# Patient Record
Sex: Female | Born: 1975
Health system: Southern US, Community
[De-identification: ages and names within clinical notes are randomized; demographics above are authoritative.]

## PROBLEM LIST (undated history)

## (undated) DIAGNOSIS — F909 Attention-deficit hyperactivity disorder, unspecified type: Secondary | ICD-10-CM

## (undated) DIAGNOSIS — R569 Unspecified convulsions: Secondary | ICD-10-CM

## (undated) DIAGNOSIS — F1010 Alcohol abuse, uncomplicated: Secondary | ICD-10-CM

## (undated) DIAGNOSIS — Z8639 Personal history of other endocrine, nutritional and metabolic disease: Secondary | ICD-10-CM

## (undated) DIAGNOSIS — E119 Type 2 diabetes mellitus without complications: Secondary | ICD-10-CM

## (undated) DIAGNOSIS — R51 Headache: Secondary | ICD-10-CM

## (undated) DIAGNOSIS — N970 Female infertility associated with anovulation: Secondary | ICD-10-CM

## (undated) DIAGNOSIS — R519 Headache, unspecified: Secondary | ICD-10-CM

## (undated) DIAGNOSIS — Z8669 Personal history of other diseases of the nervous system and sense organs: Secondary | ICD-10-CM

## (undated) DIAGNOSIS — O2620 Pregnancy care for patient with recurrent pregnancy loss, unspecified trimester: Secondary | ICD-10-CM

## (undated) DIAGNOSIS — Z5189 Encounter for other specified aftercare: Secondary | ICD-10-CM

## (undated) DIAGNOSIS — F419 Anxiety disorder, unspecified: Secondary | ICD-10-CM

## (undated) DIAGNOSIS — F32A Depression, unspecified: Secondary | ICD-10-CM

## (undated) DIAGNOSIS — G40909 Epilepsy, unspecified, not intractable, without status epilepticus: Secondary | ICD-10-CM

## (undated) DIAGNOSIS — E669 Obesity, unspecified: Secondary | ICD-10-CM

## (undated) DIAGNOSIS — T8859XA Other complications of anesthesia, initial encounter: Secondary | ICD-10-CM

## (undated) DIAGNOSIS — R109 Unspecified abdominal pain: Secondary | ICD-10-CM

## (undated) DIAGNOSIS — D649 Anemia, unspecified: Secondary | ICD-10-CM

## (undated) DIAGNOSIS — E039 Hypothyroidism, unspecified: Secondary | ICD-10-CM

## (undated) DIAGNOSIS — N390 Urinary tract infection, site not specified: Secondary | ICD-10-CM

## (undated) DIAGNOSIS — N96 Recurrent pregnancy loss: Secondary | ICD-10-CM

## (undated) DIAGNOSIS — T7840XA Allergy, unspecified, initial encounter: Secondary | ICD-10-CM

## (undated) DIAGNOSIS — J45909 Unspecified asthma, uncomplicated: Secondary | ICD-10-CM

## (undated) DIAGNOSIS — F329 Major depressive disorder, single episode, unspecified: Secondary | ICD-10-CM

## (undated) HISTORY — DX: Unspecified abdominal pain: R10.9

## (undated) HISTORY — PX: WISDOM TOOTH EXTRACTION: SHX21

## (undated) HISTORY — DX: Headache: R51

## (undated) HISTORY — DX: Obesity, unspecified: E66.9

## (undated) HISTORY — DX: Personal history of other endocrine, nutritional and metabolic disease: Z86.39

## (undated) HISTORY — DX: Pregnancy care for patient with recurrent pregnancy loss, unspecified trimester: O26.20

## (undated) HISTORY — DX: Alcohol abuse, uncomplicated: F10.10

## (undated) HISTORY — DX: Allergy, unspecified, initial encounter: T78.40XA

## (undated) HISTORY — DX: Anxiety disorder, unspecified: F41.9

## (undated) HISTORY — DX: Unspecified convulsions: R56.9

## (undated) HISTORY — DX: Encounter for other specified aftercare: Z51.89

## (undated) HISTORY — DX: Major depressive disorder, single episode, unspecified: F32.9

## (undated) HISTORY — PX: DILATION AND CURETTAGE OF UTERUS: SHX78

## (undated) HISTORY — DX: Recurrent pregnancy loss: N96

## (undated) HISTORY — DX: Hypothyroidism, unspecified: E03.9

## (undated) HISTORY — DX: Female infertility associated with anovulation: N97.0

## (undated) HISTORY — DX: Depression, unspecified: F32.A

## (undated) HISTORY — DX: Urinary tract infection, site not specified: N39.0

---

## 2008-08-10 ENCOUNTER — Other Ambulatory Visit: Admission: RE | Admit: 2008-08-10 | Discharge: 2008-08-10 | Payer: Self-pay | Admitting: Obstetrics & Gynecology

## 2008-09-07 ENCOUNTER — Ambulatory Visit (HOSPITAL_COMMUNITY): Admission: RE | Admit: 2008-09-07 | Discharge: 2008-09-07 | Payer: Self-pay | Admitting: *Deleted

## 2010-02-08 ENCOUNTER — Ambulatory Visit (HOSPITAL_COMMUNITY): Admission: RE | Admit: 2010-02-08 | Discharge: 2010-02-08 | Payer: Self-pay | Admitting: Obstetrics and Gynecology

## 2010-06-14 ENCOUNTER — Ambulatory Visit (HOSPITAL_COMMUNITY): Payer: Self-pay | Admitting: Psychiatry

## 2010-06-15 ENCOUNTER — Ambulatory Visit (HOSPITAL_COMMUNITY): Payer: Self-pay | Admitting: Licensed Clinical Social Worker

## 2010-06-25 HISTORY — PX: GASTRIC BYPASS: SHX52

## 2010-06-26 ENCOUNTER — Ambulatory Visit (HOSPITAL_COMMUNITY): Payer: Self-pay | Admitting: Licensed Clinical Social Worker

## 2010-06-28 ENCOUNTER — Ambulatory Visit (HOSPITAL_COMMUNITY)
Admission: RE | Admit: 2010-06-28 | Discharge: 2010-06-28 | Payer: Self-pay | Source: Home / Self Care | Attending: Psychiatry | Admitting: Psychiatry

## 2010-07-06 ENCOUNTER — Ambulatory Visit (HOSPITAL_COMMUNITY)
Admission: RE | Admit: 2010-07-06 | Discharge: 2010-07-06 | Payer: Self-pay | Source: Home / Self Care | Attending: Licensed Clinical Social Worker | Admitting: Licensed Clinical Social Worker

## 2010-07-13 ENCOUNTER — Ambulatory Visit (HOSPITAL_COMMUNITY)
Admission: RE | Admit: 2010-07-13 | Discharge: 2010-07-13 | Payer: Self-pay | Source: Home / Self Care | Attending: Licensed Clinical Social Worker | Admitting: Licensed Clinical Social Worker

## 2010-07-20 ENCOUNTER — Ambulatory Visit (HOSPITAL_COMMUNITY)
Admission: RE | Admit: 2010-07-20 | Discharge: 2010-07-20 | Payer: Self-pay | Source: Home / Self Care | Attending: Licensed Clinical Social Worker | Admitting: Licensed Clinical Social Worker

## 2010-07-27 ENCOUNTER — Ambulatory Visit (HOSPITAL_COMMUNITY): Admit: 2010-07-27 | Payer: Self-pay | Admitting: Licensed Clinical Social Worker

## 2010-07-27 ENCOUNTER — Encounter (HOSPITAL_COMMUNITY): Payer: 59 | Admitting: Licensed Clinical Social Worker

## 2010-07-27 DIAGNOSIS — F332 Major depressive disorder, recurrent severe without psychotic features: Secondary | ICD-10-CM

## 2010-07-31 ENCOUNTER — Encounter (HOSPITAL_COMMUNITY): Payer: 59 | Admitting: Psychiatry

## 2010-07-31 DIAGNOSIS — F331 Major depressive disorder, recurrent, moderate: Secondary | ICD-10-CM

## 2010-08-03 ENCOUNTER — Encounter (HOSPITAL_COMMUNITY): Payer: 59 | Admitting: Licensed Clinical Social Worker

## 2010-08-03 DIAGNOSIS — F332 Major depressive disorder, recurrent severe without psychotic features: Secondary | ICD-10-CM

## 2010-08-10 ENCOUNTER — Encounter (HOSPITAL_COMMUNITY): Payer: 59 | Admitting: Licensed Clinical Social Worker

## 2010-08-10 DIAGNOSIS — F332 Major depressive disorder, recurrent severe without psychotic features: Secondary | ICD-10-CM

## 2010-08-18 ENCOUNTER — Encounter (HOSPITAL_COMMUNITY): Payer: 59 | Admitting: Licensed Clinical Social Worker

## 2010-08-18 DIAGNOSIS — F332 Major depressive disorder, recurrent severe without psychotic features: Secondary | ICD-10-CM

## 2010-08-21 ENCOUNTER — Encounter (HOSPITAL_COMMUNITY): Payer: 59 | Admitting: Psychiatry

## 2010-08-28 ENCOUNTER — Encounter (HOSPITAL_COMMUNITY): Payer: 59 | Admitting: Psychiatry

## 2010-08-28 DIAGNOSIS — F331 Major depressive disorder, recurrent, moderate: Secondary | ICD-10-CM

## 2010-08-31 ENCOUNTER — Encounter (HOSPITAL_COMMUNITY): Payer: 59 | Admitting: Licensed Clinical Social Worker

## 2010-08-31 DIAGNOSIS — F332 Major depressive disorder, recurrent severe without psychotic features: Secondary | ICD-10-CM

## 2010-09-08 ENCOUNTER — Encounter (HOSPITAL_COMMUNITY): Payer: 59 | Admitting: Licensed Clinical Social Worker

## 2010-09-08 DIAGNOSIS — F332 Major depressive disorder, recurrent severe without psychotic features: Secondary | ICD-10-CM

## 2010-09-11 ENCOUNTER — Encounter (HOSPITAL_COMMUNITY): Payer: 59 | Admitting: Psychiatry

## 2010-09-14 ENCOUNTER — Encounter (HOSPITAL_COMMUNITY): Payer: 59 | Admitting: Licensed Clinical Social Worker

## 2010-09-14 ENCOUNTER — Other Ambulatory Visit: Payer: Self-pay | Admitting: Neurology

## 2010-09-14 DIAGNOSIS — G40802 Other epilepsy, not intractable, without status epilepticus: Secondary | ICD-10-CM

## 2010-09-14 DIAGNOSIS — F332 Major depressive disorder, recurrent severe without psychotic features: Secondary | ICD-10-CM

## 2010-09-18 ENCOUNTER — Other Ambulatory Visit: Payer: 59

## 2010-09-21 ENCOUNTER — Encounter (HOSPITAL_COMMUNITY): Payer: 59 | Admitting: Licensed Clinical Social Worker

## 2010-09-21 DIAGNOSIS — F332 Major depressive disorder, recurrent severe without psychotic features: Secondary | ICD-10-CM

## 2010-09-25 ENCOUNTER — Encounter (HOSPITAL_COMMUNITY): Payer: 59 | Admitting: Psychiatry

## 2010-09-25 DIAGNOSIS — F332 Major depressive disorder, recurrent severe without psychotic features: Secondary | ICD-10-CM

## 2010-09-29 ENCOUNTER — Encounter (HOSPITAL_COMMUNITY): Payer: 59 | Admitting: Licensed Clinical Social Worker

## 2010-10-02 ENCOUNTER — Ambulatory Visit
Admission: RE | Admit: 2010-10-02 | Discharge: 2010-10-02 | Disposition: A | Discharge status: Home / Self Care | Payer: 59 | Source: Ambulatory Visit | Attending: Neurology | Admitting: Neurology

## 2010-10-02 DIAGNOSIS — G40802 Other epilepsy, not intractable, without status epilepticus: Secondary | ICD-10-CM

## 2010-10-02 MED ORDER — GADOBENATE DIMEGLUMINE 529 MG/ML IV SOLN: 20.0000 mL | Freq: Once | INTRAVENOUS | Status: AC | PRN

## 2010-10-06 ENCOUNTER — Encounter (HOSPITAL_COMMUNITY): Payer: 59 | Admitting: Licensed Clinical Social Worker

## 2010-10-06 DIAGNOSIS — F332 Major depressive disorder, recurrent severe without psychotic features: Secondary | ICD-10-CM

## 2010-10-12 ENCOUNTER — Encounter (HOSPITAL_COMMUNITY): Payer: 59 | Admitting: Licensed Clinical Social Worker

## 2010-10-12 DIAGNOSIS — F332 Major depressive disorder, recurrent severe without psychotic features: Secondary | ICD-10-CM

## 2010-10-19 ENCOUNTER — Encounter (HOSPITAL_COMMUNITY): Payer: 59 | Admitting: Licensed Clinical Social Worker

## 2010-10-19 DIAGNOSIS — F332 Major depressive disorder, recurrent severe without psychotic features: Secondary | ICD-10-CM

## 2010-10-23 ENCOUNTER — Encounter (HOSPITAL_COMMUNITY): Payer: 59 | Admitting: Psychiatry

## 2010-10-26 ENCOUNTER — Encounter (HOSPITAL_COMMUNITY): Payer: 59 | Admitting: Licensed Clinical Social Worker

## 2010-10-26 DIAGNOSIS — F332 Major depressive disorder, recurrent severe without psychotic features: Secondary | ICD-10-CM

## 2010-10-30 ENCOUNTER — Encounter (HOSPITAL_COMMUNITY): Payer: 59 | Admitting: Psychiatry

## 2010-10-30 DIAGNOSIS — F331 Major depressive disorder, recurrent, moderate: Secondary | ICD-10-CM

## 2010-11-03 ENCOUNTER — Encounter (HOSPITAL_COMMUNITY): Payer: 59 | Admitting: Licensed Clinical Social Worker

## 2010-11-06 ENCOUNTER — Encounter (HOSPITAL_COMMUNITY): Payer: 59 | Admitting: Licensed Clinical Social Worker

## 2010-11-06 DIAGNOSIS — F332 Major depressive disorder, recurrent severe without psychotic features: Secondary | ICD-10-CM

## 2010-11-09 ENCOUNTER — Encounter (HOSPITAL_COMMUNITY): Payer: 59 | Admitting: Licensed Clinical Social Worker

## 2010-11-09 DIAGNOSIS — F332 Major depressive disorder, recurrent severe without psychotic features: Secondary | ICD-10-CM

## 2010-11-10 NOTE — Letter (Signed)
September 08, 2008    Faylene Kurtz, M.D.  [address]  [place] [address]   RE:   [name], [name]  MR#   [id number]  ACC#        [id number]   MFM CONSULTATION REPORT   Dear Dr. Russella Dar:   I first want to thank you for referring your patient, Lisa [name], for  a pre-pregnancy consultation secondary to her history of epilepsy.  As  you know, Lisa [name] is a 35 year old gravida [name], Lisa [name] who has  had epilepsy since 5.  My information about her epilepsy diagnosis is  through the maternal history and that I did not have any of her previous  neurology notes.  It is my understanding that she has not been closely  followed by a neurologist for a number of years due to the fact that her  seizure disorder has been very well controlled.  She was diagnosed with  grand mal and petit mal seizures back in 1992 when she was a high school  student and has been on Depakote and has been well controlled since that  time.  The patient reports that she has had only 1 seizure since 2004  but has not been tried off her Depakote.  She would like to undertake a  pregnancy within the next year and is presenting to obtain information  considering the use of Depakote versus other antiepileptic medications  throughout the pregnancy.   PAST MEDICAL HISTORY:  1. Epilepsy as outlined above.  [name]. Hypothyroidism, currently on Synthroid.  3. Borderline diabetes.   The patient has a strong family history of diabetes, randomly checks her  blood sugars and has noted occasional random blood sugars that have been  elevated.   Epilepsy:  1. I had a long discussion with the patient concerning the general      management of patients with epilepsy during pregnancy.      Specifically, I talked about the use of Depakote and the risk of      Depakote use during the pregnancies.  Specifically, I discussed the      1-[name]% chance of a neural tube defect in women taking valproic acid      during the pregnancy.  In  addition, I discussed recent data that      shows that there could be decreased cognitive function in children      whose mothers took Depakote throughout the pregnancy.  Therefore,      due to the fact that the patient has not had frequent seizures, I      think it would be advisable either to try her off her all      antiepileptic medications and/or switch her to a different      antiepileptic medication prior to conceiving.  It is of note that      she currently does take 4 mg of folic acid which is a recommended      dose to prevent neural tube defects in women taking valproic acid.      I was reassuring to her that if the seizure disorder is well      controlled, past majority of women with epilepsy have successful      pregnancy outcome.  [name]. Hypothyroidism.  She appears to be well replaced on her Synthroid      therapy, and I discussed the recommendation to continue the      Synthroid during the pregnancy.  3. Borderline diabetes.  It is my understanding that she is attempting      weight loss and increased exercise, in attempt to better control      her blood sugars.  I did arrange for her to fax her blood sugar      records to [place], so that we can look at those to ensure that she has      good glycemia control prior to undertaking any pregnancy.   Due to my recommendations concerning switching her antiepileptic  medications, I am referring her to Dr. Rolene Arbour in the Department of  Neurology at [facility] for management of her antiepileptic medication  and for consideration of either trying her off of all antiepileptics or  changing her to a different antiepileptic medication.   Thank you for referring Lisa [name] to me for a prenatal consultation  concerning her maternal history of epilepsy.  If you have any questions  concerning my recommendations, please feel free to contact me.   Sincerely,   Sincerely,      Felipa Eth, MD      DCM/MEDQ  D:  09/08/2008  T:   09/09/2008  Job:  [id number]   cc:   M. Leda Quail, MD  Fax: [phone]

## 2010-11-15 ENCOUNTER — Encounter (HOSPITAL_COMMUNITY): Payer: 59 | Admitting: Licensed Clinical Social Worker

## 2010-11-17 ENCOUNTER — Encounter (HOSPITAL_COMMUNITY): Payer: 59 | Admitting: Licensed Clinical Social Worker

## 2010-11-23 ENCOUNTER — Encounter (HOSPITAL_COMMUNITY): Payer: 59 | Admitting: Licensed Clinical Social Worker

## 2010-11-23 DIAGNOSIS — F332 Major depressive disorder, recurrent severe without psychotic features: Secondary | ICD-10-CM

## 2010-11-30 ENCOUNTER — Encounter (HOSPITAL_BASED_OUTPATIENT_CLINIC_OR_DEPARTMENT_OTHER): Payer: 59 | Admitting: Licensed Clinical Social Worker

## 2010-11-30 DIAGNOSIS — F332 Major depressive disorder, recurrent severe without psychotic features: Secondary | ICD-10-CM

## 2010-12-07 ENCOUNTER — Encounter (HOSPITAL_COMMUNITY): Payer: 59 | Admitting: Licensed Clinical Social Worker

## 2010-12-07 DIAGNOSIS — F332 Major depressive disorder, recurrent severe without psychotic features: Secondary | ICD-10-CM

## 2010-12-11 ENCOUNTER — Encounter (HOSPITAL_COMMUNITY): Payer: 59 | Admitting: Psychiatry

## 2010-12-14 ENCOUNTER — Encounter (HOSPITAL_COMMUNITY): Payer: 59 | Admitting: Licensed Clinical Social Worker

## 2010-12-14 DIAGNOSIS — F332 Major depressive disorder, recurrent severe without psychotic features: Secondary | ICD-10-CM

## 2010-12-21 ENCOUNTER — Encounter (HOSPITAL_COMMUNITY): Payer: 59 | Admitting: Licensed Clinical Social Worker

## 2010-12-21 DIAGNOSIS — F332 Major depressive disorder, recurrent severe without psychotic features: Secondary | ICD-10-CM

## 2010-12-28 ENCOUNTER — Encounter (HOSPITAL_COMMUNITY): Payer: 59 | Admitting: Licensed Clinical Social Worker

## 2010-12-28 DIAGNOSIS — F332 Major depressive disorder, recurrent severe without psychotic features: Secondary | ICD-10-CM

## 2011-01-04 ENCOUNTER — Encounter (HOSPITAL_COMMUNITY): Payer: 59 | Admitting: Licensed Clinical Social Worker

## 2011-01-04 DIAGNOSIS — F332 Major depressive disorder, recurrent severe without psychotic features: Secondary | ICD-10-CM

## 2011-01-11 ENCOUNTER — Encounter (HOSPITAL_COMMUNITY): Payer: 59 | Admitting: Licensed Clinical Social Worker

## 2011-01-11 DIAGNOSIS — F332 Major depressive disorder, recurrent severe without psychotic features: Secondary | ICD-10-CM

## 2011-01-18 ENCOUNTER — Encounter (HOSPITAL_COMMUNITY): Payer: 59 | Admitting: Licensed Clinical Social Worker

## 2011-01-18 DIAGNOSIS — F332 Major depressive disorder, recurrent severe without psychotic features: Secondary | ICD-10-CM

## 2011-01-25 ENCOUNTER — Encounter (HOSPITAL_COMMUNITY): Payer: 59 | Admitting: Licensed Clinical Social Worker

## 2011-01-25 DIAGNOSIS — F39 Unspecified mood [affective] disorder: Secondary | ICD-10-CM

## 2011-02-01 ENCOUNTER — Encounter (HOSPITAL_COMMUNITY): Payer: 59 | Admitting: Licensed Clinical Social Worker

## 2011-02-01 DIAGNOSIS — F332 Major depressive disorder, recurrent severe without psychotic features: Secondary | ICD-10-CM

## 2011-02-08 ENCOUNTER — Encounter (HOSPITAL_COMMUNITY): Payer: 59 | Admitting: Licensed Clinical Social Worker

## 2011-02-08 DIAGNOSIS — F332 Major depressive disorder, recurrent severe without psychotic features: Secondary | ICD-10-CM

## 2011-02-15 ENCOUNTER — Encounter (HOSPITAL_COMMUNITY): Payer: 59 | Admitting: Licensed Clinical Social Worker

## 2011-02-15 DIAGNOSIS — F332 Major depressive disorder, recurrent severe without psychotic features: Secondary | ICD-10-CM

## 2011-02-22 ENCOUNTER — Encounter (HOSPITAL_COMMUNITY): Payer: 59 | Admitting: Licensed Clinical Social Worker

## 2011-02-22 DIAGNOSIS — F332 Major depressive disorder, recurrent severe without psychotic features: Secondary | ICD-10-CM

## 2011-03-01 ENCOUNTER — Encounter (HOSPITAL_COMMUNITY): Payer: 59 | Admitting: Licensed Clinical Social Worker

## 2011-03-01 DIAGNOSIS — F332 Major depressive disorder, recurrent severe without psychotic features: Secondary | ICD-10-CM

## 2011-03-08 ENCOUNTER — Encounter (HOSPITAL_COMMUNITY): Payer: 59 | Admitting: Licensed Clinical Social Worker

## 2011-03-08 DIAGNOSIS — F332 Major depressive disorder, recurrent severe without psychotic features: Secondary | ICD-10-CM

## 2011-03-22 ENCOUNTER — Encounter (HOSPITAL_COMMUNITY): Payer: 59 | Admitting: Licensed Clinical Social Worker

## 2011-03-29 ENCOUNTER — Encounter (HOSPITAL_COMMUNITY): Payer: 59 | Admitting: Licensed Clinical Social Worker

## 2011-03-29 DIAGNOSIS — F332 Major depressive disorder, recurrent severe without psychotic features: Secondary | ICD-10-CM

## 2011-04-04 ENCOUNTER — Encounter (HOSPITAL_COMMUNITY): Payer: 59 | Admitting: Licensed Clinical Social Worker

## 2011-04-04 DIAGNOSIS — F332 Major depressive disorder, recurrent severe without psychotic features: Secondary | ICD-10-CM

## 2011-04-12 ENCOUNTER — Encounter (HOSPITAL_COMMUNITY): Payer: 59 | Admitting: Licensed Clinical Social Worker

## 2011-04-12 DIAGNOSIS — F332 Major depressive disorder, recurrent severe without psychotic features: Secondary | ICD-10-CM

## 2011-04-17 ENCOUNTER — Encounter (HOSPITAL_COMMUNITY): Payer: 59 | Admitting: Licensed Clinical Social Worker

## 2011-04-17 DIAGNOSIS — F332 Major depressive disorder, recurrent severe without psychotic features: Secondary | ICD-10-CM

## 2011-04-19 ENCOUNTER — Encounter (HOSPITAL_COMMUNITY): Payer: 59 | Admitting: Licensed Clinical Social Worker

## 2011-04-20 ENCOUNTER — Encounter (HOSPITAL_COMMUNITY): Payer: 59 | Admitting: Licensed Clinical Social Worker

## 2011-04-26 ENCOUNTER — Encounter (HOSPITAL_COMMUNITY): Payer: 59 | Admitting: Licensed Clinical Social Worker

## 2011-04-26 DIAGNOSIS — F332 Major depressive disorder, recurrent severe without psychotic features: Secondary | ICD-10-CM

## 2011-04-28 ENCOUNTER — Other Ambulatory Visit (HOSPITAL_COMMUNITY): Payer: Self-pay

## 2011-04-30 ENCOUNTER — Ambulatory Visit (HOSPITAL_COMMUNITY): Payer: 59 | Admitting: Psychiatry

## 2011-04-30 ENCOUNTER — Encounter (HOSPITAL_COMMUNITY): Payer: Self-pay | Admitting: Psychiatry

## 2011-04-30 DIAGNOSIS — F331 Major depressive disorder, recurrent, moderate: Secondary | ICD-10-CM

## 2011-04-30 MED ORDER — FLUOXETINE HCL 10 MG PO CAPS: ORAL_CAPSULE | ORAL | Status: DC

## 2011-04-30 MED ORDER — DIAZEPAM 5 MG PO TABS: ORAL_TABLET | ORAL | Status: DC

## 2011-04-30 NOTE — Progress Notes (Signed)
Patient came today for her appointment. She was last seen in May 2012. He had gastric surgery and she lost 60 pounds. Recently she stopped feeling depressed anxious with decreased energy. She's been more isolated withdrawn and reported anhedonia and some crying spells. Patient reported usually getting more depressed in the winter and fall season. She like to restart antidepressant. She is currently taking no antidepressant. Is taking little from difficulty however she is not confirmed about the dosage in April her headache is still there but hoping with increase Lamictal they get better. She is also VALIUM and experiencing some time panic attack and like to go back on Valium. Patient is calm cooperative pleasant and maintains good eye contact she endorses mood is depressed and her affect was constricted. She denies any active or passive suicidal thoughts. There is no psychosis present. Her attention and concentration are okay. She denies any side effects of the medication. She is alert and oriented x3 and her insight judgment impulse control are okay. Assessment Maj. depressive disorder recurrent Plan restart Prozac 10 mg daily with slow titration to 20 mg daily in one week also provide 30 tablets of Valium 5 mg for severe anxiety and panic attack. She'll continue Lamictal from the neurologist. I have explained the risks and benefits of medication in detail. She will continue to see counselor on a regular basis for coping and social skills. I will see her again in 3 weeks. Time spent 30 minutes.

## 2011-05-03 ENCOUNTER — Encounter (HOSPITAL_COMMUNITY): Payer: Self-pay | Admitting: Licensed Clinical Social Worker

## 2011-05-03 ENCOUNTER — Ambulatory Visit (HOSPITAL_COMMUNITY): Payer: 59 | Admitting: Licensed Clinical Social Worker

## 2011-05-03 DIAGNOSIS — F332 Major depressive disorder, recurrent severe without psychotic features: Secondary | ICD-10-CM

## 2011-05-03 DIAGNOSIS — F4310 Post-traumatic stress disorder, unspecified: Secondary | ICD-10-CM

## 2011-05-04 NOTE — Progress Notes (Signed)
   THERAPIST PROGRESS NOTE  Session Time: 3:00pm-3:50pm  Participation Level: Active  Behavioral Response: Well GroomedAlertAnxious, Depressed and Irritable  Type of Therapy: Individual Therapy  Treatment Goals addressed: Anxiety, Coping and Diagnosis: depression  Interventions: CBT, Strength-based, Supportive and Reframing  Summary: Lisa [name] is a 35 y.o. female who presents with depressed mood and flat affect. She endorses and appears exhausted and reports continued insomnia. She has woken up twice over the past week with flashbacks and is unable to go back to sleep after this happens. 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 She repots taking a break from working intensely at her job and on occasion has left work early to spend time with her dog at the park. Patent demonstrates improvement in her willingness and ability to focus on life balance. She reports poor motivation and has not been exercising due to feelings of vulnerability, but she has focused on a solution for this which involves her father and brother accompanying her to the gym. Stress in her marriage is low as her husband continues to make changes in his behavior. As a result patient is more relaxed at home. Her weight has now reached a plateau due to no exercise. Her appetite is wnr.   Suicidal/Homicidal: Nowithout intent/plan  Therapist Response: Assisted patient with the expression of her feelings of depression, frustration about her medication and anger towards some members of her family. Her negative self talk is strong today when she describes her frustration with her irrational thoughts. She calls herself "stupid" for having irrational thoughts and uses this language as a means to "snap herself out of it". Dicussed the negative consequences related to using disparaging negative self statement. Patient is  open to feedback, but remains cynical during the process. Used CBT to assist patient with the identification of her negative distortions and irrational thoughts. Encouraged patient to verbalize alternative and factual responses which challenge her distortions.  Reviewed patients self care plan. Assessed her progress related to self care. Patient's self care needs improvement. Recommend proper diet, regular exercise, socialization and recreation. Patient denies suicidal and homicidal ideation, intent or plan.  Plan: Return again in 1 weeks.  Diagnosis: Axis I: Major Depression, Rec and PTSD    Axis II: No diagnosis    Nicole Hafley, LCSW 05/04/2011

## 2011-05-10 ENCOUNTER — Ambulatory Visit (HOSPITAL_COMMUNITY): Payer: 59 | Admitting: Licensed Clinical Social Worker

## 2011-05-10 ENCOUNTER — Encounter (HOSPITAL_COMMUNITY): Payer: Self-pay | Admitting: Licensed Clinical Social Worker

## 2011-05-10 DIAGNOSIS — F332 Major depressive disorder, recurrent severe without psychotic features: Secondary | ICD-10-CM

## 2011-05-10 DIAGNOSIS — F4310 Post-traumatic stress disorder, unspecified: Secondary | ICD-10-CM
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
## 2011-05-23 ENCOUNTER — Ambulatory Visit (HOSPITAL_COMMUNITY): Payer: 59 | Admitting: Psychiatry

## 2011-05-30 ENCOUNTER — Ambulatory Visit (HOSPITAL_COMMUNITY): Payer: 59 | Admitting: Psychiatry

## 2011-06-04 ENCOUNTER — Ambulatory Visit (HOSPITAL_COMMUNITY): Payer: 59 | Admitting: Psychiatry

## 2011-06-04 DIAGNOSIS — F331 Major depressive disorder, recurrent, moderate: Secondary | ICD-10-CM

## 2011-06-04 MED ORDER — DIAZEPAM 5 MG PO TABS: ORAL_TABLET | ORAL | Status: DC

## 2011-06-04 MED ORDER — FLUOXETINE HCL 10 MG PO CAPS: ORAL_CAPSULE | ORAL | Status: DC

## 2011-06-04 NOTE — Progress Notes (Signed)
Patient came today for her appointment. She is taking Prozac 20 mg and reported no side effects. She continued to endorse depression and anxiety and hoping increased was a Prozac to help her. She is sleeping better and take Valium 5 mg half to one tablet as needed. She still has residual crying spells Recently she has upper respiratory infection taking antibiotic. She reached a plateau on her weight loss and stays on 195 pounds. She's been more isolated withdrawn and reported anhedonia and some crying spells.  Mental status examination  Patient is calm cooperative pleasant and maintains good eye contact she appears tired due to cold symptoms and taking antibiotic. she endorses mood is depressed and her affect was constricted. She denies any active or passive suicidal thoughts. There is no psychosis present. Her attention and concentration are okay. She denies any side effects of the medication. She is alert and oriented x3 and her insight judgment impulse control are okay.  Assessment Maj. depressive disorder recurrent  Plan  I will increase her Prozac to 30 mg daily and continue to provide Valium 5 mg half to 1 tablet as needed for anxiety. If patient continued shows limited improvement with the Prozac we will consider Cymbalta on next visit She'll continue Lamictal from the neurologist. She told she is seeing Dr. Pearlean Brownie at [facility] into for neurology and her Lamictal dose is 500 mg. I have explained the risks and benefits of medication in detail. She will continue to see counselor on a regular basis for coping and social skills. I will see her again in 3 weeks.

## 2011-06-28 ENCOUNTER — Ambulatory Visit (HOSPITAL_COMMUNITY): Payer: 59 | Admitting: Licensed Clinical Social Worker

## 2011-07-05 ENCOUNTER — Encounter (HOSPITAL_COMMUNITY): Payer: 59 | Admitting: Licensed Clinical Social Worker

## 2011-07-12 ENCOUNTER — Ambulatory Visit (INDEPENDENT_AMBULATORY_CARE_PROVIDER_SITE_OTHER): Payer: 59 | Admitting: Licensed Clinical Social Worker

## 2011-07-12 ENCOUNTER — Encounter (HOSPITAL_COMMUNITY): Payer: Self-pay | Admitting: Licensed Clinical Social Worker

## 2011-07-12 DIAGNOSIS — F332 Major depressive disorder, recurrent severe without psychotic features: Secondary | ICD-10-CM

## 2011-07-12 DIAGNOSIS — F4310 Post-traumatic stress disorder, unspecified: Secondary | ICD-10-CM

## 2011-07-12 NOTE — Progress Notes (Signed)
   THERAPIST PROGRESS NOTE  Session Time: 4:00pm-4:50pm  Participation Level: Active  Behavioral Response: Well GroomedAlertDepressed, Dysphoric and Hopeless  Type of Therapy: Individual Therapy  Treatment Goals addressed: Anxiety, Communication: with husband, Coping and Diagnosis: depression and PTSD.   Interventions: CBT, Motivational Interviewing, Strength-based and Supportive  Summary: Lisa [name] is a 36 y.o. female who presents with depressed mood and flat affect. This is the first session in two months since this therapist has been out on leave. Patient discussed her visits with her old therapist and reports that they did not make much progress. She reports continued severe depression, continued anxiety with panic attacks and flashbacks. She is uncomfortable with additional weight loss and intentionally stopped loosing weight. She feels vulnerable and exposed already and does not want to increase those feelings by loosing more weight. She hates her job and is being black balled by HR. She describes being rejected for a job before the posting closed. She is hopeless when talking about her job and what she can do about the problem. She continues to exercise, but has discomfort in the gym. Her husband is treating her well, but she continues to hold onto secrets about her past, PTSD symptoms and her discomfort with sex. Her sleep is inconsistent and her appetite is wnl.    Suicidal/Homicidal: Nowithout intent/plan  Therapist Response: Patient's depression, anxiety and PTSD show no improvement over the past month. She is dsyphoric, hopeless and defensive when offered alternative ways of considering a problem. Processed w/pt her struggle to communicate her feelings to her husband. Explored her negative and distorted thoughts, used CBT and reframing to assist with increased insight. Patient is resistant towards the idea of being more honest with her husband. Her thinking is rigid and negative.  Will continue to focus on honesty as a means to decrease her exposure to traumatic events, such as sex. Used CBT to assist patient with the identification of her negative distortions and irrational thoughts. Encouraged patient to verbalize alternative and factual responses which challenge her distortions. Used motivational interviewing to assist and encourage patient through the change process. Explored patients barriers to change. Reviewed patients self care plan. Assessed her progress related to self care. Patient's self care is adequate and can use improvement. Recommend proper diet, regular exercise, socialization and recreation.   Plan: Return again in one weeks.  Diagnosis: Axis I: Major Depression, Recurrent severe and Post Traumatic Stress Disorder    Axis II: Deferred    Emmalina Espericueta, LCSW 07/12/2011

## 2011-07-13 ENCOUNTER — Ambulatory Visit (INDEPENDENT_AMBULATORY_CARE_PROVIDER_SITE_OTHER): Payer: 59 | Admitting: Licensed Clinical Social Worker

## 2011-07-13 DIAGNOSIS — F4310 Post-traumatic stress disorder, unspecified: Secondary | ICD-10-CM

## 2011-07-13 DIAGNOSIS — F332 Major depressive disorder, recurrent severe without psychotic features: Secondary | ICD-10-CM

## 2011-07-13 NOTE — Progress Notes (Signed)
   THERAPIST PROGRESS NOTE  Session Time: 2:00-3:15pm  Participation Level: Active  Behavioral Response: Well GroomedLethargicAnxious, Depressed, Dysphoric, Hopeless and Worthless  Type of Therapy: Individual Therapy  Treatment Goals addressed: Anxiety, Coping and Diagnosis: depression, PTSD  Interventions: DBT, Solution Focused, Strength-based and Supportive  Summary: Lisa [name] is a 36 y.o. female who presents with depressed/anxious mood and flat affect. She called to request an appointment today after being seen yesterday. She talks softly and behaves timidly. She reports an increase in anxiety, panic and desire to cut herself after her session yesterday. Discussed her desire to hurt herself and that last night she took 35mg  of Valium to knock herself out because none of her distraction techniques worked. reviewed her techniques and discussed alternative distraction tools. She is fearful of the idea of telling her husband the truth about her flashbacks and PTSD. She speaks negatively about herself, calling her inability to mange her PTSD symptoms "stupid and pathetic". As her anxiety increases, she begins to shake and have a flashback.    Suicidal/Homicidal: Nowithout intent/plan  Therapist Response: Patient is struggling with distraction, management of her depression and anxiety. Explored her negative and distorted thoughts about the results of being honest with her husband. Used CBT to assist patient with the identification of her negative distortions and irrational thoughts. Encouraged patient to verbalize alternative and factual responses which challenge her distortions. Used DBT to assist patient with distraction ideas, increase her mindfulness in session and assigning her homework. Recommend patient purchase the DBT workbook. Assisted patent with problem solving and creating a plan of distraction. Used Mazy the therapy dog at the end of the session to ground patient in reality an assist  her to leave the session. Patient is able to contract for safety.   Plan: Return again in one weeks.  Diagnosis: Axis I: Major Depression, Recurrent severe and Post Traumatic Stress Disorder    Axis II: Deferred    Uchenna Seufert, LCSW 07/13/2011

## 2011-07-16 ENCOUNTER — Ambulatory Visit (INDEPENDENT_AMBULATORY_CARE_PROVIDER_SITE_OTHER): Payer: 59 | Admitting: Psychiatry

## 2011-07-16 DIAGNOSIS — F331 Major depressive disorder, recurrent, moderate: Secondary | ICD-10-CM

## 2011-07-16 MED ORDER — DIAZEPAM 5 MG PO TABS: ORAL_TABLET | ORAL | Status: DC

## 2011-07-16 MED ORDER — FLUOXETINE HCL 40 MG PO CAPS: 40.0000 mg | ORAL_CAPSULE | Freq: Every day | ORAL | Status: DC

## 2011-07-16 NOTE — Progress Notes (Signed)
Patient came today for her appointment. She is now taking Prozac 30 mg and reported no side effects. She has noticed some improvement in her depression and she is less anxious during the day however she continues to have anxiety racing thoughts and panic attack at night. She still requires Valium half to 1 tablet as needed for extreme anxiety. She is wondering if she can try Prozac 40 mg for a better response. She still have some residual crying spells and anhedonia but hopelessness and helplessness but she denies any active or passive suicidal thoughts. She is able to lost some weight and feel happy about it.  She is seeing therapist on a regular basis for increase coping and social skills.   Mental status examination  Patient is calm cooperative pleasant and maintains good eye contact. She appears tired but overall her affect is improved from the past. Her speech is slow but clear and coherent. She denies any active or passive suicidal thoughts. There is no psychosis present. Her attention and concentration are okay. She denies any side effects of the medication. She is alert and oriented x3 and her insight judgment impulse control are okay.  Assessment Maj. depressive disorder recurrent  Plan  I will increase her Prozac to 40 mg daily and continue to provide Valium 5 mg half to 1 tablet as needed for anxiety.  I have explained risks and benefits of medication in detail. She's also taking Lamictal 500 mg from the neurologist. I have explained the risks and benefits of medication in detail. She will continue to see counselor on a regular basis for coping and social skills. I will see her again in 4 weeks.

## 2011-07-19 ENCOUNTER — Encounter (HOSPITAL_COMMUNITY): Payer: Self-pay | Admitting: Licensed Clinical Social Worker

## 2011-07-19 ENCOUNTER — Ambulatory Visit (INDEPENDENT_AMBULATORY_CARE_PROVIDER_SITE_OTHER): Payer: 59 | Admitting: Licensed Clinical Social Worker

## 2011-07-19 DIAGNOSIS — F332 Major depressive disorder, recurrent severe without psychotic features: Secondary | ICD-10-CM

## 2011-07-19 DIAGNOSIS — F4310 Post-traumatic stress disorder, unspecified: Secondary | ICD-10-CM
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
## 2011-07-20 ENCOUNTER — Encounter (HOSPITAL_COMMUNITY): Payer: Self-pay | Admitting: Licensed Clinical Social Worker

## 2011-07-20 ENCOUNTER — Ambulatory Visit (INDEPENDENT_AMBULATORY_CARE_PROVIDER_SITE_OTHER): Payer: 59 | Admitting: Licensed Clinical Social Worker

## 2011-07-20 DIAGNOSIS — F4310 Post-traumatic stress disorder, unspecified: Secondary | ICD-10-CM

## 2011-07-20 DIAGNOSIS — F332 Major depressive disorder, recurrent severe without psychotic features: Secondary | ICD-10-CM
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
## 2011-07-26 ENCOUNTER — Encounter (HOSPITAL_COMMUNITY): Payer: Self-pay | Admitting: Licensed Clinical Social Worker

## 2011-07-26 ENCOUNTER — Ambulatory Visit (INDEPENDENT_AMBULATORY_CARE_PROVIDER_SITE_OTHER): Payer: 59 | Admitting: Licensed Clinical Social Worker

## 2011-07-26 DIAGNOSIS — F332 Major depressive disorder, recurrent severe without psychotic features: Secondary | ICD-10-CM

## 2011-07-26 DIAGNOSIS — F4310 Post-traumatic stress disorder, unspecified: Secondary | ICD-10-CM

## 2011-07-26 NOTE — Progress Notes (Signed)
   THERAPIST PROGRESS NOTE  Session Time: 3:00pm-3:50pm  Participation Level: Active  Behavioral Response: Well GroomedAlertEuthymic  Type of Therapy: Individual Therapy  Treatment Goals addressed: Anxiety, Coping and Diagnosis: depression  Interventions: CBT, Motivational Interviewing and Supportive  Summary: Lisa [name] is a 36 y.o. female who presents with euthymic mood and affect. She reports having a functional split after her session last week and describes seeing a hallway with a coat hanging up which contained her depression and anxiety. She reports having a wonderful four days, without depression or anxiety. She was active, able to sleep 8 hours, able to have sex with her husband and enjoy it and interact with friends and family. This lasted until Tuesday when she reports that her depression and anxiety returned, but not as severe. She met with her previous therapist to discuss her experience and discusses her thoughts on this. Patient did not identify any personality in this experience. She is trying to take the coat off again, but can't figure out how to do this.    Suicidal/Homicidal: Nowithout intent/plan  Therapist Response: Patient is noticeably different today, with an upbeat mood and the ability to laugh. She looks well rested and is wearing makeup. Processed w/pt her feelings about this experience, which she endorses as weird. She is fearful that this therapist will decide not to work with her, because she is too "wierd". Provided feedback and reassurance. Used CBT to assist patient with the identification of her negative distortions and irrational thoughts. Encouraged patient to verbalize alternative and factual responses which challenge her distortions. Used this experience as a means help motivate patient to continue more adaptive behaviors. Used motivational interviewing to assist and encourage patient through the change process. Explored patients barriers to change.  Reviewed patients self care plan. Assessed her progress related to self care. Patient's self care is fair. Recommend proper diet, regular exercise, socialization and recreation.   Plan: Return again in one weeks.  Diagnosis: Axis I: Major Depression, Recurrent severe    Axis II: Deferred    [name], LCSW 07/26/2011

## 2011-08-01 ENCOUNTER — Ambulatory Visit (INDEPENDENT_AMBULATORY_CARE_PROVIDER_SITE_OTHER): Payer: 59 | Admitting: Licensed Clinical Social Worker

## 2011-08-01 ENCOUNTER — Encounter (HOSPITAL_COMMUNITY): Payer: Self-pay | Admitting: Licensed Clinical Social Worker

## 2011-08-01 DIAGNOSIS — F332 Major depressive disorder, recurrent severe without psychotic features: Secondary | ICD-10-CM

## 2011-08-01 DIAGNOSIS — F4310 Post-traumatic stress disorder, unspecified: Secondary | ICD-10-CM
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
## 2011-08-02 ENCOUNTER — Ambulatory Visit (INDEPENDENT_AMBULATORY_CARE_PROVIDER_SITE_OTHER): Payer: 59 | Admitting: Licensed Clinical Social Worker

## 2011-08-02 DIAGNOSIS — F4310 Post-traumatic stress disorder, unspecified: Secondary | ICD-10-CM

## 2011-08-02 DIAGNOSIS — F332 Major depressive disorder, recurrent severe without psychotic features: Secondary | ICD-10-CM

## 2011-08-03 ENCOUNTER — Encounter (HOSPITAL_COMMUNITY): Payer: Self-pay | Admitting: Licensed Clinical Social Worker

## 2011-08-03 NOTE — Progress Notes (Signed)
   THERAPIST PROGRESS NOTE  Session Time: 4:00pm-4:50pm  Participation Level: Active  Behavioral Response: Well GroomedAlertAnxious, Depressed, Hopeless and Irritable  Type of Therapy: Individual Therapy  Treatment Goals addressed: Anger, Anxiety, Coping and Diagnosis: depression  Interventions: CBT, DBT and Supportive  Summary: Lisa [name] is a 36 y.o. female who presents with depressed mood and flat affect. She is difficult to engage and reports not being able to sleep last night at all. She is barely functioning, admits it is a struggle to get out of bed, bath and go to work. When asked how she does this, she states that it is habit. She talks negatively about herself today, focusing on her belief that she is completely worthless, a piece of trash and a waste of space. She did think about what or whom she could put hope in and she believes she can put hope in this therapist and her old therapist. She is anxious, with panic when discusses her trauma. Her sleep and appetite are both poor.     Suicidal/Homicidal: Nowithout intent/plan  Therapist Response: Patients depression is worsening and she remains resistant in session. Discussed the need for a higher level of care based on her need for two sessions a week. Discussed and recommended Psych IOP, which patient argues about how this can be helpful for her since she is too anxious to be around people at this time. Educated patient on the benefits of group, she is unwilling to attend the group. She has not followed through with contacting a new psychiatrist. Told patient she must do this in order to continue treatment. Processed w/ patient her struggle to reconcile the "good" parts of her with the traumatically exposed areas. Used DBT to help develop distress tolerance. Patient struggles to remain focused on developing these skills today. Used motivational interviewing to assist and encourage patient through the change process. Explored patients  barriers to change. Reviewed patients self care plan. Assessed her progress related to self care. Patient's self care is poor. Recommend proper diet, regular exercise, socialization and recreation.   Plan: Return again in one weeks.  Diagnosis: Axis I: Major Depression, Recurrent severe and Post Traumatic Stress Disorder    Axis II: No diagnosis    Lisa Heinle, LCSW 08/03/2011

## 2011-08-09 ENCOUNTER — Ambulatory Visit (HOSPITAL_COMMUNITY): Payer: 59 | Admitting: Licensed Clinical Social Worker

## 2011-08-09 DIAGNOSIS — F332 Major depressive disorder, recurrent severe without psychotic features: Secondary | ICD-10-CM

## 2011-08-09 DIAGNOSIS — F4310 Post-traumatic stress disorder, unspecified: Secondary | ICD-10-CM
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
## 2011-08-10 DIAGNOSIS — R109 Unspecified abdominal pain: Secondary | ICD-10-CM

## 2011-08-10 HISTORY — DX: Unspecified abdominal pain: R10.9

## 2011-08-13 ENCOUNTER — Ambulatory Visit (HOSPITAL_COMMUNITY): Payer: 59 | Admitting: Psychiatry

## 2011-08-14 DIAGNOSIS — N390 Urinary tract infection, site not specified: Secondary | ICD-10-CM

## 2011-08-14 HISTORY — DX: Urinary tract infection, site not specified: N39.0

## 2011-08-16 ENCOUNTER — Ambulatory Visit (INDEPENDENT_AMBULATORY_CARE_PROVIDER_SITE_OTHER): Payer: 59 | Admitting: Licensed Clinical Social Worker

## 2011-08-16 DIAGNOSIS — F4310 Post-traumatic stress disorder, unspecified: Secondary | ICD-10-CM

## 2011-08-16 DIAGNOSIS — F332 Major depressive disorder, recurrent severe without psychotic features: Secondary | ICD-10-CM

## 2011-08-17 ENCOUNTER — Encounter (HOSPITAL_COMMUNITY): Payer: Self-pay | Admitting: Psychiatry

## 2011-08-17 ENCOUNTER — Ambulatory Visit (INDEPENDENT_AMBULATORY_CARE_PROVIDER_SITE_OTHER): Payer: 59 | Admitting: Psychiatry

## 2011-08-17 VITALS — BP 115/69 | HR 81 | Wt 177.0 lb

## 2011-08-17 DIAGNOSIS — F32A Depression, unspecified: Secondary | ICD-10-CM

## 2011-08-17 DIAGNOSIS — F331 Major depressive disorder, recurrent, moderate: Secondary | ICD-10-CM

## 2011-08-17 DIAGNOSIS — F3289 Other specified depressive episodes: Secondary | ICD-10-CM

## 2011-08-17 DIAGNOSIS — F329 Major depressive disorder, single episode, unspecified: Secondary | ICD-10-CM

## 2011-08-17 MED ORDER — DESVENLAFAXINE SUCCINATE ER 50 MG PO TB24: 50.0000 mg | ORAL_TABLET | Freq: Every day | ORAL | Status: DC

## 2011-08-17 MED ORDER — DIAZEPAM 5 MG PO TABS: ORAL_TABLET | ORAL | Status: DC
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
## 2011-08-23 ENCOUNTER — Ambulatory Visit (INDEPENDENT_AMBULATORY_CARE_PROVIDER_SITE_OTHER): Payer: 59 | Admitting: Licensed Clinical Social Worker

## 2011-08-23 DIAGNOSIS — F332 Major depressive disorder, recurrent severe without psychotic features: Secondary | ICD-10-CM

## 2011-08-23 DIAGNOSIS — F4310 Post-traumatic stress disorder, unspecified: Secondary | ICD-10-CM

## 2011-08-23 NOTE — Progress Notes (Deleted)
   THERAPIST PROGRESS NOTE  Session Time: 3:00pm-3:50pm  Participation Level: Active  Behavioral Response: Well GroomedAlertEuthymic  Type of Therapy: Individual Therapy  Treatment Goals addressed: Coping  Interventions: CBT, Strength-based and Supportive  Summary: Lisa [name] is a 36 y.o. female who presents with euthymic mood and affect. She reports feeling back to herself with a distinct improvement in her mood, which she attributes to a session with her old therapist, where she split and work was done to integrate the trauma. She feels good, has some concern and apprehension about her improvement, but is trying to trust the process of integrating the trauma fully. She feels "younger" and is concerned about how this may come across to others, especially at work. She is uncomfortable with her body and is trying to adjust to the changes from her weight loss. She is hopeful about her future and about becoming pregnant. Her sleep and appetite are wnl.   Suicidal/Homicidal: Nowithout intent/plan  Therapist Response: Processed w/pt her feelings of this dramatic change and her understanding of what has happened. Explored her discomfort with her body and helped her develop insight into her negative self talk around this issue. Used CBT to assist patient with the identification of her negative distortions and irrational thoughts. Encouraged patient to verbalize alternative and factual responses which challenge her distortions. Reviewed patients self care plan. Assessed her progress related to self care. Patient's self care is good. Recommend proper diet, regular exercise, socialization and recreation.   Plan: Return again in one weeks.  Diagnosis: Axis I: Major Depression, Recurrent severe and Post Traumatic Stress Disorder    Axis II: No diagnosis    Olof Marcil, LCSW 08/23/2011

## 2011-08-29 ENCOUNTER — Ambulatory Visit (HOSPITAL_COMMUNITY): Payer: Self-pay | Admitting: Licensed Clinical Social Worker

## 2011-08-30 ENCOUNTER — Ambulatory Visit (INDEPENDENT_AMBULATORY_CARE_PROVIDER_SITE_OTHER): Payer: 59 | Admitting: Licensed Clinical Social Worker

## 2011-08-30 ENCOUNTER — Ambulatory Visit (HOSPITAL_COMMUNITY): Payer: 59 | Admitting: Licensed Clinical Social Worker

## 2011-08-30 DIAGNOSIS — F332 Major depressive disorder, recurrent severe without psychotic features: Secondary | ICD-10-CM

## 2011-08-31 ENCOUNTER — Telehealth (HOSPITAL_COMMUNITY): Payer: Self-pay | Admitting: Licensed Clinical Social Worker
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
## 2011-08-31 NOTE — Progress Notes (Deleted)
   THERAPIST PROGRESS NOTE  Session Time: 3:00pm-3:50pm  Participation Level: Active  Behavioral Response: Well GroomedAlertDepressed and Irritable  Type of Therapy: Individual Therapy  Treatment Goals addressed: Coping  Interventions: CBT, DBT, Strength-based and Supportive  Summary: Lisa [name] is a 36 y.o. female who presents with depressed mood and irritable affect. She reports a return of her depression and anxiety,with a high degree of irritability. She reports mood lability and confusion over the lability. She is angry and highly irritable at work and at home. She is upset with her husband for his current depression and reaction to "small things" which she finds not worthy to be upset about. She is angry with her mothers constant attempts to convince patient that she should not have children because of her depression and her husbands temperament. She did confront her mother about how this feels, but has not confidence that this will change her mothers future behavior. She is in a "dark place" and endorses suicidal thoughts over the past week and admits to taking too much valium, but cannot tell this therapist how much. Her trigger for suicidal thought and intent is her ongoing depression and her hopelessness that it will "never end". She is able to contract for safety today and is able to discuss deterrents which are her family and her husband. She admits that she has not been honest with this therapist or her prior therapist about her suicidal ideation, intent or plan out of fear that she would be hospitalized and that we would be disappointed in her. Discussed hospitalization, which patient is strongly against, but accepts that if this therapist needed to hospitalize her, should would comply. At this time, she agrees to contact this therapist, her old therapist if this therapist is not available and go to the nearest emergency room. Plan is to touch base with patient tomorrow to assess  suicidality. Her sleep is poor and her appetite is wnl.  Patient wants to use a plan to contact this office if she needs an additional session by using key words which would alert this therapist to the severity of her need. She will use the work "panic" if she is anxious and could use a session, but can go without if there is no time. She will use the word "touchstone" to communicate that she is in a dangerous and dark place.    Suicidal/Homicidal: Nowithout intent/plan  Therapist Response: Processed with patient her feeling of depression, frustration and irritability. Provided feedback and encouragement. Processed her cognitive distortions leading to hopelessness and helplessness. Explored alternative ways of releasing her stress other than hurting herself, such as exercising, hiking and walking her dog. Used CBT to assist patient with the identification of her negative distortions and irrational thoughts. Encouraged patient to verbalize alternative and factual responses which challenge her distortions. Used DBT to help patient build distress tolerance skills. Reviewed patients self care plan. Assessed her progress related to self care. Patient's self care is fair. Recommend proper diet, regular exercise, socialization and recreation.  Will contact patient tomorrow and check in to assess safety. Patient is able to clearly communicate her safety plan and commits to use it.   Plan: Return again in one weeks.  Diagnosis: Axis I: Major Depression, Recurrent severe    Axis II: No diagnosis    Kaydan Wong, LCSW 08/31/2011

## 2011-08-31 NOTE — Telephone Encounter (Signed)
Called patient to check in and assess her mood and safety. She just woke up and was able to get some sleep. She feels positive about her plans to go to [place] this weekend. She will contact Erskine Squibb, her old therapist, if an emergency arises and she feels unsafe. She denies any SI, intent or plan. Recommend patient contact this therapist on Monday to check in. Patient agrees.

## 2011-08-31 NOTE — Progress Notes (Signed)
   THERAPIST PROGRESS NOTE  Session Time: THERAPIST PROGRESS NOTE  Session Time: 3:00pm-3:50pm  Participation Level: Active  Behavioral Response: Well GroomedAlertDepressed and Irritable  Type of Therapy: Individual Therapy  Treatment Goals addressed: Coping  Interventions: CBT, DBT, Strength-based and Supportive  Summary: Lisa [name] is a 36 y.o. female who presents with depressed mood and irritable affect. She reports a return of her depression and anxiety,with a high degree of irritability. She reports mood lability and confusion over the lability. She is angry and highly irritable at work and at home. She is upset with her husband for his current depression and reaction to "small things" which she finds not worthy to be upset about. She is angry with her mothers constant attempts to convince patient that she should not have children because of her depression and her husbands temperament. She did confront her mother about how this feels, but has not confidence that this will change her mothers future behavior. She is in a "dark place" and endorses suicidal thoughts over the past week and admits to taking too much valium, but cannot tell this therapist how much. Her trigger for suicidal thought and intent is her ongoing depression and her hopelessness that it will "never end". She is able to contract for safety today and is able to discuss deterrents which are her family and her husband. She admits that she has not been honest with this therapist or her prior therapist about her suicidal ideation, intent or plan out of fear that she would be hospitalized and that we would be disappointed in her. Discussed hospitalization, which patient is strongly against, but accepts that if this therapist needed to hospitalize her, should would comply. At this time, she agrees to contact this therapist, her old therapist if this therapist is not available and go to the nearest emergency room. Plan is to touch base  with patient tomorrow to assess suicidality. Her sleep is poor and her appetite is wnl. Patient wants to use a plan to contact this office if she needs an additional session by using key words which would alert this therapist to the severity of her need. She will use the work "panic" if she is anxious and could use a session, but can go without if there is no time. She will use the word "touchstone" to communicate that she is in a dangerous and dark place.  Suicidal/Homicidal: Nowithout intent/plan  Therapist Response: Processed with patient her feeling of depression, frustration and irritability. Provided feedback and encouragement. Processed her cognitive distortions leading to hopelessness and helplessness. Explored alternative ways of releasing her stress other than hurting herself, such as exercising, hiking and walking her dog. Used CBT to assist patient with the identification of her negative distortions and irrational thoughts. Encouraged patient to verbalize alternative and factual responses which challenge her distortions. Used DBT to help patient build distress tolerance skills. Reviewed patients self care plan. Assessed her progress related to self care. Patient's self care is fair. Recommend proper diet, regular exercise, socialization and recreation.  Will contact patient tomorrow and check in to assess safety. Patient is able to clearly communicate her safety plan and commits to use it.  Plan: Return again in one weeks.  Diagnosis: Axis I: Major Depression, Recurrent severe  Axis II: No diagnosis   Adrionna Delcid, LCSW 08/31/2011

## 2011-08-31 NOTE — Telephone Encounter (Signed)
Called patient to assess her safety. She is currently taking care of a friend who had surgery and reports an absence of SI, intent or plan. She plans to travel to her sisters in [place] for the weekend to de-stress and be distracted. She did not sleep well last night and is upset with her husband. She will be away from her husband this weekend, which is positive. Reviewed safety plan. Will touch base at end of day today.

## 2011-09-05 ENCOUNTER — Ambulatory Visit (HOSPITAL_COMMUNITY): Payer: Self-pay | Admitting: Licensed Clinical Social Worker

## 2011-09-06 ENCOUNTER — Ambulatory Visit (HOSPITAL_COMMUNITY): Payer: Self-pay | Admitting: Licensed Clinical Social Worker

## 2011-09-07 ENCOUNTER — Ambulatory Visit (INDEPENDENT_AMBULATORY_CARE_PROVIDER_SITE_OTHER): Payer: 59 | Admitting: Licensed Clinical Social Worker

## 2011-09-07 ENCOUNTER — Encounter (HOSPITAL_COMMUNITY): Payer: Self-pay | Admitting: Licensed Clinical Social Worker

## 2011-09-07 DIAGNOSIS — F332 Major depressive disorder, recurrent severe without psychotic features: Secondary | ICD-10-CM

## 2011-09-07 NOTE — Progress Notes (Signed)
   THERAPIST PROGRESS NOTE  Session Time: 3:00pm-3:50pm  Participation Level: Active  Behavioral Response: Well GroomedAlertAnxious and Depressed  Type of Therapy: Individual Therapy  Treatment Goals addressed: Anxiety and Coping  Interventions: CBT, DBT, Strength-based, Supportive and Reframing  Summary: Lisa [name] is a 36 y.o. female who presents with depressed mood and flat affect. She reports a very difficult week with continued conflict at work, with her husband and her family. She describes last weekend as a disaster and is disappointed that her hope that she could relax was ruined because her parents wanted to paint the entire home. She is disgusted with her husband and his depression and the way in which he responds to her verbalized concerns or normal checking in. She is having difficulty accessing the positive parts of her and has had a recurrence of flashbacks this week due to being sore from painting. She has cut down on her Valium as instructed, since she has been abusing it and only took 2 the entire week. She has used a presentation at work to keep her focused and now that it is over she uncertain what she can use to distract herself over the weekend. Her sleep has been disrupted by flash backs and her appetite is wnl.   Suicidal/Homicidal: Nowithout intent/plan  Therapist Response: Processed w/pt her feelings of depression, anxiety and hopelessness. She has not had any episodes of suicidal thinking, intent or planning. She remains frustrated by her lack of progress and needs reframing and redirection to remind her of her progress. Praised her for decreasing her Valium doses and discussed her abuse of this medication. Educated patient on addiction and dependence of Valium, which she had not considered before. Used DBT to help patient build distress tolerance skills. Used CBT to assist patient with the identification of her negative distortions and irrational thoughts. Encouraged  patient to verbalize alternative and factual responses which challenge her distortions. Discussed plans for the weekend and encouraged her to spend time outdoors getting sunlight.  Reviewed coping strategies and crisis plan.   Plan: Return again in one  weeks.  Diagnosis: Axis I: Major Depression, Recurrent severe    Axis II: No diagnosis    Mairyn Lenahan, LCSW 09/07/2011

## 2011-09-12 ENCOUNTER — Ambulatory Visit (HOSPITAL_COMMUNITY): Payer: Self-pay | Admitting: Licensed Clinical Social Worker

## 2011-09-13 ENCOUNTER — Ambulatory Visit (INDEPENDENT_AMBULATORY_CARE_PROVIDER_SITE_OTHER): Payer: 59 | Admitting: Licensed Clinical Social Worker

## 2011-09-13 ENCOUNTER — Ambulatory Visit (HOSPITAL_COMMUNITY): Payer: Self-pay | Admitting: Licensed Clinical Social Worker

## 2011-09-13 ENCOUNTER — Encounter (HOSPITAL_COMMUNITY): Payer: Self-pay | Admitting: Licensed Clinical Social Worker

## 2011-09-13 DIAGNOSIS — F332 Major depressive disorder, recurrent severe without psychotic features: Secondary | ICD-10-CM

## 2011-09-13 NOTE — Progress Notes (Signed)
   THERAPIST PROGRESS NOTE  Session Time: 10:30am-11:20am  Participation Level: Active  Behavioral Response: Well GroomedAlertAnxious, Depressed and Irritable  Type of Therapy: Individual Therapy  Treatment Goals addressed: Anxiety, Coping and Diagnosis: depression  Interventions: CBT, DBT, Strength-based and Supportive  Summary: Lisa [name] is a 36 y.o. female who presents with depressed mood and anxious affect. She saw [name] yesterday for the first time and is pleased with her plan to start patient on Zoloft and increase to 200mg , to take Trazadone for sleep and continue Valium for the management of her anxiety. She demonstrates some hopefulness about this plan. Her stress at work continues at baseline but she is able to deflect some of the conflict this week in a more appropriate way. She and her husband had a "seriious" talk about trying to get pregnant. She describes his response to her request for support as limited and sometimes a refusal. She is upset that he is bothered by having to masturbate for assisted reproduction efforts if need be. She asked him to join her at church and he refuses. She endorses feeling alone in the marriage. Her sleep has improved and her appetite is wnl. She denies any flashbacks.     Suicidal/Homicidal: Nowithout intent/plan  Therapist Response: Processed w/pt her feelings of depression, anxiety and frustration. Explored her concerns about having a child with her husband and her readiness for the consequences if he is unable to tolerate parenting. She is disconnected to the seriousness of this issue and calmly responds that she will be fine if she has to divorce him. Provided feedback, challenging patients denial of how such an event would impact her life. Her thinking is rigid and distorted in this area.Used CBT to assist patient with the identification of her negative distortions and irrational thoughts. Encouraged patient to verbalize alternative and  factual responses which challenge her distortions. She has followed through on practicing being assertive with her mother, despite the discomfort it gives her. Used DBT to help her build distress tolerance.   Plan: Return again in one weeks.  Diagnosis: Axis I: Major Depression, Recurrent severe    Axis II: No diagnosis    Lisa Brake, LCSW 09/13/2011

## 2011-09-14 ENCOUNTER — Telehealth (HOSPITAL_COMMUNITY): Payer: Self-pay | Admitting: Licensed Clinical Social Worker

## 2011-09-14 NOTE — Telephone Encounter (Signed)
Called patient back since she left a message asking to be called because she had a severe flashback last night and feels panicked today. Assessed her safety and patient is safe. She denies any suicidal ideation, thought or intent. Used thought redirection and CBT with patient to calm her down on the phone. Encouraged patient to take a Valium as has been prescribed for her, which she is resistant to do. Recommend patient continue with distraction techniques to manage her panic. Alerted patient to call back if her situation elevates to a crisis or go to the nearest emergency room.

## 2011-09-19 ENCOUNTER — Ambulatory Visit (INDEPENDENT_AMBULATORY_CARE_PROVIDER_SITE_OTHER): Payer: 59 | Admitting: Licensed Clinical Social Worker

## 2011-09-19 ENCOUNTER — Ambulatory Visit (HOSPITAL_COMMUNITY): Payer: Self-pay | Admitting: Licensed Clinical Social Worker

## 2011-09-19 DIAGNOSIS — F4310 Post-traumatic stress disorder, unspecified: Secondary | ICD-10-CM

## 2011-09-19 DIAGNOSIS — F332 Major depressive disorder, recurrent severe without psychotic features: Secondary | ICD-10-CM

## 2011-09-20 ENCOUNTER — Encounter (HOSPITAL_COMMUNITY): Payer: Self-pay | Admitting: Licensed Clinical Social Worker

## 2011-09-20 DIAGNOSIS — F4310 Post-traumatic stress disorder, unspecified: Secondary | ICD-10-CM | POA: Insufficient documentation
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
## 2011-09-26 ENCOUNTER — Ambulatory Visit (HOSPITAL_COMMUNITY): Payer: Self-pay | Admitting: Licensed Clinical Social Worker

## 2011-09-26 ENCOUNTER — Ambulatory Visit (INDEPENDENT_AMBULATORY_CARE_PROVIDER_SITE_OTHER): Payer: 59 | Admitting: Licensed Clinical Social Worker

## 2011-09-26 DIAGNOSIS — F4310 Post-traumatic stress disorder, unspecified: Secondary | ICD-10-CM

## 2011-09-26 DIAGNOSIS — F332 Major depressive disorder, recurrent severe without psychotic features: Secondary | ICD-10-CM

## 2011-09-27 NOTE — Progress Notes (Signed)
   THERAPIST PROGRESS NOTE  Session Time: 3:00pm-3:50pm  Participation Level: Active  Behavioral Response: Well GroomedAlertDepressed  Type of Therapy: Individual Therapy  Treatment Goals addressed: Coping  Interventions: CBT, DBT, Strength-based, Supportive and Reframing  Summary: Lisa [name] is a 36 y.o. female who presents with depressed mood and flat affect. She feels the Zoloft working and her anxiety has decreased significantly, but it is replaced with "flatness". She has experienced a few panic attacks at work, but has been able to manage these. She is concerned about getting pregnant, taking hormones and dealing with her husband during the process. She is afraid that any progress she is feeling will be cancelled by taking hormones. She is afraid that her husband will be unable to help her emotionally through the process. She is concerned that he wants to use a sperm donor now, but will regret it later. She is working on telling her mother less about her personal life, feelings and choices.  Her sleep is improving and her appetite is wnl.   Suicidal/Homicidal: Nowithout intent/plan  Therapist Response: Processed with patient her anxiety about getting pregnant. She has normalized her husbands emotional unavailability so much that she has accepted the fact that she may have to handle this alone. Provided feedback about the low expectations patient has of her husband and the negative consequences she experiences as a result of this. 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 Used DBT to help patient build distress tolerance. Explored patients self hatred and her inability to accept herself. Used CBT to assist patient with the identification of her negative distortions and irrational thoughts. Encouraged patient to verbalize alternative and factual responses which challenge her distortions. Reviewed patients self care plan. Assessed her progress related to  self care. Patient's self care is good. Recommend proper diet, regular exercise, socialization and recreation.   Plan: Return again in one weeks.  Diagnosis: Axis I: Major Depression, Recurrent severe and Post Traumatic Stress Disorder    Axis II: No diagnosis    Anthany Thornhill, LCSW 09/27/2011

## 2011-10-03 ENCOUNTER — Ambulatory Visit (HOSPITAL_COMMUNITY): Payer: Self-pay | Admitting: Licensed Clinical Social Worker

## 2011-10-03 ENCOUNTER — Ambulatory Visit (INDEPENDENT_AMBULATORY_CARE_PROVIDER_SITE_OTHER): Payer: 59 | Admitting: Licensed Clinical Social Worker

## 2011-10-03 DIAGNOSIS — F332 Major depressive disorder, recurrent severe without psychotic features: Secondary | ICD-10-CM

## 2011-10-03 DIAGNOSIS — F4310 Post-traumatic stress disorder, unspecified: Secondary | ICD-10-CM

## 2011-10-03 NOTE — Progress Notes (Signed)
   THERAPIST PROGRESS NOTE  Session Time: 3:00pm-3:50pm  Participation Level: Active  Behavioral Response: Well GroomedAlertAnxious  Type of Therapy: Individual Therapy  Treatment Goals addressed: Anxiety and Coping  Interventions: CBT, DBT, Supportive and Reframing  Summary: Lisa [name] is a 36 y.o. female who presents with anxious mood and affect. She reports continued improvement in her depression and was experiencing improvement in her anxiety, put experienced a severe flashback last night and found herself in the bathroom, on the floor, up against the wall, with her toe nails pulled off. She has been anxious all day today following this. She is upset that these continue and discouraged by this. She is upset because she wants to be able to take her dog for a walk on a nice day like today, but doesn't feel safe outside, so she is restricted to her back yard. She is happy that her husband has begun therapy and is hopeful. She was able to have important discussions with him about getting pregnant and is surprised that he has now changed his mind and does not want to use a sperm donor.   Suicidal/Homicidal: Nowithout intent/plan  Therapist Response: Processed with patient her flashback and her frustration with her trauma. Assessed her current functioning. Her depression is improving, with increased animation in her affect and improved hopefulness. Explored her continued pattern of protecting her husband from difficult emotional situations. Challenged patient to set higher expectations for him. Discussed next steps regarding getting pregnant and setting her own time table. Used CBT to assist patient with the identification of her negative distortions and irrational thoughts. Encouraged patient to verbalize alternative and factual responses which challenge her distortions. Reviewed patients self care plan. Assessed her progress related to self care. Patient's self care is good. Recommend proper  diet, regular exercise, socialization and recreation. Used DBT to help patient build distress tolerance.   Plan: Return again in one weeks.  Diagnosis: Axis I: Major Depression, Recurrent severe and Post Traumatic Stress Disorder    Axis II: No diagnosis    Doyle Kunath, LCSW 10/03/2011

## 2011-10-04 ENCOUNTER — Ambulatory Visit (INDEPENDENT_AMBULATORY_CARE_PROVIDER_SITE_OTHER): Payer: 59 | Admitting: Licensed Clinical Social Worker

## 2011-10-04 ENCOUNTER — Telehealth (HOSPITAL_COMMUNITY): Payer: Self-pay | Admitting: Licensed Clinical Social Worker

## 2011-10-04 DIAGNOSIS — F332 Major depressive disorder, recurrent severe without psychotic features: Secondary | ICD-10-CM

## 2011-10-04 DIAGNOSIS — F4310 Post-traumatic stress disorder, unspecified: Secondary | ICD-10-CM

## 2011-10-04 NOTE — Telephone Encounter (Signed)
Called patient back. She had a severe flashback while at work and feels overwhelming panic and feels unsafe. Advise patient to come to this therapists office to be seen at 4:45PM. She agrees and is able to contract for safety. Advised her to have someone come with her.

## 2011-10-05 ENCOUNTER — Ambulatory Visit (HOSPITAL_COMMUNITY): Payer: Self-pay | Admitting: Licensed Clinical Social Worker

## 2011-10-05 ENCOUNTER — Telehealth (HOSPITAL_COMMUNITY): Payer: Self-pay | Admitting: Licensed Clinical Social Worker

## 2011-10-05 NOTE — Telephone Encounter (Signed)
Opened in error

## 2011-10-05 NOTE — Telephone Encounter (Signed)
Called patient back. She reports continued high anxiety,with panic following her dissociative flashback yesterday. Reviewed coping strategies with patient and her safety plan. Patient commits to safety.
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
## 2011-10-10 ENCOUNTER — Ambulatory Visit (INDEPENDENT_AMBULATORY_CARE_PROVIDER_SITE_OTHER): Payer: 59 | Admitting: Licensed Clinical Social Worker

## 2011-10-10 DIAGNOSIS — F332 Major depressive disorder, recurrent severe without psychotic features: Secondary | ICD-10-CM

## 2011-10-10 DIAGNOSIS — F4310 Post-traumatic stress disorder, unspecified: Secondary | ICD-10-CM
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
## 2011-10-11 ENCOUNTER — Ambulatory Visit (INDEPENDENT_AMBULATORY_CARE_PROVIDER_SITE_OTHER): Payer: 59 | Admitting: Obstetrics and Gynecology

## 2011-10-11 ENCOUNTER — Encounter: Payer: Self-pay | Admitting: Obstetrics and Gynecology

## 2011-10-11 VITALS — BP 112/68 | Ht 67.25 in | Wt 171.0 lb

## 2011-10-11 DIAGNOSIS — F410 Panic disorder [episodic paroxysmal anxiety] without agoraphobia: Secondary | ICD-10-CM

## 2011-10-11 DIAGNOSIS — N96 Recurrent pregnancy loss: Secondary | ICD-10-CM

## 2011-10-11 DIAGNOSIS — Z8742 Personal history of other diseases of the female genital tract: Secondary | ICD-10-CM

## 2011-10-11 DIAGNOSIS — Z9884 Bariatric surgery status: Secondary | ICD-10-CM | POA: Insufficient documentation

## 2011-10-11 DIAGNOSIS — N926 Irregular menstruation, unspecified: Secondary | ICD-10-CM

## 2011-10-11 DIAGNOSIS — G40909 Epilepsy, unspecified, not intractable, without status epilepticus: Secondary | ICD-10-CM

## 2011-10-11 DIAGNOSIS — E1169 Type 2 diabetes mellitus with other specified complication: Secondary | ICD-10-CM | POA: Insufficient documentation

## 2011-10-11 DIAGNOSIS — E039 Hypothyroidism, unspecified: Secondary | ICD-10-CM

## 2011-10-11 DIAGNOSIS — R350 Frequency of micturition: Secondary | ICD-10-CM

## 2011-10-11 DIAGNOSIS — O039 Complete or unspecified spontaneous abortion without complication: Secondary | ICD-10-CM

## 2011-10-11 DIAGNOSIS — IMO0001 Reserved for inherently not codable concepts without codable children: Secondary | ICD-10-CM

## 2011-10-11 DIAGNOSIS — Z124 Encounter for screening for malignant neoplasm of cervix: Secondary | ICD-10-CM

## 2011-10-11 DIAGNOSIS — N979 Female infertility, unspecified: Secondary | ICD-10-CM | POA: Insufficient documentation

## 2011-10-11 DIAGNOSIS — E119 Type 2 diabetes mellitus without complications: Secondary | ICD-10-CM

## 2011-10-11 HISTORY — DX: Recurrent pregnancy loss: N96

## 2011-10-11 LAB — POCT URINALYSIS DIPSTICK
Blood, UA: NEGATIVE
Glucose, UA: NEGATIVE
Nitrite, UA: NEGATIVE
pH, UA: 5

## 2011-10-11 LAB — POCT URINE PREGNANCY: Preg Test, Ur: NEGATIVE

## 2011-10-11 MED ORDER — PROGESTERONE 50 MG VA SUPP: 50.0000 mg | Freq: Two times a day (BID) | VAGINAL | Status: AC

## 2011-10-11 MED ORDER — CLOMIPHENE CITRATE 50 MG PO TABS: 50.0000 mg | ORAL_TABLET | Freq: Every day | ORAL | Status: AC

## 2011-10-11 NOTE — Progress Notes (Signed)
The patient reports:normal menses, no abnormal bleeding, pelvic pain or discharge, no complaints  Contraception:no method  Last mammogram: patient has never had a mammogram Last pap: was normal  April 2012  GC/Chlamydia cultures offered: declined HIV/RPR/HbsAg offered:  declined HSV 1 and 2 glycoprotein offered: declined  Menstrual cycle regular and monthly: yes Menstrual flow normal: Yes  Urinary symptoms: urinary frequency Normal bowel movements: Yes Reports abuse at home: No

## 2011-10-11 NOTE — Patient Instructions (Addendum)
Patient Education Materials to be provided at check out (*indicates is located in [employer] [employer]):  Infertility treatment  Clomid d3-7 Intercourse every other day d11-17 Progesterone drawn d21

## 2011-10-11 NOTE — Progress Notes (Signed)
Subjective:    Lisa [name] is a 36 y.o. female, No obstetric history on file., who presents for an annual exam.     History   Social History  . Marital Status: Married    Spouse Name: N/A    Number of Children: N/A  . Years of Education: N/A   Social History Main Topics  . Smoking status: Never Smoker   . Smokeless tobacco: Never Used  . Alcohol Use: No  . Drug Use: No  . Sexually Active: Yes    Birth Control/ Protection: None     nuva ring   Other Topics Concern  . None   Social History Narrative  . None    Menstrual cycle:   LMP: Patient's last menstrual period was 09/14/2011.           Cycle: flow is excessive with use of 24 pads or tampons on heaviest days, regular every 25 days without intermenstrual spotting, usually lasting 6 to 7 days and with severe dysmenorrhea  The following portions of the patient's history were reviewed and updated as appropriate: allergies, current medications, past family history, past medical history, past social history, past surgical history and problem list.  Review of Systems Pertinent items are noted in HPI. Breast:Negative for breast lump,nipple discharge or nipple retraction Gastrointestinal: Negative for abdominal pain, change in bowel habits or rectal bleeding Urinary:negative   Objective:    BP 112/68  Ht 5' 7.25" (1.708 m)  Wt 171 lb (77.565 kg)  BMI 26.58 kg/m2  LMP 09/14/2011    Weight:  Wt Readings from Last 1 Encounters:  10/11/11 171 lb (77.565 kg)          BMI: Body mass index is 26.58 kg/(m^2).  General Appearance: Alert, appropriate appearance for age. No acute distress HEENT: Grossly normal Neck / Thyroid: Supple, no masses, nodes or enlargement Lungs: clear to auscultation bilaterally Back: No CVA tenderness Breast Exam: right breast with 15mm hyperpimented nevus, stable in size and No masses or nodes.No dimpling, nipple retraction or discharge. Cardiovascular: Regular rate and rhythm. S1, S2, no  murmur Gastrointestinal: Soft, non-tender, no masses or organomegaly Pelvic Exam: Vulva and vagina appear normal. Bimanual exam reveals normal uterus and adnexa. Rectovaginal: normal rectal, no masses Lymphatic Exam: Non-palpable nodes in neck, clavicular, axillary, or inguinal regions Skin: no rash or abnormalities Neurologic: Normal gait and speech, no tremor  Psychiatric: Alert and oriented, appropriate affect.   Wet Prep:not applicable Urinalysis;  Neg.  S/p UTI in Feb. UPT: Negative   Assessment:    Normal gyn exam Hx recurrent miscarriages.    Infertility PCOS Recent wt loss since 7/12 bariatric surgery Epilepsy Depression  Hypothyroidism Panic attacks Type II DM with nl sugars s/p bariatric surgery Recurrent UTI's Asthma    Plan:    pap smear STD screening: not done Planning conception Reviewed all current meds.  Recommended discontinuing Valium.  Pt understands all other meds are Cat C Continue current supplements.  May need up to folate 4mg  daily during pregnancy Clomid 50 mg.  No Progesterone suppositories this cycle until ovulatory dose of Clomid is established. Then will add Progesterone day 14 until menses or 14 wks pregnancy. FU d28-32      Dominga Mcduffie PMD

## 2011-10-17 ENCOUNTER — Ambulatory Visit (INDEPENDENT_AMBULATORY_CARE_PROVIDER_SITE_OTHER): Payer: 59 | Admitting: Licensed Clinical Social Worker

## 2011-10-17 DIAGNOSIS — F4310 Post-traumatic stress disorder, unspecified: Secondary | ICD-10-CM

## 2011-10-17 DIAGNOSIS — F332 Major depressive disorder, recurrent severe without psychotic features: Secondary | ICD-10-CM

## 2011-10-17 LAB — PAP IG AND HPV HIGH-RISK
HPV, high-risk: NEGATIVE
PAP Smear Comment: 0
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
## 2011-10-18 ENCOUNTER — Telehealth (HOSPITAL_COMMUNITY): Payer: Self-pay | Admitting: Licensed Clinical Social Worker

## 2011-10-18 NOTE — Telephone Encounter (Signed)
Called patient to check in. She is anxious, but reports feeling safe. She was drowsy and trying to fall asleep. Reviewed her crisis plan which involves her calling Dr. Levada Dy after hours if she feels unsafe. Will follow up at 8:30am tomorrow with a therapy appointment.

## 2011-10-18 NOTE — Telephone Encounter (Signed)
Returned patients phone call and left message checking in. Will call again later to connect with patient.

## 2011-10-19 ENCOUNTER — Ambulatory Visit (INDEPENDENT_AMBULATORY_CARE_PROVIDER_SITE_OTHER): Payer: 59 | Admitting: Licensed Clinical Social Worker

## 2011-10-19 DIAGNOSIS — F332 Major depressive disorder, recurrent severe without psychotic features: Secondary | ICD-10-CM

## 2011-10-19 DIAGNOSIS — F4481 Dissociative identity disorder: Secondary | ICD-10-CM | POA: Insufficient documentation

## 2011-10-19 DIAGNOSIS — F4310 Post-traumatic stress disorder, unspecified: Secondary | ICD-10-CM
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
## 2011-10-24 ENCOUNTER — Ambulatory Visit (INDEPENDENT_AMBULATORY_CARE_PROVIDER_SITE_OTHER): Payer: 59 | Admitting: Licensed Clinical Social Worker

## 2011-10-24 ENCOUNTER — Other Ambulatory Visit: Payer: Self-pay | Admitting: Obstetrics and Gynecology

## 2011-10-24 DIAGNOSIS — F332 Major depressive disorder, recurrent severe without psychotic features: Secondary | ICD-10-CM

## 2011-10-24 DIAGNOSIS — F4310 Post-traumatic stress disorder, unspecified: Secondary | ICD-10-CM

## 2011-10-24 DIAGNOSIS — F4481 Dissociative identity disorder: Secondary | ICD-10-CM
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
## 2011-10-24 NOTE — Telephone Encounter (Signed)
ROUTED TO [facility]

## 2011-10-25 ENCOUNTER — Telehealth: Payer: Self-pay | Admitting: Obstetrics and Gynecology

## 2011-10-25 DIAGNOSIS — N926 Irregular menstruation, unspecified: Secondary | ICD-10-CM

## 2011-10-25 NOTE — Telephone Encounter (Signed)
Tc to pt rgdg telephone call from pharm. Pt states,"ok to call progesterone supp ino [facility] for this rx. Rx called to pharm. Pt voices understanding.

## 2011-10-31 ENCOUNTER — Ambulatory Visit (HOSPITAL_COMMUNITY): Payer: Self-pay | Admitting: Licensed Clinical Social Worker

## 2011-11-02 ENCOUNTER — Ambulatory Visit (INDEPENDENT_AMBULATORY_CARE_PROVIDER_SITE_OTHER): Payer: 59 | Admitting: Licensed Clinical Social Worker

## 2011-11-02 DIAGNOSIS — F4481 Dissociative identity disorder: Secondary | ICD-10-CM

## 2011-11-02 DIAGNOSIS — F332 Major depressive disorder, recurrent severe without psychotic features: Secondary | ICD-10-CM

## 2011-11-02 DIAGNOSIS — F4310 Post-traumatic stress disorder, unspecified: Secondary | ICD-10-CM
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
## 2011-11-05 ENCOUNTER — Telehealth: Payer: Self-pay | Admitting: Obstetrics and Gynecology

## 2011-11-05 ENCOUNTER — Other Ambulatory Visit: Payer: 59

## 2011-11-05 DIAGNOSIS — N979 Female infertility, unspecified: Secondary | ICD-10-CM

## 2011-11-05 NOTE — Telephone Encounter (Signed)
Chandra/epic/diff. msg was cld in this morn.

## 2011-11-05 NOTE — Telephone Encounter (Signed)
Tc to pt per telephone call. Pt started menses on 10-18-11 and took Clomid as directed. Pt now has started bldg again on 11-03-11( heavy flow and cramping). Pt wants to know when day#21 progesterone is needed. Informed pt to take a upt today and cb with results. I will then consult with vph per recs after upt results. Pt voices understanding.

## 2011-11-05 NOTE — Telephone Encounter (Signed)
Lm on vm to cb per vph recs.  

## 2011-11-05 NOTE — Telephone Encounter (Signed)
Tc from pt. Informed pt needs to have stat [facility] today. If neg, pt to begin Clomid 100mg  today. Day #21 progesterone to be done on 11-23-11(pt has written order). Fu appt sched 12-03-11@10 :30 with vph. Pt voices understanding.

## 2011-11-05 NOTE — Telephone Encounter (Signed)
Tc from pt. Pregnancy test=negative. Will consult with vph per recs. Pt agrees.

## 2011-11-06 MED ORDER — CLOMIPHENE CITRATE 50 MG PO TABS: ORAL_TABLET | ORAL | Status: DC

## 2011-11-06 NOTE — Telephone Encounter (Signed)
Tc to pt per [facility] results=negative. Pt already started Clomid 100mg  yesterday due to having a recent rx left for Clomid 50mg  .Rx for Clomid 100mg  e-pres to pharm.  Pt agrees.

## 2011-11-07 ENCOUNTER — Ambulatory Visit (INDEPENDENT_AMBULATORY_CARE_PROVIDER_SITE_OTHER): Payer: 59 | Admitting: Licensed Clinical Social Worker

## 2011-11-07 ENCOUNTER — Encounter (HOSPITAL_COMMUNITY): Payer: Self-pay | Admitting: Licensed Clinical Social Worker

## 2011-11-07 DIAGNOSIS — F332 Major depressive disorder, recurrent severe without psychotic features: Secondary | ICD-10-CM

## 2011-11-07 DIAGNOSIS — F4481 Dissociative identity disorder: Secondary | ICD-10-CM

## 2011-11-07 DIAGNOSIS — F4310 Post-traumatic stress disorder, unspecified: Secondary | ICD-10-CM

## 2011-11-07 NOTE — Progress Notes (Signed)
   THERAPIST PROGRESS NOTE  Session Time: 3:00pm-3:50pm  Participation Level: Active  Behavioral Response: Well GroomedAlertEuthymic  Type of Therapy: Individual Therapy  Treatment Goals addressed: Coping  Interventions: CBT, Motivational Interviewing, Solution Focused, Strength-based and Reframing  Summary: Lisa [name] is a 36 y.o. female who presents with euthymic mood and bright affect. She reports improvement in her degree of depression and stress at work. She continues to experience anxiety and panic attacks, but has not experienced any dissociative flashbacks over the past week,with an absence of SIB from herself or alter. She remains frustrated with her alter and wants to be integrated, but reports that her alter will not communicate with her. She has gotten her period and is not pregnant. Her doctor suspects that she did not ovulate and now patient is on a double dose of Clomid. She endorses increased irritability, distract ability and poor focus. She is trying to manage her relationship with her husband and is disengaging to protect herself from his depression, anger and irritably. She is uncomfortable with her weight loss, but is committed to loosing more weight to increase her chances of pregnancy. Her sleep is disrupted and she believes that her alter is up at night reading religious material. Assessed her safety and patient denies any suicidal ideation, intent or plan.   Suicidal/Homicidal: Nowithout intent/plan  Therapist Response: Reviewed patients progress and assessed her current functioning. Processed w/pt her frustration regarding her alter. Patient remains resistant to inpatient treatment to address her DID. Explored her progress related to setting boundaries with her family and she continues to struggle with an enmeshed relationship with her mother. Explored the negative consequences associated with her assuming responsibility to manage her mothers anxiety related to patient  trying to get pregnant. Her thinking is distorted and negative. Used CBT to assist patient with the identification of her negative distortions and irrational thoughts. Encouraged patient to verbalize alternative and factual responses which challenge her distortions. Used motivational interviewing to assist and encourage patient through the change process. Explored patients barriers to change. Reviewed patients self care plan. Assessed her progress related to self care. Patient's self care is good. Recommend proper diet, regular exercise, socialization and recreation.   Plan: Return again in one weeks.  Diagnosis: Axis I: Major Depression, Recurrent severe, Post Traumatic Stress Disorder and Disocciative Identiy Disorder    Axis II: No diagnosis    Diquan Kassis, LCSW 11/07/2011

## 2011-11-09 ENCOUNTER — Telehealth (HOSPITAL_COMMUNITY): Payer: Self-pay | Admitting: Licensed Clinical Social Worker

## 2011-11-09 NOTE — Telephone Encounter (Signed)
Erroneous encounter

## 2011-11-14 ENCOUNTER — Encounter: Payer: 59 | Admitting: Obstetrics and Gynecology

## 2011-11-14 ENCOUNTER — Ambulatory Visit (INDEPENDENT_AMBULATORY_CARE_PROVIDER_SITE_OTHER): Payer: 59 | Admitting: Licensed Clinical Social Worker

## 2011-11-14 DIAGNOSIS — F332 Major depressive disorder, recurrent severe without psychotic features: Secondary | ICD-10-CM

## 2011-11-14 DIAGNOSIS — F4310 Post-traumatic stress disorder, unspecified: Secondary | ICD-10-CM

## 2011-11-14 DIAGNOSIS — F4481 Dissociative identity disorder: Secondary | ICD-10-CM
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
## 2011-11-21 ENCOUNTER — Ambulatory Visit (INDEPENDENT_AMBULATORY_CARE_PROVIDER_SITE_OTHER): Payer: 59 | Admitting: Licensed Clinical Social Worker

## 2011-11-21 DIAGNOSIS — F332 Major depressive disorder, recurrent severe without psychotic features: Secondary | ICD-10-CM

## 2011-11-21 DIAGNOSIS — F4310 Post-traumatic stress disorder, unspecified: Secondary | ICD-10-CM
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
## 2011-11-23 ENCOUNTER — Ambulatory Visit (INDEPENDENT_AMBULATORY_CARE_PROVIDER_SITE_OTHER): Payer: 59 | Admitting: Licensed Clinical Social Worker

## 2011-11-23 DIAGNOSIS — F332 Major depressive disorder, recurrent severe without psychotic features: Secondary | ICD-10-CM

## 2011-11-23 DIAGNOSIS — F4310 Post-traumatic stress disorder, unspecified: Secondary | ICD-10-CM
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
## 2011-11-26 ENCOUNTER — Other Ambulatory Visit: Payer: Self-pay | Admitting: Obstetrics and Gynecology

## 2011-11-26 DIAGNOSIS — N979 Female infertility, unspecified: Secondary | ICD-10-CM

## 2011-11-27 ENCOUNTER — Other Ambulatory Visit (INDEPENDENT_AMBULATORY_CARE_PROVIDER_SITE_OTHER): Payer: 59 | Admitting: Obstetrics and Gynecology

## 2011-11-27 DIAGNOSIS — N979 Female infertility, unspecified: Secondary | ICD-10-CM

## 2011-11-28 ENCOUNTER — Ambulatory Visit (INDEPENDENT_AMBULATORY_CARE_PROVIDER_SITE_OTHER): Payer: 59 | Admitting: Licensed Clinical Social Worker

## 2011-11-28 DIAGNOSIS — F4310 Post-traumatic stress disorder, unspecified: Secondary | ICD-10-CM

## 2011-11-28 DIAGNOSIS — F332 Major depressive disorder, recurrent severe without psychotic features: Secondary | ICD-10-CM
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
## 2011-11-30 ENCOUNTER — Ambulatory Visit (INDEPENDENT_AMBULATORY_CARE_PROVIDER_SITE_OTHER): Payer: 59 | Admitting: Licensed Clinical Social Worker

## 2011-11-30 DIAGNOSIS — F4310 Post-traumatic stress disorder, unspecified: Secondary | ICD-10-CM

## 2011-11-30 DIAGNOSIS — F332 Major depressive disorder, recurrent severe without psychotic features: Secondary | ICD-10-CM
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
## 2011-12-03 ENCOUNTER — Ambulatory Visit (INDEPENDENT_AMBULATORY_CARE_PROVIDER_SITE_OTHER): Payer: 59 | Admitting: Licensed Clinical Social Worker

## 2011-12-03 ENCOUNTER — Ambulatory Visit (INDEPENDENT_AMBULATORY_CARE_PROVIDER_SITE_OTHER): Payer: 59 | Admitting: Obstetrics and Gynecology

## 2011-12-03 ENCOUNTER — Encounter: Payer: Self-pay | Admitting: Obstetrics and Gynecology

## 2011-12-03 VITALS — BP 102/62 | Temp 98.7°F | Ht 67.5 in | Wt 172.0 lb

## 2011-12-03 DIAGNOSIS — Z309 Encounter for contraceptive management, unspecified: Secondary | ICD-10-CM

## 2011-12-03 DIAGNOSIS — N926 Irregular menstruation, unspecified: Secondary | ICD-10-CM

## 2011-12-03 DIAGNOSIS — F332 Major depressive disorder, recurrent severe without psychotic features: Secondary | ICD-10-CM

## 2011-12-03 DIAGNOSIS — F419 Anxiety disorder, unspecified: Secondary | ICD-10-CM | POA: Insufficient documentation

## 2011-12-03 DIAGNOSIS — F4310 Post-traumatic stress disorder, unspecified: Secondary | ICD-10-CM

## 2011-12-03 DIAGNOSIS — O2620 Pregnancy care for patient with recurrent pregnancy loss, unspecified trimester: Secondary | ICD-10-CM

## 2011-12-03 DIAGNOSIS — F411 Generalized anxiety disorder: Secondary | ICD-10-CM

## 2011-12-03 DIAGNOSIS — R569 Unspecified convulsions: Secondary | ICD-10-CM

## 2011-12-03 DIAGNOSIS — IMO0001 Reserved for inherently not codable concepts without codable children: Secondary | ICD-10-CM

## 2011-12-03 DIAGNOSIS — N970 Female infertility associated with anovulation: Secondary | ICD-10-CM | POA: Insufficient documentation

## 2011-12-03 MED ORDER — ETONOGESTREL-ETHINYL ESTRADIOL 0.12-0.015 MG/24HR VA RING: VAGINAL_RING | VAGINAL | Status: DC
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
## 2011-12-03 NOTE — Progress Notes (Signed)
Last Pap: 10/11/2011 Trying to Conceive:the patient has been seen as an infertility patient being treated for chronic oligo ovulation. She had a progesterone of 14.5 in response to Clomid 100 mg last month. She presents today stating that her husband decided yesterday that this is just too hard and he wants a divorce. She is thus no longer interested in pursuing fertility treatment and wants to have extended cycle birth control allowing her to have a with 4 menses she annually. She has used hearing in the past Exam: BP 102/62  Temp(Src) 98.7 F (37.1 C) (Oral)  Ht 5' 7.5" (1.715 m)  Wt 172 lb (78.019 kg)  BMI 26.54 kg/m2  LMP 11/30/2011  Pelvic exam: normal external genitalia, vulva, vagina, cervix, uterus and adnexa U PT negative.  Impression: Chronic oligo ovulation Marital discord Desire for contraceptive  Recommendation: Nuvaring is placed today and will be changed every 21 days for total nor rings then noted and the ring for 3 days restarting the cycle The patient will call prior to her annual if she wishes to restart infertility therapy otherwise she will be seen for her annual examination. 3 samples of NuvaRing or given to the patient today

## 2011-12-04 ENCOUNTER — Ambulatory Visit (HOSPITAL_COMMUNITY): Payer: Self-pay | Admitting: Licensed Clinical Social Worker

## 2011-12-05 ENCOUNTER — Ambulatory Visit (HOSPITAL_COMMUNITY): Payer: 59 | Admitting: Licensed Clinical Social Worker

## 2011-12-05 DIAGNOSIS — F332 Major depressive disorder, recurrent severe without psychotic features: Secondary | ICD-10-CM

## 2011-12-05 DIAGNOSIS — F4310 Post-traumatic stress disorder, unspecified: Secondary | ICD-10-CM
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
## 2011-12-06 ENCOUNTER — Telehealth (HOSPITAL_COMMUNITY): Payer: Self-pay | Admitting: Licensed Clinical Social Worker

## 2011-12-12 ENCOUNTER — Ambulatory Visit (HOSPITAL_COMMUNITY): Payer: Self-pay | Admitting: Licensed Clinical Social Worker

## 2011-12-17 NOTE — Telephone Encounter (Signed)
Spoke with patient about her fear about her marriage ending. Will follow up at next session.

## 2011-12-18 ENCOUNTER — Ambulatory Visit (INDEPENDENT_AMBULATORY_CARE_PROVIDER_SITE_OTHER): Payer: 59 | Admitting: Licensed Clinical Social Worker

## 2011-12-18 DIAGNOSIS — F332 Major depressive disorder, recurrent severe without psychotic features: Secondary | ICD-10-CM

## 2011-12-18 DIAGNOSIS — F4310 Post-traumatic stress disorder, unspecified: Secondary | ICD-10-CM
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
## 2011-12-19 ENCOUNTER — Ambulatory Visit (INDEPENDENT_AMBULATORY_CARE_PROVIDER_SITE_OTHER): Payer: 59 | Admitting: Licensed Clinical Social Worker

## 2011-12-19 DIAGNOSIS — F4481 Dissociative identity disorder: Secondary | ICD-10-CM

## 2011-12-19 DIAGNOSIS — F4310 Post-traumatic stress disorder, unspecified: Secondary | ICD-10-CM

## 2011-12-19 DIAGNOSIS — F332 Major depressive disorder, recurrent severe without psychotic features: Secondary | ICD-10-CM
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
## 2011-12-24 ENCOUNTER — Ambulatory Visit (INDEPENDENT_AMBULATORY_CARE_PROVIDER_SITE_OTHER): Payer: 59 | Admitting: Licensed Clinical Social Worker

## 2011-12-24 DIAGNOSIS — F332 Major depressive disorder, recurrent severe without psychotic features: Secondary | ICD-10-CM

## 2011-12-24 DIAGNOSIS — F4310 Post-traumatic stress disorder, unspecified: Secondary | ICD-10-CM

## 2011-12-24 NOTE — Progress Notes (Signed)
   THERAPIST PROGRESS NOTE  Session Time: 4:00pm-4:50pm  Participation Level: Active  Behavioral Response: Well GroomedAlertDepressed  Type of Therapy: Individual Therapy  Treatment Goals addressed: Coping  Interventions: CBT and Reframing  Summary: Lisa [name] is a 36 y.o. female who presents with depressed mood and flat affect. Her husband left town last week and she processes this experience and how inappropriate her soon to be ex-husband behaved. She endorses confusing emotions which are unpredictable. She is fearful of becoming rageful as she wants to keep this peaceful in an effort to avoid any problems with the divorce proceedings. She is not sleeping, has intrusive thoughts and strong negative self talk. She is unable to reframe her thinking and is frustrated by this. She evaluates how she contributed to the demise of her marriage and reflects upon her secrets, protecting her husband from feeling pain and her willingness to tolerate "anything" in order to get what she wants. Her appetite is wnl.   Suicidal/Homicidal: Nowithout intent/plan  Therapist Response: Processed w/pt her feelings, normalized her confusion and grief reaction. Explored her negative self talk and encouraged patient to verbally reframe her thinking. Used CBT to assist patient with the identification of her negative distortions and irrational thoughts. Encouraged patient to verbalize alternative and factual responses which challenge her distortions. She lacks motivation, but is able to go to work. Processed her responses to her husband's text messages since he has left. Challenged patients tendency to rescue him and discussed ways to disengage. Reviewed patients self care plan. Assessed her progress related to self care. Patient's self care is fair. Recommend proper diet, regular exercise, socialization and recreation.   Plan: Return again two days.  Diagnosis: Axis I: Major Depression, Recurrent severe and Post  Traumatic Stress Disorder    Axis II: No diagnosis    Herchel Hopkin, LCSW 12/24/2011

## 2011-12-26 ENCOUNTER — Ambulatory Visit (INDEPENDENT_AMBULATORY_CARE_PROVIDER_SITE_OTHER): Payer: 59 | Admitting: Licensed Clinical Social Worker

## 2011-12-26 DIAGNOSIS — F332 Major depressive disorder, recurrent severe without psychotic features: Secondary | ICD-10-CM

## 2011-12-26 DIAGNOSIS — F4310 Post-traumatic stress disorder, unspecified: Secondary | ICD-10-CM
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
## 2012-01-02 ENCOUNTER — Ambulatory Visit (INDEPENDENT_AMBULATORY_CARE_PROVIDER_SITE_OTHER): Payer: 59 | Admitting: Licensed Clinical Social Worker

## 2012-01-02 DIAGNOSIS — F4310 Post-traumatic stress disorder, unspecified: Secondary | ICD-10-CM

## 2012-01-02 DIAGNOSIS — F332 Major depressive disorder, recurrent severe without psychotic features: Secondary | ICD-10-CM

## 2012-01-02 NOTE — Progress Notes (Signed)
   THERAPIST PROGRESS NOTE  Session Time: 2:00pm-2:50pm  Participation Level: Active  Behavioral Response: Well GroomedAlertDepressed  Type of Therapy: Individual Therapy  Treatment Goals addressed: Coping  Interventions: CBT and Reframing  Summary: Lisa [name] is a 36 y.o. female who presents with depressed mood and flat affect. She remains very depressed with low self esteem and strong negative automatic self talk regarding being a failure, being weak and never being good enough. She feels that she is flawed and that there is no hope that she will ever be able to feel any differently. She is angry with her husband for the way in which he behaved once deciding to leave the marriage and she processes her belief that he was planning this before she brought it up. Her sleep remains poor and her appetite is wnl. She has not hurt herself by cutting.    Suicidal/Homicidal: Nowithout intent/plan  Therapist Response: Processed w/pt her feelings of depression. Explored her negative self talk. Used CBT to assist patient with the identification of her negative distortions and irrational thoughts. Encouraged patient to verbalize alternative and factual responses which challenge her distortions. Discussed patients lack of hope and challenged her to identify something or someone to put hope in. Explored her low self esteem and encouraged patient to involve herself in esteem building activities. Reviewed healthy boundaries and ways in which to communicate these with her family. Used motivational interviewing to assist and encourage patient through the change process. Explored patients barriers to change. Reviewed patients self care plan. Assessed her progress related to self care. Patient's self care is good. Recommend proper diet, regular exercise, socialization and recreation.   Plan: Return again in one weeks.  Diagnosis: Axis I: Major Depression, Recurrent severe and Post Traumatic Stress  Disorder    Axis II: No diagnosis    Darcel Zick, LCSW 01/02/2012

## 2012-01-09 ENCOUNTER — Ambulatory Visit (INDEPENDENT_AMBULATORY_CARE_PROVIDER_SITE_OTHER): Payer: 59 | Admitting: Licensed Clinical Social Worker

## 2012-01-09 ENCOUNTER — Encounter (HOSPITAL_COMMUNITY): Payer: Self-pay | Admitting: Licensed Clinical Social Worker

## 2012-01-09 DIAGNOSIS — F332 Major depressive disorder, recurrent severe without psychotic features: Secondary | ICD-10-CM

## 2012-01-09 DIAGNOSIS — F4310 Post-traumatic stress disorder, unspecified: Secondary | ICD-10-CM
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
## 2012-01-16 ENCOUNTER — Ambulatory Visit (INDEPENDENT_AMBULATORY_CARE_PROVIDER_SITE_OTHER): Payer: 59 | Admitting: Licensed Clinical Social Worker

## 2012-01-16 DIAGNOSIS — F332 Major depressive disorder, recurrent severe without psychotic features: Secondary | ICD-10-CM

## 2012-01-16 NOTE — Progress Notes (Signed)
   THERAPIST PROGRESS NOTE  Session Time: 3:00pm-3:50pm  Participation Level: Active  Behavioral Response: Well GroomedAlertDepressed  Type of Therapy: Individual Therapy  Treatment Goals addressed: Coping  Interventions: CBT, Supportive, Reframing and Other: grief and loss  Summary: Lisa [name] is a 36 y.o. female who presents with depressed mood and flat affect. She reports a very difficult week, with increased agitation and anger. She processes her frustration with co-workers, family members and friends who have unrealistic expectations of her given her grief and loss. She feels overwhelmed and less compassionate. She engages in defensive conversations with family and friends when she is judged. She is pleased that she has not heard anything from her husband, but does process communication from his mother, which was hurtful. Her sleep is poor, only two to three hours per night. Her appetite remains increased.    Suicidal/Homicidal: Nowithout intent/plan  Therapist Response: Processed w/pt her sadness and anger. Normalized her anger and encouraged continued expression of feelings. Provided feedback, challenging patients tendency to engage in unhealthy conversations with others. Explored negative consequences of this behavior. Reviewed healthy boundaries and reviewed assertive communication. She endorses a strong desire for SIB, but she has restrained herself. Reviewed crisis plan and patient commits to safety. Used CBT to assist patient with the identification of her negative distortions and irrational thoughts. Encouraged patient to verbalize alternative and factual responses which challenge her distortions. Reviewed patients self care plan. Assessed her progress related to self care. Patient's self care is improving. Recommend proper diet, regular exercise, socialization and recreation.   Plan: Return again in two days.  Diagnosis: Axis I: Major Depression, Recurrent severe and Post  Traumatic Stress Disorder    Axis II: No diagnosis    Chrishawn Kring, LCSW 01/16/2012

## 2012-01-18 ENCOUNTER — Ambulatory Visit (INDEPENDENT_AMBULATORY_CARE_PROVIDER_SITE_OTHER): Payer: 59 | Admitting: Licensed Clinical Social Worker

## 2012-01-18 DIAGNOSIS — F4310 Post-traumatic stress disorder, unspecified: Secondary | ICD-10-CM

## 2012-01-18 DIAGNOSIS — F332 Major depressive disorder, recurrent severe without psychotic features: Secondary | ICD-10-CM

## 2012-01-18 NOTE — Progress Notes (Signed)
   THERAPIST PROGRESS NOTE  Session Time: 9:30am-10:20am  Participation Level: Active  Behavioral Response: Well GroomedAlertAnxious and Depressed  Type of Therapy: Individual Therapy  Treatment Goals addressed: Anxiety and Coping  Interventions: CBT, Motivational Interviewing, Supportive and Reframing  Summary: Lisa [name] is a 36 y.o. female who presents with depressed mood and anxious affect. She endorses suicidal ideation and a strong desire for SIB. She has been able to restrain herself from cutting and is focused on getting more tatoo's. She is tearful when processing her grief and loss associated with her divorce. She questions how her husband has been able to leave, start over with another woman and focus on her children so quickly. She does not feel that she knows her husband and questions her judgment. She found out that he did not pay her student loans for the past two months. She continues to be hurt by her family and friends who challenge her, put her down and tell her she made and continues to make wrong decisions. Her sleep has slightly improved and her appetite remains increased.    Suicidal/Homicidal: Nowithout intent/plan  Therapist Response: Assessed patients current functioning and reviewed her progress. Assessed her safety and patient is safe, able to commit to safety, has a clear safety plan of calling this therapist or [facility]. While she endorses fleeting suicidal ideation, she denies a plan or intent. Her protective factors are her family. Processed patients responsibility to set clear boundaries in her life and how her negative self talk impedes her ability to do this. Used CBT to assist patient with the identification of her negative distortions and irrational thoughts. Encouraged patient to verbalize alternative and factual responses which challenge her distortions. Used motivational interviewing to assist and encourage patient through the change process. Explored patients  barriers to change. Reviewed patients self care plan. Assessed her progress related to self care. Patient's self care is good. Recommend proper diet, regular exercise, socialization and recreation.   Plan: Return again in one weeks.  Diagnosis: Axis I: Major Depression, Recurrent severe and Post Traumatic Stress Disorder    Axis II: No diagnosis    Sameera Betton, LCSW 01/18/2012

## 2012-01-21 ENCOUNTER — Ambulatory Visit (HOSPITAL_COMMUNITY): Payer: Self-pay | Admitting: Licensed Clinical Social Worker

## 2012-01-23 ENCOUNTER — Ambulatory Visit (INDEPENDENT_AMBULATORY_CARE_PROVIDER_SITE_OTHER): Payer: 59 | Admitting: Licensed Clinical Social Worker

## 2012-01-23 DIAGNOSIS — F4310 Post-traumatic stress disorder, unspecified: Secondary | ICD-10-CM

## 2012-01-23 DIAGNOSIS — F332 Major depressive disorder, recurrent severe without psychotic features: Secondary | ICD-10-CM

## 2012-01-24 NOTE — Progress Notes (Signed)
   THERAPIST PROGRESS NOTE  Session Time: 3:00pm-3:50pm  Participation Level: Active  Behavioral Response: Well GroomedAlertDepressed and Hopeless  Type of Therapy: Individual Therapy  Treatment Goals addressed: Coping  Interventions: CBT, DBT, Strength-based and Reframing  Summary: Lisa [name] is a 36 y.o. female who presents with depressed mood and flat affect. She reports ongoing depression, anxiety, feelings of hopelessness and helplessness about her life. She has a strong desire to cut, but has been able to redirect herself and has been working long hours as a means of distraction. She processes her deep sadness and struggle to battle the negative internal dialogue focusing in her worthlessness and that she does not deserve anything good in life. She continues to engage in defensive conversation with family members and friends when they accuse and judge her. Her sleep remains inconsistent, but she has been able to sleep well over the past few days. She continues to eat more to mange her emotions, but has become more aware of this pattern and is trying to break it.   Suicidal/Homicidal: Nowithout intent/plan  Therapist Response: Assessed patients current functioning and reviewed progress. Used DBT to help patient practice mindfulness, review distraction list and improve distress tolerance skills. Her thinking is negative and distorted and she becomes angry when given feedback about her hopelessness. Used CBT to assist patient with the identification of her negative distortions and irrational thoughts. Encouraged patient to verbalize alternative and factual responses which challenge her distortions. Processed and normalized her grief reaction. Explored her feelings of betrayal. Reviewed patients self care plan. Assessed her progress related to self care. Patient's self care is good. Recommend proper diet, regular exercise, socialization and recreation.   Plan: Return again in two  days.  Diagnosis: Axis I: Major Depression, Recurrent severe and Post Traumatic Stress Disorder    Axis II: No diagnosis    Buffie Herne, LCSW 01/24/2012

## 2012-01-25 ENCOUNTER — Ambulatory Visit (INDEPENDENT_AMBULATORY_CARE_PROVIDER_SITE_OTHER): Payer: 59 | Admitting: Licensed Clinical Social Worker

## 2012-01-25 DIAGNOSIS — F332 Major depressive disorder, recurrent severe without psychotic features: Secondary | ICD-10-CM

## 2012-01-25 DIAGNOSIS — F4310 Post-traumatic stress disorder, unspecified: Secondary | ICD-10-CM

## 2012-01-25 NOTE — Progress Notes (Signed)
   THERAPIST PROGRESS NOTE  Session Time: 9 ;30am-10 ;30am  Participation Level: Active  Behavioral Response: Well GroomedAlertDepressed  Type of Therapy: Individual Therapy  Treatment Goals addressed: Coping  Interventions: CBT, DBT, Supportive and Reframing  Summary: Lisa [name] is a 36 y.o. female who presents with depressed mood and flat affect. She has not cut or injured herself since the last session, but continues to feel a strong urge to do so. She endorses suicidal ideation, without intent or a plan and her protective factors which she clearly communicates are her family and friends. She processes her shock over her marriage ending and that she will be going to court later this month to ask for a divorce. She has not heard from her husband and has been diligent about protecting herself from communication with him, such as Face Book. She continues to use work as a means to channel her stress and provide distraction. She has slept well since her last session and her appetite is leveling off.    Suicidal/Homicidal: Yeswithout intent/plan  Therapist Response: Assessed patients current functioning and reviewed progress. Assessed patients safety, reviewed her crisis plan which involves contacting this therapist, her old therapist and going to the emergency room. Processed and normalized her grief. Used DBT to practice mindfulness, review distraction techniques and build distress tolerance. Practiced assertive communication with patient. Used CBT to assist patient with the identification of her negative distortions and irrational thoughts. Encouraged patient to verbalize alternative and factual responses which challenge her distortions. Reviewed patients self care plan. Assessed her progress related to self care. Patient's self care is fair. Recommend proper diet, regular exercise, socialization and recreation.   Plan: Return again in one weeks.  Diagnosis: Axis I: Major Depression,  Recurrent severe and Post Traumatic Stress Disorder    Axis II: No diagnosis    Jazen Spraggins, LCSW 01/25/2012

## 2012-01-30 ENCOUNTER — Ambulatory Visit (INDEPENDENT_AMBULATORY_CARE_PROVIDER_SITE_OTHER): Payer: 59 | Admitting: Licensed Clinical Social Worker

## 2012-01-30 DIAGNOSIS — F332 Major depressive disorder, recurrent severe without psychotic features: Secondary | ICD-10-CM

## 2012-01-30 DIAGNOSIS — F4310 Post-traumatic stress disorder, unspecified: Secondary | ICD-10-CM
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
## 2012-02-01 ENCOUNTER — Ambulatory Visit (INDEPENDENT_AMBULATORY_CARE_PROVIDER_SITE_OTHER): Payer: 59 | Admitting: Licensed Clinical Social Worker

## 2012-02-01 DIAGNOSIS — F332 Major depressive disorder, recurrent severe without psychotic features: Secondary | ICD-10-CM

## 2012-02-01 DIAGNOSIS — F4310 Post-traumatic stress disorder, unspecified: Secondary | ICD-10-CM

## 2012-02-01 NOTE — Progress Notes (Signed)
   THERAPIST PROGRESS NOTE  Session Time: 8:30am-9:20am  Participation Level: Active  Behavioral Response: Well GroomedAlertDepressed  Type of Therapy: Individual Therapy  Treatment Goals addressed: Coping  Interventions: CBT, DBT, Supportive and Reframing  Summary: Lisa [name] is a 36 y.o. female who presents with depressed mood and flat affect. She is pleased to report that she slept last night, but took 30mg s of Valium and 200mg  of Trazadone in order to sleep. She feels rested and did not wake up with nightmares of trauma. She continues to fight against strong feelings to hurt herself by cutting and suicidal ideation. She is frustrated that she wants to die and feels hopeless about her life, because she states that "I am not going to kill myself, but I am tired of feeling like I want to die". Her family is her main protective factor and she does not want to hurt them by committing suicide. She is frustrated by her belief that she is "broken" and is unable to think of herself in any other way besides this and she understands that until she can begin to accept herself with love, she is unable to make significant progress.   Suicidal/Homicidal: Nowithout intent/plan  Therapist Response: Assessed patients current functioning and reviewed progress. Reviewed coping strategies. Assessed patients safety and assisted in identifying protective factors.  Reviewed crisis plan with patient. Assisted patient with the expression of her feelings of depression, frustration and anxiety. Explored her core belief that she is broken. Used CBT to assist patient with the identification of negative distortions and irrational thoughts. Encouraged patient to verbalize alternative and factual responses which challenge thought distortions. Used DBT to practice mindfulness, review distraction list and improve distress tolerance skills. Reviewed patients self care plan. Assessed  progress related to self care. Patient's  self care is good. Recommend proper diet, regular exercise, socialization and recreation.   Plan: Return again in one weeks.  Diagnosis: Axis I: Major Depression, Recurrent severe and Post Traumatic Stress Disorder    Axis II: No diagnosis    Juelz Whittenberg, LCSW 02/01/2012

## 2012-02-04 ENCOUNTER — Encounter (HOSPITAL_COMMUNITY): Payer: Self-pay | Admitting: Licensed Clinical Social Worker

## 2012-02-04 ENCOUNTER — Ambulatory Visit (INDEPENDENT_AMBULATORY_CARE_PROVIDER_SITE_OTHER): Payer: 59 | Admitting: Licensed Clinical Social Worker

## 2012-02-04 DIAGNOSIS — F4310 Post-traumatic stress disorder, unspecified: Secondary | ICD-10-CM

## 2012-02-04 DIAGNOSIS — F332 Major depressive disorder, recurrent severe without psychotic features: Secondary | ICD-10-CM
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
## 2012-02-06 ENCOUNTER — Ambulatory Visit (HOSPITAL_COMMUNITY): Payer: Self-pay | Admitting: Licensed Clinical Social Worker

## 2012-02-08 ENCOUNTER — Ambulatory Visit (INDEPENDENT_AMBULATORY_CARE_PROVIDER_SITE_OTHER): Payer: 59 | Admitting: Licensed Clinical Social Worker

## 2012-02-08 DIAGNOSIS — F332 Major depressive disorder, recurrent severe without psychotic features: Secondary | ICD-10-CM

## 2012-02-08 DIAGNOSIS — F4310 Post-traumatic stress disorder, unspecified: Secondary | ICD-10-CM

## 2012-02-08 NOTE — Progress Notes (Signed)
   THERAPIST PROGRESS NOTE  Session Time: 4:00pm-4:50pm  Participation Level: Active  Behavioral Response: Well GroomedAlertDepressed  Type of Therapy: Individual Therapy  Treatment Goals addressed: Coping  Interventions: CBT, Motivational Interviewing and Reframing  Summary: Lisa [name] is a 36 y.o. female who presents with depressed mood and flat affect. She reports small improvement in her degree of depression. She feels more indifferent lately, with decreased tolerance for peers at work. She describes recent conflict at work and her subsequent confusion over how she is perceived. She has focused more intently on noticing her automatic negative and distorted thoughts supporting her self hatred and has worked to simply accept a compliment. She denies any suicidal ideation and feels that the recent increase in Zoloft is helping. She reports very poor motivation regarding exercise. She has focused more intently on her eating habits. Her sleep remains poor due to nightmares.   Suicidal/Homicidal: Nowithout intent/plan  Therapist Response: Assessed patients current functioning and reviewed progress. Reviewed coping strategies. Assessed patients safety and assisted in identifying protective factors.  Reviewed crisis plan with patient. Assisted patient with the expression of her feelings of frustration, anxiety and depression. Used CBT to assist patient with the identification of negative distortions and irrational thoughts. Encouraged patient to verbalize alternative and factual responses which challenge thought distortions. Used DBT to practice mindfulness, review distraction list and improve distress tolerance skills. Reviewed patients self care plan. Assessed  progress related to self care. Patient's self care is improving. Recommend proper diet, regular exercise, socialization and recreation. Used motivational interviewing to assist and encourage patient through the change process. Explored  patients barriers to change.    Plan: Return again in one weeks.  Diagnosis: Axis I: Major Depression, Recurrent severe and Post Traumatic Stress Disorder    Axis II: No diagnosis    Magnolia Mattila, LCSW 02/08/2012

## 2012-02-13 ENCOUNTER — Ambulatory Visit (INDEPENDENT_AMBULATORY_CARE_PROVIDER_SITE_OTHER): Payer: 59 | Admitting: Licensed Clinical Social Worker

## 2012-02-13 DIAGNOSIS — F332 Major depressive disorder, recurrent severe without psychotic features: Secondary | ICD-10-CM

## 2012-02-13 NOTE — Progress Notes (Signed)
   THERAPIST PROGRESS NOTE  Session Time: 3:00pm-3:50pm  Participation Level: Active  Behavioral Response: Well GroomedAlertEuthymic  Type of Therapy: Individual Therapy  Treatment Goals addressed: Coping  Interventions: CBT, Motivational Interviewing and Reframing  Summary: Lisa [name] is a 36 y.o. female who presents with euthymic mood and bright affect. She reports improvement in her mood and is bright, making jokes in session today. She is pleased to be feeling better and sleeping well. Since she had her tattoo filled in on Saturday, and she found the pain helpful, she has been sleeping seven hours per night uninterrupted. She endorses some anxiety and anger related to the upcoming court hearing for her divorce on Monday. She processes her anger and the recent text she received from her husband asking her if she had second thoughts. She feels her life is calmer now that her husband has left and she processes how much time and energy she spent focusing on his problems. She denies any negative self talk, suicidal ideation or desire for self harm.   Suicidal/Homicidal: Nowithout intent/plan  Therapist Response: Assessed patients current functioning and reviewed progress. Reviewed coping strategies. Assessed patients safety and assisted in identifying protective factors.  Reviewed crisis plan with patient. Assisted patient with the expression of her feelings of anger with her husband. Processed and normalized her grief reaction. She has not started exercising and does not feel motivated. Used motivational interviewing to assist and encourage patient through the change process. Explored patients barriers to change. Used DBT to practice mindfulness, review distraction list and improve distress tolerance skills. Reviewed patients self care plan. Assessed  progress related to self care. Patient's self care is good. Recommend proper diet, regular exercise, socialization and recreation.   Plan:  Return again in one weeks.  Diagnosis: Axis I: Major Depression, Recurrent severe and Post Traumatic Stress Disorder    Axis II: No diagnosis    Jayln Madeira, LCSW 02/13/2012

## 2012-02-15 ENCOUNTER — Ambulatory Visit (HOSPITAL_COMMUNITY): Payer: Self-pay | Admitting: Licensed Clinical Social Worker

## 2012-02-20 ENCOUNTER — Ambulatory Visit (INDEPENDENT_AMBULATORY_CARE_PROVIDER_SITE_OTHER): Payer: 59 | Admitting: Licensed Clinical Social Worker

## 2012-02-20 ENCOUNTER — Encounter (HOSPITAL_COMMUNITY): Payer: Self-pay | Admitting: Licensed Clinical Social Worker

## 2012-02-20 DIAGNOSIS — F332 Major depressive disorder, recurrent severe without psychotic features: Secondary | ICD-10-CM

## 2012-02-20 DIAGNOSIS — F4310 Post-traumatic stress disorder, unspecified: Secondary | ICD-10-CM
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
## 2012-02-22 ENCOUNTER — Ambulatory Visit (HOSPITAL_COMMUNITY): Payer: Self-pay | Admitting: Licensed Clinical Social Worker

## 2012-02-27 ENCOUNTER — Encounter (HOSPITAL_COMMUNITY): Payer: Self-pay | Admitting: Licensed Clinical Social Worker

## 2012-02-27 ENCOUNTER — Ambulatory Visit (INDEPENDENT_AMBULATORY_CARE_PROVIDER_SITE_OTHER): Payer: 59 | Admitting: Licensed Clinical Social Worker

## 2012-02-27 DIAGNOSIS — F332 Major depressive disorder, recurrent severe without psychotic features: Secondary | ICD-10-CM

## 2012-02-27 DIAGNOSIS — F4310 Post-traumatic stress disorder, unspecified: Secondary | ICD-10-CM
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
## 2012-03-05 ENCOUNTER — Ambulatory Visit (INDEPENDENT_AMBULATORY_CARE_PROVIDER_SITE_OTHER): Payer: 59 | Admitting: Licensed Clinical Social Worker

## 2012-03-05 DIAGNOSIS — F332 Major depressive disorder, recurrent severe without psychotic features: Secondary | ICD-10-CM

## 2012-03-05 DIAGNOSIS — F4310 Post-traumatic stress disorder, unspecified: Secondary | ICD-10-CM

## 2012-03-05 NOTE — Progress Notes (Signed)
   THERAPIST PROGRESS NOTE  Session Time: 3:00pm-3:50pm  Participation Level: Active  Behavioral Response: Well GroomedAlertEuthymic  Type of Therapy: Individual Therapy  Treatment Goals addressed: Coping  Interventions: CBT, Supportive and Reframing  Summary: Lisa [name] is a 36 y.o. female who presents with euthymic mood and tired affect. She got her period and reports increased nightmares and flashbacks as a result of the bleeding. She has not been sleeping well and is frustrated. She reports continued improvement in her depression, panic attacks and anxiety. She processes her grief reaction over her divorce and examines what she misses about being married, which is touch. She endorses feelings of loneliness and is trying to remain active with friends and family. She continues to exercise daily with her father and feels safe. She is making progress related to setting healthier boundaries with family and friends and plans to be away when her ex-husband returns to gather more of his belongings.    Suicidal/Homicidal: Nowithout intent/plan  Therapist Response: Assessed patients current functioning and reviewed progress. Reviewed coping strategies. Assessed patients safety and assisted in identifying protective factors.  Reviewed crisis plan with patient. Assisted patient with the expression of her feelings of frustration and grief. Used CBT to assist patient with the identification of negative distortions and irrational thoughts. Encouraged patient to verbalize alternative and factual responses which challenge thought distortions. She denies any self harm and denies any desire to cut. Used DBT to practice mindfulness, review distraction list and improve distress tolerance skills. Reviewed healthy boundaries and the expression of them. Reviewed patients self care plan. Assessed  progress related to self care. Patient's self care is improving. Recommend proper diet, regular exercise, socialization  and recreation.  Plan: Return again in one weeks.  Diagnosis: Axis I: Major Depression, Recurrent severe and Post Traumatic Stress Disorder    Axis II: No diagnosis    Abeer Deskins, LCSW 03/05/2012

## 2012-03-12 ENCOUNTER — Ambulatory Visit (INDEPENDENT_AMBULATORY_CARE_PROVIDER_SITE_OTHER): Payer: 59 | Admitting: Licensed Clinical Social Worker

## 2012-03-12 DIAGNOSIS — F4310 Post-traumatic stress disorder, unspecified: Secondary | ICD-10-CM

## 2012-03-12 DIAGNOSIS — F332 Major depressive disorder, recurrent severe without psychotic features: Secondary | ICD-10-CM
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
## 2012-03-19 ENCOUNTER — Ambulatory Visit (INDEPENDENT_AMBULATORY_CARE_PROVIDER_SITE_OTHER): Payer: 59 | Admitting: Licensed Clinical Social Worker

## 2012-03-19 DIAGNOSIS — F332 Major depressive disorder, recurrent severe without psychotic features: Secondary | ICD-10-CM

## 2012-03-19 NOTE — Progress Notes (Signed)
   THERAPIST PROGRESS NOTE  Session Time: 3:00pm-3:50pm  Participation Level: Active  Behavioral Response: Well GroomedAlertEuthymic  Type of Therapy: Individual Therapy  Treatment Goals addressed: Coping  Interventions: CBT, DBT, Strength-based, Supportive and Reframing  Summary: Lisa [name] is a 36 y.o. female who presents with euthymic mood and affect. She reports continued and sustained improvement in her depression and anxiety, but endorses continued nightmares and poor sleep as a result. She processes her realization that her depression is improving because of the demise of her marriage. She did not realize how much she was affected by the negativity of her husband until now. Her confidence at work is growing and she demonstrates improvement in her style of communication. She wants to develop as much confidence in herself personally as she demonstrates at work. She continues to explore how she can develop value in herself. She denies any suicidal ideation, intent or plan and denies any temptation for SIB.   Suicidal/Homicidal: Nowithout intent/plan  Therapist Response: Assessed patients current functioning and reviewed progress. Reviewed coping strategies. Assessed patients safety and assisted in identifying protective factors.  Reviewed crisis plan with patient. Assisted patient with the expression of her feelings of frustration. Processed and normalized patients grief reaction and explored her expectations. Used CBT to assist patient with the identification of negative distortions and irrational thoughts. Encouraged patient to verbalize alternative and factual responses which challenge thought distortions. Used DBT to practice mindfulness, review distraction list and improve distress tolerance skills. Used motivational interviewing to assist and encourage patient through the change process. Explored patients barriers to change. Reviewed patients self care plan. Assessed  progress related  to self care. Patient's self care is good. Recommend proper diet, regular exercise, socialization and recreation.   Plan: Return again in one weeks.  Diagnosis: Axis I: Major Depression, Recurrent severe and Post Traumatic Stress Disorder    Axis II: No diagnosis    Brylin Stanislawski, LCSW 03/19/2012

## 2012-03-26 ENCOUNTER — Ambulatory Visit (INDEPENDENT_AMBULATORY_CARE_PROVIDER_SITE_OTHER): Payer: 59 | Admitting: Licensed Clinical Social Worker

## 2012-03-26 DIAGNOSIS — F332 Major depressive disorder, recurrent severe without psychotic features: Secondary | ICD-10-CM
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
## 2012-04-02 ENCOUNTER — Ambulatory Visit (INDEPENDENT_AMBULATORY_CARE_PROVIDER_SITE_OTHER): Payer: 59 | Admitting: Licensed Clinical Social Worker

## 2012-04-02 DIAGNOSIS — F4310 Post-traumatic stress disorder, unspecified: Secondary | ICD-10-CM

## 2012-04-02 DIAGNOSIS — F332 Major depressive disorder, recurrent severe without psychotic features: Secondary | ICD-10-CM
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
## 2012-04-09 ENCOUNTER — Encounter (HOSPITAL_COMMUNITY): Payer: Self-pay | Admitting: Licensed Clinical Social Worker

## 2012-04-09 ENCOUNTER — Ambulatory Visit (INDEPENDENT_AMBULATORY_CARE_PROVIDER_SITE_OTHER): Payer: 59 | Admitting: Licensed Clinical Social Worker

## 2012-04-09 DIAGNOSIS — F332 Major depressive disorder, recurrent severe without psychotic features: Secondary | ICD-10-CM

## 2012-04-09 DIAGNOSIS — F4310 Post-traumatic stress disorder, unspecified: Secondary | ICD-10-CM

## 2012-04-09 NOTE — Progress Notes (Signed)
   THERAPIST PROGRESS NOTE  Session Time: 3:00pm-3:50pm  Participation Level: Active  Behavioral Response: Well GroomedAlertDepressed  Type of Therapy: Individual Therapy  Treatment Goals addressed: Coping  Interventions: CBT, DBT, Motivational Interviewing, Supportive and Reframing  Summary: Lisa [name] is a 36 y.o. female who presents with depressed mood and flat affect. She reports ongoing sadness and anger related to her divorce. Tomorrow would have been her five year wedding anniversary and she processes her sadness that her life is not where she wanted it to be or expected it to be. She is fearful to "grive too much" because she is afraid that she will become more depressed. She is not sure how she will spend the day tomorrow outside of work, but wants to focus on distracting herself. Work is going well. 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 She continues to experience nightmares with poor sleep as a result. Her appetite remains high and she is trying to control this. She remains unmotivated to exercise.  She reports no episodes of SIB.  Suicidal/Homicidal: Nowithout intent/plan  Therapist Response: Assessed patients current functioning and reviewed progress. Reviewed coping strategies. Assessed patients safety and assisted in identifying protective factors.  Reviewed crisis plan with patient. Assisted patient with the expression of her feelings of grief and anger regarding her divorce. Processed and normalized patients grief reaction. Discussed ways in which to manage her stress and sadness tomorrow. Used CBT to assist patient with the identification of negative distortions and irrational thoughts. Encouraged patient to verbalize alternative and factual responses which challenge thought distortions. Used DBT to practice mindfulness, review distraction list and improve distress tolerance skills. Used motivational interviewing to assist and  encourage patient through the change process. Explored patients barriers to change. Reviewed patients self care plan. Assessed  progress related to self care. Patient's self care is improving. Recommend proper diet, regular exercise, socialization and recreation.   Plan: Return again in one weeks.  Diagnosis: Axis I: Major Depression, Recurrent severe and Post Traumatic Stress Disorder    Axis II: No diagnosis    Onica Davidovich, LCSW 04/09/2012

## 2012-04-16 ENCOUNTER — Ambulatory Visit (INDEPENDENT_AMBULATORY_CARE_PROVIDER_SITE_OTHER): Payer: 59 | Admitting: Licensed Clinical Social Worker

## 2012-04-16 DIAGNOSIS — F332 Major depressive disorder, recurrent severe without psychotic features: Secondary | ICD-10-CM

## 2012-04-16 DIAGNOSIS — F4310 Post-traumatic stress disorder, unspecified: Secondary | ICD-10-CM

## 2012-04-16 NOTE — Progress Notes (Signed)
   THERAPIST PROGRESS NOTE  Session Time: 4:00pm-4:50pm  Participation Level: Active  Behavioral Response: Well GroomedAlertAnxious, Depressed and Irritable  Type of Therapy: Individual Therapy  Treatment Goals addressed: Coping  Interventions: CBT, DBT, Supportive and Reframing  Summary: Lisa [name] is a 36 y.o. female who presents with irritable mood and affect. 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 She is angry that she is being micro managed and is uncertain how she is supposed to respond to her new boss. She wants to apply for a promotion and is excited about the possibility, but is concerned that she will be shut out as she normally is. Her sleep remains poor, she continues to over eat and she is not motivated to exercise. She does take her dog for walks and is going to try to improve her motivation. She has not had any dissociative epiosides nor has she had any desire for SIB.    Suicidal/Homicidal: Nowithout intent/plan  Therapist Response: Assessed patients current functioning and reviewed progress. Reviewed coping strategies. Assessed patients safety and assisted in identifying protective factors.  Reviewed crisis plan with patient. Assisted patient with the expression of her feelings of anger and frustration. Used CBT to assist patient with the identification of negative distortions and irrational thoughts. Encouraged patient to verbalize alternative and factual responses which challenge thought distortions. Used DBT to practice mindfulness, review distraction list and improve distress tolerance skills. Used motivational interviewing to assist and encourage patient through the change process. Explored patients barriers to change. Reviewed patients self care plan. Assessed  progress related to self care. Patient's self care is fair. Recommend proper diet, regular exercise, socialization and recreation.   Plan: Return again in one weeks.  Diagnosis: Axis I:  Major Depression, Recurrent severe and Post Traumatic Stress Disorder    Axis II: No diagnosis    Braidan Ricciardi, LCSW 04/16/2012

## 2012-04-23 ENCOUNTER — Ambulatory Visit (HOSPITAL_COMMUNITY): Payer: Self-pay | Admitting: Licensed Clinical Social Worker

## 2012-04-30 ENCOUNTER — Encounter (HOSPITAL_COMMUNITY): Payer: Self-pay | Admitting: Licensed Clinical Social Worker

## 2012-04-30 ENCOUNTER — Ambulatory Visit (INDEPENDENT_AMBULATORY_CARE_PROVIDER_SITE_OTHER): Payer: 59 | Admitting: Licensed Clinical Social Worker

## 2012-04-30 DIAGNOSIS — F332 Major depressive disorder, recurrent severe without psychotic features: Secondary | ICD-10-CM

## 2012-04-30 NOTE — Progress Notes (Signed)
   THERAPIST PROGRESS NOTE  Session Time: 3:00pm-3:50pm  Participation Level: Active  Behavioral Response: Well GroomedAlertEuthymic  Type of Therapy: Individual Therapy  Treatment Goals addressed: Coping  Interventions: CBT, DBT, Strength-based, Supportive and Reframing  Summary: Lisa [name] is a 36 y.o. female who presents with euthymic mood and bright affect. She reports doing well, with well controlled depression and anxiety. She does endorse an increase in irritability, but has been able to control this. She has applied for a promotion at work and is hopeful but if she doesn't get it, she may look for another job. She has begun dating and processes how "weird" this is for her. She is not accustomed to being treated well and being complimented. She is developing improved insight into her relationship with her ex-husband and why the relationship failed. Her confidence has improved and she likes her body. She can speak positively about herself. Her sleep has improved and her appetite is wnl.    Suicidal/Homicidal: Nowithout intent/plan  Therapist Response: Assessed patients current functioning and reviewed progress. Reviewed coping strategies. Assessed patients safety and assisted in identifying protective factors.  Reviewed crisis plan with patient. Assisted patient with the expression of her feelings of frustration with her job. Used CBT to assist patient with the identification of negative distortions and irrational thoughts. Encouraged patient to verbalize alternative and factual responses which challenge thought distortions. Used DBT to practice mindfulness, review distraction list and improve distress tolerance skills. She denies any desire for SIB. Used motivational interviewing to assist and encourage patient through the change process. Explored patients barriers to change. Reviewed patients self care plan. Assessed  progress related to self care. Patient's self care is good. Recommend  proper diet, regular exercise, socialization and recreation.   Plan: Return again in one weeks.  Diagnosis: Axis I: Major Depression, Recurrent severe    Axis II: No diagnosis    [name], LCSW 04/30/2012

## 2012-05-07 ENCOUNTER — Ambulatory Visit (INDEPENDENT_AMBULATORY_CARE_PROVIDER_SITE_OTHER): Payer: 59 | Admitting: Licensed Clinical Social Worker

## 2012-05-07 ENCOUNTER — Encounter (HOSPITAL_COMMUNITY): Payer: Self-pay | Admitting: Licensed Clinical Social Worker

## 2012-05-07 DIAGNOSIS — F332 Major depressive disorder, recurrent severe without psychotic features: Secondary | ICD-10-CM

## 2012-05-07 NOTE — Progress Notes (Signed)
   THERAPIST PROGRESS NOTE  Session Time: 3:00pm-3:50pm  Participation Level: Active  Behavioral Response: Well GroomedAlertIrritable  Type of Therapy: Individual Therapy  Treatment Goals addressed: Coping  Interventions: CBT, Motivational Interviewing, Solution Focused, Supportive and Reframing  Summary: Lisa [name] is a 36 y.o. female who presents with irritable mood and affect. She is upset over circumstances at her job and describes being given a verbal warning for making a decision about how to use her time. She expresses her anger and frustration working under conditions which are inconsistent and unpredictable. She plans to look for a new job even though she does not want to leave this company but feels she has no other choice. She continues to date and is enjoying this, but feels uncomfortable. Her sleep is disrupted by racing thoughts related to her job. She continues to overeat as a means to manage her stress and has begun doing some simple exercises. She lacks motivation to increase her activity level. She denies any SIB, SI or HI.    Suicidal/Homicidal: Nowithout intent/plan  Therapist Response: Assessed patients current functioning and reviewed progress. Reviewed coping strategies. Assessed patients safety and assisted in identifying protective factors.  Reviewed crisis plan with patient. Assisted patient with the expression of her feelings of frustration and anger. Used CBT to assist patient with the identification of negative distortions and irrational thoughts. Encouraged patient to verbalize alternative and factual responses which challenge thought distortions. Used DBT to practice mindfulness, review distraction list and improve distress tolerance skills. Used motivational interviewing to assist and encourage patient through the change process. Explored patients barriers to change. Reviewed patients self care plan. Assessed  progress related to self care. Patient's self care is  good. Recommend proper diet, regular exercise, socialization and recreation.   Plan: Return again in one weeks.  Diagnosis: Axis I: Major Depression, Recurrent severe and Post Traumatic Stress Disorder    Axis II: No diagnosis    Mialani Reicks, LCSW 05/07/2012

## 2012-05-14 ENCOUNTER — Ambulatory Visit (INDEPENDENT_AMBULATORY_CARE_PROVIDER_SITE_OTHER): Payer: 59 | Admitting: Licensed Clinical Social Worker

## 2012-05-14 ENCOUNTER — Encounter (HOSPITAL_COMMUNITY): Payer: Self-pay | Admitting: Licensed Clinical Social Worker

## 2012-05-14 DIAGNOSIS — F332 Major depressive disorder, recurrent severe without psychotic features: Secondary | ICD-10-CM

## 2012-05-14 DIAGNOSIS — F4310 Post-traumatic stress disorder, unspecified: Secondary | ICD-10-CM

## 2012-05-14 NOTE — Progress Notes (Signed)
   THERAPIST PROGRESS NOTE  Session Time: 3:00pm-3:50pm  Participation Level: Active  Behavioral Response: Well GroomedAlertEuthymic  Type of Therapy: Individual Therapy  Treatment Goals addressed: Coping  Interventions: CBT, Motivational Interviewing, Strength-based, Supportive and Reframing  Summary: Lisa [name] is a 36 y.o. female who presents with euthymic mood and bright affect. She reports doing well and has had a very good week. She was asked to interview for the job she wants and feels positive she will get an offer. She is eager to do something different where her skills are appreciated. She had a wonderful time hosting her friend for a party and was upset when her parents came home and her mother criticized her to tears. She is frustrated that her mother has the ability to do this an she beats herself up over this.  She continues to date and enjoys this. She is realizing how much her marriage contributed to her depression. She is learning to accept compliments and to allow men to treat her well. Her sleep is disrupted but improving. Her appetite is wnl.   Suicidal/Homicidal: Nowithout intent/plan  Therapist Response: Assessed patients current functioning and reviewed progress. Reviewed coping strategies. Assessed patients safety and assisted in identifying protective factors.  Reviewed crisis plan with patient. Assisted patient with the expression of her feelings of frustration with her family. Challenged patients negative self talk about her reaction to her mother. Explored her progress of setting clear boundaries and asserting herself. Used CBT to assist patient with the identification of negative distortions and irrational thoughts. Encouraged patient to verbalize alternative and factual responses which challenge thought distortions. Used DBT to practice mindfulness, review distraction list and improve distress tolerance skills. She denies any SIB. Used motivational interviewing to  assist and encourage patient through the change process. Explored patients barriers to change. Reviewed patients self care plan. Assessed  progress related to self care. Patient's self care is good. Recommend proper diet, regular exercise, socialization and recreation.   Plan: Return again in one weeks.  Diagnosis: Axis I: Major Depression, Recurrent severe and Post Traumatic Stress Disorder    Axis II: No diagnosis    Payten Beaumier, LCSW 05/14/2012

## 2012-05-21 ENCOUNTER — Ambulatory Visit (INDEPENDENT_AMBULATORY_CARE_PROVIDER_SITE_OTHER): Payer: 59 | Admitting: Licensed Clinical Social Worker

## 2012-05-21 DIAGNOSIS — F332 Major depressive disorder, recurrent severe without psychotic features: Secondary | ICD-10-CM

## 2012-05-21 NOTE — Progress Notes (Signed)
   THERAPIST PROGRESS NOTE  Session Time: 3:00pm-3:50pm  Participation Level: Active  Behavioral Response: Well GroomedAlertIrritable  Type of Therapy: Individual Therapy  Treatment Goals addressed: Anxiety and Coping  Interventions: CBT, DBT, Motivational Interviewing, Supportive and Reframing  Summary: Lisa [name] is a 36 y.o. female who presents with euthymic mood and frustrated affect. She is frustrated with her job and has not heard back about getting the promotion yet. She is having conflict with her boss and is upset over this. She endorses over eating and has not motivation to stop or to exercise. She knows what she needs to do, but does not want to do it. She continues to date and enjoys this. Her sleep is disrupted and her appetite is increased. She denies any flashbacks or desire for SIB.    Suicidal/Homicidal: Nowithout intent/plan  Therapist Response: Assessed patients current functioning and reviewed progress. Reviewed coping strategies. Assessed patients safety and assisted in identifying protective factors.  Reviewed crisis plan with patient. Assisted patient with the expression of her feelings of frustration. Used CBT to assist patient with the identification of negative distortions and irrational thoughts. Encouraged patient to verbalize alternative and factual responses which challenge thought distortions. Used DBT to practice mindfulness, review distraction list and improve distress tolerance skills. Used motivational interviewing to assist and encourage patient through the change process. Explored patients barriers to change. Reviewed patients self care plan. Assessed  progress related to self care. Patient's self care is fair. Recommend proper diet, regular exercise, socialization and recreation.   Plan: Return again in one weeks.  Diagnosis: Axis I: Major Depression, Recurrent severe    Axis II: No diagnosis    Nikash Mortensen, LCSW 05/21/2012

## 2012-05-28 ENCOUNTER — Ambulatory Visit (INDEPENDENT_AMBULATORY_CARE_PROVIDER_SITE_OTHER): Payer: 59 | Admitting: Licensed Clinical Social Worker

## 2012-05-28 DIAGNOSIS — F332 Major depressive disorder, recurrent severe without psychotic features: Secondary | ICD-10-CM

## 2012-05-28 NOTE — Progress Notes (Signed)
   THERAPIST PROGRESS NOTE  Session Time: 4:00pm-4:50pm  Participation Level: Active  Behavioral Response: Well GroomedAlertAnxious and Depressed  Type of Therapy: Individual Therapy  Treatment Goals addressed: Coping  Interventions: CBT, Motivational Interviewing, Strength-based, Supportive and Reframing  Summary: Lisa [name] is a 36 y.o. female who presents with frustrated mood and affect. She did not get the job she applied for and is disappointed. She plans to look for another job outside of [employer] and endorses anxiety and fear related to this. She is scared about making such a big change, but realizes that the culture of this company is not healthy for her. She received flowers from her ex-husband on Thanksgiving and is confused by this. She is angry that he sent flowers now and did not do this while they were married. She continues to overeat, but is making a concerted effort to decrease this and she has begun taking the stairs at work as discussed in the last session. Her sleep is disrupted.    Suicidal/Homicidal: Nowithout intent/plan  Therapist Response: Assessed patients current functioning and reviewed progress. Reviewed coping strategies. Assessed patients safety and assisted in identifying protective factors.  Reviewed crisis plan with patient. Assisted patient with the expression of her feelings of frustration about her job. Used CBT to assist patient with the identification of negative distortions and irrational thoughts. Encouraged patient to verbalize alternative and factual responses which challenge thought distortions. Used DBT to practice mindfulness, review distraction list and improve distress tolerance skills. Used motivational interviewing to assist and encourage patient through the change process. Explored patients barriers to change. Reviewed patients self care plan. Assessed  progress related to self care. Patient's self care is good. Recommend proper diet, regular  exercise, socialization and recreation.   Plan: Return again in one weeks.  Diagnosis: Axis I: Major Depression, Recurrent severe    Axis II: No diagnosis    Anzal Bartnick, LCSW 05/28/2012

## 2012-06-04 ENCOUNTER — Ambulatory Visit (INDEPENDENT_AMBULATORY_CARE_PROVIDER_SITE_OTHER): Payer: 59 | Admitting: Licensed Clinical Social Worker

## 2012-06-04 DIAGNOSIS — F332 Major depressive disorder, recurrent severe without psychotic features: Secondary | ICD-10-CM
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
## 2012-06-11 ENCOUNTER — Ambulatory Visit (INDEPENDENT_AMBULATORY_CARE_PROVIDER_SITE_OTHER): Payer: 59 | Admitting: Licensed Clinical Social Worker

## 2012-06-11 DIAGNOSIS — F332 Major depressive disorder, recurrent severe without psychotic features: Secondary | ICD-10-CM
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
## 2012-06-16 ENCOUNTER — Ambulatory Visit (INDEPENDENT_AMBULATORY_CARE_PROVIDER_SITE_OTHER): Payer: 59 | Admitting: Licensed Clinical Social Worker

## 2012-06-16 DIAGNOSIS — F332 Major depressive disorder, recurrent severe without psychotic features: Secondary | ICD-10-CM
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
## 2012-06-26 ENCOUNTER — Ambulatory Visit (INDEPENDENT_AMBULATORY_CARE_PROVIDER_SITE_OTHER): Payer: 59 | Admitting: Licensed Clinical Social Worker

## 2012-06-26 DIAGNOSIS — F332 Major depressive disorder, recurrent severe without psychotic features: Secondary | ICD-10-CM

## 2012-06-26 NOTE — Progress Notes (Signed)
   THERAPIST PROGRESS NOTE  Session Time: 4:00pm-4:50pm  Participation Level: Active  Behavioral Response: Well GroomedAlertEuthymic  Type of Therapy: Individual Therapy  Treatment Goals addressed: Coping  Interventions: CBT, Motivational Interviewing, Strength-based, Supportive and Reframing  Summary: Lisa [name] is a 36 y.o. female who presents with euthymic mood and bright affect. She is sick and is recovering from a cold. She had a nice Christmas holiday and enjoyed her time with her family. She did hear from her ex-husband on Christmas and remains frustrated that he wants to remain in contact with her and has expectations that she will want to talk to him. She feels pressured by her father to look for and find a new job. She is fearful of making such a big change in light of her recent divorce and beginning dating again. She is afraid to leave a stable job despite the chaos and dysfunction there. She is fearful that she will find another job but that it will have the same problems as her current job. Her sleep remains disrupted and her appetite is increased. She has gained back nine pounds and struggles to stop snacking.    Suicidal/Homicidal: Nowithout intent/plan  Therapist Response: Assessed patients current functioning and reviewed progress. Reviewed coping strategies. Assessed patients safety and assisted in identifying protective factors.  Reviewed crisis plan with patient. Assisted patient with the expression of her feelings of anxiety and fear over a new job. Used CBT to assist patient with the identification of negative distortions and irrational thoughts. Encouraged patient to verbalize alternative and factual responses which challenge thought distortions. Used DBT to practice mindfulness, review distraction list and improve distress tolerance skills. Used motivational interviewing to assist and encourage patient through the change process. Explored patients barriers to change.  Reviewed patients self care plan. Assessed  progress related to self care. Patient's self care is good. Recommend proper diet, regular exercise, socialization and recreation.   Plan: Return again in one weeks.  Diagnosis: Axis I: Major Depression, Recurrent severe    Axis II: No diagnosis    Javionna Leder, LCSW 06/26/2012

## 2012-07-02 ENCOUNTER — Ambulatory Visit (INDEPENDENT_AMBULATORY_CARE_PROVIDER_SITE_OTHER): Payer: 59 | Admitting: Licensed Clinical Social Worker

## 2012-07-02 DIAGNOSIS — F332 Major depressive disorder, recurrent severe without psychotic features: Secondary | ICD-10-CM
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
## 2012-07-09 ENCOUNTER — Ambulatory Visit (HOSPITAL_COMMUNITY): Payer: Self-pay | Admitting: Licensed Clinical Social Worker

## 2012-07-10 ENCOUNTER — Ambulatory Visit (INDEPENDENT_AMBULATORY_CARE_PROVIDER_SITE_OTHER): Payer: 59 | Admitting: Licensed Clinical Social Worker

## 2012-07-10 ENCOUNTER — Ambulatory Visit (HOSPITAL_COMMUNITY): Payer: Self-pay | Admitting: Licensed Clinical Social Worker

## 2012-07-10 DIAGNOSIS — F332 Major depressive disorder, recurrent severe without psychotic features: Secondary | ICD-10-CM

## 2012-07-10 NOTE — Progress Notes (Signed)
   THERAPIST PROGRESS NOTE  Session Time: 2:00pm-2:50pm  Participation Level: Active  Behavioral Response: Well GroomedAlertAnxious and Depressed  Type of Therapy: Individual Therapy  Treatment Goals addressed: Coping  Interventions: CBT, Motivational Interviewing, Strength-based, Supportive and Reframing  Summary: Lisa [name] is a 37 y.o. female who presents with depressed mood and flat affect. She reports increased feelings of depression over the past week. She has her period and feels that some of this is hormonal. She is angry over a recent incident where a friend outed her as bisexual in front of six of her friends who did not know. She has received strong, negative feedback from one of these friends. Telling her she is going to hell, that this is why her marriage failed and why she does not have children. She is hurt and angry that she is found defending herself. She is hurt that a friend of two years would judge her so strongly. She is fearful that this is being spread as gossip around her job. She feels betrayed by her friends and feels unsafe at work. She does endorse passive suicidal ideation and passive ideation to cut herself. She has not harmed herself and is able to make a clear commitment to safety. She identifies her family as her primary protective factor, along with hope and knowledge that she will experience improvement in her mood. This weekend she has plans to be with her sister and travel to [place] to visit family. She will remain distracted. Her sleep is disrupted by nightmares. Her appetite is increased.   Suicidal/Homicidal: Yeswithout intent/plan  Therapist Response: Assessed patients current functioning and reviewed progress. Reviewed coping strategies. Assessed patients safety and assisted in identifying protective factors.  Reviewed crisis plan with patient. Assisted patient with the expression of her feelings of anger and depression. Used CBT to assist patient with  the identification of negative distortions and irrational thoughts. Encouraged patient to verbalize alternative and factual responses which challenge thought distortions. Used DBT to practice mindfulness, review distraction list and improve distress tolerance skills. Reviewed patients self care plan. Assessed  progress related to self care. Patient's self care is good. Recommend proper diet, regular exercise, socialization and recreation. Reviewed healthy boundaries and assertive communication. Processed and normalized patients grief reaction.   Plan: Return again in one weeks.  Diagnosis: Axis I: Major Depression, Recurrent severe    Axis II: No diagnosis    Brecken Walth, LCSW 07/10/2012

## 2012-07-11 ENCOUNTER — Ambulatory Visit (HOSPITAL_COMMUNITY): Payer: Self-pay | Admitting: Licensed Clinical Social Worker

## 2012-07-16 ENCOUNTER — Ambulatory Visit (INDEPENDENT_AMBULATORY_CARE_PROVIDER_SITE_OTHER): Payer: 59 | Admitting: Licensed Clinical Social Worker

## 2012-07-16 DIAGNOSIS — F332 Major depressive disorder, recurrent severe without psychotic features: Secondary | ICD-10-CM

## 2012-07-16 NOTE — Progress Notes (Signed)
   THERAPIST PROGRESS NOTE  Session Time: 3:00pm-3:50pm  Participation Level: Active  Behavioral Response: Well GroomedLethargicDepressed  Type of Therapy: Individual Therapy  Treatment Goals addressed: Coping  Interventions: CBT, DBT, Motivational Interviewing, Strength-based, Supportive and Reframing  Summary: Lisa [name] is a 37 y.o. female who presents with depressed mood and flat affect. Her depression has improved slightly since last week, but she endorses an increase in anxiety with panic attacks. She is trying to do everything she knows to do to help herself and remains frustrated that she is still depressed. She struggles with her desire to have a child while others around her are pregnant. She is grieving the dream of her marriage and the future she envisioned. She endorses passive suicidal thoughts, without a plan or intent. She endorses thoughts of SIB, but she has not acted upon them and uses CBT and DBT to redirect herself and keep herself safe. Her sleep is inconsistent and her appetite is wnl.   Suicidal/Homicidal: Nowithout intent/plan  Therapist Response: Assessed patients current functioning and reviewed progress. Reviewed coping strategies. Assessed patients safety and assisted in identifying protective factors.  Reviewed crisis plan with patient. Used CBT to assist patient with the identification of negative distortions and irrational thoughts. Encouraged patient to verbalize alternative and factual responses which challenge thought distortions. Assisted patient with the expression of her feelings of sadness and grief. Used DBT to practice mindfulness, review distraction list and improve distress tolerance skills. Used motivational interviewing to assist and encourage patient through the change process. Explored patients barriers to change. Reviewed patients self care plan. Assessed  progress related to self care. Patient's self care is good. Recommend proper diet, regular  exercise, socialization and recreation.   Plan: Return again in one weeks.  Diagnosis: Axis I: Major Depression, Recurrent severe    Axis II: No diagnosis    Nemesio Castrillon, LCSW 07/16/2012

## 2012-07-23 ENCOUNTER — Ambulatory Visit (INDEPENDENT_AMBULATORY_CARE_PROVIDER_SITE_OTHER): Payer: 59 | Admitting: Licensed Clinical Social Worker

## 2012-07-23 ENCOUNTER — Ambulatory Visit (HOSPITAL_COMMUNITY): Payer: Self-pay | Admitting: Licensed Clinical Social Worker

## 2012-07-23 DIAGNOSIS — F332 Major depressive disorder, recurrent severe without psychotic features: Secondary | ICD-10-CM

## 2012-07-23 NOTE — Progress Notes (Signed)
   THERAPIST PROGRESS NOTE  Session Time: 3:00pm-3:50pm  Participation Level: Active  Behavioral Response: Well GroomedAlertDepressed and Euthymic  Type of Therapy: Individual Therapy  Treatment Goals addressed: Coping  Interventions: CBT, DBT, Motivational Interviewing, Strength-based, Supportive and Reframing  Summary: Lisa [name] is a 37 y.o. female who presents with euthymic mood and affect. She reports improvement in her degree of depression and feels that her hormones are leveling out. She is pleased that she has been offered a new position at work and looks forward to this. Her anxiety about others talking about her negatively at work, is decreasing. She has started dating someone new and discuses this. She is enjoying herself. Her sleep has improved and she nightmares have decreased.She continues to overeat in the morning, while at work. She is trying to control this. She denies any SI or SIB.    Suicidal/Homicidal: Nowithout intent/plan  Therapist Response: Assessed patients current functioning and reviewed progress. Reviewed coping strategies. Assessed patients safety and assisted in identifying protective factors.  Reviewed crisis plan with patient. Assisted patient with the expression of her feelings of frustration with her mother at times. Used CBT to assist patient with the identification of negative distortions and irrational thoughts. Encouraged patient to verbalize alternative and factual responses which challenge thought distortions. Used DBT to practice mindfulness, review distraction list and improve distress tolerance skills. Used motivational interviewing to assist and encourage patient through the change process. Explored patients barriers to change. Reviewed patients self care plan. Assessed  progress related to self care. Patient's self care is good. Recommend proper diet, regular exercise, socialization and recreation.   Plan: Return again in one  weeks.  Diagnosis: Axis I: Major Depression, Recurrent severe    Axis II: No diagnosis    Khyra Viscuso, LCSW 07/23/2012

## 2012-07-30 ENCOUNTER — Ambulatory Visit (INDEPENDENT_AMBULATORY_CARE_PROVIDER_SITE_OTHER): Payer: 59 | Admitting: Licensed Clinical Social Worker

## 2012-07-30 DIAGNOSIS — F332 Major depressive disorder, recurrent severe without psychotic features: Secondary | ICD-10-CM

## 2012-07-30 NOTE — Progress Notes (Signed)
   THERAPIST PROGRESS NOTE  Session Time: 4:00pm-4:50pm  Participation Level: Active  Behavioral Response: Well GroomedAlertEuthymic  Type of Therapy: Individual Therapy  Treatment Goals addressed: Coping  Interventions: CBT, DBT, Strength-based, Supportive and Reframing  Summary: Lisa [name] is a 37 y.o. female who presents with euthymic mood and bright affect. She reports doing well and is pleased that her mood has improved and believes that her recent depression was due to hormones. She will start her new job next week and looks forward to this. She continues dating and discusses her thoughts about having children. She is more accepting of the idea of not having children and is being realistic about her age. She is more focused on this since she is dating men who are talking about their desire or lack of desire for children. She is active and socializing. She denies any SIB. Her sleep remains disruputed and her appetite is wnl.   Suicidal/Homicidal: Nowithout intent/plan  Therapist Response: Assessed patients current functioning and reviewed progress. Reviewed coping strategies. Assessed patients safety and assisted in identifying protective factors.  Reviewed crisis plan with patient. Assisted patient with the expression of her feelings of anxiety. Used CBT to assist patient with the identification of negative distortions and irrational thoughts. Encouraged patient to verbalize alternative and factual responses which challenge thought distortions. NOrmalized patients ideas and concerns about having children. Used motivational interviewing to assist and encourage patient through the change process. Explored patients barriers to change. Reviewed patients self care plan. Assessed  progress related to self care. Patient's self care is good. Recommend proper diet, regular exercise, socialization and recreation.   Plan: Return again in one weeks.  Diagnosis: Axis I: Major Depression, Recurrent  severe    Axis II: No diagnosis    Neave Lenger, LCSW 07/30/2012

## 2012-08-06 ENCOUNTER — Ambulatory Visit (HOSPITAL_COMMUNITY): Payer: Self-pay | Admitting: Licensed Clinical Social Worker

## 2012-08-13 ENCOUNTER — Ambulatory Visit (INDEPENDENT_AMBULATORY_CARE_PROVIDER_SITE_OTHER): Payer: 59 | Admitting: Licensed Clinical Social Worker

## 2012-08-13 DIAGNOSIS — F332 Major depressive disorder, recurrent severe without psychotic features: Secondary | ICD-10-CM

## 2012-08-13 NOTE — Progress Notes (Signed)
   THERAPIST PROGRESS NOTE  Session Time: 3:00pm-3:50pm  Participation Level: Active  Behavioral Response: Well GroomedAlertEuthymic  Type of Therapy: Individual Therapy  Treatment Goals addressed: Coping  Interventions: CBT, Strength-based, Supportive and Reframing  Summary: Lisa [name] is a 37 y.o. female who presents with euthymic mood and bright affect. She reports she is doing well, but is concerned that she is waking up at 3am in panic again. She is frustrated by this and believes it is a result of sexual intimacy with a man she is dating. She is happy dating, talks about getting used to someone desiring her and making her feel good about herself. She did hear from her ex-husband and felt negatively affected by his depression and hopelessness. She reflects upon this and how accustomed she had become to accepting less in her life. She is socializing and eating well. Exercise continues to be a challenge.   Suicidal/Homicidal: Nowithout intent/plan  Therapist Response: Assessed patients current functioning and reviewed progress. Reviewed coping strategies. Assessed patients safety and assisted in identifying protective factors.  Reviewed crisis plan with patient. Assisted patient with the expression of her feelings of frustration related to her sleep and panic. Used CBT to assist patient with the identification of negative distortions and irrational thoughts. Encouraged patient to verbalize alternative and factual responses which challenge thought distortions. Used motivational interviewing to assist and encourage patient through the change process. Explored patients barriers to change. Reviewed patients self care plan. Assessed  progress related to self care. Patient's self care is good. Recommend proper diet, regular exercise, socialization and recreation.   Plan: Return again in one weeks.  Diagnosis: Axis I: Major Depression, Recurrent severe    Axis II: No  diagnosis    Cassandra Harbold, LCSW 08/13/2012

## 2012-08-20 ENCOUNTER — Ambulatory Visit (INDEPENDENT_AMBULATORY_CARE_PROVIDER_SITE_OTHER): Payer: 59 | Admitting: Licensed Clinical Social Worker

## 2012-08-20 DIAGNOSIS — F4310 Post-traumatic stress disorder, unspecified: Secondary | ICD-10-CM

## 2012-08-20 DIAGNOSIS — F332 Major depressive disorder, recurrent severe without psychotic features: Secondary | ICD-10-CM
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
## 2012-08-27 ENCOUNTER — Ambulatory Visit (INDEPENDENT_AMBULATORY_CARE_PROVIDER_SITE_OTHER): Payer: 59 | Admitting: Licensed Clinical Social Worker

## 2012-08-27 DIAGNOSIS — F332 Major depressive disorder, recurrent severe without psychotic features: Secondary | ICD-10-CM
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
## 2012-09-03 ENCOUNTER — Ambulatory Visit (INDEPENDENT_AMBULATORY_CARE_PROVIDER_SITE_OTHER): Payer: 59 | Admitting: Licensed Clinical Social Worker

## 2012-09-03 DIAGNOSIS — F332 Major depressive disorder, recurrent severe without psychotic features: Secondary | ICD-10-CM
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
## 2012-09-10 ENCOUNTER — Ambulatory Visit (INDEPENDENT_AMBULATORY_CARE_PROVIDER_SITE_OTHER): Payer: 59 | Admitting: Licensed Clinical Social Worker

## 2012-09-10 DIAGNOSIS — F332 Major depressive disorder, recurrent severe without psychotic features: Secondary | ICD-10-CM

## 2012-09-10 NOTE — Progress Notes (Signed)
   THERAPIST PROGRESS NOTE  Session Time: 4:00pm-4:50pm  Participation Level: Active  Behavioral Response: Well GroomedDrowsy and LethargicDepressed  Type of Therapy: Individual Therapy  Treatment Goals addressed: Coping  Interventions: CBT, DBT, Motivational Interviewing, Strength-based, Supportive and Reframing  Summary: Lisa [name] is a 37 y.o. female who presents with depressed mood and flat affect. She saw [name] who increased her Zoloft from 200mg  to 250mg . Patient has not started taking the increased dose yet, but will begin this weekend. She reports passive suicidal ideation, without intent or plan. She is frustrated with her job, does not feel motivated to work or exercise. She comes home from work and goes straight to bed. She knows she is avoiding but does not care. She is strongly affected by the bad weather and lack of sunshine. She is unhappy with where her life is at this point. She is tired of living in her parents basement and is now uncertain if she can move into her sisters home. She is uncomfortable with this uncertainty and does not want to live alone right now, because she knows this will increase her depression. Her sleep remains disrupted by nightmares.    Suicidal/Homicidal: Yeswithout intent/plan  Therapist Response: Assessed patients current functioning and reviewed progress. Reviewed coping strategies. Assessed patients safety and assisted in identifying protective factors.  Reviewed crisis plan with patient. Assisted patient with the expression of frustration. Reviewed patients self care plan. Assessed progress related to self care. Patients self care is fair. Recommend daily exercise, increased socialization and recreation. Used CBT to assist patient with the identification of negative distortions and irrational thoughts. Encouraged patient to verbalize alternative and factual responses which challenge thought distortions. Used DBT to practice mindfulness, review  distraction list and improve distress tolerance skills. Reviewed healthy boundaries and assertive communication. Used motivational interviewing to assist and encourage patient through the change process. Explored patients barriers to change.   Plan: Return again in one weeks.  Diagnosis: Axis I: Major Depression, Recurrent severe    Axis II: No diagnosis    Yosgar Demirjian, LCSW 09/10/2012

## 2012-09-17 ENCOUNTER — Ambulatory Visit (HOSPITAL_COMMUNITY): Payer: Self-pay | Admitting: Licensed Clinical Social Worker

## 2012-09-24 ENCOUNTER — Ambulatory Visit (INDEPENDENT_AMBULATORY_CARE_PROVIDER_SITE_OTHER): Payer: 59 | Admitting: Licensed Clinical Social Worker

## 2012-09-24 DIAGNOSIS — F332 Major depressive disorder, recurrent severe without psychotic features: Secondary | ICD-10-CM

## 2012-09-25 NOTE — Progress Notes (Signed)
   THERAPIST PROGRESS NOTE  Session Time: 4:00pm-4:50pm  Participation Level: Active  Behavioral Response: Well GroomedAlertAnxious and Depressed  Type of Therapy: Individual Therapy  Treatment Goals addressed: Coping  Interventions: CBT, DBT, Strength-based, Supportive and Reframing  Summary: Lisa [name] is a 37 y.o. female who presents with depressed mood and fatigued affect. She is not sleeping well because she has moved out of her parents home into her sisters house and she is trying to get adjusted the new place and new sounds. She is frustrated by the move, the fact that she doesn't have her furniture there and is using her brothers. She feels that her life is being dictated by her family members and their schedules. Her job is causing her stress as she is expected to continue to do two jobs. She is agitated when processing this and agitated that her two bosses will not work together to manage patients work load. She broke up with her boyfriend and expresses frustration over how he handled himself. She is proud that she was able to identify problems early on and choose not to tolerate being treated poorly. Her appetite remains high and she knows she is overeating. She has begun exercising again.  She admits to the temptation to cut, but she did not engage in any SIB.  Suicidal/Homicidal: Nowithout intent/plan  Therapist Response: Assessed patients current functioning and reviewed progress. Reviewed coping strategies. Assessed patients safety and assisted in identifying protective factors.  Reviewed crisis plan with patient. Assisted patient with the expression of frustration about her job. Reviewed patients self care plan. Assessed progress related to self care. Patients self care is good. Recommend daily exercise, increased socialization and recreation. Reviewed healthy boundaries and assertive communication. Used CBT to assist patient with the identification of negative distortions and  irrational thoughts. Encouraged patient to verbalize alternative and factual responses which challenge thought distortions. Used DBT to practice mindfulness, review distraction list and improve distress tolerance skills.   Plan: Return again in one weeks.  Diagnosis: Axis I: Major Depression, Recurrent severe    Axis II: No diagnosis    Maloni Musleh, LCSW 09/25/2012

## 2012-10-01 ENCOUNTER — Ambulatory Visit (INDEPENDENT_AMBULATORY_CARE_PROVIDER_SITE_OTHER): Payer: 59 | Admitting: Licensed Clinical Social Worker

## 2012-10-01 DIAGNOSIS — F332 Major depressive disorder, recurrent severe without psychotic features: Secondary | ICD-10-CM

## 2012-10-01 NOTE — Progress Notes (Signed)
   THERAPIST PROGRESS NOTE  Session Time: 4:00pm-4:50pm  Participation Level: Active  Behavioral Response: Well GroomedDrowsyAnxious and Depressed  Type of Therapy: Individual Therapy  Treatment Goals addressed: Coping  Interventions: CBT, DBT, Strength-based, Supportive and Reframing  Summary: Lisa [name] is a 37 y.o. female who presents with depressed mood and tired affect. She reports some depression with anxiety and panic attacks related to the uncertainty in her job, as well as, adjusting to a new home and living with her sister. She processes her frustration at her job and her concern that she cannot continue to do both jobs as she has been doing. She is trying to help her sister understand her struggle with depression and anxiety and finds this difficult because her sister has never experienced this. She is pleased that her sister is a strong support and help and therefore she wants to explain to her even if it is frustrating. Her sleep continues to be inconsistent and her appetite remains increased. She is trying to control snacking at work, but does this because it calms her feelings of panic. She is exercising.    Suicidal/Homicidal: Nowithout intent/plan  Therapist Response: Assessed patients current functioning and reviewed progress. Reviewed coping strategies. Assessed patients safety and assisted in identifying protective factors.  Reviewed crisis plan with patient. Assisted patient with the expression of frustration and anxiety. Reviewed patients self care plan. Assessed progress related to self care. Patients self care is good. Recommend daily exercise, increased socialization and recreation. Used CBT to assist patient with the identification of negative distortions and irrational thoughts. Encouraged patient to verbalize alternative and factual responses which challenge thought distortions. Used DBT to practice mindfulness, review distraction list and improve distress tolerance  skills. Used motivational interviewing to assist and encourage patient through the change process. Explored patients barriers to change.   Plan: Return again in one weeks.  Diagnosis: Axis I: Major Depression, Recurrent severe    Axis II: No diagnosis    Charina Fons, LCSW 10/01/2012

## 2012-10-08 ENCOUNTER — Ambulatory Visit (INDEPENDENT_AMBULATORY_CARE_PROVIDER_SITE_OTHER): Payer: 59 | Admitting: Licensed Clinical Social Worker

## 2012-10-08 DIAGNOSIS — F332 Major depressive disorder, recurrent severe without psychotic features: Secondary | ICD-10-CM

## 2012-10-08 DIAGNOSIS — F4310 Post-traumatic stress disorder, unspecified: Secondary | ICD-10-CM
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
## 2012-10-15 ENCOUNTER — Ambulatory Visit (INDEPENDENT_AMBULATORY_CARE_PROVIDER_SITE_OTHER): Payer: 59 | Admitting: Licensed Clinical Social Worker

## 2012-10-15 DIAGNOSIS — F332 Major depressive disorder, recurrent severe without psychotic features: Secondary | ICD-10-CM
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
## 2012-10-22 ENCOUNTER — Ambulatory Visit (HOSPITAL_COMMUNITY): Payer: Self-pay | Admitting: Licensed Clinical Social Worker

## 2012-10-23 ENCOUNTER — Ambulatory Visit (INDEPENDENT_AMBULATORY_CARE_PROVIDER_SITE_OTHER): Payer: 59 | Admitting: Licensed Clinical Social Worker

## 2012-10-23 DIAGNOSIS — F332 Major depressive disorder, recurrent severe without psychotic features: Secondary | ICD-10-CM
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
## 2012-10-29 ENCOUNTER — Ambulatory Visit (HOSPITAL_COMMUNITY): Payer: Self-pay | Admitting: Licensed Clinical Social Worker

## 2012-10-30 ENCOUNTER — Ambulatory Visit (INDEPENDENT_AMBULATORY_CARE_PROVIDER_SITE_OTHER): Payer: 59 | Admitting: Licensed Clinical Social Worker

## 2012-10-30 DIAGNOSIS — F332 Major depressive disorder, recurrent severe without psychotic features: Secondary | ICD-10-CM
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
## 2012-11-03 ENCOUNTER — Other Ambulatory Visit: Payer: Self-pay | Admitting: Obstetrics and Gynecology

## 2012-11-03 DIAGNOSIS — N6320 Unspecified lump in the left breast, unspecified quadrant: Secondary | ICD-10-CM

## 2012-11-05 ENCOUNTER — Ambulatory Visit (INDEPENDENT_AMBULATORY_CARE_PROVIDER_SITE_OTHER): Payer: 59 | Admitting: Licensed Clinical Social Worker

## 2012-11-05 DIAGNOSIS — F332 Major depressive disorder, recurrent severe without psychotic features: Secondary | ICD-10-CM

## 2012-11-06 NOTE — Progress Notes (Signed)
   THERAPIST PROGRESS NOTE  Session Time: 4:00pm-4:50pm  Participation Level: Active  Behavioral Response: Well GroomedAlertAnxious and Irritable  Type of Therapy: Individual Therapy  Treatment Goals addressed: Coping  Interventions: CBT, DBT, Motivational Interviewing, Strength-based, Supportive and Reframing  Summary: Lisa [name] is a 37 y.o. female who presents with frustrated mood and irritable affect. She expresses frustration and anger with her mother and reports that her mother has been treating her poorly for the past two weeks, with a strong effort to put her down and verbally attack and criticize most all of her actions and words. She had a big argument with her mother on Mothers Day because her mother criticized patients desire to cook breakfast for everyone. She has been able to set firm boundaries with her mother and confront her attitude towards her. She is no longer fearful to confront her mother and recognizes this progress. She is sleeping better, but still has upsetting nightmares. She is exercising daily and more committed to eating a healthy diet.  She denies any urges for SIB.   Suicidal/Homicidal: Nowithout intent/plan  Therapist Response: Assessed patients current functioning and reviewed progress. Reviewed coping strategies. Assessed patients safety and assisted in identifying protective factors.  Reviewed crisis plan with patient. Assisted patient with the expression of anger. Reviewed patients self care plan. Assessed progress related to self care. Patients self care is improving. Recommend daily exercise, increased socialization and recreation. Reviewed healthy boundaries and assertive communication. Used CBT to assist patient with the identification of negative distortions and irrational thoughts. Encouraged patient to verbalize alternative and factual responses which challenge thought distortions. Used DBT to practice mindfulness, review distraction list and improve  distress tolerance skills.   Plan: Return again in one weeks.  Diagnosis: Axis I: Major Depression, Recurrent severe    Axis II: No diagnosis    Aseret Hoffman, LCSW 11/06/2012

## 2012-11-12 ENCOUNTER — Ambulatory Visit (INDEPENDENT_AMBULATORY_CARE_PROVIDER_SITE_OTHER): Payer: 59 | Admitting: Licensed Clinical Social Worker

## 2012-11-12 DIAGNOSIS — F332 Major depressive disorder, recurrent severe without psychotic features: Secondary | ICD-10-CM

## 2012-11-12 NOTE — Progress Notes (Signed)
   THERAPIST PROGRESS NOTE  Session Time: 4:00pm-4:50pm  Participation Level: Active  Behavioral Response: Well GroomedAlertDepressed and Irritable  Type of Therapy: Individual Therapy  Treatment Goals addressed: Coping  Interventions: CBT, DBT, Motivational Interviewing, Strength-based, Supportive and Reframing  Summary: Lisa [name] is a 37 y.o. female who presents with depressed mood and frustrated affect. She expresses ongoing frustration with her job and processes her uncertainty about how to handle a situation at work. She was able to spend a weekend alone at home and slept. She was much more relaxed on Monday as a result. She has been creating some distance between her and her mother and continues to uphold these boundaries. She continues to exercise and her sister helps with her motivation. She is adjusting to her new home and beginning to feel more comfortable. She denies any desire for SIB. Her appetite remains elevated.    Suicidal/Homicidal: Nowithout intent/plan  Therapist Response: Assessed patients current functioning and reviewed progress. Reviewed coping strategies. Assessed patients safety and assisted in identifying protective factors.  Reviewed crisis plan with patient. Assisted patient with the expression of frustration. Reviewed patients self care plan. Assessed progress related to self care. Patients self care is good. Recommend daily exercise, increased socialization and recreation. Used CBT to assist patient with the identification of negative distortions and irrational thoughts. Encouraged patient to verbalize alternative and factual responses which challenge thought distortions. Used motivational interviewing to assist and encourage patient through the change process. Explored patients barriers to change. Reviewed healthy boundaries and assertive communication. Used DBT to practice mindfulness, review distraction list and improve distress tolerance skills.   Plan:  Return again in one weeks.  Diagnosis: Axis I: Major Depression, Recurrent severe    Axis II: No diagnosis    Harlon Kutner, LCSW 11/12/2012

## 2012-11-13 ENCOUNTER — Ambulatory Visit
Admission: RE | Admit: 2012-11-13 | Discharge: 2012-11-13 | Disposition: A | Discharge status: Home / Self Care | Payer: 59 | Source: Ambulatory Visit | Attending: Obstetrics and Gynecology | Admitting: Obstetrics and Gynecology

## 2012-11-13 ENCOUNTER — Other Ambulatory Visit: Payer: Self-pay | Admitting: Obstetrics and Gynecology

## 2012-11-13 DIAGNOSIS — N6320 Unspecified lump in the left breast, unspecified quadrant: Secondary | ICD-10-CM

## 2012-11-19 ENCOUNTER — Ambulatory Visit (INDEPENDENT_AMBULATORY_CARE_PROVIDER_SITE_OTHER): Payer: Self-pay | Admitting: Licensed Clinical Social Worker

## 2012-11-19 DIAGNOSIS — F332 Major depressive disorder, recurrent severe without psychotic features: Secondary | ICD-10-CM

## 2012-11-19 NOTE — Progress Notes (Signed)
   THERAPIST PROGRESS NOTE  Session Time: 4:40-5:30  Participation Level: Active  Behavioral Response: Well GroomedAlertAnxious  Type of Therapy: Individual Therapy  Treatment Goals addressed: Anxiety and Coping  Interventions: CBT, DBT, Strength-based, Supportive and Reframing  Summary: Lisa [name] is a 37 y.o. female who presents with anxious mood and affect. She has just learned that her pap smear came back abnormal and she must have a procedure involving a biopsy. She endorses intense anxiety over the procedure. She is not fearful of the outcome, but is fearful of trauma related depression and anxiety. She had a biopsy of her breast and she has a fibroid tumor which is not cancerous. She has a desire to cut herself, but has been able to refrain from doing this. She is not open to using a rubber band to snap her wrist with, because she says this does not create the same pain as cutting. Her job continues to provide stress, but she is pleased with her new boss. Her sleep is poor and her appetite remains increased due to stress.   Suicidal/Homicidal: Nowithout intent/plan  Therapist Response: Assessed patients current functioning and reviewed progress. Reviewed coping strategies. Assessed patients safety and assisted in identifying protective factors.  Reviewed crisis plan with patient. Assisted patient with the expression of fear. Reviewed patients self care plan. Assessed progress related to self care. Patients self care is good. Recommend daily exercise, increased socialization and recreation. Used CBT to assist patient with the identification of negative distortions and irrational thoughts. Encouraged patient to verbalize alternative and factual responses which challenge thought distortions. Used DBT to practice mindfulness, review distraction list and improve distress tolerance skills. Reviewed healthy boundaries and assertive communication. Marland Kitchenkjn  Plan: Return again in one  weeks.  Diagnosis: Axis I: Major Depression, Recurrent severe    Axis II: No diagnosis    Deara Bober, LCSW 11/19/2012

## 2012-11-27 ENCOUNTER — Ambulatory Visit (INDEPENDENT_AMBULATORY_CARE_PROVIDER_SITE_OTHER): Payer: 59 | Admitting: Licensed Clinical Social Worker

## 2012-11-27 DIAGNOSIS — F4310 Post-traumatic stress disorder, unspecified: Secondary | ICD-10-CM

## 2012-11-27 DIAGNOSIS — F332 Major depressive disorder, recurrent severe without psychotic features: Secondary | ICD-10-CM

## 2012-11-27 NOTE — Progress Notes (Signed)
   THERAPIST PROGRESS NOTE  Session Time: 3:00pm-3:50pm  Participation Level: Active  Behavioral Response: Well GroomedAlertAnxious  Type of Therapy: Individual Therapy  Treatment Goals addressed: Coping  Interventions: CBT, DBT, Strength-based, Supportive and Reframing  Summary: Danelia Snodgrass is a 37 y.o. female who presents with anxious mood and affect. She reports ongoing anxiety and panic. She is waking up feeling panicked and is frustrated by this because she is not having nightmares to cause these. She feels uncertain what to do to manage these, but she does use distraction techniques and watches TV. She processes the fear she felt during the recent medical procedures and her fear of possibly needing more if the tests come back poorly. She continues to overeat to manage her stress, but does have some days where she can manage this. She is hopeful about getting this behavior under control. Her sleep is poor.    Suicidal/Homicidal: Nowithout intent/plan  Therapist Response: Assessed patients current functioning and reviewed progress. Reviewed coping strategies. Assessed patients safety and assisted in identifying protective factors.  Reviewed crisis plan with patient. Assisted patient with the expression of anxiety. Reviewed patients self care plan. Assessed progress related to self care. Patients self care is good. Recommend daily exercise, increased socialization and recreation. Used CBT to assist patient with the identification of negative distortions and irrational thoughts. Encouraged patient to verbalize alternative and factual responses which challenge thought distortions. Used DBT to practice mindfulness, review distraction list and improve distress tolerance skills. Used motivational interviewing to assist and encourage patient through the change process. Explored patients barriers to change.   Plan: Return again in one weeks.  Diagnosis: Axis I: Major Depression, Recurrent  severe    Axis II: No diagnosis    Demetress Tift, LCSW 11/27/2012

## 2012-12-03 ENCOUNTER — Ambulatory Visit (INDEPENDENT_AMBULATORY_CARE_PROVIDER_SITE_OTHER): Payer: 59 | Admitting: Licensed Clinical Social Worker

## 2012-12-03 DIAGNOSIS — F4310 Post-traumatic stress disorder, unspecified: Secondary | ICD-10-CM

## 2012-12-03 DIAGNOSIS — F332 Major depressive disorder, recurrent severe without psychotic features: Secondary | ICD-10-CM
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
## 2012-12-10 ENCOUNTER — Ambulatory Visit (INDEPENDENT_AMBULATORY_CARE_PROVIDER_SITE_OTHER): Payer: 59 | Admitting: Licensed Clinical Social Worker

## 2012-12-10 DIAGNOSIS — F332 Major depressive disorder, recurrent severe without psychotic features: Secondary | ICD-10-CM

## 2012-12-10 NOTE — Progress Notes (Signed)
   THERAPIST PROGRESS NOTE  Session Time: 3:00pm-3:50pm  Participation Level: Active  Behavioral Response: Well GroomedAlertAnxious and Irritable  Type of Therapy: Individual Therapy  Treatment Goals addressed: Coping  Interventions: CBT, Strength-based, Supportive and Reframing  Summary: Lisa [name] is a 37 y.o. female who presents with frustrated mood and anxious affect. She reports that her good friend attempted suicide this past week. She is angry about this and does not understand how she could have attempted at home knowing her children could have found her. She believes that if she had children she would not attempt suicide because of how it would impact them and she can't understand how her friend can feel differently. She processes her frustration trying to reach out to her friend and her friends negative response. She continues to struggle with stress at work but is managing this well. She is not sleeping well and her binge eating remains a challenge to control. She denies any temptation for SIB.    Suicidal/Homicidal: Nowithout intent/plan  Therapist Response: Assessed patients current functioning and reviewed progress. Reviewed coping strategies. Assessed patients safety and assisted in identifying protective factors.  Reviewed crisis plan with patient. Assisted patient with the expression of frustration and anger. Reviewed patients self care plan. Assessed progress related to self care. Patients self care is good. Recommend daily exercise, increased socialization and recreation. Used CBT to assist patient with the identification of negative distortions and irrational thoughts. Encouraged patient to verbalize alternative and factual responses which challenge thought distortions. Reviewed healthy boundaries and assertive communication. Processed and normalized patients grief reaction.   Plan: Return again in one weeks.  Diagnosis: Axis I: Major Depression, Recurrent  severe    Axis II: No diagnosis    Paloma Grange, LCSW 12/10/2012

## 2012-12-17 ENCOUNTER — Ambulatory Visit (INDEPENDENT_AMBULATORY_CARE_PROVIDER_SITE_OTHER): Payer: 59 | Admitting: Licensed Clinical Social Worker

## 2012-12-17 DIAGNOSIS — F332 Major depressive disorder, recurrent severe without psychotic features: Secondary | ICD-10-CM

## 2012-12-17 NOTE — Progress Notes (Signed)
   THERAPIST PROGRESS NOTE  Session Time: 3:00pm-3:50pm  Participation Level: Active  Behavioral Response: Well GroomedAlertIrritable  Type of Therapy: Individual Therapy  Treatment Goals addressed: Coping  Interventions: CBT, Motivational Interviewing, Strength-based, Supportive and Reframing  Summary: Lisa [name] is a 37 y.o. female who presents with euthymic mood and agitated affect. She is sore because she has been exercising at a gym that her cousin singed her up for despite patients wishes not to. She is attending out of obligation and is frustrated and angry with the expectations of others. She is frustrated with her cousin who lives with her for her constant negativity and processes how it impacts her. She continues to experience anger towards her friend who attempted suicide and she is uncertain why she does not have any empathy since she has been suicidal many times. Work is going well and she feel supported by her boss. Overall, she is more confident in herself, her abilities and her body. Her sleep is disrupted and her appetite is wnl, with improved control over binge eating.   Suicidal/Homicidal: Nowithout intent/plan  Therapist Response: Assessed patients current functioning and reviewed progress. Reviewed coping strategies. Assessed patients safety and assisted in identifying protective factors.  Reviewed crisis plan with patient. Assisted patient with the expression of frustration. Reviewed patients self care plan. Assessed progress related to self care. Patients self care is improving. Recommend daily exercise, increased socialization and recreation. Used CBT to assist patient with the identification of negative distortions and irrational thoughts. Encouraged patient to verbalize alternative and factual responses which challenge thought distortions. Used DBT to practice mindfulness, review distraction list and improve distress tolerance skills. Used motivational interviewing to  assist and encourage patient through the change process. Explored patients barriers to change.   Plan: Return again in one weeks.  Diagnosis: Axis I: Major Depression, Recurrent severe    Axis II: No diagnosis    Lisa Stockinger, LCSW 12/17/2012

## 2012-12-24 ENCOUNTER — Ambulatory Visit (INDEPENDENT_AMBULATORY_CARE_PROVIDER_SITE_OTHER): Payer: 59 | Admitting: Licensed Clinical Social Worker

## 2012-12-24 DIAGNOSIS — F332 Major depressive disorder, recurrent severe without psychotic features: Secondary | ICD-10-CM

## 2012-12-24 NOTE — Progress Notes (Signed)
   THERAPIST PROGRESS NOTE  Session Time: 3:00pm-3:50pm  Participation Level: Active  Behavioral Response: Well GroomedAlertDepressed and Irritable  Type of Therapy: Individual Therapy  Treatment Goals addressed: Coping  Interventions: CBT, DBT, Motivational Interviewing, Strength-based, Supportive and Reframing  Summary: Lisa [name] is a 37 y.o. female who presents with depressed mood and agitated affect. She reports feeling hostile with increased depression. She feels indifferent and is concerned about this. Her weight is up by 13 pounds and she is not motivated to exercise as much as she knows she should. She feels judged by her family and her roommate. She has strong negative self talk which she cannot reframe. She endorses a desire for SIB, but she has been able to distract herself away from this. She denies any suicidal ideation, intent or plan and clearly states that her family is her protective factor. She expresses frustration over her continued struggle with depression. Her sleep remains disrupted and her appetite remains increased to manage her stress.    Suicidal/Homicidal: Nowithout intent/plan  Therapist Response: Assessed patients current functioning and reviewed progress. Reviewed coping strategies. Assessed patients safety and assisted in identifying protective factors.  Reviewed crisis plan with patient. Assisted patient with the expression of agitation. Reviewed patients self care plan. Assessed progress related to self care. Patients self care is fair. Recommend daily exercise, increased socialization and recreation. Used CBT to assist patient with the identification of negative distortions and irrational thoughts. Encouraged patient to verbalize alternative and factual responses which challenge thought distortions. Used motivational interviewing to assist and encourage patient through the change process. Explored patients barriers to change. Reviewed healthy boundaries and  assertive communication. Used DBT to practice mindfulness, review distraction list and improve distress tolerance skills.   Plan: Return again in one weeks.  Diagnosis: Axis I: Major Depression, Recurrent severe    Axis II: No diagnosis    Lisa Mette, LCSW 12/24/2012

## 2012-12-31 ENCOUNTER — Ambulatory Visit (INDEPENDENT_AMBULATORY_CARE_PROVIDER_SITE_OTHER): Payer: 59 | Admitting: Licensed Clinical Social Worker

## 2012-12-31 DIAGNOSIS — F332 Major depressive disorder, recurrent severe without psychotic features: Secondary | ICD-10-CM

## 2012-12-31 NOTE — Progress Notes (Signed)
   THERAPIST PROGRESS NOTE  Session Time: 3:00pm-3:50pm  Participation Level: Active  Behavioral Response: Well GroomedAlertDepressed  Type of Therapy: Individual Therapy  Treatment Goals addressed: Coping  Interventions: CBT, DBT, Motivational Interviewing, Strength-based, Supportive and Reframing  Summary: Lisa [name] is a 37 y.o. female who presents with depressed mood and labile affect. She reports an increase in her depression and is managing this by staying very busy with tasks. She is currently painting her parents house and enjoys this. She has temptations to cut, but is able to distract herself to avoid harm. She did follow through on recommendations from last session to set firmer boundaries at work and at home. She feels good about expressing herself, but is concerned about her increased hostility and agitation. She is exercising regularly and eating better. Work is going well and she is socializing. She processes her natural frustration with her depression. Strongly recommend she contact [name] to discuss possible medication adjustment.    Suicidal/Homicidal: Nowithout intent/plan  Therapist Response: Assessed patients current functioning and reviewed progress. Reviewed coping strategies. Assessed patients safety and assisted in identifying protective factors.  Reviewed crisis plan with patient. Assisted patient with the expression of frustration and sadness. Reviewed patients self care plan. Assessed progress related to self care. Patients self care is improving. Recommend daily exercise, increased socialization and recreation. Reviewed healthy boundaries and assertive communication. Used CBT to assist patient with the identification of negative distortions and irrational thoughts. Encouraged patient to verbalize alternative and factual responses which challenge thought distortions. Used DBT to practice mindfulness, review distraction list and improve distress tolerance skills.  Used motivational interviewing to assist and encourage patient through the change process. Explored patients barriers to change.   Plan: Return again in one weeks.  Diagnosis: Axis I: Major Depression, Recurrent severe    Axis II: No diagnosis    Liat Mayol, LCSW 12/31/2012

## 2013-01-07 ENCOUNTER — Ambulatory Visit (INDEPENDENT_AMBULATORY_CARE_PROVIDER_SITE_OTHER): Payer: 59 | Admitting: Licensed Clinical Social Worker

## 2013-01-07 DIAGNOSIS — F332 Major depressive disorder, recurrent severe without psychotic features: Secondary | ICD-10-CM

## 2013-01-07 NOTE — Progress Notes (Signed)
   THERAPIST PROGRESS NOTE  Session Time: 4:00pm-4:50pm  Participation Level: Active  Behavioral Response: Well GroomedLethargicDepressed  Type of Therapy: Individual Therapy  Treatment Goals addressed: Coping  Interventions: CBT, DBT, Motivational Interviewing, Strength-based, Supportive and Reframing  Summary: Lisa [name] is a 37 y.o. female who presents with depressed mood and flat affect. She has her period and is cramping and is typically more depressed during this time. She endorses and demonstrates strong feelings of apathy. She is able to go to work, but her focus is compromised. She is not sleeping well and knows she is at risk for a seizure and she is trying to take good care of herself. She endorses strong negative self talk focused on being "week". Her appetite is wnl and she is trying to exercise but is not motivated.    Suicidal/Homicidal: Nowithout intent/plan  Therapist Response: Assessed patients current functioning and reviewed progress. Reviewed coping strategies. Assessed patients safety and assisted in identifying protective factors.  Reviewed crisis plan with patient. Assisted patient with the expression of sadness and frustartion. Reviewed patients self care plan. Assessed progress related to self care. Patients self care is fair. Recommend daily exercise, increased socialization and recreation. Used CBT to assist patient with the identification of negative distortions and irrational thoughts. Encouraged patient to verbalize alternative and factual responses which challenge thought distortions. Used DBT to practice mindfulness, review distraction list and improve distress tolerance skills. Used motivational interviewing to assist and encourage patient through the change process. Explored patients barriers to change.   Plan: Return again in one weeks.  Diagnosis: Axis I: Major Depression, Recurrent severe    Axis II: No diagnosis    Traylen Eckels,  LCSW 01/07/2013

## 2013-01-14 ENCOUNTER — Ambulatory Visit (INDEPENDENT_AMBULATORY_CARE_PROVIDER_SITE_OTHER): Payer: 59 | Admitting: Licensed Clinical Social Worker

## 2013-01-14 DIAGNOSIS — F332 Major depressive disorder, recurrent severe without psychotic features: Secondary | ICD-10-CM

## 2013-01-14 NOTE — Progress Notes (Signed)
   THERAPIST PROGRESS NOTE  Session Time: 3:00pm-3:50pm  Participation Level: Active  Behavioral Response: Well GroomedDrowsyAnxious, Depressed and Irritable  Type of Therapy: Individual Therapy  Treatment Goals addressed: Coping  Interventions: CBT, DBT, Strength-based, Supportive and Reframing  Summary: Lisa [name] is a 38 y.o. female who presents with depressed mood and agitated affect. She is tired and reports spending all her free time painting her parents and her sisters homes. She is frustrated by this experience and by the expectations put on her. She processes her anger with her sister and her family for not adjusting their expectations of her. She is unwilling to slow down because she believes it won't make a difference and that her family will just wait until she can paint again. She has urges to cut or burn herself. She gave up smoking after a desire to burn herself. She has not cut and uses DBT strategies to distract herself. She is tearful when processing her sadness over depression and the course of the disease over her life.    Suicidal/Homicidal: Nowithout intent/plan  Therapist Response: Assessed patients current functioning and reviewed progress. Reviewed coping strategies. Assessed patients safety and assisted in identifying protective factors.  Reviewed crisis plan with patient. Assisted patient with the expression of frustration and sadness. Reviewed patients self care plan. Assessed progress related to self care. Patients self care is good. Recommend daily exercise, increased socialization and recreation. Used CBT to assist patient with the identification of negative distortions and irrational thoughts. Encouraged patient to verbalize alternative and factual responses which challenge thought distortions. Used DBT to practice mindfulness, review distraction list and improve distress tolerance skills. Reviewed healthy boundaries and assertive communication. Used motivational  interviewing to assist and encourage patient through the change process. Explored patients barriers to change.   Plan: Return again in one weeks.  Diagnosis: Axis I: Major Depression, Recurrent severe    Axis II: No diagnosis    Lisa Hendricksen, LCSW 01/14/2013

## 2013-01-21 ENCOUNTER — Ambulatory Visit (INDEPENDENT_AMBULATORY_CARE_PROVIDER_SITE_OTHER): Payer: 59 | Admitting: Licensed Clinical Social Worker

## 2013-01-21 DIAGNOSIS — F332 Major depressive disorder, recurrent severe without psychotic features: Secondary | ICD-10-CM

## 2013-01-21 NOTE — Progress Notes (Signed)
   THERAPIST PROGRESS NOTE  Session Time: 4:00pm-4:50pm  Participation Level: Active  Behavioral Response: Well GroomedLethargicDepressed and Worthless  Type of Therapy: Individual Therapy  Treatment Goals addressed: Coping  Interventions: CBT, DBT, Motivational Interviewing, Strength-based, Supportive and Reframing  Summary: Lisa [name] is a 37 y.o. female who presents with depressed mood and flat affect. She did not go to work today because she felt too depressed. She is frustrated by the increase in her depression. She did keep herself busy with projects today to distract herself from strong negative thoughts and the desire to injure herself. She is experiencing work related stress and does not have the emotional energy to fight this. She is indifferent and hopeless about her life. She is passively suicidal and is able to commit to safety and express her protective factors being her family. She is angry that she is eating more and has gained 15 pounds since her surgery. Her motivation is poor, but she continues to push herself. Her sleep is disrupted.    Suicidal/Homicidal: Yeswithout intent/plan  Therapist Response: Assessed patients current functioning and reviewed progress. Reviewed coping strategies. Assessed patients safety and assisted in identifying protective factors.  Reviewed crisis plan with patient. Assisted patient with the expression of sadness. Reviewed patients self care plan. Assessed progress related to self care. Patients self care is fair. Recommend daily exercise, increased socialization and recreation. Used CBT to assist patient with the identification of negative distortions and irrational thoughts. Encouraged patient to verbalize alternative and factual responses which challenge thought distortions. Used DBT to practice mindfulness, review distraction list and improve distress tolerance skills. Used motivational interviewing to assist and encourage patient through the  change process. Explored patients barriers to change. Encouraged patient to stay home from work for a few days.   Plan: Return again in one weeks.  Diagnosis: Axis I: Major Depression, Recurrent severe    Axis II: No diagnosis    Arhan Mcmanamon, LCSW 01/21/2013

## 2013-01-28 ENCOUNTER — Ambulatory Visit (INDEPENDENT_AMBULATORY_CARE_PROVIDER_SITE_OTHER): Payer: 59 | Admitting: Licensed Clinical Social Worker

## 2013-01-28 DIAGNOSIS — F332 Major depressive disorder, recurrent severe without psychotic features: Secondary | ICD-10-CM
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
## 2013-02-11 ENCOUNTER — Telehealth (HOSPITAL_COMMUNITY): Payer: Self-pay

## 2013-02-11 ENCOUNTER — Ambulatory Visit (INDEPENDENT_AMBULATORY_CARE_PROVIDER_SITE_OTHER): Payer: 59 | Admitting: Licensed Clinical Social Worker

## 2013-02-11 DIAGNOSIS — F4310 Post-traumatic stress disorder, unspecified: Secondary | ICD-10-CM

## 2013-02-11 DIAGNOSIS — F332 Major depressive disorder, recurrent severe without psychotic features: Secondary | ICD-10-CM

## 2013-02-11 NOTE — Progress Notes (Signed)
   THERAPIST PROGRESS NOTE  Session Time: 4:00pm-4:50pm  Participation Level: Active  Behavioral Response: Well GroomedAlertDepressed and Irritable  Type of Therapy: Individual Therapy  Treatment Goals addressed: Coping  Interventions: CBT, DBT, Strength-based, Supportive and Reframing  Summary: Lisa [name] is a 37 y.o. female who presents with depressed mood and irritable affect. She reports some mild improvement in her depression regarding ability to focus, motivation and accomplish tasks. She endorses increased hostility and is irritable at work and at home. She is frustrated by her irritability. She has begun to exercise early in the morning and finds this helpful. She is surprised that she has been able to sustain this effort. She processes her feelings of anger and sadness related to her sister's pregnancy and the imminent birth of her niece. She does not feel that she will "ever" be able to become pregnant because she is just not emotionally healthy enough. Her sleep is inconsistent and her appetite remains increased. She denies any SIB or temptation for this.   Suicidal/Homicidal: Nowithout intent/plan  Therapist Response: Assessed patients current functioning and reviewed progress. Reviewed coping strategies. Assessed patients safety and assisted in identifying protective factors.  Reviewed crisis plan with patient. Assisted patient with the expression of anger. Reviewed patients self care plan. Assessed progress related to self care. Patients self care is improving . Recommend daily exercise, increased socialization and recreation. Reviewed healthy boundaries and assertive communication. Used CBT to assist patient with the identification of negative distortions and irrational thoughts. Encouraged patient to verbalize alternative and factual responses which challenge thought distortions. Used DBT to practice mindfulness, review distraction list and improve distress tolerance skills. Used  motivational interviewing to assist and encourage patient through the change process. Explored patients barriers to change.   Plan: Return again in one weeks.  Diagnosis: Axis I: Major Depression, Recurrent severe and Post Traumatic Stress Disorder    Axis II: No diagnosis    Kourtlyn Charlet, LCSW 02/11/2013

## 2013-02-18 ENCOUNTER — Ambulatory Visit (INDEPENDENT_AMBULATORY_CARE_PROVIDER_SITE_OTHER): Payer: 59 | Admitting: Licensed Clinical Social Worker

## 2013-02-18 DIAGNOSIS — F332 Major depressive disorder, recurrent severe without psychotic features: Secondary | ICD-10-CM

## 2013-02-18 NOTE — Progress Notes (Signed)
   THERAPIST PROGRESS NOTE  Session Time: 3:00pm-3:50pm  Participation Level: Active  Behavioral Response: Well GroomedAlertAnxious and Irritable  Type of Therapy: Individual Therapy  Treatment Goals addressed: Coping  Interventions: CBT, DBT, Strength-based, Supportive and Reframing  Summary: Lisa [name] is a 37 y.o. female who presents with agitated mood and congruent affect. she reports improvement in her depression related to motivation, energy and thoughts of death. She is more hostile and is frustrated by this. Her agitation is making her job difficult and she processes multiple examples of conflict at work. Her sister had the baby and she is tearful when processing her grief over her infertility. She explores her grief and happiness for her sister at the same time. She is frustrated that others don't understand her struggle. Her ex-husband is coming into town to pick up his belongings this weekend and she expresses feeling comfortable and not anxious about this meeting. Her sleep is disrupted by dreams related to new medication and her appetite remains high. She is exercising daily.    Suicidal/Homicidal: Nowithout intent/plan  Therapist Response: Assessed patients current functioning and reviewed progress. Reviewed coping strategies. Assessed patients safety and assisted in identifying protective factors.  Reviewed crisis plan with patient. Assisted patient with the expression of anger. Reviewed patients self care plan. Assessed progress related to self care. Patients self care is good. Recommend daily exercise, increased socialization and recreation. Used CBT to assist patient with the identification of negative distortions and irrational thoughts. Encouraged patient to verbalize alternative and factual responses which challenge thought distortions. Used DBT to practice mindfulness, review distraction list and improve distress tolerance skills. Reviewed healthy boundaries and assertive  communication. Used motivational interviewing to assist and encourage patient through the change process. Explored patients barriers to change.   Plan: Return again in one weeks.  Diagnosis: Axis I: Major Depression, Recurrent severe    Axis II: No diagnosis    Vinita Prentiss, LCSW 02/18/2013

## 2013-02-25 ENCOUNTER — Ambulatory Visit (INDEPENDENT_AMBULATORY_CARE_PROVIDER_SITE_OTHER): Payer: 59 | Admitting: Licensed Clinical Social Worker

## 2013-02-25 DIAGNOSIS — F332 Major depressive disorder, recurrent severe without psychotic features: Secondary | ICD-10-CM

## 2013-02-25 NOTE — Progress Notes (Signed)
   THERAPIST PROGRESS NOTE  Session Time: 4:00pm-4:50pm  Participation Level: Active  Behavioral Response: Well GroomedAlertIrritable  Type of Therapy: Individual Therapy  Treatment Goals addressed: Anger and Coping  Interventions: CBT, DBT, Strength-based, Supportive and Reframing  Summary: Lisa [name] is a 37 y.o. female who presents with agitated mood and congruent affect. she reports ongoing agitation related to her medication and processes her frustration over this. She is less depressed, has a brighter affect and is more motivated, but feels that she cannot tolerate her agitation. She has snapped at friends and is fearful that her agitation will get her into trouble. She plans to discuss with [name] next week. She processes her frustration with her job and how she is trying to manage this. She denies any SIB. Her sleep is inconsistent and her appetite is wnl.    Suicidal/Homicidal: Nowithout intent/plan  Therapist Response: Assessed patients current functioning and reviewed progress. Reviewed coping strategies. Assessed patients safety and assisted in identifying protective factors.  Reviewed crisis plan with patient. Assisted patient with the expression of anger. Reviewed patients self care plan. Assessed progress related to self care. Patients self care is good. Recommend daily exercise, increased socialization and recreation. Used CBT to assist patient with the identification of negative distortions and irrational thoughts. Encouraged patient to verbalize alternative and factual responses which challenge thought distortions. Used DBT to practice mindfulness, review distraction list and improve distress tolerance skills. Used motivational interviewing to assist and encourage patient through the change process. Explored patients barriers to change.   Plan: Return again in one weeks.  Diagnosis: Axis I: Major Depression, Recurrent severe    Axis II: No  diagnosis    Karryn Kosinski, LCSW 02/25/2013

## 2013-03-04 ENCOUNTER — Ambulatory Visit (INDEPENDENT_AMBULATORY_CARE_PROVIDER_SITE_OTHER): Payer: 59 | Admitting: Licensed Clinical Social Worker

## 2013-03-04 DIAGNOSIS — F332 Major depressive disorder, recurrent severe without psychotic features: Secondary | ICD-10-CM

## 2013-03-04 NOTE — Progress Notes (Signed)
   THERAPIST PROGRESS NOTE  Session Time: 4:00pm-4:50pm  Participation Level: Active  Behavioral Response: Well GroomedAlertAnxious, Depressed and Irritable  Type of Therapy: Individual Therapy  Treatment Goals addressed: Coping  Interventions: CBT, DBT, Strength-based, Supportive and Reframing  Summary: Lisa [name] is a 37 y.o. female who presents with depressed mood and agitated affect. She reports ongoing agitation and has decided that she cannot continue on this medication due to her degree of agitation. She will see [name] this coming Friday and will discuss medication options then. She processes her grief related to new niece and explores her feelings around her desire to have a child and how difficult it is for her. She is managing in her job, but remains frustrated. She has a desire for SIB to manage her agitation, but she has been able to distract herself. Her sleep is inconsistent and her appetite remains high.   Suicidal/Homicidal: Nowithout intent/plan  Therapist Response: Assessed patients current functioning and reviewed progress. Reviewed coping strategies. Assessed patients safety and assisted in identifying protective factors.  Reviewed crisis plan with patient. Assisted patient with the expression of anger. Reviewed patients self care plan. Assessed progress related to self care. Patients self care is good. Recommend daily exercise, increased socialization and recreation. Used CBT to assist patient with the identification of negative distortions and irrational thoughts. Encouraged patient to verbalize alternative and factual responses which challenge thought distortions. Used DBT to practice mindfulness, review distraction list and improve distress tolerance skills. Reviewed healthy boundaries and assertive communication. Used motivational interviewing to assist and encourage patient through the change process. Explored patients barriers to change.   Plan: Return again in  one weeks.  Diagnosis: Axis I: Major Depression, Recurrent severe    Axis II: No diagnosis    Treylen Gibbs, LCSW 03/04/2013

## 2013-03-11 ENCOUNTER — Ambulatory Visit (INDEPENDENT_AMBULATORY_CARE_PROVIDER_SITE_OTHER): Payer: 59 | Admitting: Licensed Clinical Social Worker

## 2013-03-11 DIAGNOSIS — F332 Major depressive disorder, recurrent severe without psychotic features: Secondary | ICD-10-CM

## 2013-03-16 NOTE — Progress Notes (Signed)
   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  Lisa [name] is a 37 y.o. female who presents with depressed mood and anxious affect. she reports that her medication has been changed and she feels some improvement in her degree of agitation, but is concerned that her anxiety has increased. She feels "paincky" and does not like this feeling. She remains very frustrated with her job and her family interactions. She has started dating someone new and feels positive about this. She has the desire for SIB, but is not engaging in this behavior.    Suicidal/Homicidal: Nowithout intent/plan  Therapist Response: Assessed patients current functioning and reviewed progress. Reviewed coping strategies. Assessed patients safety and assisted in identifying protective factors.  Reviewed crisis plan with patient. Assisted patient with the expression of frustration. Reviewed patients self care plan. Assessed progress related to self care. Patients self care is fair. Recommend daily exercise, increased socialization and recreation. Used CBT to assist patient with the identification of negative distortions and irrational thoughts. Encouraged patient to verbalize alternative and factual responses which challenge thought distortions. Used DBT to practice mindfulness, review distraction list and improve distress tolerance skills. Reviewed healthy boundaries and assertive communication. Used motivational interviewing to assist and encourage patient through the change process. Explored patients barriers to change.   Plan:  Return again in one weeks.  Diagnosis: Axis I: Major Depression, Recurrent severe    Axis II: No diagnosis    [name], LCSW 03/16/2013

## 2013-03-18 ENCOUNTER — Ambulatory Visit (INDEPENDENT_AMBULATORY_CARE_PROVIDER_SITE_OTHER): Payer: 59 | Admitting: Licensed Clinical Social Worker

## 2013-03-18 DIAGNOSIS — F332 Major depressive disorder, recurrent severe without psychotic features: Secondary | ICD-10-CM

## 2013-03-18 NOTE — Progress Notes (Signed)
   THERAPIST PROGRESS NOTE  Session Time: 3:00pm-3:50pm  Participation Level: Active  Behavioral Response: Well GroomedAlertAnxious, Depressed and Irritable  Type of Therapy: Individual Therapy  Treatment Goals addressed: Coping  Interventions: CBT, DBT, Strength-based, Supportive and Reframing  Summary: Lisa [name] is a 37 y.o. female who presents with depressed mood and agitated affect. She reports continued high levels of agitation and processes her current frustration with her job, her boss and co-workers. She explores her feelings of anger over being treated poorly and being misunderstood. She is sleeping better on the new medication. Her appetite remains high but she is trying to decrease how much she eats. She continues to exercise and finds this helpful. She denies any desire for SIB and reports feeling safe with herself.    Suicidal/Homicidal: Nowithout intent/plan  Therapist Response: Assessed patients current functioning and reviewed progress. Reviewed coping strategies. Assessed patients safety and assisted in identifying protective factors.  Reviewed crisis plan with patient. Assisted patient with the expression of anxiety and agitation. Reviewed patients self care plan. Assessed progress related to self care. Patients self care is good. Recommend daily exercise, increased socialization and recreation. Used CBT to assist patient with the identification of negative distortions and irrational thoughts. Encouraged patient to verbalize alternative and factual responses which challenge thought distortions. Reviewed healthy boundaries and assertive communication. Used motivational interviewing to assist and encourage patient through the change process. Explored patients barriers to change.   Plan: Return again in one weeks.  Diagnosis: Axis I: Major Depression, Recurrent severe    Axis II: No diagnosis    Caley Volkert, LCSW 03/18/2013

## 2013-03-25 ENCOUNTER — Ambulatory Visit (INDEPENDENT_AMBULATORY_CARE_PROVIDER_SITE_OTHER): Payer: 59 | Admitting: Licensed Clinical Social Worker

## 2013-03-25 DIAGNOSIS — F4310 Post-traumatic stress disorder, unspecified: Secondary | ICD-10-CM

## 2013-03-25 DIAGNOSIS — F332 Major depressive disorder, recurrent severe without psychotic features: Secondary | ICD-10-CM

## 2013-03-25 NOTE — Progress Notes (Signed)
   THERAPIST PROGRESS NOTE  Session Time: 3:00pm-3:50pm  Participation Level: Active  Behavioral Response: Well GroomedAlertDepressed and Irritable  Type of Therapy: Individual Therapy  Treatment Goals addressed: Coping  Interventions: CBT, DBT, Strength-based, Supportive and Reframing  Summary: Lisa [name] is a 37 y.o. female who presents with depressed mood and agitated affect. She reports some improvement in her degree of agitation, but reports an increase in feelings of depression. She reports feeling flat and is upset over this. She is very agitated over her job and unrealistic expectations being made of her. She feels traumatized over past experiences at her job and does not trust people. She is being asked to trust and she demonstrates panic over this request. Her sleep and appetite are both disrupted due to stress. She denies any desire for SIB.    Suicidal/Homicidal: Nowithout intent/plan  Therapist Response: Assessed patients current functioning and reviewed progress. Reviewed coping strategies. Assessed patients safety and assisted in identifying protective factors.  Reviewed crisis plan with patient. Assisted patient with the expression of anger. Reviewed patients self care plan. Assessed progress related to self care. Patients self care is good. Recommend daily exercise, increased socialization and recreation. Used CBT to assist patient with the identification of negative distortions and irrational thoughts. Encouraged patient to verbalize alternative and factual responses which challenge thought distortions. Used DBT to practice mindfulness, review distraction list and improve distress tolerance skills. Reviewed healthy boundaries and assertive communication.   Plan: Return again in one weeks.  Diagnosis: Axis I: Major Depression, Recurrent severe and Post Traumatic Stress Disorder    Axis II: No diagnosis    Pelagia Iacobucci, LCSW 03/25/2013

## 2013-04-01 ENCOUNTER — Ambulatory Visit (INDEPENDENT_AMBULATORY_CARE_PROVIDER_SITE_OTHER): Payer: 59 | Admitting: Licensed Clinical Social Worker

## 2013-04-01 ENCOUNTER — Encounter (INDEPENDENT_AMBULATORY_CARE_PROVIDER_SITE_OTHER): Payer: Self-pay

## 2013-04-01 DIAGNOSIS — F332 Major depressive disorder, recurrent severe without psychotic features: Secondary | ICD-10-CM

## 2013-04-01 NOTE — Progress Notes (Signed)
   THERAPIST PROGRESS NOTE  Session Time: 4:00pm-4:50pm  Participation Level: Active  Behavioral Response: Well GroomedAlertDepressed and Irritable  Type of Therapy: Individual Therapy  Treatment Goals addressed: Coping  Interventions: CBT, DBT, Strength-based, Supportive and Reframing  Summary: Lisa [name] is a 37 y.o. female who presents with depressed mood and agitated affect. She reports ongoing agitation and is frustrated by this. She continues to experience intense frustration at work. She processes her feelings about being misunderstood and that her behavior is often interpreted wrongly. She has decided to move back in with her parents if her cousin does not purchase a house. She wants to save money and buy her own home. She continues to spend time with her family and socializes when she can. She denies any SIB or desire to harm herself. Her sleep has been disrupted and her appetite is wnl and she reports improved control over her food binges.    Suicidal/Homicidal: Nowithout intent/plan  Therapist Response: Assessed patients current functioning and reviewed progress. Reviewed coping strategies. Assessed patients safety and assisted in identifying protective factors.  Reviewed crisis plan with patient. Assisted patient with the expression of agitation. Reviewed patients self care plan. Assessed progress related to self care. Patients self care is good. Recommend daily exercise, increased socialization and recreation. Used CBT to assist patient with the identification of negative distortions and irrational thoughts. Encouraged patient to verbalize alternative and factual responses which challenge thought distortions. Used DBT to practice mindfulness, review distraction list and improve distress tolerance skills. Reviewed healthy boundaries and assertive communication. Used motivational interviewing to assist and encourage patient through the change process. Explored patients barriers to  change.   Plan: Return again in one weeks.  Diagnosis: Axis I: Major Depression, Recurrent severe    Axis II: No diagnosis    Arriah Wadle, LCSW 04/01/2013

## 2013-04-08 ENCOUNTER — Ambulatory Visit (INDEPENDENT_AMBULATORY_CARE_PROVIDER_SITE_OTHER): Payer: 59 | Admitting: Licensed Clinical Social Worker

## 2013-04-08 DIAGNOSIS — F4310 Post-traumatic stress disorder, unspecified: Secondary | ICD-10-CM

## 2013-04-08 DIAGNOSIS — F332 Major depressive disorder, recurrent severe without psychotic features: Secondary | ICD-10-CM

## 2013-04-08 NOTE — Progress Notes (Signed)
   THERAPIST PROGRESS NOTE  Session Time: 3:00pm-3:50pm  Participation Level: Active  Behavioral Response: Well GroomedLethargicAnxious, Depressed and Irritable  Type of Therapy: Individual Therapy  Treatment Goals addressed: Coping  Interventions: CBT, DBT, Strength-based, Supportive and Reframing  Summary: Lisa [name] is a 37 y.o. female who presents with depressed mood and irritable affect. she is not sleeping well at all. Her job is causing increased stress and she is having racing thoughts at night. She is upset with being treated poorly at her job and reports an increase in her hypervigilance as a result of this. She is trying to control her eating and is having some success. She denies any desire for SIB. She is using CBT and DBT to control her mood lability.   Suicidal/Homicidal: Nowithout intent/plan  Therapist Response: Assessed patients current functioning and reviewed progress. Reviewed coping strategies. Assessed patients safety and assisted in identifying protective factors.  Reviewed crisis plan with patient. Assisted patient with the expression of frustration. Reviewed patients self care plan. Assessed progress related to self care. Patients self care is good. Recommend daily exercise, increased socialization and recreation. Used CBT to assist patient with the identification of negative distortions and irrational thoughts. Encouraged patient to verbalize alternative and factual responses which challenge thought distortions. Used DBT to practice mindfulness, review distraction list and improve distress tolerance skills. Used motivational interviewing to assist and encourage patient through the change process. Explored patients barriers to change.       Plan: Return again in one weeks.  Diagnosis: Axis I: Major Depression, Recurrent severe    Axis II: No diagnosis    Trevyon Swor, LCSW 04/08/2013

## 2013-04-15 ENCOUNTER — Ambulatory Visit (INDEPENDENT_AMBULATORY_CARE_PROVIDER_SITE_OTHER): Payer: 59 | Admitting: Licensed Clinical Social Worker

## 2013-04-15 DIAGNOSIS — F332 Major depressive disorder, recurrent severe without psychotic features: Secondary | ICD-10-CM

## 2013-04-15 DIAGNOSIS — F4310 Post-traumatic stress disorder, unspecified: Secondary | ICD-10-CM

## 2013-04-15 NOTE — Progress Notes (Signed)
   THERAPIST PROGRESS NOTE  Session Time: 1:00pm-1:50pm  Participation Level: Active  Behavioral Response: Well GroomedAlertAnxious, Depressed and Irritable  Type of Therapy: Individual Therapy  Treatment Goals addressed: Coping  Interventions: CBT, DBT, Strength-based, Supportive and Reframing  Summary: Lisa [name] is a 37 y.o. female who presents with depressed mood and irritable affect. She just returned from a short trip to the beach and was able to relax while she was there. She processes her frustration and anger with her family for unrealistic expectations placed upon her. She is angry that they don't validate her needs and expect her to make sacrifices for them when she is depressed. Evaluated her response to her new medication since she will follow up with [name] this week. Her sleep is inconsistent. Her appetite remains high and she is trying to decrease this. She denies any SIB or suicidal ideation, intent or plan.   Suicidal/Homicidal: Nowithout intent/plan  Therapist Response: Assessed patients current functioning and reviewed progress. Reviewed coping strategies. Assessed patients safety and assisted in identifying protective factors.  Reviewed crisis plan with patient. Assisted patient with the expression of frustration about her family. Reviewed patients self care plan. Assessed progress related to self care. Patients self care is good. Recommend daily exercise, increased socialization and recreation. Used CBT to assist patient with the identification of negative distortions and irrational thoughts. Encouraged patient to verbalize alternative and factual responses which challenge thought distortions. Used DBT to practice mindfulness, review distraction list and improve distress tolerance skills. Used motivational interviewing to assist and encourage patient through the change process. Explored patients barriers to change.   Plan: Return again in one weeks.  Diagnosis: Axis  I: Major Depression, Recurrent severe and Post Traumatic Stress Disorder    Axis II: No diagnosis    Arvetta Araque, LCSW 04/15/2013

## 2013-04-22 ENCOUNTER — Ambulatory Visit (INDEPENDENT_AMBULATORY_CARE_PROVIDER_SITE_OTHER): Payer: 59 | Admitting: Licensed Clinical Social Worker

## 2013-04-22 DIAGNOSIS — F4310 Post-traumatic stress disorder, unspecified: Secondary | ICD-10-CM

## 2013-04-22 DIAGNOSIS — F332 Major depressive disorder, recurrent severe without psychotic features: Secondary | ICD-10-CM

## 2013-04-22 NOTE — Progress Notes (Signed)
   THERAPIST PROGRESS NOTE  Session Time: 3:00pm-3:50pm  Participation Level: Active  Behavioral Response: Well GroomedAlertAnxious, Depressed and Irritable  Type of Therapy: Individual Therapy  Treatment Goals addressed: Coping  Interventions: CBT, DBT, Strength-based, Supportive and Reframing  Summary: Lisa [name] is a 37 y.o. female who presents with depressed mood and agitated affect. She is irritable and frustrated with her job. She processes her anger and frustration. She is upset that she is being treated poorly at work by a friend. Her sleep is poor and her anxiety is not well controlled. She missed her appointment with [name] due to work, but will discuss an increase in medication at her next visit. She denies any SIB or suicidal ideation, intent or plan.   Suicidal/Homicidal: Nowithout intent/plan  Therapist Response: Assessed patients current functioning and reviewed progress. Reviewed coping strategies. Assessed patients safety and assisted in identifying protective factors.  Reviewed crisis plan with patient. Assisted patient with the expression of anger and frustration. Reviewed patients self care plan. Assessed progress related to self care. Patients self care is fair. Recommend daily exercise, increased socialization and recreation. Used CBT to assist patient with the identification of negative distortions and irrational thoughts. Encouraged patient to verbalize alternative and factual responses which challenge thought distortions. Used DBT to practice mindfulness, review distraction list and improve distress tolerance skills. Used motivational interviewing to assist and encourage patient through the change process. Explored patients barriers to change.   Plan: Return again in one weeks.  Diagnosis: Axis I: Major Depression, Recurrent severe and Post Traumatic Stress Disorder    Axis II: No diagnosis    Francys Bolin, LCSW 04/22/2013

## 2013-04-29 ENCOUNTER — Ambulatory Visit (INDEPENDENT_AMBULATORY_CARE_PROVIDER_SITE_OTHER): Payer: 59 | Admitting: Licensed Clinical Social Worker

## 2013-04-29 DIAGNOSIS — F332 Major depressive disorder, recurrent severe without psychotic features: Secondary | ICD-10-CM

## 2013-04-29 DIAGNOSIS — F4310 Post-traumatic stress disorder, unspecified: Secondary | ICD-10-CM

## 2013-04-29 NOTE — Progress Notes (Signed)
   THERAPIST PROGRESS NOTE  Session Time: 4:00pm-4:50pm  Participation Level: Active  Behavioral Response: Well GroomedAlertAnxious, Depressed and Irritable  Type of Therapy: Individual Therapy  Treatment Goals addressed: Coping  Interventions: CBT, DBT, Motivational Interviewing, Strength-based, Supportive and Reframing  Summary: Lisa [name] is a 37 y.o. female who presents with depressed mood and agitated affect. She reports poor sleep and continued over eating. She is exercising and is committed to eating better. She was offered the new position at work and is looking forward to making this change. She processes her ongoing frustration with some co-workers. She explores her grief and loss related to infertility and processes how she often tries to manage her families feelings of discomfort around her loss. She denies any SIB or suicidal ideation, intent or plan.    Suicidal/Homicidal: Nowithout intent/plan  Therapist Response: Assessed patients current functioning and reviewed progress. Reviewed coping strategies. Assessed patients safety and assisted in identifying protective factors.  Reviewed crisis plan with patient. Assisted patient with the expression of frustration. Reviewed patients self care plan. Assessed progress related to self care. Patients self care is good. Recommend daily exercise, increased socialization and recreation. Used CBT to assist patient with the identification of negative distortions and irrational thoughts. Encouraged patient to verbalize alternative and factual responses which challenge thought distortions. Used DBT to practice mindfulness, review distraction list and improve distress tolerance skills. Used motivational interviewing to assist and encourage patient through the change process. Explored patients barriers to change.   Plan: Return again in one weeks.  Diagnosis: Axis I: Major Depression, Recurrent severe and Post Traumatic Stress  Disorder    Axis II: No diagnosis    Carlise Stofer, LCSW 04/29/2013

## 2013-05-06 ENCOUNTER — Ambulatory Visit (INDEPENDENT_AMBULATORY_CARE_PROVIDER_SITE_OTHER): Payer: 59 | Admitting: Licensed Clinical Social Worker

## 2013-05-06 DIAGNOSIS — F332 Major depressive disorder, recurrent severe without psychotic features: Secondary | ICD-10-CM

## 2013-05-06 NOTE — Progress Notes (Signed)
   THERAPIST PROGRESS NOTE  Session Time: 4:00pm-4:50pm  Participation Level: Active  Behavioral Response: Well GroomedAlertAnxious and Depressed  Type of Therapy: Individual Therapy  Treatment Goals addressed: Coping  Interventions: CBT, DBT, Strength-based, Supportive and Reframing  Summary: Lisa [name] is a 37 y.o. female who presents with depressed mood and flat affect. She reports that she has her period and is in pain. She endorses increased anxiety related to the trigger of the sight of blood. She is more depressed due to this hormone shift. She is not sleeping well and her appetite is slightly better controlled. She continues to exercise, but feels that she lacks the degree of motivation she wants and needs. She denies any SIB or desire to harm herself. Processed her frustration with her job.    Suicidal/Homicidal: Nowithout intent/plan  Therapist Response: Assessed patients current functioning and reviewed progress. Reviewed coping strategies. Assessed patients safety and assisted in identifying protective factors.  Reviewed crisis plan with patient. Assisted patient with the expression of anxiety. Reviewed patients self care plan. Assessed progress related to self care. Patients self care is good. Recommend daily exercise, increased socialization and recreation. Used CBT to assist patient with the identification of negative distortions and irrational thoughts. Encouraged patient to verbalize alternative and factual responses which challenge thought distortions. Used DBT to practice mindfulness, review distraction list and improve distress tolerance skills. Reviewed healthy boundaries and assertive communication. Used motivational interviewing to assist and encourage patient through the change process. Explored patients barriers to change.   Plan: Return again in one weeks.  Diagnosis: Axis I: Major Depression, Recurrent severe    Axis II: No diagnosis    Lavalle Skoda,  LCSW 05/06/2013

## 2013-05-13 ENCOUNTER — Ambulatory Visit (INDEPENDENT_AMBULATORY_CARE_PROVIDER_SITE_OTHER): Payer: 59 | Admitting: Licensed Clinical Social Worker

## 2013-05-13 DIAGNOSIS — F332 Major depressive disorder, recurrent severe without psychotic features: Secondary | ICD-10-CM

## 2013-05-13 NOTE — Progress Notes (Signed)
   THERAPIST PROGRESS NOTE  Session Time: 4:00pm-4:50pm  Participation Level: Active  Behavioral Response: Well GroomedAlertAnxious and Depressed  Type of Therapy: Individual Therapy  Treatment Goals addressed: Coping  Interventions: CBT, DBT, Motivational Interviewing, Strength-based, Supportive and Reframing  Summary: Lisa [name] is a 37 y.o. female who presents with depressed mood and frustrated affect. She reports that she is enjoying her new job, but is hypervigilant about how she comes across to people. She processes her fear over being punished and misunderstood. She admits that navigating the politics of the workplace is difficult for her. Her sleep is poor and disrupted. She continues to eat due to stress. She processes her ongoing frustration with her roommate. She denies any SIB or temptation for self harm.   Suicidal/Homicidal: Nowithout intent/plan  Therapist Response: Assessed patients current functioning and reviewed progress. Reviewed coping strategies. Assessed patients safety and assisted in identifying protective factors.  Reviewed crisis plan with patient. Assisted patient with the expression of frustrtaion. Reviewed patients self care plan. Assessed progress related to self care. Patients self care is good. Recommend daily exercise, increased socialization and recreation. Used CBT to assist patient with the identification of negative distortions and irrational thoughts. Encouraged patient to verbalize alternative and factual responses which challenge thought distortions. Used DBT to practice mindfulness, review distraction list and improve distress tolerance skills. Reviewed healthy boundaries and assertive communication.   Plan: Return again in one weeks.  Diagnosis: Axis I: Major Depression, Recurrent severe    Axis II: No diagnosis    Jameela Michna, LCSW 05/13/2013

## 2013-05-20 ENCOUNTER — Ambulatory Visit (INDEPENDENT_AMBULATORY_CARE_PROVIDER_SITE_OTHER): Payer: 59 | Admitting: Licensed Clinical Social Worker

## 2013-05-20 DIAGNOSIS — F332 Major depressive disorder, recurrent severe without psychotic features: Secondary | ICD-10-CM

## 2013-05-20 NOTE — Progress Notes (Signed)
   THERAPIST PROGRESS NOTE  Session Time: 4:00pm-4:50pm  Participation Level: Active  Behavioral Response: Well GroomedAlertAnxious, Depressed and Irritable  Type of Therapy: Individual Therapy  Treatment Goals addressed: Coping  Interventions: CBT, Motivational Interviewing, Strength-based, Supportive and Reframing  Summary: Lisa [name] is a 37 y.o. female who presents with anxious mood and agitated affect. She is frustrated with her job and her new boss. She processes her frustration and reports that she is feels very confident in her knowledge in this division and is therefore more able to assert herself. Processed any holiday anxiety she has and discussed self care. She denies any desire for SIB. Her sleep is poor and her appetite is wnl.   Suicidal/Homicidal: Nowithout intent/plan  Therapist Response: Assessed patients current functioning and reviewed progress. Reviewed coping strategies. Assessed patients safety and assisted in identifying protective factors.  Reviewed crisis plan with patient. Assisted patient with the expression of frustration with her job. Reviewed patients self care plan. Assessed progress related to self care. Patients self care is good. Recommend daily exercise, increased socialization and recreation. Used CBT to assist patient with the identification of negative distortions and irrational thoughts. Encouraged patient to verbalize alternative and factual responses which challenge thought distortions. Used motivational interviewing to assist and encourage patient through the change process. Explored patients barriers to change. Used DBT to practice mindfulness, review distraction list and improve distress tolerance skills.   Plan: Return again in one weeks.  Diagnosis: Axis I: Major Depression, Recurrent severe    Axis II: No diagnosis    Zen Felling, LCSW 05/20/2013

## 2013-06-03 ENCOUNTER — Ambulatory Visit (INDEPENDENT_AMBULATORY_CARE_PROVIDER_SITE_OTHER): Payer: 59 | Admitting: Licensed Clinical Social Worker

## 2013-06-03 DIAGNOSIS — F4310 Post-traumatic stress disorder, unspecified: Secondary | ICD-10-CM

## 2013-06-03 DIAGNOSIS — F332 Major depressive disorder, recurrent severe without psychotic features: Secondary | ICD-10-CM

## 2013-06-03 NOTE — Progress Notes (Signed)
   THERAPIST PROGRESS NOTE  Session Time: 8:30am-9:20am  Participation Level: Active  Behavioral Response: Well GroomedAlertAnxious  Type of Therapy: Individual Therapy  Treatment Goals addressed: Anxiety and Coping  Interventions: CBT, DBT, Strength-based, Supportive and Reframing  Summary: Lisa [name] is a 37 y.o. female who presents with euthymic mood and anxious affect. She is tired, not sleeping well and having vivid dreams. She denies any nightmares related to her trauma, but is frustrated with poor sleep. She will see [name] tomorrow and will talk to her about poor sleep. She enjoys her new position, discusses her anxiety and how her level of confidence is positively impacting her self esteem and interactions with her peers and superiors at work. She denies any desire for self harm. She has learned that her aunt has cancer and only 6 months to live. She processes her grief. She is making progress related to her goals of reduction of PTSD symptom disruption in her daily life, improved self care and improved mood stability.    Suicidal/Homicidal: Nowithout intent/plan  Therapist Response: Assessed patients current functioning and reviewed progress. Reviewed coping strategies. Assessed patients safety and assisted in identifying protective factors.  Reviewed crisis plan with patient. Assisted patient with the expression of frustration and anxiety related to her job. Reviewed patients self care plan. Assessed progress related to self care. Patients self care is good. Recommend daily exercise, increased socialization and recreation. Reviewed healthy boundaries and assertive communication. Used CBT to assist patient with the identification of negative distortions and irrational thoughts. Encouraged patient to verbalize alternative and factual responses which challenge thought distortions.   Plan: Return again in one weeks.  Diagnosis: Axis I: Major Depression, Recurrent severe and Post  Traumatic Stress Disorder    Axis II: No diagnosis    Raiford Fetterman, LCSW 06/03/2013

## 2013-06-11 ENCOUNTER — Ambulatory Visit (INDEPENDENT_AMBULATORY_CARE_PROVIDER_SITE_OTHER): Payer: 59 | Admitting: Licensed Clinical Social Worker

## 2013-06-11 DIAGNOSIS — F332 Major depressive disorder, recurrent severe without psychotic features: Secondary | ICD-10-CM

## 2013-06-11 NOTE — Progress Notes (Signed)
   THERAPIST PROGRESS NOTE  Session Time: 8:30am-9:20am  Participation Level: Active  Behavioral Response: Well GroomedAlertAnxious and Irritable  Type of Therapy: Individual Therapy  Treatment Goals addressed: Anxiety and Coping  Interventions: CBT, DBT, Motivational Interviewing, Strength-based, Supportive and Reframing  Summary: Lisa [name] is a 36 y.o. female who presents with anxious mood and congruent affect. She reports improvement in her degree of depression and reports that her medication has been increased. She reports some anxiety and agitation. She continues to take Chantix, denies any suicidal ideation, intent or plan, but does endorse feeling nauseous and fatigued on the medication. She is committed to quitting smoking. She enjoys her job and is still trying to navigate her new boss. She denies any nightmares, flashbacks or dissociative experiences. She denies any SIB. She continues to demonstrate improvement in her well being and focus on goals.    Suicidal/Homicidal: Nowithout intent/plan  Therapist Response: Assessed patients current functioning and reviewed progress. Reviewed coping strategies. Assessed patients safety and assisted in identifying protective factors.  Reviewed crisis plan with patient. Assisted patient with the expression of frustration with her job. Reviewed patients self care plan. Assessed progress related to self care. Patients self care is good. Recommend daily exercise, increased socialization and recreation. Used CBT to assist patient with the identification of negative distortions and irrational thoughts. Encouraged patient to verbalize alternative and factual responses which challenge thought distortions. Used DBT to practice mindfulness, review distraction list and improve distress tolerance skills. Reviewed healthy boundaries and assertive communication. Used motivational interviewing to assist and encourage patient through the change process. Explored  patients barriers to change.   Plan: Return again in one weeks. Patient will remain compliant with medication, Patient will refrain from any SIB, Patient will use CBT to manage her negative thought distortions, Patient will use assertive communication to manage appropriate boundaries with her family and co-workers.   Diagnosis: Axis I: Major Depression, Recurrent severe and Post Traumatic Stress Disorder    Axis II: No diagnosis    Brystol Wasilewski, LCSW 06/11/2013

## 2013-06-16 ENCOUNTER — Ambulatory Visit (INDEPENDENT_AMBULATORY_CARE_PROVIDER_SITE_OTHER): Payer: 59 | Admitting: Licensed Clinical Social Worker

## 2013-06-16 DIAGNOSIS — F4310 Post-traumatic stress disorder, unspecified: Secondary | ICD-10-CM

## 2013-06-16 DIAGNOSIS — F332 Major depressive disorder, recurrent severe without psychotic features: Secondary | ICD-10-CM

## 2013-06-16 NOTE — Progress Notes (Signed)
   THERAPIST PROGRESS NOTE  Session Time: 9:30am-10:20am  Participation Level: Active  Behavioral Response: Well GroomedAlertAnxious, Depressed and Irritable  Type of Therapy: Individual Therapy  Treatment Goals addressed: Anxiety and Coping  Interventions: CBT, DBT, Motivational Interviewing, Strength-based, Supportive and Reframing  Summary: Lisa [name] is a 37 y.o. female who presents with depressed mood and agitated affect. She reports increased agitation and believes this is related to Chantix. She has decided to stop taking it. She has not smoked in four days and is struggling with this. She is concerned about her agitation and how she is being perceived at work. She has an evaluation with her boss today and is anxious about this. She denies any SIB and is using her DBT skills to manage her emotional lability. Her sleep and appetite are wnl.  Suicidal/Homicidal: Nowithout intent/plan  Therapist Response: Assessed patients current functioning and reviewed progress. Reviewed coping strategies. Assessed patients safety and assisted in identifying protective factors.  Reviewed crisis plan with patient. Assisted patient with the expression of agitation. Reviewed patients self care plan. Assessed progress related to self care. Patients self care is good. Recommend daily exercise, increased socialization and recreation. Reviewed healthy boundaries and assertive communication. Used CBT to assist patient with the identification of negative distortions and irrational thoughts. Encouraged patient to verbalize alternative and factual responses which challenge thought distortions. Used DBT to practice mindfulness, review distraction list and improve distress tolerance skills. Used motivational interviewing to assist and encourage patient through the change process. Explored patients barriers to change.   Plan: Return again in one weeks.Patient will remain compliant with medication, Patient will  refrain from any SIB, Patient will use CBT to manage her negative thought distortions, Patient will use assertive communication to manage appropriate boundaries with her family and co-workers.      Diagnosis: Axis I: Major Depression, Recurrent severe and Post Traumatic Stress Disorder    Axis II: No diagnosis    Atina Feeley, LCSW 06/16/2013

## 2013-06-24 ENCOUNTER — Ambulatory Visit (INDEPENDENT_AMBULATORY_CARE_PROVIDER_SITE_OTHER): Payer: 59 | Admitting: Licensed Clinical Social Worker

## 2013-06-24 DIAGNOSIS — F4310 Post-traumatic stress disorder, unspecified: Secondary | ICD-10-CM

## 2013-06-24 DIAGNOSIS — F332 Major depressive disorder, recurrent severe without psychotic features: Secondary | ICD-10-CM

## 2013-06-24 NOTE — Progress Notes (Signed)
   THERAPIST PROGRESS NOTE  Session Time: 3:00pm-3:50pm  Participation Level: Active  Behavioral Response: Well GroomedAlertAnxious and Depressed  Type of Therapy: Individual Therapy  Treatment Goals addressed: Coping  Interventions: CBT, DBT, Motivational Interviewing, Strength-based, Supportive and Reframing  Summary: Lisa [name] is a 37 y.o. female who presents with anxious mood and affect. She is off of work this week and reports improvement in her stress levels. She does not feel that her agitation levels are decreasing, but attributes this to smoking cessation. She processes her anxiety related to the man she is dating. She feels inferior and is challenged to accept herself in a different way. She processes her frustration with herself over changing to protect herself. Discussed the possibility of sabotage and how this will manifest itself. Her sleep and appetite are wnl.   Suicidal/Homicidal: Nowithout intent/plan  Therapist Response: Assessed patients current functioning and reviewed progress. Reviewed coping strategies. Assessed patients safety and assisted in identifying protective factors.  Reviewed crisis plan with patient. Assisted patient with the expression of anxiety. Reviewed patients self care plan. Assessed progress related to self care. Patients self care is good. Recommend daily exercise, increased socialization and recreation. Used CBT to assist patient with the identification of negative distortions and irrational thoughts. Encouraged patient to verbalize alternative and factual responses which challenge thought distortions. Used DBT to practice mindfulness, review distraction list and improve distress tolerance skills. Used motivational interviewing to assist and encourage patient through the change process. Explored patients barriers to change.   Plan: Return again in one weeks.  Diagnosis: Axis I: Major Depression, Recurrent severe and Post Traumatic Stress  Disorder    Axis II: No diagnosis    Quay Simkin, LCSW 06/24/2013

## 2013-07-01 ENCOUNTER — Ambulatory Visit (INDEPENDENT_AMBULATORY_CARE_PROVIDER_SITE_OTHER): Payer: 59 | Admitting: Licensed Clinical Social Worker

## 2013-07-01 DIAGNOSIS — F332 Major depressive disorder, recurrent severe without psychotic features: Secondary | ICD-10-CM

## 2013-07-01 NOTE — Progress Notes (Signed)
   THERAPIST PROGRESS NOTE  Session Time: 2:00pm-2:50pm  Participation Level: Active  Behavioral Response: Well GroomedAlertAnxious  Type of Therapy: Individual Therapy  Treatment Goals addressed: Coping  Interventions: CBT, DBT, Strength-based, Supportive and Reframing  Summary: Ronny Ruddell is a 38 y.o. female who presents with euthymic mood and anxious affect. She is calmer today and is now off Chantix and reports a dramatic improvement in her mood and agitation level. She processes her frustration with her anxiety related to dating and not feeling worthy enough. She processes her struggle with self worth.    Suicidal/Homicidal: Nowithout intent/plan  Therapist Response: Assessed patients current functioning and reviewed progress. Reviewed coping strategies. Assessed patients safety and assisted in identifying protective factors.  Reviewed crisis plan with patient. Assisted patient with the expression of anxiety. Reviewed patients self care plan. Assessed progress related to self care. Patients self care is improving. Recommend daily exercise, increased socialization and recreation. Reviewed healthy boundaries and assertive communication. Used CBT to assist patient with the identification of negative distortions and irrational thoughts. Encouraged patient to verbalize alternative and factual responses which challenge thought distortions. Used DBT to practice mindfulness, review distraction list and improve distress tolerance skills.   Plan: Return again in one weeks.  Diagnosis: Axis I: Major Depression, Recurrent severe    Axis II: No diagnosis    Sharie Amorin, LCSW 07/01/2013

## 2013-07-14 ENCOUNTER — Ambulatory Visit (INDEPENDENT_AMBULATORY_CARE_PROVIDER_SITE_OTHER): Payer: 59 | Admitting: Licensed Clinical Social Worker

## 2013-07-14 DIAGNOSIS — F332 Major depressive disorder, recurrent severe without psychotic features: Secondary | ICD-10-CM

## 2013-07-14 NOTE — Progress Notes (Signed)
   THERAPIST PROGRESS NOTE  Session Time: 4:00pm-4:50pm  Participation Level: Active  Behavioral Response: Well GroomedAlertEuthymic  Type of Therapy: Individual Therapy  Treatment Goals addressed: Coping  Interventions: CBT, Strength-based, Supportive and Reframing  Summary: Babe Anthis is a 38 y.o. female who presents with euthymic mood and bright affect. She reports ongoing mood stability. She has lost five pounds and is exercising regularly. Her job is going well and she is happy in her relationship. She has not harmed herself at all and denies any suicidal ideation, intent or plan. Overall, she reports that she is doing well. Informed and processed this writers departure. Will discuss further at her next appointments.    Suicidal/Homicidal: Nowithout intent/plan  Therapist Response: Assessed patients current functioning and reviewed progress. Reviewed coping strategies. Assessed patients safety and assisted in identifying protective factors.  Reviewed crisis plan with patient. Assisted patient with the expression of frustration. Reviewed patients self care plan. Assessed progress related to self care. Patients self care is good. Recommend daily exercise, increased socialization and recreation. Used CBT to assist patient with the identification of negative distortions and irrational thoughts. Encouraged patient to verbalize alternative and factual responses which challenge thought distortions.   Plan: Return again in one weeks.  Diagnosis: Axis I: Major Depression, Recurrent severe    Axis II: No diagnosis    Tyreesha Maharaj, LCSW 07/14/2013

## 2013-07-21 ENCOUNTER — Ambulatory Visit (INDEPENDENT_AMBULATORY_CARE_PROVIDER_SITE_OTHER): Payer: 59 | Admitting: Licensed Clinical Social Worker

## 2013-07-21 DIAGNOSIS — F4310 Post-traumatic stress disorder, unspecified: Secondary | ICD-10-CM

## 2013-07-21 DIAGNOSIS — F332 Major depressive disorder, recurrent severe without psychotic features: Secondary | ICD-10-CM

## 2013-07-22 NOTE — Progress Notes (Signed)
   THERAPIST PROGRESS NOTE  Session Time: 4:00pm-4:50pm  Participation Level: Active  Behavioral Response: Well GroomedAlertAnxious  Type of Therapy: Individual Therapy  Treatment Goals addressed: Coping  Interventions: CBT, DBT, Strength-based, Supportive and Reframing  Summary: Lisa [name] is a 38 y.o. female who presents with euthymic mood and anxious affect. She reports an increase in anxiety related to this therapist leaving the practice. She processes her fear over beginning treatment with someone new and how difficult this process can be.  She just learned that she will have to leave her sisters home and move back in with her parents for the time being. She is adapting well to this news, but will miss her privacy. She is not financially stable enough to live on her own. She continues to struggle with acceptance of self while dating. She is being challenged by being treated so well. Her sleep is poor due to anxiety and her appetite is wnl.  Suicidal/Homicidal: Nowithout intent/plan  Therapist Response: Assessed patients current functioning and reviewed progress. Reviewed coping strategies. Assessed patients safety and assisted in identifying protective factors.  Reviewed crisis plan with patient. Assisted patient with the expression of anxiety about this therapist leaving. Reviewed patients self care plan. Assessed progress related to self care. Patients self care is good. Recommend daily exercise, increased socialization and recreation. Used CBT to assist patient with the identification of negative distortions and irrational thoughts. Encouraged patient to verbalize alternative and factual responses which challenge thought distortions.   Plan: Return again in one weeks.Processed and normalized patients grief reaction.   Diagnosis: Axis I: Major Depression, Recurrent severe    Axis II: No diagnosis    Edia Pursifull, LCSW 07/22/2013

## 2013-07-28 ENCOUNTER — Ambulatory Visit (INDEPENDENT_AMBULATORY_CARE_PROVIDER_SITE_OTHER): Payer: 59 | Admitting: Licensed Clinical Social Worker

## 2013-07-28 DIAGNOSIS — F332 Major depressive disorder, recurrent severe without psychotic features: Secondary | ICD-10-CM

## 2013-07-29 NOTE — Progress Notes (Signed)
   THERAPIST PROGRESS NOTE  Session Time: 4:00pm-4:50pm  Participation Level: Active  Behavioral Response: Well GroomedAlertAnxious  Type of Therapy: Individual Therapy  Treatment Goals addressed: Coping  Interventions: CBT, Strength-based, Supportive and Reframing  Summary: Lisa [name] is a 38 y.o. female who presents with anxious mood and affect. She reports an increase in anxiety over the past week related to this writers departure. She reports that her depression is still well controlled, she is just fearful because many changes are happening which she does not have control over. She has moved back in with her parents and processes this change. Her boyfriend broke up with her and she is confused and upset over this. Processed her fear and uncertainty.    Suicidal/Homicidal: Nowithout intent/plan  Therapist Response: Assessed patients current functioning and reviewed progress. Reviewed coping strategies. Assessed patients safety and assisted in identifying protective factors.  Reviewed crisis plan with patient. Assisted patient with the expression of anxiety over this therapists departure. Reviewed patients self care plan. Assessed progress related to self care. Patients self care is good. Recommend daily exercise, increased socialization and recreation. Used CBT to assist patient with the identification of negative distortions and irrational thoughts. Encouraged patient to verbalize alternative and factual responses which challenge thought distortions. Reviewed healthy boundaries and assertive communication.   Plan: Return again in one weeks.  Diagnosis: Axis I: Major Depression, Recurrent severe    Axis II: No diagnosis    Caileen Veracruz, LCSW 07/29/2013

## 2013-08-05 ENCOUNTER — Ambulatory Visit (INDEPENDENT_AMBULATORY_CARE_PROVIDER_SITE_OTHER): Payer: 59 | Admitting: Licensed Clinical Social Worker

## 2013-08-05 DIAGNOSIS — F332 Major depressive disorder, recurrent severe without psychotic features: Secondary | ICD-10-CM

## 2013-08-05 NOTE — Progress Notes (Signed)
   THERAPIST PROGRESS NOTE  Session Time: 4:00pm-4:50pm  Participation Level: Active  Behavioral Response: Well GroomedAlertAnxious  Type of Therapy: Individual Therapy  Treatment Goals addressed: Coping  Interventions: CBT, Strength-based, Supportive and Reframing  Summary: Lisa [name] is a 38 y.o. female who presents with anxious mood and affect. She processes her anxiety about this therapist leaving. Discussed the progress patient has made throughout therapy and discussed how patient will take care of herself going forward. Patient may take a break from therapy for a little while, but plans to continue as she realizes that her depression will return at some point in time.    Suicidal/Homicidal: Nowithout intent/plan  Therapist Response: Assessed patients current functioning and reviewed progress. Reviewed coping strategies. Assessed patients safety and assisted in identifying protective factors.  Reviewed crisis plan with patient. Assisted patient with the expression of anxiety. Reviewed patients self care plan. Assessed progress related to self care. Patients self care is good. Recommend daily exercise, increased socialization and recreation. Used CBT to assist patient with the identification of negative distortions and irrational thoughts. Encouraged patient to verbalize alternative and factual responses which challenge thought distortions.   Plan: Referred to Gwenn Aull at [facility]  Diagnosis: Axis I: Major Depression, Recurrent severe    Axis II: No diagnosis    Zilphia Kozinski, LCSW 08/05/2013

## 2013-08-11 ENCOUNTER — Ambulatory Visit (HOSPITAL_COMMUNITY): Payer: Self-pay | Admitting: Licensed Clinical Social Worker

## 2013-08-18 ENCOUNTER — Ambulatory Visit (HOSPITAL_COMMUNITY): Payer: Self-pay | Admitting: Licensed Clinical Social Worker

## 2013-08-25 ENCOUNTER — Ambulatory Visit (INDEPENDENT_AMBULATORY_CARE_PROVIDER_SITE_OTHER): Payer: 59 | Admitting: Marriage and Family Therapist

## 2013-08-25 DIAGNOSIS — F331 Major depressive disorder, recurrent, moderate: Secondary | ICD-10-CM

## 2013-08-25 NOTE — Progress Notes (Signed)
   THERAPIST PROGRESS NOTE  Session Time:  9:00 - 10:00 a.m.  Participation Level: Active  Behavioral Response: CasualAlertDepressed (moderate)  Type of Therapy: Individual Therapy  Treatment Goals addressed: Coping  Interventions: Strength-based, Supportive and Other: Developed treatment plan  Summary: Lisa [name] is a 38 y.o. female who presents with depression.  She was referred by her therapist, Darlyn Chamber.  Patient talked about her therapist leaving this practice regarding missing her and also what the relationship has meant to her.  She talked about what she has been dealing with over the past four years including infertility treatment and her husband asking her for a divorce.  She is now divorced and living with her parents.  She reports it is not difficult there and they are supportive.  She reports staying there is better because they are financially doing well and she can save money.  She reports her alternative after the divorce would have been to find a place in a bad area of [place].  She reports she sees Dr. Chucky May and takes Brintellex for her depression.  She admits taking multiple antidepressants that eventually do not work.  She reports taking Valium as needed for anxiety and has never abused them.  She reports having gastric bypass surgery but continues to have difficulty with binging and recently has gained 26 lbs.  She admits her mother and sister went through bypass surgery as well and that emotional eating is a pattern in her family with the exception of her father who was in the [employer] and tended to be "very structured."  She reports trying to stop smoking and had during December but just found out her aunt has lung cancer and months to live.  She reports having a lot of support, her family, friends at work and friends from college.    Suicidal/Homicidal: No  Assessed for suicide and patient states she has never attempted suicide "because she knows she would be  successful."  She reports the last time she had suicidal thoughts was more than a year ago.  Therapist Response:   First processed with patient her therapist leaving. Discussed what patient wanted to get out of being in therapy with another therapist.  She reported she "wanted a safety net" for the times when she has bouts of depression.  Talked about patient developing a new treatment plan and she agreed.  Her goals are to "develop more positive coping mechanisms rather than eating and smoking."  Explained to patient that she would be getting homework as part of her therapy and introduced the idea of [employer] biofeedback as a coping mechanism.  Gave her a brochure relating to same.  Discussed what would be expected of her regarding telephone calls, cancelling appointments, and length of sessions.  With discussion, although patient admitted she is stable for now, she decided to come weekly to develop the relationship and consistently work on a treatment plan.  Will get release signed for Dr. Toy Care.  Plan: Return again in 1 weeks.  Diagnosis: Axis I: Major depressive d/o, moderate    Axis II: Deferred    Brie Eppard, LMFT, CTS 08/25/2013

## 2013-09-01 ENCOUNTER — Ambulatory Visit (INDEPENDENT_AMBULATORY_CARE_PROVIDER_SITE_OTHER): Payer: 59 | Admitting: Marriage and Family Therapist

## 2013-09-01 DIAGNOSIS — F331 Major depressive disorder, recurrent, moderate: Secondary | ICD-10-CM

## 2013-09-01 NOTE — Progress Notes (Signed)
   THERAPIST PROGRESS NOTE  Session Time:  3:00 - 4:00 p.m.  Participation Level: Active  Behavioral Response: CasualAlertDepressed (moderate)  Type of Therapy: Individual Therapy  Treatment Goals addressed: Coping  Interventions: Strength-based and Supportive  Summary: Lisa [name] is a 38 y.o. female who presents with depression.  She was referred by Darlyn Chamber, counselor.   Patient reports she has been fighting being depressed.  She reports it is because of how she has been treated by a supervisor at work.  She reports feeling "micromanaged" something that hinders her recovery and ability to stay stable.  She reports "needing something"that will help her learn skills to cope with a boss like this.  Patient also talked about wanting to continue to learn skills to decrease her depression.  She reports "knowing that if I don't get the depression under control I will be successful at suicide."  She reports she uses this idea as a "motivator" to keep her going.  She reports everything stems around her recovery which is first in her life.  She also talked about getting tatoos so she does not cut herself which seems to trigger pain in a way that alleviates her desire to cut to cope with strong feelings.  Regarding her depression, patient reported she "lives in a way that depression is right next to me, ready to take over."  Suicidal/Homicidal: No  Therapist Response:  Patient did review the [employer] biofeedback pamphlet and states this is something she would like to learn to cope with her depression.  Taught patient the first biofeedback technique, "heart-focused breathing."  Patient will do the technique for five minutes in the morning, when she comes across a stressful event, throughout the day when she can remember, and at night before she goes to bed.  She reports getting to sleep but cannot stay asleep.  Educated her about the science behind biofeedback.  Reviewed patient's treatment plan.   Patient agreed to it, signed it.  She did not want a copy.  Also had patient sign a release for Dr. Toy Care.  She will continue the breathing and will set her up on the [place] and educate regarding same.   Plan: Return again in 1 weeks.  Diagnosis: Axis I: Major depressive d/o, moderate    Axis II: Deferred    Farhaan Mabee, LMFT, CTS 09/01/2013

## 2013-09-08 ENCOUNTER — Ambulatory Visit (HOSPITAL_COMMUNITY): Payer: Self-pay | Admitting: Marriage and Family Therapist

## 2013-09-15 ENCOUNTER — Ambulatory Visit (INDEPENDENT_AMBULATORY_CARE_PROVIDER_SITE_OTHER): Payer: 59 | Admitting: Marriage and Family Therapist

## 2013-09-15 DIAGNOSIS — F331 Major depressive disorder, recurrent, moderate: Secondary | ICD-10-CM

## 2013-09-15 NOTE — Progress Notes (Signed)
   THERAPIST PROGRESS NOTE  Session Time:  3:00 - 4:00 p.m.  Participation Level: Active  Behavioral Response: CasualAlertDepressed  Type of Therapy: Individual Therapy  Treatment Goals addressed: Coping  Interventions: Strength-based, Biofeedback and Supportive  Summary: Lisa [name] is a 38 y.o. female who presents with depression.  She was referred by Darlyn Chamber, counselor.  Patient reports she is having an increase in depression.  She reported a lot of stressors, that she will be having a lot of family company for about a week which gives her little to no down time, although she admits too much down time leads to more depression.  She reports her mother's sister is having radiation and not doing well causing her mother to be depressed.  She states it is hard for her when someone is depressed, particularly family, which makes her more depressed. Patient states her mother does not believe in therapy or medication which makes it worse for patient. She also states when she gets depressed her self-esteem plummets.    Suicidal/Homicidal: Yeswithout intent/plan - assessed for suicide.  Patient admits she is having suicidal thoughts but has never acted on them.  She reports she promises if she is feeling like "I"m going over the edge" that she will contact this Probation officer.  She also states she has a strong sense of family protection when it comes to the affect her suicide would have on them, a deterrent.  We talked about this deterrent at length.  Therapist Response:  Assessed level of depression. In a 0-10 scale, patient rated it a "7."  She was able to identify the main trigger which is work.  She and her supervisor are not getting along but are trying to work it out with little success.  In discussion she did admit she and her boss have been honest with each other about the frustration of not being able to figure out how to get along.  Focused on that idea with patient that it is unusual for a  boss and coworker to be able to get this far in resolving conflict (in order to help patient not see the situation so negatively).  Also talked about what would be good for patient to do in light of a high score on her depression.  Discussed patient taking small amounts of down time with family there, doing things that are soothing with or without others, like watching her favorite show with someone with pop corn, staying in her favorite robe.  Patient promised she would use various self-soothing techniques to help alleviate her depression.  Plan: Return again in 1 weeks.  Diagnosis: Axis I: Major depressive d/o, moderate    Axis II: Deferred    Rad Gramling, LMFT, CTS 09/15/2013

## 2013-09-22 ENCOUNTER — Ambulatory Visit (INDEPENDENT_AMBULATORY_CARE_PROVIDER_SITE_OTHER): Payer: 59 | Admitting: Marriage and Family Therapist

## 2013-09-22 DIAGNOSIS — F4310 Post-traumatic stress disorder, unspecified: Secondary | ICD-10-CM

## 2013-09-22 DIAGNOSIS — F331 Major depressive disorder, recurrent, moderate: Secondary | ICD-10-CM
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
## 2013-09-29 ENCOUNTER — Ambulatory Visit (INDEPENDENT_AMBULATORY_CARE_PROVIDER_SITE_OTHER): Payer: 59 | Admitting: Marriage and Family Therapist

## 2013-09-29 DIAGNOSIS — F331 Major depressive disorder, recurrent, moderate: Secondary | ICD-10-CM
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
## 2013-10-01 ENCOUNTER — Telehealth (HOSPITAL_COMMUNITY): Payer: Self-pay

## 2013-10-08 ENCOUNTER — Ambulatory Visit (INDEPENDENT_AMBULATORY_CARE_PROVIDER_SITE_OTHER): Payer: 59 | Admitting: Marriage and Family Therapist

## 2013-10-08 DIAGNOSIS — F331 Major depressive disorder, recurrent, moderate: Secondary | ICD-10-CM

## 2013-10-08 NOTE — Progress Notes (Signed)
   THERAPIST PROGRESS NOTE  Session Time: 9:00 - 10:00 a.m.  Participation Level: Active  Behavioral Response: CasualAlertDepressed (moderate)  Type of Therapy: Individual Therapy  Treatment Goals addressed: Coping  Interventions: Strength-based and Supportive  Summary: Lisa [name] is a 39 y.o. female who presents with depression.  She was referred by Darlyn Chamber, counselor.  Patient reports she is feeling better because she is getting ready to go to the beach for four days with her aunt and cousin.  She talked about what she likes to do there having to do with helping to alleviate her depression.  She talked about her parents in that her mother will be gone for a month taking care of patient's aunt who has cancer and patient states it will be good for her because her mother is having difficulty "holding it together."  She talked a little about what she and her ex-husband used to do around being active.  She reports hiking in certain areas that she now has difficulty going to.  She talked about how she loves to horse-back ride but has not done it in about a year.  Suicidal/Homicidal: Negative  Therapist Response:  Assessed patient's depression and through self-report and body language patient appears to be much less depressed, due to doing something that gets her out of her depression.  Decided to make this session lighter since patient has said in the past it is difficult for her to do therapy and go back to work.  Processed what patient likes to do that helps alleviate the depression, which is why the conversation came up around riding horses.  Encouraged patient to pursue this idea again.  Plan: Return again in 1 weeks.  Diagnosis: Axis I: major depressive d/o, moderate    Axis II: Deferred    Leondra Cullin, LMFT, CTS 10/08/2013

## 2013-10-13 ENCOUNTER — Ambulatory Visit (INDEPENDENT_AMBULATORY_CARE_PROVIDER_SITE_OTHER): Payer: 59 | Admitting: Marriage and Family Therapist

## 2013-10-13 DIAGNOSIS — F331 Major depressive disorder, recurrent, moderate: Secondary | ICD-10-CM

## 2013-10-13 NOTE — Progress Notes (Signed)
   THERAPIST PROGRESS NOTE  Session Time:  3:00 - 4:00 p.m.  Participation Level: Active  Behavioral Response: CasualAlertDepressed (mild)  Type of Therapy: Individual Therapy  Treatment Goals addressed: Coping  Interventions: Strength-based and Supportive  Summary: Lisa [name] is a 38 y.o. female Caucasian who presents with depression. She was referred by Darlyn Chamber, Counselor.  Patient reports she is doing better relating to her depression.  She just got back from the beach and other than her aunt having some medical problems to deal with patient got to do what she wanted to do.  Patient gave the history of her marriage and talked about being bisexual in relationship to "making better decisions with females than men."  Also discussed how her family has been more tolerable of patient's sexual preferences.  Suicidal/Homicidal: Negative  Therapist Response:   Basically took this session to assess where patient's depression has been coming from.  Was able to identify that patient's loneliness is a factor in her depression.  Encouraged patient to consider dating again since her divorce ended about two years ago and she has not dated in some time.  Will continue to develop a relationship in order to gain trust after being with her first therapist for eight years.  Plan: Return again in 1 weeks.  Diagnosis: Axis I: Major depressive d/o, moderate    Axis II: Deferred    Jamareon Shimel, LMFT, CTS 10/13/2013

## 2013-10-20 ENCOUNTER — Ambulatory Visit (INDEPENDENT_AMBULATORY_CARE_PROVIDER_SITE_OTHER): Payer: 59 | Admitting: Marriage and Family Therapist

## 2013-10-20 DIAGNOSIS — F331 Major depressive disorder, recurrent, moderate: Secondary | ICD-10-CM

## 2013-10-20 NOTE — Progress Notes (Signed)
   THERAPIST PROGRESS NOTE  Session Time:  3:00 - 4:00 p.m.  Participation Level: Active  Behavioral Response: CasualAlertDepressed  Type of Therapy: Individual Therapy  Treatment Goals addressed: Coping  Interventions: Strength-based and Supportive  Summary: Lisa [name] is a 38 y.o. female Caucasian who presents with depression.   She was referred by Darlyn Chamber, counselor.  Patient reports she has been depressed.  She reports on the weekend she was feeling more depressed but tried to do things around the house like cleaning and cooking.  She reports her father noticed.  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  Patient did not know why.  She did reports she has always had difficulty handling money but right now she has $450 in student loans along with other bills but has been saving $200 a month.  She reports liking living [place] even though if she had to she could move to a small apartment, but she does not want to move.  She did say she joined [email or web address] to start dating again.  Patient talked about how she needs down time but there continues to be a lot of activity right now.  She believes this is part of why she is having a difficulty time right now.  She also states things at work with her manager continue to cause her stress.  Suicidal/Homicidal: Negative  Therapist Response:  Assessed patient's depression which appears increased although not severe.  Patient tends to use humor around things that bother her and did ask her how she was really feeling about her family giving her advice.  Talked about ways patient could develop down time.  Also talked about this Probation officer going on vacation and discussed what to do in case of emergency e.g., call 911, go to her emergency room, come to our hospital.  Plan: Return again in 3  weeks.  Diagnosis: Axis I: Major depressive d/o, moderate    Axis II: Deferred    Lisa Neidhardt, LMFT, CTS 10/20/2013

## 2013-10-22 ENCOUNTER — Ambulatory Visit (HOSPITAL_COMMUNITY): Payer: Self-pay | Admitting: Marriage and Family Therapist

## 2013-11-10 ENCOUNTER — Ambulatory Visit (HOSPITAL_COMMUNITY): Payer: Self-pay | Admitting: Marriage and Family Therapist

## 2013-11-12 ENCOUNTER — Ambulatory Visit (HOSPITAL_COMMUNITY): Payer: Self-pay | Admitting: Marriage and Family Therapist

## 2013-11-24 ENCOUNTER — Telehealth (HOSPITAL_COMMUNITY): Payer: Self-pay | Admitting: Marriage and Family Therapist

## 2013-11-24 NOTE — Telephone Encounter (Signed)
Left patient message to call to discuss referrals in next appointment.  Lorene Dy, LMFT, CTS Counselor

## 2013-11-26 ENCOUNTER — Ambulatory Visit (INDEPENDENT_AMBULATORY_CARE_PROVIDER_SITE_OTHER): Payer: 59 | Admitting: Marriage and Family Therapist

## 2013-11-26 DIAGNOSIS — F331 Major depressive disorder, recurrent, moderate: Secondary | ICD-10-CM

## 2013-11-26 NOTE — Progress Notes (Signed)
   THERAPIST PROGRESS NOTE  Session Time:  2:00 - 3:00 p.m.  Participation Level: Active  Behavioral Response: CasualAlertDepressed (moderate)  Type of Therapy: Individual Therapy  Treatment Goals addressed: Coping  Interventions: Strength-based and Supportive  Summary: Lisa [name] is a 38 y.o. female Caucasian who presents with depression.  She was referred by Darlyn Chamber, counselor.  Patient reports she has not been in because she has been in [place].  She reports her aunt died from cancer and she and her family had to clear out her aunt's home.  She reports the situation at home has been "very stressful" specifically because her mother has been struggling and grieving more than anyone.  She reports she has been trying to support her but that there are times when it is affecting her stress level.  Patient also reports she has been talking to a few males on [email or web address] and one she funds interesting but that they have not met.  Suicidal/Homicidal: No  Therapist Response:   Discussed the impact of her aunt's death and her mother's reaction on patient and how she has been coping.  She has been spending a lot of time with others.  Discussed patient getting into the dating field and how important it is for her to finally start dating.  Discussed this Probation officer retiring.  Discussed termination and process same.  Discussed referrals.  Recommended [facility] in [place].  Patient states she will call them and call this writer if they need any additional information.  Will come in once more to complete termination.  Plan: Return again in 1 weeks.  Diagnosis: Axis I: Major depressive d/o, moderate    Axis II: Deferred    Shamarcus Hoheisel, LMFT, CTS 11/26/2013

## 2013-12-03 ENCOUNTER — Ambulatory Visit (INDEPENDENT_AMBULATORY_CARE_PROVIDER_SITE_OTHER): Payer: 59 | Admitting: Marriage and Family Therapist

## 2013-12-03 DIAGNOSIS — F330 Major depressive disorder, recurrent, mild: Secondary | ICD-10-CM

## 2013-12-03 NOTE — Progress Notes (Signed)
   THERAPIST PROGRESS NOTE  Session Time:  2:00 - 3:00 p.m.  Participation Level: Active  Behavioral Response: CasualAlertDepressed  (mild)  Type of Therapy: Individual Therapy  Treatment Goals addressed: Coping  Interventions: Strength-based and Supportive/termination  Summary: Lisa [name] is a 38 y.o. female Caucasian who presents with depression.  She was referred by Darlyn Chamber, counselor   Patient states she is doing "okay."  She reports she is not in crisis, and has decided she will hold off on seeing a therapist now.  She reports she will contact [facility] when she needs it.  She also talked about starting to date and discussed men she has been meeting.  Suicidal/Homicidal: NA  Therapist Response:   Assessed patient's depression.  Based on self-report and body language patient appears mildly depressed.  Discussed patient "knowing what she needs" and that patient will contact the counseling center in about six months unless she needs to go sooner.  She did not want any additional referrals although she was offered a list of referrals.  Processed termination.2  Plan: Return again in 0 weeks.  Diagnosis: Axis I: 296.31.  Major depressive d/o, mild    Axis II: Deferred    Saloma Cadena, LMFT, CTS 12/03/2013

## 2013-12-08 ENCOUNTER — Ambulatory Visit (HOSPITAL_COMMUNITY): Payer: Self-pay | Admitting: Marriage and Family Therapist

## 2013-12-15 ENCOUNTER — Ambulatory Visit (HOSPITAL_COMMUNITY): Payer: Self-pay | Admitting: Marriage and Family Therapist

## 2013-12-22 ENCOUNTER — Ambulatory Visit (HOSPITAL_COMMUNITY): Payer: Self-pay | Admitting: Marriage and Family Therapist

## 2014-04-26 ENCOUNTER — Encounter (HOSPITAL_COMMUNITY): Payer: Self-pay | Admitting: Licensed Clinical Social Worker

## 2014-09-07 ENCOUNTER — Ambulatory Visit (INDEPENDENT_AMBULATORY_CARE_PROVIDER_SITE_OTHER): Payer: 59 | Admitting: Physician Assistant

## 2014-09-07 ENCOUNTER — Encounter: Payer: Self-pay | Admitting: Physician Assistant

## 2014-09-07 VITALS — BP 110/70 | HR 68 | Temp 97.9°F | Resp 16 | Ht 67.5 in | Wt 215.0 lb

## 2014-09-07 DIAGNOSIS — Z0001 Encounter for general adult medical examination with abnormal findings: Secondary | ICD-10-CM

## 2014-09-07 DIAGNOSIS — E119 Type 2 diabetes mellitus without complications: Secondary | ICD-10-CM

## 2014-09-07 DIAGNOSIS — E039 Hypothyroidism, unspecified: Secondary | ICD-10-CM

## 2014-09-07 DIAGNOSIS — F32A Depression, unspecified: Secondary | ICD-10-CM

## 2014-09-07 DIAGNOSIS — R6889 Other general symptoms and signs: Secondary | ICD-10-CM

## 2014-09-07 DIAGNOSIS — E785 Hyperlipidemia, unspecified: Secondary | ICD-10-CM

## 2014-09-07 DIAGNOSIS — E669 Obesity, unspecified: Secondary | ICD-10-CM

## 2014-09-07 DIAGNOSIS — G40909 Epilepsy, unspecified, not intractable, without status epilepticus: Secondary | ICD-10-CM

## 2014-09-07 DIAGNOSIS — Z79899 Other long term (current) drug therapy: Secondary | ICD-10-CM

## 2014-09-07 DIAGNOSIS — E559 Vitamin D deficiency, unspecified: Secondary | ICD-10-CM

## 2014-09-07 DIAGNOSIS — Z6823 Body mass index (BMI) 23.0-23.9, adult: Secondary | ICD-10-CM | POA: Insufficient documentation

## 2014-09-07 DIAGNOSIS — R5383 Other fatigue: Secondary | ICD-10-CM

## 2014-09-07 DIAGNOSIS — F329 Major depressive disorder, single episode, unspecified: Secondary | ICD-10-CM

## 2014-09-07 MED ORDER — PHENTERMINE HCL 37.5 MG PO TABS: 37.5000 mg | ORAL_TABLET | Freq: Every day | ORAL | Status: DC

## 2014-09-07 NOTE — Patient Instructions (Addendum)
Phentermine  While taking the medication we may ask that you come into the office once a month or once every 2-3 months to monitor your weight, blood pressure, and heart rate. In addition we can help answer your questions about diet, exercise, and help you every step of the way with your weight loss journey. Sometime it is helpful if you bring in a food diary or use an app on your phone such as myfitnesspal to record your calorie intake, especially in the beginning.   You can start out on 1/3 to 1/2 a pill in the morning and if you are tolerating it well you can increase to one pill daily. I also have some patients that take 1/3 or 1/2 at lunch to help prevent night time eating.  This medication is cheapest CASH pay at [facility] and you do NOT need a membership to get meds from there.     What is this medicine? PHENTERMINE (FEN ter meen) decreases your appetite. This medicine is intended to be used in addition to a healthy reduced calorie diet and exercise. The best results are achieved this way. This medicine is only indicated for short-term use. Eventually your weight loss may level out and the medication will no longer be needed.   How should I use this medicine? Take this medicine by mouth. Follow the directions on the prescription label. The tablets should stay in the bottle until immediately before you take your dose. Take your doses at regular intervals. Do not take your medicine more often than directed.  Overdosage: If you think you have taken too much of this medicine contact a poison control center or emergency room at once. NOTE: This medicine is only for you. Do not share this medicine with others.  What if I miss a dose? If you miss a dose, take it as soon as you can. If it is almost time for your next dose, take only that dose. Do not take double or extra doses. Do not increase or in any way change your dose without consulting your doctor.  What should I watch for while using  this medicine? Notify your physician immediately if you become short of breath while doing your normal activities. Do not take this medicine within 6 hours of bedtime. It can keep you from getting to sleep. Avoid drinks that contain caffeine and try to stick to a regular bedtime every night. Do not stand or sit up quickly, especially if you are an older patient. This reduces the risk of dizzy or fainting spells. Avoid alcoholic drinks.  What side effects may I notice from receiving this medicine? Side effects that you should report to your doctor or health care professional as soon as possible: -chest pain, palpitations -depression or severe changes in mood -increased blood pressure -irritability -nervousness or restlessness -severe dizziness -shortness of breath -problems urinating -unusual swelling of the legs -vomiting  Side effects that usually do not require medical attention (report to your doctor or health care professional if they continue or are bothersome): -blurred vision or other eye problems -changes in sexual ability or desire -constipation or diarrhea -difficulty sleeping -dry mouth or unpleasant taste -headache -nausea This list may not describe all possible side effects. Call your doctor for medical advice about side effects. You may report side effects to FDA at 1-800-FDA-1088.   Before you even begin to attack a weight-loss plan, it pays to remember this: You are not fat. You have fat. Losing weight isn't  about blame or shame; it's simply another achievement to accomplish. Dieting is like any other skill-you have to buckle down and work at it. As long as you act in a smart, reasonable way, you'll ultimately get where you want to be. Here are some weight loss pearls for you.  1. It's Not a Diet. It's a Lifestyle Thinking of a diet as something you're on and suffering through only for the short term doesn't work. To shed weight and keep it off, you need to make  permanent changes to the way you eat. It's OK to indulge occasionally, of course, but if you cut calories temporarily and then revert to your old way of eating, you'll gain back the weight quicker than you can say yo-yo. Use it to lose it. Research shows that one of the best predictors of long-term weight loss is how many pounds you drop in the first month. For that reason, nutritionists often suggest being stricter for the first two weeks of your new eating strategy to build momentum. Cut out added sugar and alcohol and avoid unrefined carbs. After that, figure out how you can reincorporate them in a way that's healthy and maintainable.  2. There's a Right Way to Exercise Working out burns calories and fat and boosts your metabolism by building muscle. But those trying to lose weight are notorious for overestimating the number of calories they burn and underestimating the amount they take in. Unfortunately, your system is biologically programmed to hold on to extra pounds and that means when you start exercising, your body senses the deficit and ramps up its hunger signals. If you're not diligent, you'll eat everything you burn and then some. Use it to lose it. Cardio gets all the exercise glory, but strength and interval training are the real heroes. They help you build lean muscle, which in turn increases your metabolism and calorie-burning ability 3. Don't Overreact to Mild Hunger Some people have a hard time losing weight because of hunger anxiety. To them, being hungry is bad-something to be avoided at all costs-so they carry snacks with them and eat when they don't need to. Others eat because they're stressed out or bored. While you never want to get to the point of being ravenous (that's when bingeing is likely to happen), a hunger pang, a craving, or the fact that it's 3:00 p.m. should not send you racing for the vending machine or obsessing about the energy bar in your purse. Ideally, you should put  off eating until your stomach is growling and it's difficult to concentrate.  Use it to lose it. When you feel the urge to eat, use the HALT method. Ask yourself, Am I really hungry? Or am I angry or anxious, lonely or bored, or tired? If you're still not certain, try the apple test. If you're truly hungry, an apple should seem delicious; if it doesn't, something else is going on. Or you can try drinking water and making yourself busy, if you are still hungry try a healthy snack.  4. Not All Calories Are Created Equal The mechanics of weight loss are pretty simple: Take in fewer calories than you use for energy. But the kind of food you eat makes all the difference. Processed food that's high in saturated fat and refined starch or sugar can cause inflammation that disrupts the hormone signals that tell your brain you're full. The result: You eat a lot more.  Use it to lose it. Clean up your diet. Swap in  whole, unprocessed foods, including vegetables, lean protein, and healthy fats that will fill you up and give you the biggest nutritional bang for your calorie buck. In a few weeks, as your brain starts receiving regular hunger and fullness signals once again, you'll notice that you feel less hungry overall and naturally start cutting back on the amount you eat.  5. Protein, Produce, and Plant-Based Fats Are Your Weight-Loss Trinity Here's why eating the three Ps regularly will help you drop pounds. Protein fills you up. You need it to build lean muscle, which keeps your metabolism humming so that you can torch more fat. People in a weight-loss program who ate double the recommended daily allowance for protein (about 110 grams for a 150-pound woman) lost 70 percent of their weight from fat, while people who ate the RDA lost only about 40 percent, one study found. Produce is packed with filling fiber. "It's very difficult to consume too many calories if you're eating a lot of vegetables. Example: Three cups  of broccoli is a lot of food, yet only 93 calories. (Fruit is another story. It can be easy to overeat and can contain a lot of calories from sugar, so be sure to monitor your intake.) Plant-based fats like olive oil and those in avocados and nuts are healthy and extra satiating.  Use it to lose it. Aim to incorporate each of the three Ps into every meal and snack. People who eat protein throughout the day are able to keep weight off, according to a study in the [place] of Clinical Nutrition. In addition to meat, poultry and seafood, good sources are beans, lentils, eggs, tofu, and yogurt. As for fat, keep portion sizes in check by measuring out salad dressing, oil, and nut butters (shoot for one to two tablespoons). Finally, eat veggies or a little fruit at every meal. People who did that consumed 308 fewer calories but didn't feel any hungrier than when they didn't eat more produce.  7. How You Eat Is As Important As What You Eat In order for your brain to register that you're full, you need to focus on what you're eating. Sit down whenever you eat, preferably at a table. Turn off the TV or computer, put down your phone, and look at your food. Smell it. Chew slowly, and don't put another bite on your fork until you swallow. When women ate lunch this attentively, they consumed 30 percent less when snacking later than those who listened to an audiobook at lunchtime, according to a study in the [place] of Nutrition. 8. Weighing Yourself Really Works The scale provides the best evidence about whether your efforts are paying off. Seeing the numbers tick up or down or stagnate is motivation to keep going-or to rethink your approach. A 2015 study at [facility] found that daily weigh-ins helped people lose more weight, keep it off, and maintain that loss, even after two years. Use it to lose it. Step on the scale at the same time every day for the best results. If your weight shoots up  several pounds from one weigh-in to the next, don't freak out. Eating a lot of salt the night before or having your period is the likely culprit. The number should return to normal in a day or two. It's a steady climb that you need to do something about. 9. Too Much Stress and Too Little Sleep Are Your Enemies When you're tired and frazzled, your body cranks up the production of  cortisol, the stress hormone that can cause carb cravings. Not getting enough sleep also boosts your levels of ghrelin, a hormone associated with hunger, while suppressing leptin, a hormone that signals fullness and satiety. People on a diet who slept only five and a half hours a night for two weeks lost 55 percent less fat and were hungrier than those who slept eight and a half hours, according to a study in the [place]. Use it to lose it. Prioritize sleep, aiming for seven hours or more a night, which research shows helps lower stress. And make sure you're getting quality zzz's. If a snoring spouse or a fidgety cat wakes you up frequently throughout the night, you may end up getting the equivalent of just four hours of sleep, according to a study from [facility]. Keep pets out of the bedroom, and use a white-noise app to drown out snoring. 10. You Will Hit a plateau-And You Can Bust Through It As you slim down, your body releases much less leptin, the fullness hormone.  If you're not strength training, start right now. Building muscle can raise your metabolism to help you overcome a plateau. To keep your body challenged and burning calories, incorporate new moves and more intense intervals into your workouts or add another sweat session to your weekly routine. Alternatively, cut an extra 100 calories or so a day from your diet. Now that you've lost weight, your body simply doesn't need as much fuel.   Ways to cut 100 calories  1. Eat your eggs with hot sauce OR salsa instead of cheese.  Eggs  are great for breakfast, but many people consider eggs and cheese to be BFFs. Instead of cheese-1 oz. of cheddar has 114 calories-top your eggs with hot sauce, which contains no calories and helps with satiety and metabolism. Salsa is also a great option!!  2. Top your toast, waffles or pancakes with mashed berries instead of jelly or syrup. Half a cup of berries-fresh, frozen or thawed-has about 40 calories, compared with 2 tbsp. of maple syrup or jelly, which both have about 100 calories. The berries will also give you a good punch of fiber, which helps keep you full and satisfied and won't spike blood sugar quickly like the jelly or syrup. 3. Swap the non-fat latte for black coffee with a splash of half-and-half. Contrary to its name, that non-fat latte has 130 calories and a startling 19g of carbohydrates per 16 oz. serving. Replacing that 'light' drinkable dessert with a black coffee with a splash of half-and-half saves you more than 100 calories per 16 oz. serving. 4. Sprinkle salads with freeze-dried raspberries instead of dried cranberries. If you want a sweet addition to your nutritious salad, stay away from dried cranberries. They have a whopping 130 calories per  cup and 30g carbohydrates. Instead, sprinkle freeze-dried raspberries guilt-free and save more than 100 calories per  cup serving, adding 3g of belly-filling fiber. 5. Go for mustard in place of mayo on your sandwich. Mustard can add really nice flavor to any sandwich, and there are tons of varieties, from spicy to honey. A serving of mayo is 95 calories, versus 10 calories in a serving of mustard. 6. Choose a DIY salad dressing instead of the store-bought kind. Mix Dijon or whole grain mustard with low-fat Kefir or red wine vinegar and garlic. 7. Use hummus as a spread instead of a dip. Use hummus as a spread on a high-fiber cracker or tortilla with  a sandwich and save on calories without sacrificing taste. 8. Pick just one salad  "accessory." Salad isn't automatically a calorie winner. It's easy to over-accessorize with toppings. Instead of topping your salad with nuts, avocado and cranberries (all three will clock in at 313 calories), just pick one. The next day, choose a different accessory, which will also keep your salad interesting. You don't wear all your jewelry every day, right? 9. Ditch the white pasta in favor of spaghetti squash. One cup of cooked spaghetti squash has about 40 calories, compared with traditional spaghetti, which comes with more than 200. Spaghetti squash is also nutrient-dense. It's a good source of fiber and Vitamins A and C, and it can be eaten just like you would eat pasta-with a great tomato sauce and [place] meatballs or with pesto, tofu and spinach, for example. 10. Dress up your chili, soups and stews with non-fat [place] yogurt instead of sour cream. Just a 'dollop' of sour cream can set you back 115 calories and a whopping 12g of fat-seven of which are of the artery-clogging variety. Added bonus: [place] yogurt is packed with muscle-building protein, calcium and B Vitamins. 11. Mash cauliflower instead of mashed potatoes. One cup of traditional mashed potatoes-in all their creamy goodness-has more than 200 calories, compared to mashed cauliflower, which you can typically eat for less than 100 calories per 1 cup serving. Cauliflower is a great source of the antioxidant indole-3-carbinol (I3C), which may help reduce the risk of some cancers, like breast cancer. 12. Ditch the ice cream sundae in favor of a [place] yogurt parfait. Instead of a cup of ice cream or fro-yo for dessert, try 1 cup of nonfat Greek yogurt topped with fresh berries and a sprinkle of cacao nibs. Both toppings are packed with antioxidants, which can help reduce cellular inflammation and oxidative damage. And the comparison is a no-brainer: One cup of ice cream has about 275 calories; one cup of frozen yogurt has about 230; and a cup  of Greek yogurt has just 130, plus twice the protein, so you're less likely to return to the freezer for a second helping. 13. Put olive oil in a spray container instead of using it directly from the bottle. Each tablespoon of olive oil is 120 calories and 15g of fat. Use a mister instead of pouring it straight into the pan or onto a salad. This allows for portion control and will save you more than 100 calories. 14. When baking, substitute canned pumpkin for butter or oil. Canned pumpkin-not pumpkin pie mix-is loaded with Vitamin A, which is important for skin and eye health, as well as immunity. And the comparisons are pretty crazy:  cup of canned pumpkin has about 40 calories, compared to butter or oil, which has more than 800 calories. Yes, 800 calories. Applesauce and mashed banana can also serve as good substitutions for butter or oil, usually in a 1:1 ratio. 15. Top casseroles with high-fiber cereal instead of breadcrumbs. Breadcrumbs are typically made with white bread, while breakfast cereals contain 5-9g of fiber per serving. Not only will you save more than 150 calories per  cup serving, the swap will also keep you more full and you'll get a metabolism boost from the added fiber. 16. Snack on pistachios instead of macadamia nuts. Believe it or not, you get the same amount of calories from 35 pistachios (100 calories) as you would from only five macadamia nuts. 17. Chow down on kale chips rather than potato chips. This is  my favorite 'don't knock it 'till you try it' swap. Kale chips are so easy to make at home, and you can spice them up with a little grated parmesan or chili powder. Plus, they're a mere fraction of the calories of potato chips, but with the same crunch factor we crave so often. 18. Add seltzer and some fruit slices to your cocktail instead of soda or fruit juice. One cup of soda or fruit juice can pack on as much as 140 calories. Instead, use seltzer and fruit slices. The  fruit provides valuable phytochemicals, such as flavonoids and anthocyanins, which help to combat cancer and stave off the aging process.     Bad carbs also include fruit juice, alcohol, and sweet tea. These are empty calories that do not signal to your brain that you are full.   Please remember the good carbs are still carbs which convert into sugar. So please measure them out no more than 1/2-1 cup of rice, oatmeal, pasta, and beans  Veggies are however free foods! Pile them on.   Not all fruit is created equal. Please see the list below, the fruit at the bottom is higher in sugars than the fruit at the top. Please avoid all dried fruits.     We want weight loss that will last so you should lose 1-2 pounds a week.  THAT IS IT! Please pick THREE things a month to change. Once it is a habit check off the item. Then pick another three items off the list to become habits.  If you are already doing a habit on the list GREAT!  Cross that item off! o Don't drink your calories. Ie, alcohol, soda, fruit juice, and sweet tea.  o Drink more water. Drink a glass when you feel hungry or before each meal.  o Eat breakfast - Complex carb and protein (likeDannon light and fit yogurt, oatmeal, fruit, eggs, [place] bacon). o Measure your cereal.  Eat no more than one cup a day. (ie [place]) o Eat an apple a day. o Add a vegetable a day. o Try a new vegetable a month. o Use Pam! Stop using oil or butter to cook. o Don't finish your plate or use smaller plates. o Share your dessert. o Eat sugar free Jello for dessert or frozen grapes. o Don't eat 2-3 hours before bed. o Switch to whole wheat bread, pasta, and brown rice. o Make healthier choices when you eat out. No fries! o Pick baked chicken, NOT fried. o Don't forget to SLOW DOWN when you eat. It is not going anywhere.  o Take the stairs. o Park far away in the parking lot o [employer] (or weights) for 10 minutes while watching TV. o Walk at work  for 10 minutes during break. o Walk outside 1 time a week with your friend, kids, dog, or significant other. o Start a walking group at [place] the mall as much as you can tolerate.  o Keep a food diary. o Weigh yourself daily. o Walk for 15 minutes 3 days per week. o Cook at home more often and eat out less.  If life happens and you go back to old habits, it is okay.  Just start over. You can do it!   If you experience chest pain, get short of breath, or tired during the exercise, please stop immediately and inform your doctor.   I think it is possible that you have sleep apnea. It can cause  interrupted sleep, headaches, frequent awakenings, fatigue, dry mouth, fast/slow heart beats, memory issues, anxiety/depression, swelling, numbness tingling hands/feet, weight gain, shortness of breath, and the list goes on. Sleep apnea needs to be ruled out because if it is left untreated it does eventually lead to abnormal heart beats, lung failure or heart failure as well as increasing the risk of heart attack and stroke. There are masks you can wear OR a mouth piece that I can give you information about. Often times though people feel MUCH better after getting treatment.   Sleep Apnea  Sleep apnea is a sleep disorder characterized by abnormal pauses in breathing while you sleep. When your breathing pauses, the level of oxygen in your blood decreases. This causes you to move out of deep sleep and into light sleep. As a result, your quality of sleep is poor, and the system that carries your blood throughout your body (cardiovascular system) experiences stress. If sleep apnea remains untreated, the following conditions can develop:  High blood pressure (hypertension).  Coronary artery disease.  Inability to achieve or maintain an erection (impotence).  Impairment of your thought process (cognitive dysfunction). There are three types of sleep apnea: 1. Obstructive sleep apnea--Pauses in breathing  during sleep because of a blocked airway. 2. Central sleep apnea--Pauses in breathing during sleep because the area of the brain that controls your breathing does not send the correct signals to the muscles that control breathing. 3. Mixed sleep apnea--A combination of both obstructive and central sleep apnea.  RISK FACTORS The following risk factors can increase your risk of developing sleep apnea:  Being overweight.  Smoking.  Having narrow passages in your nose and throat.  Being of older age.  Being female.  Alcohol use.  Sedative and tranquilizer use.  Ethnicity. Among individuals younger than 35 years, African Americans are at increased risk of sleep apnea. SYMPTOMS   Difficulty staying asleep.  Daytime sleepiness and fatigue.  Loss of energy.  Irritability.  Loud, heavy snoring.  Morning headaches.  Trouble concentrating.  Forgetfulness.  Decreased interest in sex. DIAGNOSIS  In order to diagnose sleep apnea, your caregiver will perform a physical examination. Your caregiver may suggest that you take a home sleep test. Your caregiver may also recommend that you spend the night in a sleep lab. In the sleep lab, several monitors record information about your heart, lungs, and brain while you sleep. Your leg and arm movements and blood oxygen level are also recorded. TREATMENT The following actions may help to resolve mild sleep apnea:  Sleeping on your side.   Using a decongestant if you have nasal congestion.   Avoiding the use of depressants, including alcohol, sedatives, and narcotics.   Losing weight and modifying your diet if you are overweight. There also are devices and treatments to help open your airway:  Oral appliances. These are custom-made mouthpieces that shift your lower jaw forward and slightly open your bite. This opens your airway.  Devices that create positive airway pressure. This positive pressure "splints" your airway open to help  you breathe better during sleep. The following devices create positive airway pressure:  Continuous positive airway pressure (CPAP) device. The CPAP device creates a continuous level of air pressure with an air pump. The air is delivered to your airway through a mask while you sleep. This continuous pressure keeps your airway open.  Nasal expiratory positive airway pressure (EPAP) device. The EPAP device creates positive air pressure as you exhale. The device consists of single-use  valves, which are inserted into each nostril and held in place by adhesive. The valves create very little resistance when you inhale but create much more resistance when you exhale. That increased resistance creates the positive airway pressure. This positive pressure while you exhale keeps your airway open, making it easier to breath when you inhale again.  Bilevel positive airway pressure (BPAP) device. The BPAP device is used mainly in patients with central sleep apnea. This device is similar to the CPAP device because it also uses an air pump to deliver continuous air pressure through a mask. However, with the BPAP machine, the pressure is set at two different levels. The pressure when you exhale is lower than the pressure when you inhale.  Surgery. Typically, surgery is only done if you cannot comply with less invasive treatments or if the less invasive treatments do not improve your condition. Surgery involves removing excess tissue in your airway to create a wider passage way. Document Released: 06/01/2002 Document Revised: 10/06/2012 Document Reviewed: 10/18/2011 [facility] Patient Information 2015 [place], [place]. This information is not intended to replace advice given to you by your health care provider. Make sure you discuss any questions you have with your health care provider.

## 2014-09-07 NOTE — Progress Notes (Signed)
Complete Physical  Assessment and Plan: 1. Diabetes mellitus type II, controlled Has history of DM prior to weight loss, with weight gain will recheck.  ? Benefit from metformin, pending labs will send in  2. Hypothyroidism, unspecified hypothyroidism type Check labs, will refill and recheck  1 month after  3. Seizure disorder Continue valproic acid  4. Depression Depression- continue medications, stress management techniques discussed, increase water, good sleep hygiene discussed, increase exercise, and increase veggies.   5. Obesity Obesity with co morbidities- long discussion about weight loss, diet, and exercise, will start the patient on phentermine- hand out given and AE's discussed, will do close follow up.   6. Other fatigue Fatigue- Discussed lifestyle modification as means of resolving problem, Training modifications discussed, See orders for lab evaluation, Discussed how depression can be a cause of fatigue if all labs are negative will discuss depression treatment. VERSUS sleep apnea, has crowded mouth, mom with OSA, and has prior to sx- may get apnea link  7. Vitamin D deficiency Check vitamin D  8. Hyperlipidemia Patient states she has had elevated in the past and has family history, check lipids, decrease fatty foods, increase activity.   9. Medication management  10.Patient is s/p gastric bypass-  Check labs to do to screen for vitamin deficiencies associated with gastric bypass including Vitamin D, B12, iron. Recommend strict diet and exercise.    Discussed med's effects and SE's. Screening labs and tests as requested with regular follow-up as recommended. Labs done at [facility] are free.   HPI  39 y.o. female  presents for a complete physical as new patient.   Her blood pressure has been controlled at home, today their BP is BP: 110/70 mmHg She does not workout due to the cold but with the nicer weather she is starting to walk 3-5 days week. She denies chest  pain, shortness of breath, dizziness.  She is 4 years s/p gastric bypass, she was 260 before the surgery and got down to 167 but then she went through a divorce and slowly gained the weight back. She would like to get back to 180. Her sister is getting married in 2 months and she is struggling with weight. She has done phentermine in the past and tolerated well.  She is on levothyroxine 162mcg but she has not had a refill in 2 months.  She is on trazodone for sleep, takes PRN, uses once a month.  She is on the valium PRN as well for anxiety.  She is on the [place] ring and is on it continuously.  Will occ wake up with a headache.  She has had 3 separate miscarriages.  She has had epilepsy since she was 66, on valproic acid 250mg , has not had a seizure in a decade. At one time when she was trying to get pregnant she started to have seizures again and put back on it. She is no longer trying to get pregnant. Follows with Dr. Tommy Medal.  BMI is Body mass index is 33.16 kg/(m^2)., she is working on diet and exercise. Wt Readings from Last 3 Encounters:  09/07/14 215 lb (97.523 kg)  12/03/11 172 lb (78.019 kg)  10/11/11 171 lb (77.565 kg)      Medication List       This list is accurate as of: 09/07/14  4:31 PM.  Always use your most recent med list.               diazepam 5 MG tablet  Commonly known as:  VALIUM  1/2 to 1 daily as needed for severe anxiety     etonogestrel-ethinyl estradiol 0.12-0.015 MG/24HR vaginal ring  Commonly known as:  NUVARING  - Insert one per vagina every 21 days for a total of 4 consecutive readings. Take a three-day break and then start with one ring every 21 days for a total of 4 readings again  - Please note this requires further rings every 3 months     levothyroxine 175 MCG tablet  Commonly known as:  SYNTHROID, LEVOTHROID     omeprazole 20 MG capsule  Commonly known as:  PRILOSEC     traZODone 50 MG tablet  Commonly known as:  DESYREL  Take 50 mg  by mouth at bedtime.     Valproic Acid 250 MG Cpdr  Take 250 mg by mouth 3 (three) times daily.       Current Medications:  Health Maintenance:   Tetanus: July 2015 Pneumovax: Flu vaccine: Zostavax: LMP: on nuva ring constantly, last year with OB/GYN Pap: April 2015, due again MGM: has had MGM 2014, + fibrodense  DEXA: Colonoscopy: EGD:  Patient Care Team: Unk Pinto, MD as PCP - General (Internal Medicine)  Allergies:  Allergies  Allergen Reactions  . Codeine Itching, Rash and Other (See Comments)    OPIOIDS - MORPHINE & RELATED   Medical History:  Past Medical History  Diagnosis Date  . Depression   . Headache(784.0)   . Seizures   . Anxiety   . Asthma   . History of diabetes mellitus     prior to gastric bypass in 2012  . Abdominal cramping 08/10/2011  . Obesity     BMi 33  . Hypothyroidism   . UTI (lower urinary tract infection) 08/14/2011  . Habitual abortion history, antepartum    Surgical History:  Past Surgical History  Procedure Laterality Date  . Gastric bypass  01/01/91  . Dilation and curettage of uterus     Family History:  Family History  Problem Relation Age of Onset  . Depression Mother   . Diabetes Mother   . Anorexia nervosa Sister   . Alcohol abuse Maternal Uncle   . Anxiety disorder Maternal Uncle   . Depression Maternal Uncle   . Alzheimer's disease Maternal Grandfather   . Depression Maternal Grandmother   . Alzheimer's disease Maternal Grandmother   . Diabetes Maternal Grandmother   . Alzheimer's disease Paternal Grandmother    Social History:  History  Substance Use Topics  . Smoking status: Never Smoker   . Smokeless tobacco: Never Used  . Alcohol Use: No    Review of Systems: Review of Systems  Constitutional: Positive for malaise/fatigue. Negative for fever, chills, weight loss and diaphoresis.  HENT: Negative.   Respiratory: Negative.   Cardiovascular: Negative.   Gastrointestinal: Positive for heartburn and  diarrhea. Negative for nausea, vomiting, abdominal pain, constipation, blood in stool and melena.  Genitourinary: Negative.   Musculoskeletal: Negative.   Skin: Negative.   Neurological: Negative.  Negative for weakness.  Psychiatric/Behavioral: Positive for depression. Negative for suicidal ideas, hallucinations, memory loss and substance abuse. The patient is nervous/anxious and has insomnia.     Physical Exam: Estimated body mass index is 33.16 kg/(m^2) as calculated from the following:   Height as of this encounter: 5' 7.5" (1.715 m).   Weight as of this encounter: 215 lb (97.523 kg). BP 110/70 mmHg  Pulse 68  Temp(Src) 97.9 F (36.6 C)  Resp 16  Ht  5' 7.5" (1.715 m)  Wt 215 lb (97.523 kg)  BMI 33.16 kg/m2 General Appearance: Well nourished, in no apparent distress.  Eyes: PERRLA, EOMs, conjunctiva no swelling or erythema, normal fundi and vessels.  Sinuses: No Frontal/maxillary tenderness  ENT/Mouth: Ext aud canals clear, normal light reflex with TMs without erythema, bulging. Good dentition. No erythema, swelling, or exudate on post pharynx. Tonsils not swollen or erythematous. Hearing normal. Crowded mouth.  Neck: Supple, thyroid normal. No bruits  Respiratory: Respiratory effort normal, BS equal bilaterally without rales, rhonchi, wheezing or stridor.  Cardio: RRR without murmurs, rubs or gallops. Prominent S2. Brisk peripheral pulses without edema.  Chest: symmetric, with normal excursions and percussion.  Breasts: defer Abdomen: Soft, nontender, no guarding, rebound, hernias, masses, or organomegaly. .  Lymphatics: Non tender without lymphadenopathy.  Genitourinary: defer Musculoskeletal: Full ROM all peripheral extremities,5/5 strength, and normal gait.  Skin: Warm, dry without rashes, lesions, ecchymosis. Neuro: Cranial nerves intact, reflexes equal bilaterally. Normal muscle tone, no cerebellar symptoms. Sensation intact.  Psych: Awake and oriented X 3, normal affect,  Insight and Judgment appropriate.    EKG- declines at this time.   Vicie Mutters 4:23 PM [facility]

## 2014-10-07 ENCOUNTER — Other Ambulatory Visit: Payer: Self-pay | Admitting: Physician Assistant

## 2014-10-08 LAB — CBC WITH DIFFERENTIAL/PLATELET
BASOS ABS: 0 10*3/uL (ref 0.0–0.2)
Basos: 0 %
EOS ABS: 0.4 10*3/uL (ref 0.0–0.4)
Eos: 6 %
HCT: 36.2 % (ref 34.0–46.6)
HEMOGLOBIN: 11.5 g/dL (ref 11.1–15.9)
IMMATURE GRANS (ABS): 0 10*3/uL (ref 0.0–0.1)
IMMATURE GRANULOCYTES: 0 %
LYMPHS: 33 %
Lymphocytes Absolute: 2.3 10*3/uL (ref 0.7–3.1)
MCH: 27.2 pg (ref 26.6–33.0)
MCHC: 31.8 g/dL (ref 31.5–35.7)
MCV: 86 fL (ref 79–97)
MONOCYTES: 8 %
Monocytes Absolute: 0.6 10*3/uL (ref 0.1–0.9)
NEUTROS ABS: 3.7 10*3/uL (ref 1.4–7.0)
NEUTROS PCT: 53 %
Platelets: 351 10*3/uL (ref 150–379)
RBC: 4.23 x10E6/uL (ref 3.77–5.28)
RDW: 16.3 % — AB (ref 12.3–15.4)
WBC: 7.1 10*3/uL (ref 3.4–10.8)

## 2014-10-08 LAB — BASIC METABOLIC PANEL
BUN / CREAT RATIO: 8 (ref 8–20)
BUN: 7 mg/dL (ref 6–20)
CALCIUM: 8.9 mg/dL (ref 8.7–10.2)
CHLORIDE: 105 mmol/L (ref 97–108)
CO2: 20 mmol/L (ref 18–29)
CREATININE: 0.84 mg/dL (ref 0.57–1.00)
GFR calc Af Amer: 102 mL/min/{1.73_m2} (ref >59)
GFR calc non Af Amer: 88 mL/min/{1.73_m2} (ref >59)
Glucose: 161 mg/dL — ABNORMAL HIGH (ref 65–99)
Potassium: 4.6 mmol/L (ref 3.5–5.2)
SODIUM: 142 mmol/L (ref 134–144)

## 2014-10-08 LAB — HEPATIC FUNCTION PANEL
ALT: 37 IU/L — ABNORMAL HIGH (ref 0–32)
AST: 29 IU/L (ref 0–40)
Albumin: 4.1 g/dL (ref 3.5–5.5)
Alkaline Phosphatase: 36 IU/L — ABNORMAL LOW (ref 39–117)
Bilirubin Total: 0.5 mg/dL (ref 0.0–1.2)
Bilirubin, Direct: 0.12 mg/dL (ref 0.00–0.40)
Total Protein: 6.6 g/dL (ref 6.0–8.5)

## 2014-10-08 LAB — HGB A1C W/O EAG: Hgb A1c MFr Bld: 5.9 % — ABNORMAL HIGH (ref 4.8–5.6)

## 2014-10-08 LAB — FERRITIN: FERRITIN: 8 ng/mL — AB (ref 15–150)

## 2014-10-08 LAB — IRON: IRON: 104 ug/dL (ref 27–159)

## 2014-10-08 LAB — CHOLESTEROL, TOTAL: Cholesterol, Total: 187 mg/dL (ref 100–199)

## 2014-10-08 LAB — TRIGLYCERIDES: TRIGLYCERIDES: 212 mg/dL — AB (ref 0–149)

## 2014-10-08 LAB — VITAMIN D 25 HYDROXY (VIT D DEFICIENCY, FRACTURES): VIT D 25 HYDROXY: 21.1 ng/mL — AB (ref 30.0–100.0)

## 2014-10-08 LAB — TSH: TSH: 18.79 u[IU]/mL — AB (ref 0.450–4.500)

## 2014-10-08 LAB — INSULIN, RANDOM: INSULIN: 24.8 u[IU]/mL (ref 2.6–24.9)

## 2014-10-08 LAB — VITAMIN B12: VITAMIN B 12: 1648 pg/mL — AB (ref 211–946)

## 2014-10-08 LAB — MAGNESIUM: Magnesium: 2.2 mg/dL (ref 1.6–2.3)

## 2014-10-17 ENCOUNTER — Other Ambulatory Visit: Payer: Self-pay | Admitting: Physician Assistant

## 2014-10-17 MED ORDER — LEVOTHYROXINE SODIUM 200 MCG PO TABS: ORAL_TABLET | ORAL | Status: DC

## 2014-10-17 MED ORDER — METFORMIN HCL ER 500 MG PO TB24: ORAL_TABLET | ORAL | Status: DC

## 2014-11-12 ENCOUNTER — Encounter: Payer: Self-pay | Admitting: Internal Medicine

## 2014-11-12 ENCOUNTER — Ambulatory Visit (INDEPENDENT_AMBULATORY_CARE_PROVIDER_SITE_OTHER): Payer: 59 | Admitting: Internal Medicine

## 2014-11-12 ENCOUNTER — Ambulatory Visit: Payer: Self-pay | Admitting: Internal Medicine

## 2014-11-12 VITALS — BP 130/88 | HR 94 | Temp 98.4°F | Resp 18 | Ht 67.5 in | Wt 213.0 lb

## 2014-11-12 DIAGNOSIS — J069 Acute upper respiratory infection, unspecified: Secondary | ICD-10-CM

## 2014-11-12 MED ORDER — PREDNISONE 20 MG PO TABS: ORAL_TABLET | ORAL | Status: DC

## 2014-11-12 MED ORDER — BENZONATATE 100 MG PO CAPS: 100.0000 mg | ORAL_CAPSULE | Freq: Four times a day (QID) | ORAL | Status: DC | PRN

## 2014-11-12 MED ORDER — PHENYLEPH-PROMETHAZINE-COD 5-6.25-10 MG/5ML PO SYRP: 5.0000 mL | ORAL_SOLUTION | Freq: Every day | ORAL | Status: DC

## 2014-11-12 MED ORDER — AZITHROMYCIN 250 MG PO TABS: ORAL_TABLET | ORAL | Status: DC

## 2014-11-12 NOTE — Patient Instructions (Signed)
Upper Respiratory Infection, Adult An upper respiratory infection (URI) is also sometimes known as the common cold. The upper respiratory tract includes the nose, sinuses, throat, trachea, and bronchi. Bronchi are the airways leading to the lungs. Most people improve within 1 week, but symptoms can last up to 2 weeks. A residual cough may last even longer.  CAUSES Many different viruses can infect the tissues lining the upper respiratory tract. The tissues become irritated and inflamed and often become very moist. Mucus production is also common. A cold is contagious. You can easily spread the virus to others by oral contact. This includes kissing, sharing a glass, coughing, or sneezing. Touching your mouth or nose and then touching a surface, which is then touched by another person, can also spread the virus. SYMPTOMS  Symptoms typically develop 1 to 3 days after you come in contact with a cold virus. Symptoms vary from person to person. They may include:  Runny nose.  Sneezing.  Nasal congestion.  Sinus irritation.  Sore throat.  Loss of voice (laryngitis).  Cough.  Fatigue.  Muscle aches.  Loss of appetite.  Headache.  Low-grade fever. DIAGNOSIS  You might diagnose your own cold based on familiar symptoms, since most people get a cold 2 to 3 times a year. Your caregiver can confirm this based on your exam. Most importantly, your caregiver can check that your symptoms are not due to another disease such as strep throat, sinusitis, pneumonia, asthma, or epiglottitis. Blood tests, throat tests, and X-rays are not necessary to diagnose a common cold, but they may sometimes be helpful in excluding other more serious diseases. Your caregiver will decide if any further tests are required. RISKS AND COMPLICATIONS  You may be at risk for a more severe case of the common cold if you smoke cigarettes, have chronic heart disease (such as heart failure) or lung disease (such as asthma), or if  you have a weakened immune system. The very young and very old are also at risk for more serious infections. Bacterial sinusitis, middle ear infections, and bacterial pneumonia can complicate the common cold. The common cold can worsen asthma and chronic obstructive pulmonary disease (COPD). Sometimes, these complications can require emergency medical care and may be life-threatening. PREVENTION  The best way to protect against getting a cold is to practice good hygiene. Avoid oral or hand contact with people with cold symptoms. Wash your hands often if contact occurs. There is no clear evidence that vitamin C, vitamin E, echinacea, or exercise reduces the chance of developing a cold. However, it is always recommended to get plenty of rest and practice good nutrition. TREATMENT  Treatment is directed at relieving symptoms. There is no cure. Antibiotics are not effective, because the infection is caused by a virus, not by bacteria. Treatment may include:  Increased fluid intake. Sports drinks offer valuable electrolytes, sugars, and fluids.  Breathing heated mist or steam (vaporizer or shower).  Eating chicken soup or other clear broths, and maintaining good nutrition.  Getting plenty of rest.  Using gargles or lozenges for comfort.  Controlling fevers with ibuprofen or acetaminophen as directed by your caregiver.  Increasing usage of your inhaler if you have asthma. Zinc gel and zinc lozenges, taken in the first 24 hours of the common cold, can shorten the duration and lessen the severity of symptoms. Pain medicines may help with fever, muscle aches, and throat pain. A variety of non-prescription medicines are available to treat congestion and runny nose. Your caregiver   can make recommendations and may suggest nasal or lung inhalers for other symptoms.  HOME CARE INSTRUCTIONS   Only take over-the-counter or prescription medicines for pain, discomfort, or fever as directed by your  caregiver.  Use a warm mist humidifier or inhale steam from a shower to increase air moisture. This may keep secretions moist and make it easier to breathe.  Drink enough water and fluids to keep your urine clear or pale yellow.  Rest as needed.  Return to work when your temperature has returned to normal or as your caregiver advises. You may need to stay home longer to avoid infecting others. You can also use a face mask and careful hand washing to prevent spread of the virus. SEEK MEDICAL CARE IF:   After the first few days, you feel you are getting worse rather than better.  You need your caregiver's advice about medicines to control symptoms.  You develop chills, worsening shortness of breath, or brown or red sputum. These may be signs of pneumonia.  You develop yellow or brown nasal discharge or pain in the face, especially when you bend forward. These may be signs of sinusitis.  You develop a fever, swollen neck glands, pain with swallowing, or white areas in the back of your throat. These may be signs of strep throat. SEEK IMMEDIATE MEDICAL CARE IF:   You have a fever.  You develop severe or persistent headache, ear pain, sinus pain, or chest pain.  You develop wheezing, a prolonged cough, cough up blood, or have a change in your usual mucus (if you have chronic lung disease).  You develop sore muscles or a stiff neck. Document Released: 12/05/2000 Document Revised: 09/03/2011 Document Reviewed: 09/16/2013 [employer] Patient Information 2015 [employer]. This information is not intended to replace advice given to you by your health care provider. Make sure you discuss any questions you have with your health care provider.  

## 2014-11-12 NOTE — Progress Notes (Signed)
Patient ID: Lisa [name], female   DOB: Jan 15, 1976, 39 y.o.   MRN: [id number]  HPI  Patient presents to the office for evaluation of cough.  It has been going on for 2 days.  Patient reports night > day, dry, barky, worse with lying down.  They also endorse change in voice, postnasal drip and congestion and runny nose.  They have tried antitussives or mucinex.  They report that nothing has worked.  They denies other sick contacts.  Review of Systems  Constitutional: Negative for fever, chills and malaise/fatigue.  HENT: Positive for congestion and sore throat. Negative for ear pain.   Respiratory: Positive for cough. Negative for sputum production, shortness of breath and wheezing.   Cardiovascular: Negative for chest pain, claudication and leg swelling.  Skin: Negative.   Neurological: Negative for headaches.    PE:  General:  Alert and non-toxic, WDWN, NAD HEENT: NCAT, PERLA, EOM normal, no occular discharge or erythema.  Nasal mucosal edema with sinus tenderness to palpation.  Oropharynx clear with minimal oropharyngeal edema and erythema.  Mucous membranes moist and pink. Neck:  Cervical adenopathy Chest:  RRR no MRGs.  Lungs clear to auscultation A&P with no wheezes rhonchi or rales.   Abdomen: +BS x 4 quadrants, soft, non-tender, no guarding, rigidity, or rebound. Skin: warm and dry no rash Neuro: A&Ox4, CN II-XII grossly intact  Assessment and Plan:   1. Acute URI  - predniSONE (DELTASONE) 20 MG tablet; 3 tabs po day one, then 2 tabs daily x 4 days  Dispense: 11 tablet; Refill: 0 - benzonatate (TESSALON PERLES) 100 MG capsule; Take 1 capsule (100 mg total) by mouth every 6 (six) hours as needed for cough.  Dispense: 30 capsule; Refill: 1 - Phenyleph-Promethazine-Cod 5-6.25-10 MG/5ML SYRP; Take 5 mLs by mouth at bedtime.  Dispense: 118 mL; Refill: 0 - azithromycin (ZITHROMAX Z-PAK) 250 MG tablet; 2 po day one, then 1 daily x 4 days  Dispense: 5 tablet; Refill: 0

## 2014-12-09 ENCOUNTER — Other Ambulatory Visit: Payer: Self-pay | Admitting: Physician Assistant

## 2014-12-10 LAB — TSH: TSH: 7.51 u[IU]/mL — ABNORMAL HIGH (ref 0.450–4.500)

## 2014-12-13 ENCOUNTER — Encounter: Payer: Self-pay | Admitting: Physician Assistant

## 2014-12-13 DIAGNOSIS — E785 Hyperlipidemia, unspecified: Secondary | ICD-10-CM | POA: Insufficient documentation

## 2014-12-13 DIAGNOSIS — D649 Anemia, unspecified: Secondary | ICD-10-CM | POA: Insufficient documentation

## 2014-12-13 DIAGNOSIS — E559 Vitamin D deficiency, unspecified: Secondary | ICD-10-CM | POA: Insufficient documentation

## 2014-12-22 ENCOUNTER — Other Ambulatory Visit: Payer: Self-pay

## 2014-12-22 MED ORDER — LEVOTHYROXINE SODIUM 200 MCG PO TABS: ORAL_TABLET | ORAL | Status: DC

## 2014-12-30 ENCOUNTER — Ambulatory Visit (INDEPENDENT_AMBULATORY_CARE_PROVIDER_SITE_OTHER): Payer: 59 | Admitting: Physician Assistant

## 2014-12-30 ENCOUNTER — Encounter: Payer: Self-pay | Admitting: Physician Assistant

## 2014-12-30 VITALS — BP 122/82 | HR 68 | Temp 97.7°F | Resp 16 | Ht 67.5 in | Wt 215.0 lb

## 2014-12-30 DIAGNOSIS — L918 Other hypertrophic disorders of the skin: Secondary | ICD-10-CM

## 2014-12-30 DIAGNOSIS — L089 Local infection of the skin and subcutaneous tissue, unspecified: Secondary | ICD-10-CM

## 2014-12-30 MED ORDER — PHENTERMINE HCL 37.5 MG PO TABS: 37.5000 mg | ORAL_TABLET | Freq: Every day | ORAL | Status: DC

## 2014-12-30 MED ORDER — METFORMIN HCL ER 500 MG PO TB24: ORAL_TABLET | ORAL | Status: DC

## 2014-12-30 NOTE — Patient Instructions (Signed)
We are starting you on Metformin to prevent or treat diabetes. Metformin does not cause low blood sugars. In order to create energy your cells need insulin and sugar but sometime your cells do not accept the insulin and this can cause increased sugars and decreased energy. The Metformin helps your cells accept insulin and the sugar to give you more energy.   The two most common side effects are nausea and diarrhea, follow these rules to avoid it! You can take imodium per box instructions when starting metformin if needed.   Rules of metformin: 1) start out slow with only one pill daily. Our goal for you is 4 pills a day or 2000mg  total.  2) take with your largest meal. 3) Take with least amount of carbs.   Call if you have any problems.   Wound Infection A wound infection happens when a type of germ (bacteria) starts growing in the wound. In some cases, this can cause the wound to break open. If cared for properly, the infected wound will heal from the inside to the outside. Wound infections need treatment. CAUSES An infection is caused by bacteria growing in the wound.  SYMPTOMS   Increase in redness, swelling, or pain at the wound site.  Increase in drainage at the wound site.  Wound or bandage (dressing) starts to smell bad.  Fever.  Feeling tired or fatigued.  Pus draining from the wound. TREATMENT  Your health care provider will prescribe antibiotic medicine. The wound infection should improve within 24 to 48 hours. Any redness around the wound should stop spreading and the wound should be less painful.  HOME CARE INSTRUCTIONS   Only take over-the-counter or prescription medicines for pain, discomfort, or fever as directed by your health care provider.  Take your antibiotics as directed. Finish them even if you start to feel better.  Gently wash the area with mild soap and water 2 times a day, or as directed. Rinse off the soap. Pat the area dry with a clean towel. Do not rub  the wound. This may cause bleeding.  Follow your health care provider's instructions for how often you need to change the dressing.  Apply ointment and a dressing to the wound as directed.  If the dressing sticks, moisten it with soapy water and gently remove it.  Change the bandage right away if it becomes wet, dirty, or develops a bad smell.  Take showers. Do not take tub baths, swim, or do anything that may soak the wound until it is healed.  Avoid exercises that make you sweat heavily.  Use anti-itch medicine as directed by your health care provider. The wound may itch when it is healing. Do not pick or scratch at the wound.  Follow up with your health care provider to get your wound rechecked as directed. SEEK MEDICAL CARE IF:  You have an increase in swelling, pain, or redness around the wound.  You have an increase in the amount of pus coming from the wound.  There is a bad smell coming from the wound.  More of the wound breaks open.  You have a fever. MAKE SURE YOU:   Understand these instructions.  Will watch your condition.  Will get help right away if you are not doing well or get worse. Document Released: 03/10/2003 Document Revised: 06/16/2013 Document Reviewed: 10/15/2010 [facility] Patient Information 2015 [place], [place]. This information is not intended to replace advice given to you by your health care provider. Make sure you  discuss any questions you have with your health care provider.  

## 2014-12-30 NOTE — Progress Notes (Signed)
Skin Tag Removal Procedure Note  Pre-operative Diagnosis: Classic skin tags (acrochordon)  Post-operative Diagnosis: Classic skin tags (acrochordon)  Locations back and breast, get caught on her bra  Indications:  Patient complains of skin tags that are recurrently irritated.   Anesthesia: Marcaine with epi  Procedure Details  The risks (including bleeding and infection) and benefits of the procedure and Verbal informed consent obtained. Using sterile iris scissors, multiple skin tags were snipped off at their bases after cleansing with Alcohol.  Bleeding was controlled by pressure.   Procedure Code  11200 Skins tags up to 15  Findings: Pathognomonic benign lesions  not sent for pathological exam.  Condition: Stable  Complications: bleeding.  Plan: 1. Instructed to keep the wounds dry and covered for 24-48h and clean thereafter. 2. Warning signs of infection were reviewed.   3. Recommended that the patient use OTC acetaminophen as needed for pain.  4. Return as needed.

## 2015-01-10 ENCOUNTER — Other Ambulatory Visit: Payer: Self-pay | Admitting: Physician Assistant

## 2015-01-11 LAB — TSH: TSH: 1.4 u[IU]/mL (ref 0.450–4.500)

## 2015-01-12 ENCOUNTER — Other Ambulatory Visit: Payer: Self-pay | Admitting: Physician Assistant

## 2015-01-12 MED ORDER — LEVOTHYROXINE SODIUM 300 MCG PO TABS: 300.0000 ug | ORAL_TABLET | Freq: Every day | ORAL | Status: DC

## 2015-03-14 ENCOUNTER — Encounter: Payer: Self-pay | Admitting: Physician Assistant

## 2015-03-14 ENCOUNTER — Ambulatory Visit (INDEPENDENT_AMBULATORY_CARE_PROVIDER_SITE_OTHER): Payer: 59 | Admitting: Physician Assistant

## 2015-03-14 ENCOUNTER — Ambulatory Visit: Payer: Self-pay | Admitting: Physician Assistant

## 2015-03-14 VITALS — BP 126/78 | HR 92 | Temp 97.7°F | Resp 16 | Ht 67.5 in | Wt 223.4 lb

## 2015-03-14 DIAGNOSIS — J069 Acute upper respiratory infection, unspecified: Secondary | ICD-10-CM

## 2015-03-14 DIAGNOSIS — L918 Other hypertrophic disorders of the skin: Secondary | ICD-10-CM | POA: Diagnosis not present

## 2015-03-14 DIAGNOSIS — E669 Obesity, unspecified: Secondary | ICD-10-CM | POA: Diagnosis not present

## 2015-03-14 DIAGNOSIS — E039 Hypothyroidism, unspecified: Secondary | ICD-10-CM | POA: Diagnosis not present

## 2015-03-14 DIAGNOSIS — L089 Local infection of the skin and subcutaneous tissue, unspecified: Secondary | ICD-10-CM

## 2015-03-14 MED ORDER — PHENTERMINE HCL 37.5 MG PO TABS: 37.5000 mg | ORAL_TABLET | Freq: Every day | ORAL | Status: DC

## 2015-03-14 MED ORDER — AZITHROMYCIN 250 MG PO TABS: ORAL_TABLET | ORAL | Status: AC

## 2015-03-14 MED ORDER — HYDROCODONE-ACETAMINOPHEN 5-325 MG PO TABS: ORAL_TABLET | ORAL | Status: DC

## 2015-03-14 MED ORDER — PREDNISONE 20 MG PO TABS: ORAL_TABLET | ORAL | Status: DC

## 2015-03-14 MED ORDER — VITAMIN D (ERGOCALCIFEROL) 1.25 MG (50000 UNIT) PO CAPS: ORAL_CAPSULE | ORAL | Status: DC

## 2015-03-14 NOTE — Progress Notes (Signed)
Assessment and Plan:  1. Inflamed skin tag Removal, no complications  2. Acute URI zpak  3. Obesity Obesity with co morbidities- long discussion about weight loss, diet, and exercise  4. Hypothyroidism, unspecified hypothyroidism type Hypothyroidism-check TSH level, continue medications the same, reminded to take on an empty stomach 30-101mins before food.    Continue diet and meds as discussed. Further disposition pending results of labs. Over 30 minutes of exam, counseling, chart review, and critical decision making was performed  HPI 39 y.o. female  presents for 3 month follow up on hypertension, cholesterol, prediabetes, and vitamin D deficiency.   Her blood pressure has been controlled at home, today their BP is BP: 126/78 mmHg  She does not workout. She denies chest pain, shortness of breath, dizziness. She does complain of cough/congestion x 3 weeks, will get better than worse. Cough has improved some but still has fatigue and decreased appetite, no fever some chills.   She is not on cholesterol medication and denies myalgias. Her cholesterol is not at goal. The cholesterol last visit was:   Lab Results  Component Value Date   CHOL 187 10/07/2014   TRIG 212* 10/07/2014    She has been working on diet and exercise for prediabetes, she had it ch she is on metformin for insulin resistance, and denies paresthesia of the feet, polydipsia, polyuria and visual disturbances. Last A1C in the office was:  Lab Results  Component Value Date   HGBA1C 5.9* 10/07/2014   Patient is on Vitamin D supplement.   Lab Results  Component Value Date   VD25OH 21.1* 10/07/2014     She is on thyroid medication. Her medication was not changed last visit.   Lab Results  Component Value Date   TSH 1.400 01/10/2015  .  BMI is Body mass index is 34.45 kg/(m^2)., she is working on diet and exercise. Wt Readings from Last 3 Encounters:  03/14/15 223 lb 6.4 oz (101.334 kg)  12/30/14 215 lb (97.523  kg)  11/12/14 213 lb (96.616 kg)   She has several skin tags that are bothering her   Current Medications:  Current Outpatient Prescriptions on File Prior to Visit  Medication Sig Dispense Refill  . diazepam (VALIUM) 5 MG tablet 1/2 to 1 daily as needed for severe anxiety 30 tablet 0  . etonogestrel-ethinyl estradiol (NUVARING) 0.12-0.015 MG/24HR vaginal ring Insert one per vagina every 21 days for a total of 4 consecutive readings. Take a three-day break and then start with one ring every 21 days for a total of 4 readings again Please note this requires further rings every 3 months 4 each 3  . levothyroxine (SYNTHROID, LEVOTHROID) 300 MCG tablet Take 1 tablet (300 mcg total) by mouth daily before breakfast. 90 tablet 1  . metFORMIN (GLUCOPHAGE XR) 500 MG 24 hr tablet 1-2 times daily with largest meal, start on 1 and can go up to 2 a day. 180 tablet 3  . omeprazole (PRILOSEC) 20 MG capsule     . phentermine (ADIPEX-P) 37.5 MG tablet Take 1 tablet (37.5 mg total) by mouth daily before breakfast. 30 tablet 2  . traZODone (DESYREL) 50 MG tablet Take 50 mg by mouth at bedtime.    . Valproic Acid 250 MG CPDR Take 250 mg by mouth 3 (three) times daily.     No current facility-administered medications on file prior to visit.   Medical History:  Past Medical History  Diagnosis Date  . Depression   . Headache(784.0)   .  Seizures   . Anxiety   . Asthma   . History of diabetes mellitus     prior to gastric bypass in 2012  . Abdominal cramping 08/10/2011  . Obesity     BMi 33  . Hypothyroidism   . UTI (lower urinary tract infection) 08/14/2011  . Habitual abortion history, antepartum    Allergies:  Allergies  Allergen Reactions  . Codeine Itching, Rash and Other (See Comments)    OPIOIDS - MORPHINE & RELATED     Review of Systems:  Review of Systems  Constitutional: Negative for fever, chills and malaise/fatigue.  HENT: Positive for congestion and sore throat. Negative for ear pain.    Respiratory: Positive for cough. Negative for sputum production, shortness of breath and wheezing.   Cardiovascular: Negative for chest pain, claudication and leg swelling.  Skin: Negative.   Neurological: Negative for headaches.    Family history- Review and unchanged Social history- Review and unchanged Physical Exam: BP 126/78 mmHg  Pulse 92  Temp(Src) 97.7 F (36.5 C)  Resp 16  Ht 5' 7.5" (1.715 m)  Wt 223 lb 6.4 oz (101.334 kg)  BMI 34.45 kg/m2 Wt Readings from Last 3 Encounters:  03/14/15 223 lb 6.4 oz (101.334 kg)  12/30/14 215 lb (97.523 kg)  11/12/14 213 lb (96.616 kg)   General Appearance: Well nourished, in no apparent distress. Eyes: PERRLA, EOMs, conjunctiva no swelling or erythema Sinuses: No Frontal/maxillary tenderness ENT/Mouth: Ext aud canals clear, TMs without erythema, bulging. No erythema, swelling, or exudate on post pharynx.  Tonsils not swollen or erythematous. Hearing normal.  Neck: Supple, thyroid normal.  Respiratory: Respiratory effort normal, BS equal bilaterally without rales, rhonchi, wheezing or stridor.  Cardio: RRR with no MRGs. Brisk peripheral pulses without edema.  Abdomen: Soft, + BS,  Non tender, no guarding, rebound, hernias, masses. Lymphatics: Non tender without lymphadenopathy.  Musculoskeletal: Full ROM, 5/5 strength, Normal gait Skin: Several irritated skin tags along neck line and chest. Warm, dry without rashes, lesions, ecchymosis.  Neuro: Cranial nerves intact. Normal muscle tone, no cerebellar symptoms. Psych: Awake and oriented X 3, normal affect, Insight and Judgment appropriate.   Skin Tag Removal Procedure Note  Anesthesia: Marciane with epi  Procedure Details  The risks (including bleeding and infection) and benefits of the procedure and Verbal informed consent obtained. Using sterile iris scissors, multiple skin tags were snipped off at their bases after cleansing with Alcohol.  Bleeding was controlled by pressure.    Procedure Code  11200 Skins tags up to 15  Findings: Pathognomonic benign lesions  not sent for pathological exam.  Condition: Stable  Complications: bleeding.  Plan: 1. Instructed to keep the wounds dry and covered for 24-48h and clean thereafter. 2. Warning signs of infection were reviewed.   3. Recommended that the patient use Naproxen and OTC acetaminophen as needed for pain.  4. Return as needed.  Vicie Mutters, PA-C 4:17 PM [facility]

## 2015-03-14 NOTE — Patient Instructions (Signed)
I will give you a prescription for an antibiotic, but please only take it if you are not feeling better in 7-10 days.  Bronchitis is mostly caused by viruses and the antibiotic will do nothing.  PLEASE TRY TO DO OVER THE COUNTER TREATMENT AND/OR PREDNISONE FOR 5-7 DAYS AND IF YOU ARE NOT GETTING BETTER OR GETTING WORSE THEN YOU CAN START ON AN ANTIBIOTIC GIVEN.  Can take the prednisone AT NIGHT WITH DINNER, it take 8-12 hours to start working so it will NOT affect your sleeping if you take it at night with your food!! Take two pills the first night and 1 or two pill the second night and then 1 pill the other nights.    Rest and stay hydrated.  Make sure you drink plenty of fluids to make sure urine is clear when you urinate.  Water will help thin out mucous. - Take Mucinex DM- Maximum Strength over the counter to thin out and cough up the thick mucous.  Please follow directions on box. -Take Albuterol if prescribed.  Risk of antibiotic use: About 1 in 4 people who take antibiotics have side effects including stomach problems, dizziness, or rashes. Those problems clear up soon after stopping the drugs, but in rare cases antibiotics can cause severe allergic reaction. Over use of antibiotics also encourages the growth of bacteria that can't be controlled easily with drugs. That makes you more vunerable to antibiotic-resistant infections and undermines the benefits of antibiotics for others.   Waste of Money: Antibiotics often aren't very expensive, but any money spent on unnecessary drugs is money down the drain.   When are antibiotics needed? Only when symptoms last longer than a week.  Start to improve but then worsen again  Please call the office or message through My Chart if you have any questions.   Acute Bronchitis Bronchitis is when the airways that extend from the windpipe into the lungs get red, puffy, and painful (inflamed). Bronchitis often causes thick spit (mucus) to develop. This  leads to a cough. A cough is the most common symptom of bronchitis. In acute bronchitis, the condition usually begins suddenly and goes away over time (usually in 2 weeks). Smoking, allergies, and asthma can make bronchitis worse. Repeated episodes of bronchitis may cause more lung problems.  Most common cause of Bronchitis is viruses (rhinovirus, coronavirus, RSV).  Therefore, not requiring an antibiotic; as antibiotics only treat bacterial infections.  HOME CARE  Rest.  Drink enough fluids to keep your pee (urine) clear or pale yellow (unless you need to limit fluids as told by your doctor).  Only take over-the-counter or prescription medicines as told by your doctor.  Avoid smoking and secondhand smoke. These can make bronchitis worse. If you are a smoker, think about using nicotine gum or skin patches. Quitting smoking will help your lungs heal faster.  Reduce the chance of getting bronchitis again by:  Washing your hands often.  Avoiding people with cold symptoms.  Trying not to touch your hands to your mouth, nose, or eyes.  Follow up with your doctor as told. GET HELP IF: Your symptoms do not improve after 1 week of treatment. Symptoms include:  Cough.  Fever.  Coughing up thick spit.  Body aches.  Chest congestion.  Chills.  Shortness of breath.  Sore throat. GET HELP RIGHT AWAY IF:   You have an increased fever.  You have chills.  You have severe shortness of breath.  You have bloody thick spit (sputum).    You throw up (vomit) often.  You lose too much body fluid (dehydration).  You have a severe headache.  You faint. MAKE SURE YOU:   Understand these instructions.  Will watch your condition.  Will get help right away if you are not doing well or get worse. Document Released: 11/28/2007 Document Revised: 02/11/2013 Document Reviewed: 12/02/2012 [facility] Patient Information 2015 [place], [place]. This information is not intended to replace  advice given to you by your health care provider. Make sure you discuss any questions you have with your health care provider.   Vitamin D goal is between 60-80  Please make sure that you are taking your Vitamin D as directed.   It is very important as a natural anti-inflammatory   helping hair, skin, and nails, as well as reducing stroke and heart attack risk.   It helps your bones and helps with mood.  It also decreases numerous cancer risks so please take it as directed.   Low Vit D is associated with a 200-300% higher risk for CANCER   and 200-300% higher risk for HEART   ATTACK  &  STROKE.    .....................................Marland Kitchen  It is also associated with higher death rate at younger ages,   autoimmune diseases like Rheumatoid arthritis, Lupus, Multiple Sclerosis.     Also many other serious conditions, like depression, Alzheimer's  Dementia, infertility, muscle aches, fatigue, fibromyalgia - just to name a few.  +++++++++++++++++++

## 2015-04-12 ENCOUNTER — Other Ambulatory Visit: Payer: Self-pay | Admitting: Physician Assistant

## 2015-04-13 LAB — BASIC METABOLIC PANEL
BUN / CREAT RATIO: 10 (ref 8–20)
BUN: 7 mg/dL (ref 6–20)
CO2: 20 mmol/L (ref 18–29)
CREATININE: 0.68 mg/dL (ref 0.57–1.00)
Calcium: 9.3 mg/dL (ref 8.7–10.2)
Chloride: 99 mmol/L (ref 97–106)
GFR calc Af Amer: 128 mL/min/{1.73_m2} (ref >59)
GFR, EST NON AFRICAN AMERICAN: 111 mL/min/{1.73_m2} (ref >59)
Glucose: 150 mg/dL — ABNORMAL HIGH (ref 65–99)
POTASSIUM: 4.2 mmol/L (ref 3.5–5.2)
SODIUM: 137 mmol/L (ref 136–144)

## 2015-04-13 LAB — HEPATIC FUNCTION PANEL
ALBUMIN: 4.2 g/dL (ref 3.5–5.5)
ALT: 51 IU/L — ABNORMAL HIGH (ref 0–32)
AST: 35 IU/L (ref 0–40)
Alkaline Phosphatase: 53 IU/L (ref 39–117)
BILIRUBIN TOTAL: 0.3 mg/dL (ref 0.0–1.2)
BILIRUBIN, DIRECT: 0.1 mg/dL (ref 0.00–0.40)
TOTAL PROTEIN: 7.3 g/dL (ref 6.0–8.5)

## 2015-04-13 LAB — CBC WITH DIFFERENTIAL/PLATELET
BASOS: 1 %
Basophils Absolute: 0.1 10*3/uL (ref 0.0–0.2)
EOS (ABSOLUTE): 0.4 10*3/uL (ref 0.0–0.4)
Eos: 6 %
HEMOGLOBIN: 10.1 g/dL — AB (ref 11.1–15.9)
Hematocrit: 32.3 % — ABNORMAL LOW (ref 34.0–46.6)
IMMATURE GRANS (ABS): 0 10*3/uL (ref 0.0–0.1)
Immature Granulocytes: 0 %
LYMPHS: 38 %
Lymphocytes Absolute: 2.9 10*3/uL (ref 0.7–3.1)
MCH: 23.5 pg — ABNORMAL LOW (ref 26.6–33.0)
MCHC: 31.3 g/dL — ABNORMAL LOW (ref 31.5–35.7)
MCV: 75 fL — AB (ref 79–97)
MONOCYTES: 9 %
Monocytes Absolute: 0.7 10*3/uL (ref 0.1–0.9)
Neutrophils Absolute: 3.5 10*3/uL (ref 1.4–7.0)
Neutrophils: 46 %
Platelets: 444 10*3/uL — ABNORMAL HIGH (ref 150–379)
RBC: 4.29 x10E6/uL (ref 3.77–5.28)
RDW: 16.9 % — ABNORMAL HIGH (ref 12.3–15.4)
WBC: 7.6 10*3/uL (ref 3.4–10.8)

## 2015-04-13 LAB — IRON AND TIBC
IRON SATURATION: 3 % — AB (ref 15–55)
IRON: 18 ug/dL — AB (ref 27–159)
Total Iron Binding Capacity: 606 ug/dL (ref 250–450)
UIBC: 588 ug/dL — ABNORMAL HIGH (ref 131–425)

## 2015-04-13 LAB — TSH: TSH: 1.64 u[IU]/mL (ref 0.450–4.500)

## 2015-04-13 LAB — VITAMIN D 25 HYDROXY (VIT D DEFICIENCY, FRACTURES): VIT D 25 HYDROXY: 42.4 ng/mL (ref 30.0–100.0)

## 2015-04-13 LAB — LIPID PANEL WITH LDL/HDL RATIO
Cholesterol, Total: 206 mg/dL — ABNORMAL HIGH (ref 100–199)
HDL: 67 mg/dL (ref >39)
LDL CALC: 102 mg/dL — AB (ref 0–99)
LDL/HDL RATIO: 1.5 ratio (ref 0.0–3.2)
Triglycerides: 183 mg/dL — ABNORMAL HIGH (ref 0–149)
VLDL Cholesterol Cal: 37 mg/dL (ref 5–40)

## 2015-04-13 LAB — HGB A1C W/O EAG: HEMOGLOBIN A1C: 6.8 % — AB (ref 4.8–5.6)

## 2015-04-13 LAB — INSULIN, RANDOM: INSULIN: 19.9 u[IU]/mL (ref 2.6–24.9)

## 2015-04-13 LAB — FERRITIN: FERRITIN: 8 ng/mL — AB (ref 15–150)

## 2015-04-14 ENCOUNTER — Encounter: Payer: Self-pay | Admitting: Physician Assistant

## 2015-04-14 ENCOUNTER — Ambulatory Visit: Payer: Self-pay | Admitting: Physician Assistant

## 2015-04-14 ENCOUNTER — Ambulatory Visit (INDEPENDENT_AMBULATORY_CARE_PROVIDER_SITE_OTHER): Payer: 59 | Admitting: Physician Assistant

## 2015-04-14 VITALS — BP 126/60 | HR 100 | Temp 97.3°F | Resp 16 | Ht 67.5 in | Wt 225.0 lb

## 2015-04-14 DIAGNOSIS — E785 Hyperlipidemia, unspecified: Secondary | ICD-10-CM | POA: Diagnosis not present

## 2015-04-14 DIAGNOSIS — E039 Hypothyroidism, unspecified: Secondary | ICD-10-CM

## 2015-04-14 DIAGNOSIS — E669 Obesity, unspecified: Secondary | ICD-10-CM

## 2015-04-14 DIAGNOSIS — G40909 Epilepsy, unspecified, not intractable, without status epilepticus: Secondary | ICD-10-CM | POA: Diagnosis not present

## 2015-04-14 DIAGNOSIS — F329 Major depressive disorder, single episode, unspecified: Secondary | ICD-10-CM

## 2015-04-14 DIAGNOSIS — F32A Depression, unspecified: Secondary | ICD-10-CM

## 2015-04-14 DIAGNOSIS — D649 Anemia, unspecified: Secondary | ICD-10-CM

## 2015-04-14 DIAGNOSIS — E559 Vitamin D deficiency, unspecified: Secondary | ICD-10-CM

## 2015-04-14 DIAGNOSIS — R1013 Epigastric pain: Secondary | ICD-10-CM

## 2015-04-14 DIAGNOSIS — Z79899 Other long term (current) drug therapy: Secondary | ICD-10-CM

## 2015-04-14 DIAGNOSIS — E119 Type 2 diabetes mellitus without complications: Secondary | ICD-10-CM

## 2015-04-14 DIAGNOSIS — G4733 Obstructive sleep apnea (adult) (pediatric): Secondary | ICD-10-CM

## 2015-04-14 MED ORDER — METFORMIN HCL ER 500 MG PO TB24: ORAL_TABLET | ORAL | Status: DC

## 2015-04-14 MED ORDER — ESOMEPRAZOLE MAGNESIUM 40 MG PO CPDR: 40.0000 mg | DELAYED_RELEASE_CAPSULE | Freq: Every day | ORAL | Status: DC

## 2015-04-14 MED ORDER — SUCRALFATE 1 G PO TABS: 1.0000 g | ORAL_TABLET | Freq: Three times a day (TID) | ORAL | Status: DC

## 2015-04-14 MED ORDER — VITAMIN D (ERGOCALCIFEROL) 1.25 MG (50000 UNIT) PO CAPS: ORAL_CAPSULE | ORAL | Status: DC

## 2015-04-14 NOTE — Progress Notes (Signed)
Assessment and Plan:  1. Hypertension -Continue medication, monitor blood pressure at home. Continue DASH diet.  Reminder to go to the ER if any CP, SOB, nausea, dizziness, severe HA, changes vision/speech, left arm numbness and tingling and jaw pain.  2. Cholesterol -Continue diet and exercise. Check cholesterol.   3. Diabetes without complications -Continue diet and exercise. Check A1C Continue metformin will add on invokana samples   4. Vitamin D Def - check level and continue medications.   5. Obesity with co morbidities - long discussion about weight loss, diet, and exercise  6. Hypothyroidism -check TSH level, continue medications the same, reminded to take on an empty stomach 30-4mins before food.   7. Patient is s/p gastric bypass - get on iron daily  8. Epigastric RUQ pain DDX pancreatitis, PUD, choley Normal LFTs, mild RUQ pain no McMurphy, vitals stable Will treat with nexium BID and carafate Liquids/bland diet  9. Probable sleep apnea - obesity, crowded mouth, insomnia, get sleep study, weight loss advised.    Over 30 minutes of exam, counseling, chart review, and critical decision making was performed   HPI 39 y.o. white female  presents for 3 month follow up on hypertension, cholesterol, diabetes and vitamin D deficiency.   Her blood pressure has been controlled at home, today her BP is BP: 126/60 mmHg.  She does not workout at this time but wants to start. She denies chest pain, shortness of breath, dizziness.  She is not on cholesterol medication and denies myalgias. Her cholesterol is at goal. The cholesterol was:   Lab Results  Component Value Date   CHOL 206* 04/12/2015   HDL 67 04/12/2015   LDLCALC 102* 04/12/2015   TRIG 183* 04/12/2015   She has a history of seizures x age of 34 but has been stable on valproic acid 250, has not had a seizure in a decade.  She has history of gastric bypass but due to stress and poor eating habits has started to  gain back her weight 4 year s/p bypass, starting weight was 260. She has iron def due to this. She had a drop in H/H over 6 months, also complain of epigastric pain under diaphragm on Monday night, no radiation to her back, worse with lying down, worse with certain foods like gold fish, larger meals- took her mom's carafate that has been helping. Has had worse GERD, reflux into her throat. Changed diet and has been doing better.  Body mass index is 34.7 kg/(m^2).   Wt Readings from Last 3 Encounters:  04/14/15 225 lb (102.059 kg)  03/14/15 223 lb 6.4 oz (101.334 kg)  12/30/14 215 lb (97.523 kg)   Lab Results  Component Value Date   IRON 18* 04/12/2015   TIBC 606* 04/12/2015   FERRITIN 8* 04/12/2015   She is on thyroid medication. Her medication was not changed last visit.   Lab Results  Component Value Date   TSH 1.640 04/12/2015  .   She has been working on diet and exercise for prediabetes, last visit her A1C has gone from preDM to DM range without complications , she is not on bASA, she is not on ACE/ARB, has been on metformin 2 a day and denies  paresthesia of the feet, polydipsia, polyuria and visual disturbances. Last A1C was:  Lab Results  Component Value Date   HGBA1C 6.8* 04/12/2015    Patient is on Vitamin D supplement. Lab Results  Component Value Date   VD25OH 42.4 04/12/2015  Is on Nuva ring continously, had LEEP in July/August, has been having spotting/cramping x 1 month.   Current Medications:  Current Outpatient Prescriptions on File Prior to Visit  Medication Sig Dispense Refill  . diazepam (VALIUM) 5 MG tablet 1/2 to 1 daily as needed for severe anxiety 30 tablet 0  . etonogestrel-ethinyl estradiol (NUVARING) 0.12-0.015 MG/24HR vaginal ring Insert one per vagina every 21 days for a total of 4 consecutive readings. Take a three-day break and then start with one ring every 21 days for a total of 4 readings again Please note this requires further rings every 3  months 4 each 3  . levothyroxine (SYNTHROID, LEVOTHROID) 300 MCG tablet Take 1 tablet (300 mcg total) by mouth daily before breakfast. 90 tablet 1  . metFORMIN (GLUCOPHAGE XR) 500 MG 24 hr tablet 1-2 times daily with largest meal, start on 1 and can go up to 2 a day. 180 tablet 3  . omeprazole (PRILOSEC) 20 MG capsule     . phentermine (ADIPEX-P) 37.5 MG tablet Take 1 tablet (37.5 mg total) by mouth daily before breakfast. 30 tablet 2  . traZODone (DESYREL) 50 MG tablet Take 50 mg by mouth at bedtime.    . Valproic Acid 250 MG CPDR Take 250 mg by mouth 3 (three) times daily.    . Vitamin D, Ergocalciferol, (DRISDOL) 50000 UNITS CAPS capsule 1 tablet 3 times a week 30 capsule 1   No current facility-administered medications on file prior to visit.   Medical History:  Past Medical History  Diagnosis Date  . Depression   . Headache(784.0)   . Seizures ([place])   . Anxiety   . Asthma   . History of diabetes mellitus     prior to gastric bypass in 2012  . Abdominal cramping 08/10/2011  . Obesity     BMi 33  . Hypothyroidism   . UTI (lower urinary tract infection) 08/14/2011  . Habitual abortion history, antepartum    Allergies:  Allergies  Allergen Reactions  . Codeine Itching, Rash and Other (See Comments)    OPIOIDS - MORPHINE & RELATED     Review of Systems:  Review of Systems  Constitutional: Positive for malaise/fatigue. Negative for fever, chills, weight loss and diaphoresis.  HENT: Negative.   Respiratory: Negative.   Cardiovascular: Negative.   Gastrointestinal: Positive for heartburn, nausea and abdominal pain. Negative for vomiting, diarrhea, constipation, blood in stool and melena.  Genitourinary: Negative.   Musculoskeletal: Negative.   Skin: Negative.   Neurological: Negative.  Negative for weakness.  Psychiatric/Behavioral: Positive for depression. Negative for suicidal ideas, hallucinations, memory loss and substance abuse. The patient is nervous/anxious and has  insomnia.     Family history- Review and unchanged Social history- Review and unchanged Physical Exam: BP 126/60 mmHg  Pulse 100  Temp(Src) 97.3 F (36.3 C) (Temporal)  Resp 16  Ht 5' 7.5" (1.715 m)  Wt 225 lb (102.059 kg)  BMI 34.70 kg/m2  SpO2 99%  LMP 10/27/2014 Wt Readings from Last 3 Encounters:  04/14/15 225 lb (102.059 kg)  03/14/15 223 lb 6.4 oz (101.334 kg)  12/30/14 215 lb (97.523 kg)   General Appearance: Well nourished, in no apparent distress. Eyes: PERRLA, EOMs, conjunctiva no swelling or erythema Sinuses: No Frontal/maxillary tenderness ENT/Mouth: Ext aud canals clear, TMs without erythema, bulging. No erythema, swelling, or exudate on post pharynx.  Tonsils not swollen or erythematous. Hearing normal. Crowded mouth Neck: Supple, thyroid normal.  Respiratory: Respiratory effort normal, BS equal bilaterally  without rales, rhonchi, wheezing or stridor.  Cardio: RRR with no MRGs. Brisk peripheral pulses without edema.  Abdomen: Soft, hyperactive BS. +RUQ  And worse epigastric tenderness, neg mcmurphy, no guarding, rebound, hernias, masses. Lymphatics: Non tender without lymphadenopathy.  Musculoskeletal: Full ROM, 5/5 strength, Normal gait Skin: Warm, dry without rashes, lesions, ecchymosis.  Neuro: Cranial nerves intact. No cerebellar symptoms.  Psych: Awake and oriented X 3, normal affect, Insight and Judgment appropriate.    Vicie Mutters, PA-C 9:04 AM [facility]

## 2015-04-14 NOTE — Patient Instructions (Addendum)
1) take nexium twice a day for 2 week, break apart  Do carafate as needed up to 4 x a day  2) Take iron 65 mg or 325 mg WITH vitamin C and probiotic every day x 1-2 weeks then 3-4 days a week.   3) Please start 100 of Invokana for 1 week then go up to 300mg  of Invokana. This can decreases your blood pressure so please contact us if you have any dizziness, sometime we need to decrease or stop fluid pills or decrease BP meds. You are peeing out 300-400 calories a day of sugar which can lead to yeast infections, you can take the diflucan as needed but if the infections continue we will stop the medications. We will have you follow up in 1 month to check your kidney function and weight. Call if you need anything.    Food Choices for Gastroesophageal Reflux Disease, Adult When you have gastroesophageal reflux disease (GERD), the foods you eat and your eating habits are very important. Choosing the right foods can help ease the discomfort of GERD. WHAT GENERAL GUIDELINES DO I NEED TO FOLLOW?  Choose fruits, vegetables, whole grains, low-fat dairy products, and low-fat meat, fish, and poultry.  Limit fats such as oils, salad dressings, butter, nuts, and avocado.  Keep a food diary to identify foods that cause symptoms.  Avoid foods that cause reflux. These may be different for different people.  Eat frequent small meals instead of three large meals each day.  Eat your meals slowly, in a relaxed setting.  Limit fried foods.  Cook foods using methods other than frying.  Avoid drinking alcohol.  Avoid drinking large amounts of liquids with your meals.  Avoid bending over or lying down until 2-3 hours after eating. WHAT FOODS ARE NOT RECOMMENDED? The following are some foods and drinks that may worsen your symptoms: Vegetables Tomatoes. Tomato juice. Tomato and spaghetti sauce. Chili peppers. Onion and garlic. Horseradish. Fruits Oranges, grapefruit, and lemon (fruit and  juice). Meats High-fat meats, fish, and poultry. This includes hot dogs, ribs, ham, sausage, salami, and bacon. Dairy Whole milk and chocolate milk. Sour cream. Cream. Butter. Ice cream. Cream cheese.  Beverages Coffee and tea, with or without caffeine. Carbonated beverages or energy drinks. Condiments Hot sauce. Barbecue sauce.  Sweets/Desserts Chocolate and cocoa. Donuts. Peppermint and spearmint. Fats and Oils High-fat foods, including [place] fries and potato chips. Other Vinegar. Strong spices, such as black pepper, white pepper, red pepper, cayenne, curry powder, cloves, ginger, and chili powder. The items listed above may not be a complete list of foods and beverages to avoid. Contact your dietitian for more information.   This information is not intended to replace advice given to you by your health care provider. Make sure you discuss any questions you have with your health care provider.   Document Released: 06/11/2005 Document Revised: 07/02/2014 Document Reviewed: 04/15/2013 Elsevier Interactive Patient Education [employer].

## 2015-04-15 MED ORDER — METFORMIN HCL ER 500 MG PO TB24: ORAL_TABLET | ORAL | Status: DC

## 2015-04-15 NOTE — Addendum Note (Signed)
Addended by: Vicie Mutters R on: 04/15/2015 08:20 AM   Modules accepted: Orders

## 2015-04-21 ENCOUNTER — Other Ambulatory Visit: Payer: Self-pay | Admitting: Physician Assistant

## 2015-04-22 LAB — HEPATIC FUNCTION PANEL
ALBUMIN: 4.4 g/dL (ref 3.5–5.5)
ALT: 71 IU/L — ABNORMAL HIGH (ref 0–32)
AST: 52 IU/L — ABNORMAL HIGH (ref 0–40)
Alkaline Phosphatase: 43 IU/L (ref 39–117)
BILIRUBIN, DIRECT: 0.14 mg/dL (ref 0.00–0.40)
Bilirubin Total: 0.5 mg/dL (ref 0.0–1.2)
TOTAL PROTEIN: 7 g/dL (ref 6.0–8.5)

## 2015-04-22 LAB — AMYLASE: AMYLASE: 49 U/L (ref 31–124)

## 2015-04-22 LAB — LIPASE: LIPASE: 34 U/L (ref 0–59)

## 2015-05-10 ENCOUNTER — Other Ambulatory Visit: Payer: Self-pay | Admitting: Physician Assistant

## 2015-05-10 MED ORDER — FLUCONAZOLE 150 MG PO TABS: 150.0000 mg | ORAL_TABLET | Freq: Every day | ORAL | Status: DC

## 2015-05-12 ENCOUNTER — Other Ambulatory Visit: Payer: Self-pay | Admitting: Physician Assistant

## 2015-05-13 LAB — CBC WITH DIFFERENTIAL/PLATELET
Basophils Absolute: 0 10*3/uL (ref 0.0–0.2)
Basos: 1 %
EOS (ABSOLUTE): 0.3 10*3/uL (ref 0.0–0.4)
EOS: 5 %
HEMATOCRIT: 31.6 % — AB (ref 34.0–46.6)
HEMOGLOBIN: 9.8 g/dL — AB (ref 11.1–15.9)
IMMATURE GRANS (ABS): 0 10*3/uL (ref 0.0–0.1)
IMMATURE GRANULOCYTES: 0 %
Lymphocytes Absolute: 1.8 10*3/uL (ref 0.7–3.1)
Lymphs: 28 %
MCH: 23.1 pg — ABNORMAL LOW (ref 26.6–33.0)
MCHC: 31 g/dL — ABNORMAL LOW (ref 31.5–35.7)
MCV: 74 fL — ABNORMAL LOW (ref 79–97)
MONOCYTES: 7 %
Monocytes Absolute: 0.5 10*3/uL (ref 0.1–0.9)
NEUTROS PCT: 59 %
Neutrophils Absolute: 3.9 10*3/uL (ref 1.4–7.0)
Platelets: 375 10*3/uL (ref 150–379)
RBC: 4.25 x10E6/uL (ref 3.77–5.28)
RDW: 17.9 % — ABNORMAL HIGH (ref 12.3–15.4)
WBC: 6.5 10*3/uL (ref 3.4–10.8)

## 2015-05-13 LAB — HEPATIC FUNCTION PANEL
ALK PHOS: 35 IU/L — AB (ref 39–117)
ALT: 64 IU/L — AB (ref 0–32)
AST: 35 IU/L (ref 0–40)
Albumin: 4.1 g/dL (ref 3.5–5.5)
BILIRUBIN, DIRECT: 0.09 mg/dL (ref 0.00–0.40)
Bilirubin Total: 0.4 mg/dL (ref 0.0–1.2)
TOTAL PROTEIN: 6.7 g/dL (ref 6.0–8.5)

## 2015-05-13 LAB — IRON AND TIBC
IRON: 25 ug/dL — AB (ref 27–159)
Iron Saturation: 5 % — CL (ref 15–55)
Total Iron Binding Capacity: 549 ug/dL — ABNORMAL HIGH (ref 250–450)
UIBC: 524 ug/dL — AB (ref 131–425)

## 2015-05-13 LAB — BASIC METABOLIC PANEL
BUN / CREAT RATIO: 14 (ref 8–20)
BUN: 10 mg/dL (ref 6–20)
CO2: 21 mmol/L (ref 18–29)
CREATININE: 0.71 mg/dL (ref 0.57–1.00)
Calcium: 9.1 mg/dL (ref 8.7–10.2)
GFR calc non Af Amer: 108 mL/min/{1.73_m2} (ref >59)
GFR, EST AFRICAN AMERICAN: 125 mL/min/{1.73_m2} (ref >59)
Glucose: 147 mg/dL — ABNORMAL HIGH (ref 65–99)
Potassium: 3.9 mmol/L (ref 3.5–5.2)

## 2015-06-02 ENCOUNTER — Other Ambulatory Visit: Payer: Self-pay | Admitting: Physician Assistant

## 2015-06-02 MED ORDER — BENZONATATE 100 MG PO CAPS: 200.0000 mg | ORAL_CAPSULE | Freq: Three times a day (TID) | ORAL | Status: DC | PRN

## 2015-06-02 MED ORDER — PREDNISONE 20 MG PO TABS
ORAL_TABLET | ORAL | Status: DC
Start: 2015-06-02 — End: 2015-07-26

## 2015-06-16 ENCOUNTER — Other Ambulatory Visit: Payer: Self-pay | Admitting: Physician Assistant

## 2015-06-16 MED ORDER — FLUCONAZOLE 150 MG PO TABS: 150.0000 mg | ORAL_TABLET | Freq: Every day | ORAL | Status: DC

## 2015-06-16 MED ORDER — LEVOFLOXACIN 500 MG PO TABS: 500.0000 mg | ORAL_TABLET | Freq: Every day | ORAL | Status: AC

## 2015-07-16 ENCOUNTER — Encounter: Payer: Self-pay | Admitting: *Deleted

## 2015-07-18 ENCOUNTER — Other Ambulatory Visit: Payer: Self-pay | Admitting: Physician Assistant

## 2015-07-19 ENCOUNTER — Telehealth: Payer: Self-pay | Admitting: Physician Assistant

## 2015-07-19 DIAGNOSIS — R590 Localized enlarged lymph nodes: Secondary | ICD-10-CM

## 2015-07-19 LAB — CBC WITH DIFFERENTIAL/PLATELET
BASOS: 0 %
Basophils Absolute: 0 10*3/uL (ref 0.0–0.2)
EOS (ABSOLUTE): 0.2 10*3/uL (ref 0.0–0.4)
Eos: 3 %
HEMATOCRIT: 32.4 % — AB (ref 34.0–46.6)
Hemoglobin: 9.9 g/dL — ABNORMAL LOW (ref 11.1–15.9)
IMMATURE GRANS (ABS): 0 10*3/uL (ref 0.0–0.1)
Immature Granulocytes: 0 %
LYMPHS ABS: 1.7 10*3/uL (ref 0.7–3.1)
Lymphs: 28 %
MCH: 22.5 pg — AB (ref 26.6–33.0)
MCHC: 30.6 g/dL — AB (ref 31.5–35.7)
MCV: 74 fL — ABNORMAL LOW (ref 79–97)
MONOS ABS: 0.5 10*3/uL (ref 0.1–0.9)
Monocytes: 8 %
NEUTROS ABS: 3.7 10*3/uL (ref 1.4–7.0)
Neutrophils: 61 %
Platelets: 415 10*3/uL — ABNORMAL HIGH (ref 150–379)
RBC: 4.4 x10E6/uL (ref 3.77–5.28)
RDW: 19.5 % — ABNORMAL HIGH (ref 12.3–15.4)
WBC: 6.1 10*3/uL (ref 3.4–10.8)

## 2015-07-19 LAB — EPSTEIN-BARR VIRUS VCA ANTIBODY PANEL
EBV Early Antigen Ab, IgG: <9 U/mL (ref 0.0–8.9)
EBV NA IGG: 155 U/mL — AB (ref 0.0–17.9)
EBV VCA IGG: 290 U/mL — AB (ref 0.0–17.9)

## 2015-07-19 LAB — HEPATIC FUNCTION PANEL
ALT: 38 IU/L — AB (ref 0–32)
AST: 34 IU/L (ref 0–40)
Albumin: 4.2 g/dL (ref 3.5–5.5)
Alkaline Phosphatase: 46 IU/L (ref 39–117)
Bilirubin Total: 0.8 mg/dL (ref 0.0–1.2)
Bilirubin, Direct: 0.22 mg/dL (ref 0.00–0.40)
TOTAL PROTEIN: 7 g/dL (ref 6.0–8.5)

## 2015-07-19 LAB — LIPID PANEL W/O CHOL/HDL RATIO
CHOLESTEROL TOTAL: 205 mg/dL — AB (ref 100–199)
HDL: 73 mg/dL (ref >39)
LDL CALC: 102 mg/dL — AB (ref 0–99)
TRIGLYCERIDES: 152 mg/dL — AB (ref 0–149)
VLDL CHOLESTEROL CAL: 30 mg/dL (ref 5–40)

## 2015-07-19 LAB — BASIC METABOLIC PANEL
BUN / CREAT RATIO: 12 (ref 8–20)
BUN: 11 mg/dL (ref 6–20)
CO2: 17 mmol/L — AB (ref 18–29)
CREATININE: 0.91 mg/dL (ref 0.57–1.00)
Calcium: 9 mg/dL (ref 8.7–10.2)
Chloride: 103 mmol/L (ref 96–106)
GFR calc Af Amer: 92 mL/min/{1.73_m2} (ref >59)
GFR, EST NON AFRICAN AMERICAN: 80 mL/min/{1.73_m2} (ref >59)
GLUCOSE: 167 mg/dL — AB (ref 65–99)
Potassium: 4.7 mmol/L (ref 3.5–5.2)
SODIUM: 141 mmol/L (ref 134–144)

## 2015-07-19 LAB — HGB A1C W/O EAG: HEMOGLOBIN A1C: 6.5 % — AB (ref 4.8–5.6)

## 2015-07-19 NOTE — Addendum Note (Signed)
Addended by: Vicie Mutters R on: 07/19/2015 03:41 PM   Modules accepted: Orders

## 2015-07-19 NOTE — Telephone Encounter (Signed)
Patient smoker with family history of lymphoma presents with right sided posterior non tender cervical lymphadenopathy, will get US neck, may get CXR and will do breast exam next OV.

## 2015-07-21 ENCOUNTER — Encounter (INDEPENDENT_AMBULATORY_CARE_PROVIDER_SITE_OTHER): Payer: Self-pay

## 2015-07-21 ENCOUNTER — Ambulatory Visit
Admission: RE | Admit: 2015-07-21 | Discharge: 2015-07-21 | Disposition: A | Discharge status: Home / Self Care | Payer: 59 | Source: Ambulatory Visit | Attending: Physician Assistant | Admitting: Physician Assistant

## 2015-07-21 DIAGNOSIS — R590 Localized enlarged lymph nodes: Secondary | ICD-10-CM

## 2015-07-21 LAB — IRON: Iron: 87 ug/dL (ref 27–159)

## 2015-07-21 LAB — FERRITIN: FERRITIN: 11 ng/mL — AB (ref 15–150)

## 2015-07-21 LAB — SPECIMEN STATUS REPORT

## 2015-07-26 ENCOUNTER — Ambulatory Visit (INDEPENDENT_AMBULATORY_CARE_PROVIDER_SITE_OTHER): Payer: 59 | Admitting: Physician Assistant

## 2015-07-26 ENCOUNTER — Encounter: Payer: Self-pay | Admitting: Physician Assistant

## 2015-07-26 VITALS — BP 122/80 | HR 97 | Temp 97.7°F | Ht 67.5 in | Wt 219.0 lb

## 2015-07-26 DIAGNOSIS — Z79899 Other long term (current) drug therapy: Secondary | ICD-10-CM | POA: Diagnosis not present

## 2015-07-26 DIAGNOSIS — E785 Hyperlipidemia, unspecified: Secondary | ICD-10-CM | POA: Diagnosis not present

## 2015-07-26 DIAGNOSIS — E669 Obesity, unspecified: Secondary | ICD-10-CM | POA: Diagnosis not present

## 2015-07-26 DIAGNOSIS — E039 Hypothyroidism, unspecified: Secondary | ICD-10-CM | POA: Diagnosis not present

## 2015-07-26 DIAGNOSIS — E559 Vitamin D deficiency, unspecified: Secondary | ICD-10-CM

## 2015-07-26 DIAGNOSIS — E119 Type 2 diabetes mellitus without complications: Secondary | ICD-10-CM | POA: Diagnosis not present

## 2015-07-26 DIAGNOSIS — D649 Anemia, unspecified: Secondary | ICD-10-CM

## 2015-07-26 MED ORDER — AZITHROMYCIN 250 MG PO TABS: ORAL_TABLET | ORAL | Status: AC

## 2015-07-26 MED ORDER — FLUCONAZOLE 150 MG PO TABS: 150.0000 mg | ORAL_TABLET | Freq: Every day | ORAL | Status: DC

## 2015-07-26 MED ORDER — PROMETHAZINE-CODEINE 6.25-10 MG/5ML PO SYRP: 5.0000 mL | ORAL_SOLUTION | Freq: Four times a day (QID) | ORAL | Status: DC | PRN

## 2015-07-26 MED ORDER — NALTREXONE-BUPROPION HCL ER 8-90 MG PO TB12: ORAL_TABLET | ORAL | Status: DC

## 2015-07-26 NOTE — Progress Notes (Signed)
Assessment and Plan:  1. Hypertension -Continue medication, monitor blood pressure at home. Continue DASH diet.  Reminder to go to the ER if any CP, SOB, nausea, dizziness, severe HA, changes vision/speech, left arm numbness and tingling and jaw pain.  2. Cholesterol -Continue diet and exercise. Check cholesterol.   3. Diabetes without complications -Continue diet and exercise. Check A1C Continue metformin and invokana, 1/2 dose  4. Vitamin D Def - check level and continue medications.   5. Obesity with co morbidities - long discussion about weight loss, diet, and exercise - will try on contrave, has failed phentermine  6. Hypothyroidism -check TSH level, continue medications the same, reminded to take on an empty stomach 30-76mins before food.   7. Patient is s/p gastric bypass - get on iron daily, will check folate/copper next OV  8. . Probable sleep apnea - obesity, crowded mouth, insomnia, get sleep study, weight loss advised.   9. URI Codeine cough syrup, has tolerated before, will avoid prednisone and given inhaler, advised to quit smoking, zpak hold on to, if not better then get on zpak 4-5 days.    Over 30 minutes of exam, counseling, chart review, and critical decision making was performed   HPI 40 y.o. white female  presents for 3 month follow up on hypertension, cholesterol, diabetes and vitamin D deficiency.   Her blood pressure has been controlled at home, today her BP is BP: 122/80 mmHg.  She does workout at this time but wants to start. She denies chest pain, shortness of breath, dizziness. She continue to smoke, has had head congestion/cough x Sunday, has tried tessalon that has not helped. Has SOB, wheezing, worse in the AM, no fever or chills.   She is not on cholesterol medication and denies myalgias. Her cholesterol is at goal. The cholesterol was:   Lab Results  Component Value Date   CHOL 205* 07/18/2015   HDL 73 07/18/2015   LDLCALC 102* 07/18/2015    TRIG 152* 07/18/2015   She has a history of seizures x age of 82 but has been stable on valproic acid 250, has not had a seizure in a decade.  She has history of gastric bypass but due to stress and poor eating habits has started to gain back her weight 4 year s/p bypass, starting weight was 260. She has iron def due to her bypass.  Lab Results  Component Value Date   WBC 6.1 07/18/2015   HGB 11.5 10/07/2014   HCT 32.4* 07/18/2015   MCV 74* 07/18/2015   PLT 415* 07/18/2015   Lab Results  Component Value Date   IRON 87 07/18/2015   TIBC 549* 05/12/2015   FERRITIN 11* 07/18/2015   She is on thyroid medication. Her medication was not changed last visit.   Lab Results  Component Value Date   TSH 1.640 04/12/2015  .   She has been working on diet and exercise for diabetes, last visit her A1C has gone from preDM to DM range without complications , she is not on bASA, she is not on ACE/ARB, has been on metformin 2 a day and she stopped invokana due to yeast infection took for one month and denies  paresthesia of the feet, polydipsia, polyuria and visual disturbances. Last A1C was:  Lab Results  Component Value Date   HGBA1C 6.5* 07/18/2015    Patient is on Vitamin D supplement. Lab Results  Component Value Date   VD25OH 42.4 04/12/2015   BMI is Body mass  index is 33.77 kg/(m^2)., she is working on diet and exercise. Wt Readings from Last 3 Encounters:  07/26/15 219 lb (99.338 kg)  04/14/15 225 lb (102.059 kg)  03/14/15 223 lb 6.4 oz (101.334 kg)    Current Medications:  Current Outpatient Prescriptions on File Prior to Visit  Medication Sig Dispense Refill  . diazepam (VALIUM) 5 MG tablet 1/2 to 1 daily as needed for severe anxiety 30 tablet 0  . esomeprazole (NEXIUM) 40 MG capsule Take 1 capsule (40 mg total) by mouth daily. 90 capsule 1  . etonogestrel-ethinyl estradiol (NUVARING) 0.12-0.015 MG/24HR vaginal ring Insert one per vagina every 21 days for a total of 4  consecutive readings. Take a three-day break and then start with one ring every 21 days for a total of 4 readings again Please note this requires further rings every 3 months 4 each 3  . fluconazole (DIFLUCAN) 150 MG tablet Take 1 tablet (150 mg total) by mouth daily. 1 tablet 3  . levothyroxine (SYNTHROID, LEVOTHROID) 300 MCG tablet Take 1 tablet (300 mcg total) by mouth daily before breakfast. 90 tablet 1  . metFORMIN (GLUCOPHAGE XR) 500 MG 24 hr tablet 2 times daily with largest meal 180 tablet 3  . omeprazole (PRILOSEC) 20 MG capsule     . phentermine (ADIPEX-P) 37.5 MG tablet Take 1 tablet (37.5 mg total) by mouth daily before breakfast. 30 tablet 2  . sucralfate (CARAFATE) 1 G tablet Take 1 tablet (1 g total) by mouth 4 (four) times daily -  with meals and at bedtime. 360 tablet 5  . traZODone (DESYREL) 50 MG tablet Take 50 mg by mouth at bedtime.    . Valproic Acid 250 MG CPDR Take 250 mg by mouth 3 (three) times daily.    . Vitamin D, Ergocalciferol, (DRISDOL) 50000 UNITS CAPS capsule 1 tablet 3 times a week 40 capsule 1   No current facility-administered medications on file prior to visit.   Medical History:  Past Medical History  Diagnosis Date  . Depression   . Headache(784.0)   . Seizures ([place])   . Anxiety   . Asthma   . History of diabetes mellitus     prior to gastric bypass in 2012  . Abdominal cramping 08/10/2011  . Obesity     BMi 33  . Hypothyroidism   . UTI (lower urinary tract infection) 08/14/2011  . Habitual abortion history, antepartum    Allergies:  Allergies  Allergen Reactions  . Codeine Itching, Rash and Other (See Comments)    OPIOIDS - MORPHINE & RELATED     Review of Systems:  Review of Systems  Constitutional: Positive for malaise/fatigue. Negative for fever, chills, weight loss and diaphoresis.  HENT: Negative.   Respiratory: Negative.   Cardiovascular: Negative.   Gastrointestinal: Positive for heartburn, nausea and abdominal pain. Negative  for vomiting, diarrhea, constipation, blood in stool and melena.  Genitourinary: Negative.   Musculoskeletal: Negative.   Skin: Negative.   Neurological: Negative.  Negative for weakness.  Psychiatric/Behavioral: Positive for depression. Negative for suicidal ideas, hallucinations, memory loss and substance abuse. The patient is nervous/anxious and has insomnia.     Family history- Review and unchanged Social history- Review and unchanged Physical Exam: BP 122/80 mmHg  Pulse 97  Temp(Src) 97.7 F (36.5 C) (Temporal)  Ht 5' 7.5" (1.715 m)  Wt 219 lb (99.338 kg)  BMI 33.77 kg/m2  SpO2 100% Wt Readings from Last 3 Encounters:  07/26/15 219 lb (99.338 kg)  04/14/15 225  lb (102.059 kg)  03/14/15 223 lb 6.4 oz (101.334 kg)   General Appearance: Well nourished, in no apparent distress. Eyes: PERRLA, EOMs, conjunctiva no swelling or erythema Sinuses: No Frontal/maxillary tenderness ENT/Mouth: Ext aud canals clear, TMs without erythema, bulging. No erythema, swelling, or exudate on post pharynx.  Tonsils not swollen or erythematous. Hearing normal. Crowded mouth Neck: Supple, thyroid normal.  Respiratory: Respiratory effort normal, + wheezing right worse than left BS equal bilaterally without rales, rhonchi, or stridor.  Cardio: RRR with no MRGs. Brisk peripheral pulses without edema.  Abdomen: Soft, normal BS, nontender, no guarding, rebound, hernias, masses. Lymphatics: Non tender without lymphadenopathy.  Musculoskeletal: Full ROM, 5/5 strength, Normal gait Skin: Warm, dry without rashes, lesions, ecchymosis.  Neuro: Cranial nerves intact. No cerebellar symptoms.  Psych: Awake and oriented X 3, normal affect, Insight and Judgment appropriate.    Vicie Mutters, PA-C 8:59 AM [facility]

## 2015-07-26 NOTE — Patient Instructions (Addendum)
Add ENTERIC COATED low dose 81 mg Aspirin daily OR can do every other day if you have easy bruising to protect your heart and head. As well as to reduce risk of Colon Cancer by 20 %, Skin Cancer by 26 % , Melanoma by 46% and Pancreatic cancer by 60%  Diabetes is a very complicated disease...lets simplify it.  An easy way to look at it to understand the complications is if you think of the extra sugar floating in your blood stream as glass shards floating through your blood stream.    Diabetes affects your small vessels first: 1) The glass shards (sugar) scraps down the tiny blood vessels in your eyes and lead to diabetic retinopathy, the leading cause of blindness in the [place]. Diabetes is the leading cause of newly diagnosed adult (50 to 40 years of age) blindness in the [place].  2) The glass shards scratches down the tiny vessels of your legs leading to nerve damage called neuropathy and can lead to amputations of your feet. More than 60% of all non-traumatic amputations of lower limbs occur in people with diabetes.  3) Over time the small vessels in your brain are shredded and closed off, individually this does not cause any problems but over a long period of time many of the small vessels being blocked can lead to Vascular Dementia.   4) Your kidney's are a filter system and have a "net" that keeps certain things in the body and lets bad things out. Sugar shreds this net and leads to kidney damage and eventually failure. Decreasing the sugar that is destroying the net and certain blood pressure medications can help stop or decrease progression of kidney disease. Diabetes was the primary cause of kidney failure in 44 percent of all new cases in 2011.  5) Diabetes also destroys the small vessels in your penis that lead to erectile dysfunction. Eventually the vessels are so damaged that you may not be responsive to cialis or viagra.   Diabetes and your large vessels: Your larger vessels  consist of your coronary arteries in your heart and the carotid vessels to your brain. Diabetes or even increased sugars put you at 300% increased risk of heart attack and stroke and this is why.. The sugar scrapes down your large blood vessels and your body sees this as an internal injury and tries to repair itself. Just like you get a scab on your skin, your platelets will stick to the blood vessel wall trying to heal it. This is why we have diabetics on low dose aspirin daily, this prevents the platelets from sticking and can prevent plaque formation. In addition, your body takes cholesterol and tries to shove it into the open wound. This is why we want your LDL, or bad cholesterol, below 70.   The combination of platelets and cholesterol over 5-10 years forms plaque that can break off and cause a heart attack or stroke.   PLEASE REMEMBER:  Diabetes is preventable! Up to 36 percent of complications and morbidities among individuals with type 2 diabetes can be prevented, delayed, or effectively treated and minimized with regular visits to a health professional, appropriate monitoring and medication, and a healthy diet and lifestyle.  I will give you a prescription for an antibiotic, but please only take it if you are not feeling better in 7-10 days.  Bronchitis is mostly caused by viruses and the antibiotic will do nothing.  PLEASE TRY TO DO OVER THE COUNTER TREATMENT AND/OR PREDNISONE  FOR 5-7 DAYS AND IF YOU ARE NOT GETTING BETTER OR GETTING WORSE THEN YOU CAN START ON AN ANTIBIOTIC GIVEN.  Can take the prednisone AT NIGHT WITH DINNER, it take 8-12 hours to start working so it will NOT affect your sleeping if you take it at night with your food!! Take two pills the first night and 1 or two pill the second night and then 1 pill the other nights.    Rest and stay hydrated.  Make sure you drink plenty of fluids to make sure urine is clear when you urinate.  Water will help thin out mucous. - Take  Mucinex DM- Maximum Strength over the counter to thin out and cough up the thick mucous.  Please follow directions on box. -Take Albuterol if prescribed.  Risk of antibiotic use: About 1 in 4 people who take antibiotics have side effects including stomach problems, dizziness, or rashes. Those problems clear up soon after stopping the drugs, but in rare cases antibiotics can cause severe allergic reaction. Over use of antibiotics also encourages the growth of bacteria that can't be controlled easily with drugs. That makes you more vunerable to antibiotic-resistant infections and undermines the benefits of antibiotics for others.   Waste of Money: Antibiotics often aren't very expensive, but any money spent on unnecessary drugs is money down the drain.   When are antibiotics needed? Only when symptoms last longer than a week.  Start to improve but then worsen again  Please call the office or message through My Chart if you have any questions.  Can do samples steroid inhaler if do not tolerate oral steroids, do 1 puff twice a day and wash mouth out afterwards to avoid yeast.   Acute Bronchitis Bronchitis is when the airways that extend from the windpipe into the lungs get red, puffy, and painful (inflamed). Bronchitis often causes thick spit (mucus) to develop. This leads to a cough. A cough is the most common symptom of bronchitis. In acute bronchitis, the condition usually begins suddenly and goes away over time (usually in 2 weeks). Smoking, allergies, and asthma can make bronchitis worse. Repeated episodes of bronchitis may cause more lung problems.  Most common cause of Bronchitis is viruses (rhinovirus, coronavirus, RSV).  Therefore, not requiring an antibiotic; as antibiotics only treat bacterial infections.  HOME CARE  Rest.  Drink enough fluids to keep your pee (urine) clear or pale yellow (unless you need to limit fluids as told by your doctor).  Only take over-the-counter or  prescription medicines as told by your doctor.  Avoid smoking and secondhand smoke. These can make bronchitis worse. If you are a smoker, think about using nicotine gum or skin patches. Quitting smoking will help your lungs heal faster.  Reduce the chance of getting bronchitis again by:  Washing your hands often.  Avoiding people with cold symptoms.  Trying not to touch your hands to your mouth, nose, or eyes.  Follow up with your doctor as told. GET HELP IF: Your symptoms do not improve after 1 week of treatment. Symptoms include:  Cough.  Fever.  Coughing up thick spit.  Body aches.  Chest congestion.  Chills.  Shortness of breath.  Sore throat. GET HELP RIGHT AWAY IF:   You have an increased fever.  You have chills.  You have severe shortness of breath.  You have bloody thick spit (sputum).  You throw up (vomit) often.  You lose too much body fluid (dehydration).  You have a severe headache.  You faint. MAKE SURE YOU:   Understand these instructions.  Will watch your condition.  Will get help right away if you are not doing well or get worse. Document Released: 11/28/2007 Document Revised: 02/11/2013 Document Reviewed: 12/02/2012 [facility] Patient Information 2015 [place], [place]. This information is not intended to replace advice given to you by your health care provider. Make sure you discuss any questions you have with your health care provider.

## 2015-08-31 ENCOUNTER — Other Ambulatory Visit: Payer: Self-pay | Admitting: Physician Assistant

## 2015-10-25 ENCOUNTER — Ambulatory Visit: Payer: Self-pay | Admitting: Physician Assistant

## 2015-11-13 ENCOUNTER — Encounter: Payer: Self-pay | Admitting: *Deleted

## 2015-11-23 ENCOUNTER — Ambulatory Visit (INDEPENDENT_AMBULATORY_CARE_PROVIDER_SITE_OTHER): Payer: 59 | Admitting: Physician Assistant

## 2015-11-23 ENCOUNTER — Encounter: Payer: Self-pay | Admitting: Physician Assistant

## 2015-11-23 VITALS — BP 128/74 | HR 103 | Temp 97.7°F | Resp 16 | Ht 67.5 in | Wt 205.6 lb

## 2015-11-23 DIAGNOSIS — E039 Hypothyroidism, unspecified: Secondary | ICD-10-CM

## 2015-11-23 DIAGNOSIS — E785 Hyperlipidemia, unspecified: Secondary | ICD-10-CM | POA: Diagnosis not present

## 2015-11-23 DIAGNOSIS — Z79899 Other long term (current) drug therapy: Secondary | ICD-10-CM

## 2015-11-23 DIAGNOSIS — E119 Type 2 diabetes mellitus without complications: Secondary | ICD-10-CM | POA: Diagnosis not present

## 2015-11-23 DIAGNOSIS — D649 Anemia, unspecified: Secondary | ICD-10-CM

## 2015-11-23 DIAGNOSIS — E559 Vitamin D deficiency, unspecified: Secondary | ICD-10-CM

## 2015-11-23 DIAGNOSIS — E669 Obesity, unspecified: Secondary | ICD-10-CM

## 2015-11-23 MED ORDER — PHENTERMINE HCL 37.5 MG PO TABS: 37.5000 mg | ORAL_TABLET | Freq: Every day | ORAL | Status: DC

## 2015-11-23 NOTE — Progress Notes (Signed)
Assessment and Plan:  1. Hypertension -Continue medication, monitor blood pressure at home. Continue DASH diet.  Reminder to go to the ER if any CP, SOB, nausea, dizziness, severe HA, changes vision/speech, left arm numbness and tingling and jaw pain.  2. Cholesterol -Continue diet and exercise. Check cholesterol.   3. Diabetes without complications -Continue diet and exercise. Check A1C Continue weight loss  4. Vitamin D Def - check level and continue medications.   5. Obesity with co morbidities - long discussion about weight loss, diet, and exercise - will try on contrave, has failed phentermine  6. Hypothyroidism -check TSH level, continue medications the same, reminded to take on an empty stomach 30-81mins before food.   7. Patient is s/p gastric bypass - get on iron daily, will check folate/copper next OV   Over 30 minutes of exam, counseling, chart review, and critical decision making was performed   HPI 40 y.o. white female  presents for 3 month follow up on hypertension, cholesterol, diabetes and vitamin D deficiency.   Her blood pressure has been controlled at home, today her BP is BP: 128/74 mmHg.  She does workout, walking daily.  She denies chest pain, shortness of breath, dizziness. She continue to smoke.   She is not on cholesterol medication and denies myalgias. Her cholesterol is at goal. The cholesterol was:   Lab Results  Component Value Date   CHOL 205* 07/18/2015   HDL 73 07/18/2015   LDLCALC 102* 07/18/2015   TRIG 152* 07/18/2015   She has a history of seizures x age of 40 but has been stable on valproic acid 250, has not had a seizure in a decade.  She has history of gastric bypass but due to stress and poor eating habits has started to gain back her weight 4 year s/p bypass, starting weight was 260. She has iron def due to her bypass.  Lab Results  Component Value Date   WBC 6.1 07/18/2015   HGB 11.5 10/07/2014   HCT 32.4* 07/18/2015   MCV 74*  07/18/2015   PLT 415* 07/18/2015   Lab Results  Component Value Date   IRON 87 07/18/2015   TIBC 549* 05/12/2015   FERRITIN 11* 07/18/2015   She is on thyroid medication. Her medication was not changed last visit.   Lab Results  Component Value Date   TSH 1.640 04/12/2015  .   She has been working on diet and exercise for diabetes, last visit her A1C has gone from preDM to DM range without complications , she is not on bASA, she is not on ACE/ARB, has NOT been on metformin 2 a day and she stopped invokana due to yeast infection took for one month and denies  paresthesia of the feet, polydipsia, polyuria and visual disturbances. Last A1C was:  Lab Results  Component Value Date   HGBA1C 6.5* 07/18/2015    Patient is on Vitamin D supplement. Lab Results  Component Value Date   VD25OH 42.4 04/12/2015   BMI is Body mass index is 31.71 kg/(m^2)., she is working on diet and exercise, she is down 20lbs with contrave. Wt Readings from Last 3 Encounters:  11/23/15 205 lb 9.6 oz (93.26 kg)  07/26/15 219 lb (99.338 kg)  04/14/15 225 lb (102.059 kg)    Current Medications:    Medication List       This list is accurate as of: 11/23/15  4:31 PM.  Always use your most recent med list.  diazepam 5 MG tablet  Commonly known as:  VALIUM  1/2 to 1 daily as needed for severe anxiety     esomeprazole 40 MG capsule  Commonly known as:  NEXIUM  Take 1 capsule by mouth  daily     etonogestrel-ethinyl estradiol 0.12-0.015 MG/24HR vaginal ring  Commonly known as:  NUVARING  Insert one per vagina every 21 days for a total of 4 consecutive readings. Take a three-day break and then start with one ring every 21 days for a total of 4 readings again Please note this requires further rings every 3 months     levothyroxine 300 MCG tablet  Commonly known as:  SYNTHROID, LEVOTHROID  Take 1 tablet (300 mcg total) by mouth daily before breakfast.     metFORMIN 500 MG 24 hr tablet   Commonly known as:  GLUCOPHAGE XR  2 times daily with largest meal     Naltrexone-Bupropion HCl ER 8-90 MG Tb12  Commonly known as:  CONTRAVE  1 pill a morning for 1 week, then 1 pill twice a day for 1 week, 2 pills in the morning and one at night for 1 week, then 2 pill twice a day for 1 week and stay on this dose.     omeprazole 20 MG capsule  Commonly known as:  PRILOSEC     phentermine 37.5 MG tablet  Commonly known as:  ADIPEX-P  Take 1 tablet (37.5 mg total) by mouth daily before breakfast.     sucralfate 1 g tablet  Commonly known as:  CARAFATE  Take 1 tablet (1 g total) by mouth 4 (four) times daily -  with meals and at bedtime.     traZODone 50 MG tablet  Commonly known as:  DESYREL  Take 50 mg by mouth at bedtime.     Valproic Acid 250 MG Cpdr  Take 250 mg by mouth 3 (three) times daily.     Vitamin D (Ergocalciferol) 50000 units Caps capsule  Commonly known as:  DRISDOL  1 tablet 3 times a week        Medical History:  Past Medical History  Diagnosis Date  . Depression   . Headache(784.0)   . Seizures ([place])   . Anxiety   . Asthma   . History of diabetes mellitus     prior to gastric bypass in 2012  . Abdominal cramping 08/10/2011  . Obesity     BMi 33  . Hypothyroidism   . UTI (lower urinary tract infection) 08/14/2011  . Habitual abortion history, antepartum    Allergies:  Allergies  Allergen Reactions  . Codeine Itching, Rash and Other (See Comments)    OPIOIDS - MORPHINE & RELATED     Review of Systems:  Review of Systems  Constitutional: Positive for malaise/fatigue. Negative for fever, chills, weight loss and diaphoresis.  HENT: Negative.   Respiratory: Negative.   Cardiovascular: Negative.   Gastrointestinal: Positive for heartburn, nausea and abdominal pain. Negative for vomiting, diarrhea, constipation, blood in stool and melena.  Genitourinary: Negative.   Musculoskeletal: Negative.   Skin: Negative.   Neurological: Negative.   Negative for weakness.  Psychiatric/Behavioral: Positive for depression. Negative for suicidal ideas, hallucinations, memory loss and substance abuse. The patient is nervous/anxious and has insomnia.     Family history- Review and unchanged Social history- Review and unchanged Physical Exam: BP 128/74 mmHg  Pulse 103  Temp(Src) 97.7 F (36.5 C) (Temporal)  Resp 16  Ht 5' 7.5" (1.715 m)  Wt  205 lb 9.6 oz (93.26 kg)  BMI 31.71 kg/m2  SpO2 96%  LMP 10/04/2015 Wt Readings from Last 3 Encounters:  11/23/15 205 lb 9.6 oz (93.26 kg)  07/26/15 219 lb (99.338 kg)  04/14/15 225 lb (102.059 kg)   General Appearance: Well nourished, in no apparent distress. Eyes: PERRLA, EOMs, conjunctiva no swelling or erythema Sinuses: No Frontal/maxillary tenderness ENT/Mouth: Ext aud canals clear, TMs without erythema, bulging. No erythema, swelling, or exudate on post pharynx.  Tonsils not swollen or erythematous. Hearing normal.  Neck: Supple, thyroid normal.  Respiratory: Respiratory effort normal,  Without wheezing, rales, rhonchi, or stridor.  Cardio: RRR with no MRGs. Brisk peripheral pulses without edema.  Abdomen: Soft, normal BS, nontender, no guarding, rebound, hernias, masses. Lymphatics: Non tender without lymphadenopathy.  Musculoskeletal: Full ROM, 5/5 strength, Normal gait Skin: Warm, dry without rashes, lesions, ecchymosis.  Neuro: Cranial nerves intact. No cerebellar symptoms.  Psych: Awake and oriented X 3, normal affect, Insight and Judgment appropriate.    Vicie Mutters, PA-C 4:28 PM [facility]

## 2015-11-23 NOTE — Patient Instructions (Signed)
Lateral Epicondylitis With Rehab Lateral epicondylitis involves inflammation and pain around the outer portion of the elbow. The pain is caused by inflammation of the tendons in the forearm that bring back (extend) the wrist. Lateral epicondylitis is also called tennis elbow, because it is very common in tennis players. However, it may occur in any individual who extends the wrist repetitively. If lateral epicondylitis is left untreated, it may become a chronic problem. SYMPTOMS   Pain, tenderness, and inflammation on the outer (lateral) side of the elbow.  Pain or weakness with gripping activities.  Pain that increases with wrist-twisting motions (playing tennis, using a screwdriver, opening a door or a jar).  Pain with lifting objects, including a coffee cup. CAUSES  Lateral epicondylitis is caused by inflammation of the tendons that extend the wrist. Causes of injury may include:  Repetitive stress and strain on the muscles and tendons that extend the wrist.  Sudden change in activity level or intensity.  Incorrect grip in racquet sports.  Incorrect grip size of racquet (often too large).  Incorrect hitting position or technique (usually backhand, leading with the elbow).  Using a racket that is too heavy. RISK INCREASES WITH:  Sports or occupations that require repetitive and/or strenuous forearm and wrist movements (tennis, squash, racquetball, carpentry).  Poor wrist and forearm strength and flexibility.  Failure to warm up properly before activity.  Resuming activity before healing, rehabilitation, and conditioning are complete. PREVENTION   Warm up and stretch properly before activity.  Maintain physical fitness:  Strength, flexibility, and endurance.  Cardiovascular fitness.  Wear and use properly fitted equipment.  Learn and use proper technique and have a coach correct improper technique.  Wear a tennis elbow (counterforce) brace. PROGNOSIS  The course of  this condition depends on the degree of the injury. If treated properly, acute cases (symptoms lasting less than 4 weeks) are often resolved in 2 to 6 weeks. Chronic (longer lasting cases) often resolve in 3 to 6 months but may require physical therapy. RELATED COMPLICATIONS   Frequently recurring symptoms, resulting in a chronic problem. Properly treating the problem the first time decreases frequency of recurrence.  Chronic inflammation, scarring tendon degeneration, and partial tendon tear, requiring surgery.  Delayed healing or resolution of symptoms. TREATMENT  Treatment first involves the use of ice and medicine to reduce pain and inflammation. Strengthening and stretching exercises may help reduce discomfort if performed regularly. These exercises may be performed at home if the condition is an acute injury. Chronic cases may require a referral to a physical therapist for evaluation and treatment. Your caregiver may advise a corticosteroid injection to help reduce inflammation. Rarely, surgery is needed. MEDICATION  If pain medicine is needed, nonsteroidal anti-inflammatory medicines (aspirin and ibuprofen), or other minor pain relievers (acetaminophen), are often advised.  Do not take pain medicine for 7 days before surgery.  Prescription pain relievers may be given, if your caregiver thinks they are needed. Use only as directed and only as much as you need.  Corticosteroid injections may be recommended. These injections should be reserved only for the most severe cases, because they can only be given a certain number of times. HEAT AND COLD  Cold treatment (icing) should be applied for 10 to 15 minutes every 2 to 3 hours for inflammation and pain, and immediately after activity that aggravates your symptoms. Use ice packs or an ice massage.  Heat treatment may be used before performing stretching and strengthening activities prescribed by your caregiver, physical therapist,   or  athletic trainer. Use a heat pack or a warm water soak. SEEK MEDICAL CARE IF: Symptoms get worse or do not improve in 2 weeks, despite treatment. EXERCISES  RANGE OF MOTION (ROM) AND STRETCHING EXERCISES - Epicondylitis, Lateral (Tennis Elbow) These exercises may help you when beginning to rehabilitate your injury. Your symptoms may go away with or without further involvement from your physician, physical therapist, or athletic trainer. While completing these exercises, remember:   Restoring tissue flexibility helps normal motion to return to the joints. This allows healthier, less painful movement and activity.  An effective stretch should be held for at least 30 seconds.  A stretch should never be painful. You should only feel a gentle lengthening or release in the stretched tissue. RANGE OF MOTION - Wrist Flexion, Active-Assisted  Extend your right / left elbow with your fingers pointing down.*  Gently pull the back of your hand towards you, until you feel a gentle stretch on the top of your forearm.  Hold this position for __________ seconds. Repeat __________ times. Complete this exercise __________ times per day.  *If directed by your physician, physical therapist or athletic trainer, complete this stretch with your elbow bent, rather than extended. RANGE OF MOTION - Wrist Extension, Active-Assisted  Extend your right / left elbow and turn your palm upwards.*  Gently pull your palm and fingertips back, so your wrist extends and your fingers point more toward the ground.  You should feel a gentle stretch on the inside of your forearm.  Hold this position for __________ seconds. Repeat __________ times. Complete this exercise __________ times per day. *If directed by your physician, physical therapist or athletic trainer, complete this stretch with your elbow bent, rather than extended. STRETCH - Wrist Flexion  Place the back of your right / left hand on a tabletop, leaving your  elbow slightly bent. Your fingers should point away from your body.  Gently press the back of your hand down onto the table by straightening your elbow. You should feel a stretch on the top of your forearm.  Hold this position for __________ seconds. Repeat __________ times. Complete this stretch __________ times per day.  STRETCH - Wrist Extension   Place your right / left fingertips on a tabletop, leaving your elbow slightly bent. Your fingers should point backwards.  Gently press your fingers and palm down onto the table by straightening your elbow. You should feel a stretch on the inside of your forearm.  Hold this position for __________ seconds. Repeat __________ times. Complete this stretch __________ times per day.  STRENGTHENING EXERCISES - Epicondylitis, Lateral (Tennis Elbow) These exercises may help you when beginning to rehabilitate your injury. They may resolve your symptoms with or without further involvement from your physician, physical therapist, or athletic trainer. While completing these exercises, remember:   Muscles can gain both the endurance and the strength needed for everyday activities through controlled exercises.  Complete these exercises as instructed by your physician, physical therapist or athletic trainer. Increase the resistance and repetitions only as guided.  You may experience muscle soreness or fatigue, but the pain or discomfort you are trying to eliminate should never worsen during these exercises. If this pain does get worse, stop and make sure you are following the directions exactly. If the pain is still present after adjustments, discontinue the exercise until you can discuss the trouble with your caregiver. STRENGTH - Wrist Flexors  Sit with your right / left forearm palm-up and fully supported   on a table or countertop. Your elbow should be resting below the height of your shoulder. Allow your wrist to extend over the edge of the  surface.  Loosely holding a __________ weight, or a piece of rubber exercise band or tubing, slowly curl your hand up toward your forearm.  Hold this position for __________ seconds. Slowly lower the wrist back to the starting position in a controlled manner. Repeat __________ times. Complete this exercise __________ times per day.  STRENGTH - Wrist Extensors  Sit with your right / left forearm palm-down and fully supported on a table or countertop. Your elbow should be resting below the height of your shoulder. Allow your wrist to extend over the edge of the surface.  Loosely holding a __________ weight, or a piece of rubber exercise band or tubing, slowly curl your hand up toward your forearm.  Hold this position for __________ seconds. Slowly lower the wrist back to the starting position in a controlled manner. Repeat __________ times. Complete this exercise __________ times per day.  STRENGTH - Ulnar Deviators  Stand with a ____________________ weight in your right / left hand, or sit while holding a rubber exercise band or tubing, with your healthy arm supported on a table or countertop.  Move your wrist, so that your pinkie travels toward your forearm and your thumb moves away from your forearm.  Hold this position for __________ seconds and then slowly lower the wrist back to the starting position. Repeat __________ times. Complete this exercise __________ times per day STRENGTH - Radial Deviators  Stand with a ____________________ weight in your right / left hand, or sit while holding a rubber exercise band or tubing, with your injured arm supported on a table or countertop.  Raise your hand upward in front of you or pull up on the rubber tubing.  Hold this position for __________ seconds and then slowly lower the wrist back to the starting position. Repeat __________ times. Complete this exercise __________ times per day. STRENGTH - Forearm Supinators   Sit with your right /  left forearm supported on a table, keeping your elbow below shoulder height. Rest your hand over the edge, palm down.  Gently grip a hammer or a soup ladle.  Without moving your elbow, slowly turn your palm and hand upward to a "thumbs-up" position.  Hold this position for __________ seconds. Slowly return to the starting position. Repeat __________ times. Complete this exercise __________ times per day.  STRENGTH - Forearm Pronators   Sit with your right / left forearm supported on a table, keeping your elbow below shoulder height. Rest your hand over the edge, palm up.  Gently grip a hammer or a soup ladle.  Without moving your elbow, slowly turn your palm and hand upward to a "thumbs-up" position.  Hold this position for __________ seconds. Slowly return to the starting position. Repeat __________ times. Complete this exercise __________ times per day.  STRENGTH - Grip  Grasp a tennis ball, a dense sponge, or a large, rolled sock in your hand.  Squeeze as hard as you can, without increasing any pain.  Hold this position for __________ seconds. Release your grip slowly. Repeat __________ times. Complete this exercise __________ times per day.  STRENGTH - Elbow Extensors, Isometric  Stand or sit upright, on a firm surface. Place your right / left arm so that your palm faces your stomach, and it is at the height of your waist.  Place your opposite hand on the underside   of your forearm. Gently push up as your right / left arm resists. Push as hard as you can with both arms, without causing any pain or movement at your right / left elbow. Hold this stationary position for __________ seconds. Gradually release the tension in both arms. Allow your muscles to relax completely before repeating.   This information is not intended to replace advice given to you by your health care provider. Make sure you discuss any questions you have with your health care provider.   Document Released:  06/11/2005 Document Revised: 07/02/2014 Document Reviewed: 09/23/2008 Elsevier Interactive Patient Education 2016 [employer]  

## 2015-11-24 ENCOUNTER — Other Ambulatory Visit: Payer: Self-pay | Admitting: Physician Assistant

## 2015-11-26 LAB — CBC WITH DIFFERENTIAL/PLATELET
BASOS: 1 %
Basophils Absolute: 0 10*3/uL (ref 0.0–0.2)
EOS (ABSOLUTE): 0.2 10*3/uL (ref 0.0–0.4)
EOS: 4 %
HEMATOCRIT: 31.9 % — AB (ref 34.0–46.6)
Hemoglobin: 9.7 g/dL — ABNORMAL LOW (ref 11.1–15.9)
IMMATURE GRANULOCYTES: 0 %
Immature Grans (Abs): 0 10*3/uL (ref 0.0–0.1)
LYMPHS ABS: 2.2 10*3/uL (ref 0.7–3.1)
Lymphs: 38 %
MCH: 24 pg — ABNORMAL LOW (ref 26.6–33.0)
MCHC: 30.4 g/dL — ABNORMAL LOW (ref 31.5–35.7)
MCV: 79 fL (ref 79–97)
MONOS ABS: 0.4 10*3/uL (ref 0.1–0.9)
Monocytes: 7 %
Neutrophils Absolute: 2.9 10*3/uL (ref 1.4–7.0)
Neutrophils: 50 %
Platelets: 347 10*3/uL (ref 150–379)
RBC: 4.04 x10E6/uL (ref 3.77–5.28)
RDW: 18.5 % — AB (ref 12.3–15.4)
WBC: 5.7 10*3/uL (ref 3.4–10.8)

## 2015-11-26 LAB — BASIC METABOLIC PANEL
BUN/Creatinine Ratio: 14 (ref 9–23)
BUN: 12 mg/dL (ref 6–20)
CALCIUM: 9 mg/dL (ref 8.7–10.2)
CO2: 18 mmol/L (ref 18–29)
Chloride: 103 mmol/L (ref 96–106)
Creatinine, Ser: 0.84 mg/dL (ref 0.57–1.00)
GFR, EST AFRICAN AMERICAN: 101 mL/min/{1.73_m2} (ref >59)
GFR, EST NON AFRICAN AMERICAN: 88 mL/min/{1.73_m2} (ref >59)
Glucose: 169 mg/dL — ABNORMAL HIGH (ref 65–99)
POTASSIUM: 4.6 mmol/L (ref 3.5–5.2)
Sodium: 139 mmol/L (ref 134–144)

## 2015-11-26 LAB — FOLATE RBC
Folate, Hemolysate: 539.3 ng/mL
Folate, RBC: 1691 ng/mL (ref >498)

## 2015-11-26 LAB — HEPATIC FUNCTION PANEL
ALT: 35 IU/L — ABNORMAL HIGH (ref 0–32)
AST: 26 IU/L (ref 0–40)
Albumin: 3.9 g/dL (ref 3.5–5.5)
Alkaline Phosphatase: 34 IU/L — ABNORMAL LOW (ref 39–117)
BILIRUBIN TOTAL: 0.4 mg/dL (ref 0.0–1.2)
BILIRUBIN, DIRECT: 0.13 mg/dL (ref 0.00–0.40)
TOTAL PROTEIN: 6.6 g/dL (ref 6.0–8.5)

## 2015-11-26 LAB — LIPID PANEL W/O CHOL/HDL RATIO
Cholesterol, Total: 202 mg/dL — ABNORMAL HIGH (ref 100–199)
HDL: 65 mg/dL (ref >39)
LDL CALC: 97 mg/dL (ref 0–99)
Triglycerides: 202 mg/dL — ABNORMAL HIGH (ref 0–149)
VLDL CHOLESTEROL CAL: 40 mg/dL (ref 5–40)

## 2015-11-26 LAB — TSH: TSH: 18.83 u[IU]/mL — AB (ref 0.450–4.500)

## 2015-11-26 LAB — COPPER, SERUM: Copper: 185 ug/dL — ABNORMAL HIGH (ref 72–166)

## 2015-11-26 LAB — HGB A1C W/O EAG: Hgb A1c MFr Bld: 6.2 % — ABNORMAL HIGH (ref 4.8–5.6)

## 2015-11-26 LAB — FERRITIN: Ferritin: 6 ng/mL — ABNORMAL LOW (ref 15–150)

## 2015-11-26 LAB — VITAMIN D 25 HYDROXY (VIT D DEFICIENCY, FRACTURES): VIT D 25 HYDROXY: 36.5 ng/mL (ref 30.0–100.0)

## 2015-11-26 LAB — IRON: IRON: 32 ug/dL (ref 27–159)

## 2016-01-18 ENCOUNTER — Other Ambulatory Visit: Payer: Self-pay | Admitting: Physician Assistant

## 2016-01-19 LAB — HGB A1C W/O EAG: HEMOGLOBIN A1C: 5.7 % — AB (ref 4.8–5.6)

## 2016-01-19 LAB — LIPID PANEL W/O CHOL/HDL RATIO
CHOLESTEROL TOTAL: 225 mg/dL — AB (ref 100–199)
HDL: 71 mg/dL (ref >39)
LDL Calculated: 127 mg/dL — ABNORMAL HIGH (ref 0–99)
TRIGLYCERIDES: 134 mg/dL (ref 0–149)
VLDL Cholesterol Cal: 27 mg/dL (ref 5–40)

## 2016-01-19 LAB — CBC WITH DIFFERENTIAL/PLATELET
BASOS ABS: 0.1 10*3/uL (ref 0.0–0.2)
Basos: 1 %
EOS (ABSOLUTE): 0.3 10*3/uL (ref 0.0–0.4)
EOS: 5 %
HEMATOCRIT: 33.2 % — AB (ref 34.0–46.6)
Hemoglobin: 10.2 g/dL — ABNORMAL LOW (ref 11.1–15.9)
IMMATURE GRANULOCYTES: 0 %
Immature Grans (Abs): 0 10*3/uL (ref 0.0–0.1)
LYMPHS ABS: 1.9 10*3/uL (ref 0.7–3.1)
Lymphs: 34 %
MCH: 24.6 pg — ABNORMAL LOW (ref 26.6–33.0)
MCHC: 30.7 g/dL — AB (ref 31.5–35.7)
MCV: 80 fL (ref 79–97)
MONOCYTES: 8 %
MONOS ABS: 0.5 10*3/uL (ref 0.1–0.9)
NEUTROS ABS: 3 10*3/uL (ref 1.4–7.0)
Neutrophils: 52 %
Platelets: 387 10*3/uL — ABNORMAL HIGH (ref 150–379)
RBC: 4.14 x10E6/uL (ref 3.77–5.28)
RDW: 17.1 % — AB (ref 12.3–15.4)
WBC: 5.8 10*3/uL (ref 3.4–10.8)

## 2016-01-19 LAB — BASIC METABOLIC PANEL
BUN/Creatinine Ratio: 14 (ref 9–23)
BUN: 11 mg/dL (ref 6–20)
CALCIUM: 9.3 mg/dL (ref 8.7–10.2)
CHLORIDE: 99 mmol/L (ref 96–106)
CO2: 20 mmol/L (ref 18–29)
Creatinine, Ser: 0.77 mg/dL (ref 0.57–1.00)
GFR calc Af Amer: 112 mL/min/{1.73_m2} (ref >59)
GFR, EST NON AFRICAN AMERICAN: 98 mL/min/{1.73_m2} (ref >59)
GLUCOSE: 142 mg/dL — AB (ref 65–99)
POTASSIUM: 4.2 mmol/L (ref 3.5–5.2)
SODIUM: 139 mmol/L (ref 134–144)

## 2016-01-19 LAB — HEPATIC FUNCTION PANEL
ALK PHOS: 48 IU/L (ref 39–117)
ALT: 55 IU/L — AB (ref 0–32)
AST: 61 IU/L — AB (ref 0–40)
Albumin: 4.2 g/dL (ref 3.5–5.5)
BILIRUBIN, DIRECT: 0.12 mg/dL (ref 0.00–0.40)
Bilirubin Total: 0.4 mg/dL (ref 0.0–1.2)
Total Protein: 6.8 g/dL (ref 6.0–8.5)

## 2016-01-19 LAB — VITAMIN D 25 HYDROXY (VIT D DEFICIENCY, FRACTURES): Vit D, 25-Hydroxy: 37 ng/mL (ref 30.0–100.0)

## 2016-01-19 LAB — TSH: TSH: 4.88 u[IU]/mL — AB (ref 0.450–4.500)

## 2016-01-24 ENCOUNTER — Encounter: Payer: Self-pay | Admitting: Physician Assistant

## 2016-01-24 ENCOUNTER — Ambulatory Visit (INDEPENDENT_AMBULATORY_CARE_PROVIDER_SITE_OTHER): Payer: 59 | Admitting: Physician Assistant

## 2016-01-24 VITALS — BP 126/64 | HR 105 | Temp 97.5°F | Resp 16 | Ht 67.5 in | Wt 209.6 lb

## 2016-01-24 DIAGNOSIS — E119 Type 2 diabetes mellitus without complications: Secondary | ICD-10-CM

## 2016-01-24 DIAGNOSIS — E785 Hyperlipidemia, unspecified: Secondary | ICD-10-CM | POA: Diagnosis not present

## 2016-01-24 DIAGNOSIS — Z79899 Other long term (current) drug therapy: Secondary | ICD-10-CM | POA: Insufficient documentation

## 2016-01-24 DIAGNOSIS — D649 Anemia, unspecified: Secondary | ICD-10-CM

## 2016-01-24 DIAGNOSIS — E039 Hypothyroidism, unspecified: Secondary | ICD-10-CM

## 2016-01-24 DIAGNOSIS — E559 Vitamin D deficiency, unspecified: Secondary | ICD-10-CM | POA: Diagnosis not present

## 2016-01-24 NOTE — Patient Instructions (Addendum)
Can get synthroid direct is supported by [facility] [employer] in [place], [place]. It is a way to get BRAND NAME synthroid sent directly to your house. I will send a prescription to a mail order pharmacy, you need to call the pharmacy at [phone] or go online to [email or web address] to get started.   Cost:  Pay 25$ for a one month supply Pay 45$ for a two month supply Pay 65$ for a 3 month supply and save 10$   Magnesium low add 400 mg with food to prevent diarrhea. Magnesium may help with muscle cramps, constipation, vitamin D and potassium absorption.

## 2016-01-24 NOTE — Progress Notes (Signed)
Subjective:    Patient ID: Lisa [name], female    DOB: 1975/12/17, 40 y.o.   MRN: [id number]  HPI 40 y.o. WF presents for fatigue and muscle spasm/cramps for past month.  Her anemia was better however we did not check a magnesium on her. States that her LFts were elevated likely due to alcohol that weekend before, denies nausea, AB pain, etc.  She is not sleeping well.   Blood pressure 126/64, pulse (!) 105, temperature 97.5 F (36.4 C), temperature source Temporal, resp. rate 16, height 5' 7.5" (1.715 m), weight 209 lb 9.6 oz (95.1 kg), last menstrual period 01/13/2016, SpO2 97 %.  Current Outpatient Prescriptions on File Prior to Visit  Medication Sig  . diazepam (VALIUM) 5 MG tablet 1/2 to 1 daily as needed for severe anxiety  . esomeprazole (NEXIUM) 40 MG capsule Take 1 capsule by mouth  daily  . etonogestrel-ethinyl estradiol (NUVARING) 0.12-0.015 MG/24HR vaginal ring Insert one per vagina every 21 days for a total of 4 consecutive readings. Take a three-day break and then start with one ring every 21 days for a total of 4 readings again Please note this requires further rings every 3 months  . levothyroxine (SYNTHROID, LEVOTHROID) 300 MCG tablet Take 1 tablet (300 mcg total) by mouth daily before breakfast.  . metFORMIN (GLUCOPHAGE XR) 500 MG 24 hr tablet 2 times daily with largest meal  . Naltrexone-Bupropion HCl ER (CONTRAVE) 8-90 MG TB12 1 pill a morning for 1 week, then 1 pill twice a day for 1 week, 2 pills in the morning and one at night for 1 week, then 2 pill twice a day for 1 week and stay on this dose.  Marland Kitchen omeprazole (PRILOSEC) 20 MG capsule   . phentermine (ADIPEX-P) 37.5 MG tablet Take 1 tablet (37.5 mg total) by mouth daily before breakfast.  . sucralfate (CARAFATE) 1 G tablet Take 1 tablet (1 g total) by mouth 4 (four) times daily -  with meals and at bedtime.  . traZODone (DESYREL) 50 MG tablet Take 50 mg by mouth at bedtime.  . Valproic Acid 250 MG CPDR Take 250 mg by  mouth 3 (three) times daily.  . Vitamin D, Ergocalciferol, (DRISDOL) 50000 UNITS CAPS capsule 1 tablet 3 times a week  . vortioxetine HBr (BRINTELLIX) 20 MG TABS Take 20 mg by mouth daily.   No current facility-administered medications on file prior to visit.     Review of Systems  Constitutional: Positive for fatigue. Negative for activity change, appetite change, chills, diaphoresis, fever and unexpected weight change.  HENT: Negative.   Respiratory: Negative.   Cardiovascular: Negative.   Gastrointestinal: Negative.   Genitourinary: Negative.   Musculoskeletal: Positive for myalgias.  Psychiatric/Behavioral: Positive for decreased concentration, dysphoric mood and sleep disturbance. Negative for self-injury.       Objective:   Physical Exam  Constitutional: She is oriented to person, place, and time. She appears well-developed and well-nourished.  HENT:  Head: Normocephalic and atraumatic.  Right Ear: External ear normal.  Left Ear: External ear normal.  Mouth/Throat: Oropharynx is clear and moist.  Eyes: Conjunctivae and EOM are normal. Pupils are equal, round, and reactive to light.  Neck: Normal range of motion. Neck supple. No thyromegaly present.  Cardiovascular: Normal rate, regular rhythm and normal heart sounds.  Exam reveals no gallop and no friction rub.   No murmur heard. Pulmonary/Chest: Effort normal and breath sounds normal. No respiratory distress. She has no wheezes.  Abdominal: Soft. Bowel sounds  are normal. She exhibits no distension and no mass. There is no tenderness. There is no rebound and no guarding.  Musculoskeletal: Normal range of motion.  Lymphadenopathy:    She has no cervical adenopathy.  Neurological: She is alert and oriented to person, place, and time. She displays normal reflexes. No cranial nerve deficit. Coordination normal.  Skin: Skin is warm and dry.  Psychiatric: She has a normal mood and affect.       Assessment & Plan:  Myalgias-  get on magnesium- will check Mag, iron, ferritin, B12 TSH- ? Need to switch to synthroid LFTs elevated- recheck 2-4 weeks avoid alcohol, non tender symptoms.  Insomnia- try cutting trazodone in half.   Future Appointments Date Time Provider [place]  03/30/2016 8:45 AM Vicie Mutters, PA-C [id number] None

## 2016-02-22 ENCOUNTER — Ambulatory Visit (INDEPENDENT_AMBULATORY_CARE_PROVIDER_SITE_OTHER): Payer: 59 | Admitting: Physician Assistant

## 2016-02-22 ENCOUNTER — Encounter: Payer: Self-pay | Admitting: Physician Assistant

## 2016-02-22 VITALS — BP 128/64 | HR 111 | Temp 97.5°F | Resp 14 | Ht 67.5 in | Wt 208.4 lb

## 2016-02-22 DIAGNOSIS — G43809 Other migraine, not intractable, without status migrainosus: Secondary | ICD-10-CM | POA: Diagnosis not present

## 2016-02-22 MED ORDER — SUMATRIPTAN SUCCINATE 100 MG PO TABS: 100.0000 mg | ORAL_TABLET | Freq: Once | ORAL | 0 refills | Status: DC | PRN

## 2016-02-22 MED ORDER — CYCLOBENZAPRINE HCL 10 MG PO TABS: 10.0000 mg | ORAL_TABLET | Freq: Every day | ORAL | 2 refills | Status: DC

## 2016-02-22 NOTE — Progress Notes (Signed)
Subjective:    Patient ID: Lisa [name], female    DOB: March 12, 1976, 40 y.o.   MRN: [id number]  HPI 40 y.o. WF with history of migraines, PTSD, anxiety, depression, seizure DO presents with migraine. Normally gets once a month but can just sleep, however at her work they have replaced the lights and have increased the lighting by double due to [employer] x 2-3 weeks, has been having migraine almost daily with vision changes, nausea, spots, nausea and having to go home from work. She has been taking NSAIDS, tylenol without help.   Doing weight loss challenge at work and needs form filled out.   Blood pressure 128/64, pulse (!) 111, temperature 97.5 F (36.4 C), resp. rate 14, height 5' 7.5" (1.715 m), weight 208 lb 6.4 oz (94.5 kg), last menstrual period 01/13/2016, SpO2 96 %.  Medications Current Outpatient Prescriptions on File Prior to Visit  Medication Sig  . diazepam (VALIUM) 5 MG tablet 1/2 to 1 daily as needed for severe anxiety  . esomeprazole (NEXIUM) 40 MG capsule Take 1 capsule by mouth  daily  . etonogestrel-ethinyl estradiol (NUVARING) 0.12-0.015 MG/24HR vaginal ring Insert one per vagina every 21 days for a total of 4 consecutive readings. Take a three-day break and then start with one ring every 21 days for a total of 4 readings again Please note this requires further rings every 3 months  . levothyroxine (SYNTHROID, LEVOTHROID) 300 MCG tablet Take 1 tablet (300 mcg total) by mouth daily before breakfast.  . metFORMIN (GLUCOPHAGE XR) 500 MG 24 hr tablet 2 times daily with largest meal  . Naltrexone-Bupropion HCl ER (CONTRAVE) 8-90 MG TB12 1 pill a morning for 1 week, then 1 pill twice a day for 1 week, 2 pills in the morning and one at night for 1 week, then 2 pill twice a day for 1 week and stay on this dose.  Marland Kitchen omeprazole (PRILOSEC) 20 MG capsule   . phentermine (ADIPEX-P) 37.5 MG tablet Take 1 tablet (37.5 mg total) by mouth daily before breakfast.  . sucralfate (CARAFATE) 1 G  tablet Take 1 tablet (1 g total) by mouth 4 (four) times daily -  with meals and at bedtime.  . traZODone (DESYREL) 50 MG tablet Take 50 mg by mouth at bedtime.  . Valproic Acid 250 MG CPDR Take 250 mg by mouth 3 (three) times daily.  . Vitamin D, Ergocalciferol, (DRISDOL) 50000 UNITS CAPS capsule 1 tablet 3 times a week  . vortioxetine HBr (BRINTELLIX) 20 MG TABS Take 20 mg by mouth daily.   No current facility-administered medications on file prior to visit.     Problem list She has Depression; PTSD (post-traumatic stress disorder); Obesity surgery status; History of recurrent miscarriages, not currently pregnant; Seizure disorder ([place]); Female infertility; Diabetes mellitus type II, controlled ([place]); Hypothyroidism; Panic attacks; Dissociative identity disorder; Anxiety; Infertility associated with anovulation; Habitual abortion history, antepartum; Obesity; Anemia; Vitamin D deficiency; Hyperlipidemia LDL goal <100; and Medication management on her problem list.  Review of Systems  Constitutional: Negative.  Negative for chills, fatigue and fever.  HENT: Negative for congestion, dental problem, drooling, ear discharge, ear pain, facial swelling, hearing loss, mouth sores, nosebleeds, postnasal drip, rhinorrhea, sinus pressure, sneezing, sore throat, tinnitus and trouble swallowing.   Eyes: Positive for photophobia and visual disturbance.  Respiratory: Negative for apnea, cough, choking, chest tightness, shortness of breath, wheezing and stridor.   Cardiovascular: Negative.   Genitourinary: Negative.   Neurological: Positive for headaches. Negative for dizziness,  tremors, seizures, syncope, facial asymmetry, speech difficulty, weakness, light-headedness and numbness.       Objective:   Physical Exam  Constitutional: She is oriented to person, place, and time. She appears well-developed and well-nourished. She appears distressed.  HENT:  Head: Normocephalic and atraumatic.  Right Ear:  External ear normal.  Left Ear: External ear normal.  Nose: Nose normal.  Mouth/Throat: Oropharynx is clear and moist. No oropharyngeal exudate.  + TMJ left worse than right without abscess  Eyes: Conjunctivae and EOM are normal. Pupils are equal, round, and reactive to light.  Neck: Normal range of motion. Neck supple.  Musculoskeletal: Normal range of motion.  Lymphadenopathy:    She has no cervical adenopathy.  Neurological: She is alert and oriented to person, place, and time. She has normal reflexes. No cranial nerve deficit. Coordination normal.       Assessment & Plan:  Migraines- TMJ- imitrex, injection given in the office, information given, flexeril sent in to take at night  Will fill out forms for work

## 2016-02-22 NOTE — Patient Instructions (Signed)
What is the TMJ? The temporomandibular (tem-PUH-ro-man-DIB-yoo-ler) joint, or the TMJ, connects the upper and lower jawbones. This joint allows the jaw to open wide and move back and forth when you chew, talk, or yawn.There are also several muscles that help this joint move. There can be muscle tightness and pain in the muscle that can cause several symptoms.  What causes TMJ pain? There are many causes of TMJ pain. Repeated chewing (for example, chewing gum) and clenching your teeth can cause pain in the joint. Some TMJ pain has no obvious cause. What can I do to ease the pain? There are many things you can do to help your pain get better. When you have pain:  Eat soft foods and stay away from chewy foods (for example, taffy) Try to use both sides of your mouth to chew Don't chew gum Massage Don't open your mouth wide (for example, during yawning or singing) Don't bite your cheeks or fingernails Lower your amount of stress and worry Applying a warm, damp washcloth to the joint may help. Over-the-counter pain medicines such as ibuprofen (one brand: Advil) or acetaminophen (one brand: Tylenol) might also help. Do not use these medicines if you are allergic to them or if your doctor told you not to use them. How can I stop the pain from coming back? When your pain is better, you can do these exercises to make your muscles stronger and to keep the pain from coming back:  Resisted mouth opening: Place your thumb or two fingers under your chin and open your mouth slowly, pushing up lightly on your chin with your thumb. Hold for three to six seconds. Close your mouth slowly. Resisted mouth closing: Place your thumbs under your chin and your two index fingers on the ridge between your mouth and the bottom of your chin. Push down lightly on your chin as you close your mouth. Tongue up: Slowly open and close your mouth while keeping the tongue touching the roof of the mouth. Side-to-side jaw movement:  Place an object about one fourth of an inch thick (for example, two tongue depressors) between your front teeth. Slowly move your jaw from side to side. Increase the thickness of the object as the exercise becomes easier Forward jaw movement: Place an object about one fourth of an inch thick between your front teeth and move the bottom jaw forward so that the bottom teeth are in front of the top teeth. Increase the thickness of the object as the exercise becomes easier. These exercises should not be painful. If it hurts to do these exercises, stop doing them and talk to your family doctor.    Migraine Headache A migraine headache is an intense, throbbing pain on one or both sides of your head. A migraine can last for 30 minutes to several hours. CAUSES  The exact cause of a migraine headache is not always known. However, a migraine may be caused when nerves in the brain become irritated and release chemicals that cause inflammation. This causes pain. Certain things may also trigger migraines, such as: Alcohol. Smoking. Stress. Menstruation. Aged cheeses. Foods or drinks that contain nitrates, glutamate, aspartame, or tyramine. Lack of sleep. Chocolate. Caffeine. Hunger. Physical exertion. Fatigue. Medicines used to treat chest pain (nitroglycerine), birth control pills, estrogen, and some blood pressure medicines. SIGNS AND SYMPTOMS Pain on one or both sides of your head. Pulsating or throbbing pain. Severe pain that prevents daily activities. Pain that is aggravated by any physical activity. Nausea, vomiting,  or both. Dizziness. Pain with exposure to bright lights, loud noises, or activity. General sensitivity to bright lights, loud noises, or smells. Before you get a migraine, you may get warning signs that a migraine is coming (aura). An aura may include: Seeing flashing lights. Seeing bright spots, halos, or zigzag lines. Having tunnel vision or blurred vision. Having  feelings of numbness or tingling. Having trouble talking. Having muscle weakness. DIAGNOSIS  A migraine headache is often diagnosed based on: Symptoms. Physical exam. A CT scan or MRI of your head. These imaging tests cannot diagnose migraines, but they can help rule out other causes of headaches. TREATMENT Medicines may be given for pain and nausea. Medicines can also be given to help prevent recurrent migraines.  HOME CARE INSTRUCTIONS Only take over-the-counter or prescription medicines for pain or discomfort as directed by your health care provider. The use of long-term narcotics is not recommended. Lie down in a dark, quiet room when you have a migraine. Keep a journal to find out what may trigger your migraine headaches. For example, write down: What you eat and drink. How much sleep you get. Any change to your diet or medicines. Limit alcohol consumption. Quit smoking if you smoke. Get 7-9 hours of sleep, or as recommended by your health care provider. Limit stress. Keep lights dim if bright lights bother you and make your migraines worse. SEEK IMMEDIATE MEDICAL CARE IF:  Your migraine becomes severe. You have a fever. You have a stiff neck. You have vision loss. You have muscular weakness or loss of muscle control. You start losing your balance or have trouble walking. You feel faint or pass out. You have severe symptoms that are different from your first symptoms. MAKE SURE YOU:  Understand these instructions. Will watch your condition. Will get help right away if you are not doing well or get worse.   This information is not intended to replace advice given to you by your health care provider. Make sure you discuss any questions you have with your health care provider.   Document Released: 06/11/2005 Document Revised: 07/02/2014 Document Reviewed: 02/16/2013 Elsevier Interactive Patient Education [employer].

## 2016-03-07 ENCOUNTER — Other Ambulatory Visit: Payer: Self-pay | Admitting: Physician Assistant

## 2016-03-07 MED ORDER — LEVOFLOXACIN 500 MG PO TABS: 500.0000 mg | ORAL_TABLET | Freq: Every day | ORAL | 0 refills | Status: DC

## 2016-03-07 NOTE — Progress Notes (Signed)
Patient got recent piercing with swelling, warmth, redness, will send in levaquin for coverage, 10 days.

## 2016-03-09 ENCOUNTER — Other Ambulatory Visit: Payer: Self-pay | Admitting: Physician Assistant

## 2016-03-12 NOTE — Telephone Encounter (Signed)
Rx called into [facility].

## 2016-03-27 ENCOUNTER — Ambulatory Visit: Payer: Self-pay | Admitting: Physician Assistant

## 2016-03-30 ENCOUNTER — Ambulatory Visit: Payer: Self-pay | Admitting: Physician Assistant

## 2016-04-17 ENCOUNTER — Other Ambulatory Visit: Payer: Self-pay

## 2016-04-17 MED ORDER — LEVOTHYROXINE SODIUM 300 MCG PO TABS: 300.0000 ug | ORAL_TABLET | Freq: Every day | ORAL | 1 refills | Status: DC

## 2016-04-17 MED ORDER — VALPROIC ACID 250 MG PO CPDR: 250.0000 mg | DELAYED_RELEASE_CAPSULE | Freq: Three times a day (TID) | ORAL | 1 refills | Status: DC

## 2016-04-18 ENCOUNTER — Other Ambulatory Visit: Payer: Self-pay

## 2016-04-18 MED ORDER — VALPROIC ACID 250 MG PO CPDR: 250.0000 mg | DELAYED_RELEASE_CAPSULE | Freq: Three times a day (TID) | ORAL | 1 refills | Status: DC

## 2016-04-18 MED ORDER — LEVOTHYROXINE SODIUM 300 MCG PO TABS: 300.0000 ug | ORAL_TABLET | Freq: Every day | ORAL | 1 refills | Status: DC

## 2016-04-30 ENCOUNTER — Encounter: Payer: Self-pay | Admitting: Physician Assistant

## 2016-04-30 ENCOUNTER — Ambulatory Visit (INDEPENDENT_AMBULATORY_CARE_PROVIDER_SITE_OTHER): Payer: 59 | Admitting: Physician Assistant

## 2016-04-30 VITALS — BP 130/80 | HR 78 | Temp 97.5°F | Resp 14 | Ht 67.5 in | Wt 222.0 lb

## 2016-04-30 DIAGNOSIS — E785 Hyperlipidemia, unspecified: Secondary | ICD-10-CM | POA: Diagnosis not present

## 2016-04-30 DIAGNOSIS — D649 Anemia, unspecified: Secondary | ICD-10-CM

## 2016-04-30 DIAGNOSIS — E119 Type 2 diabetes mellitus without complications: Secondary | ICD-10-CM | POA: Diagnosis not present

## 2016-04-30 DIAGNOSIS — E6609 Other obesity due to excess calories: Secondary | ICD-10-CM | POA: Diagnosis not present

## 2016-04-30 DIAGNOSIS — G40909 Epilepsy, unspecified, not intractable, without status epilepticus: Secondary | ICD-10-CM | POA: Diagnosis not present

## 2016-04-30 DIAGNOSIS — E559 Vitamin D deficiency, unspecified: Secondary | ICD-10-CM

## 2016-04-30 DIAGNOSIS — E039 Hypothyroidism, unspecified: Secondary | ICD-10-CM | POA: Diagnosis not present

## 2016-04-30 DIAGNOSIS — Z6833 Body mass index (BMI) 33.0-33.9, adult: Secondary | ICD-10-CM | POA: Diagnosis not present

## 2016-04-30 DIAGNOSIS — Z79899 Other long term (current) drug therapy: Secondary | ICD-10-CM

## 2016-04-30 DIAGNOSIS — Z23 Encounter for immunization: Secondary | ICD-10-CM

## 2016-04-30 MED ORDER — VALPROIC ACID 250 MG PO CPDR: 500.0000 mg | DELAYED_RELEASE_CAPSULE | Freq: Three times a day (TID) | ORAL | 4 refills | Status: DC

## 2016-04-30 MED ORDER — PHENTERMINE HCL 37.5 MG PO TABS: ORAL_TABLET | ORAL | 3 refills | Status: DC

## 2016-04-30 NOTE — Progress Notes (Signed)
Assessment and Plan:  Hypertension -Continue medication, monitor blood pressure at home. Continue DASH diet.  Reminder to go to the ER if any CP, SOB, nausea, dizziness, severe HA, changes vision/speech, left arm numbness and tingling and jaw pain.  Cholesterol -Continue diet and exercise. Check cholesterol.   Diabetes without complications -Continue diet and exercise. Check A1C Continue weight loss  Vitamin D Def - check level and continue medications.   Obesity with co morbidities - long discussion about weight loss, diet, and exercise - phentermine  Hypothyroidism -check TSH level, continue medications the same, reminded to take on an empty stomach 30-87mins before food.   Patient is s/p gastric bypass - get on iron daily, will check iron,ferritin, mag,copper    Over 30 minutes of exam, counseling, chart review, and critical decision making was performed   HPI 40 y.o. white female  presents for 3 month follow up on hypertension, cholesterol, diabetes and vitamin D deficiency.   Her blood pressure has been controlled at home, today her BP is BP: 130/80.  She does workout, walking daily.  She denies chest pain, shortness of breath, dizziness. She continue to smoke.   She is not on cholesterol medication and denies myalgias. Her cholesterol is at goal. The cholesterol was:   Lab Results  Component Value Date   CHOL 225 (H) 01/18/2016   HDL 71 01/18/2016   LDLCALC 127 (H) 01/18/2016   TRIG 134 01/18/2016   She has a history of seizures x age of 40 but has been stable on valproic acid 250, has not had a seizure in over a decade.  She has history of gastric bypass but due to stress and poor eating habits has started to gain back her weight 4 year s/p bypass, starting weight was 260. She has iron def due to her bypass.  Lab Results  Component Value Date   WBC 5.8 01/18/2016   HGB 11.5 10/07/2014   HCT 33.2 (L) 01/18/2016   MCV 80 01/18/2016   PLT 387 (H) 01/18/2016    Lab Results  Component Value Date   IRON 32 11/24/2015   TIBC 549 (H) 05/12/2015   FERRITIN 6 (L) 11/24/2015   She is on thyroid medication. Her medication was not changed last visit.   Lab Results  Component Value Date   TSH 4.880 (H) 01/18/2016  .   She has been working on diet and exercise for diabetes, last visit her A1C has gone from preDM to DM range without complications , she is not on bASA, she is not on ACE/ARB, currently diet controlled and denies  paresthesia of the feet, polydipsia, polyuria and visual disturbances. Last A1C was:  Lab Results  Component Value Date   HGBA1C 5.7 (H) 01/18/2016    Patient is on Vitamin D supplement. Lab Results  Component Value Date   VD25OH 37.0 01/18/2016   BMI is Body mass index is 34.26 kg/m., she is working on diet and exercise,she has not been on the contrave, would like the phentermine.  Wt Readings from Last 3 Encounters:  04/30/16 222 lb (100.7 kg)  02/22/16 208 lb 6.4 oz (94.5 kg)  01/24/16 209 lb 9.6 oz (95.1 kg)    Current Medications:    Medication List       Accurate as of 04/30/16  8:47 AM. Always use your most recent med list.          BRINTELLIX 20 MG Tabs Generic drug:  vortioxetine HBr Take 20 mg by mouth  daily.   diazepam 5 MG tablet Commonly known as:  VALIUM 1/2 to 1 daily as needed for severe anxiety   esomeprazole 40 MG capsule Commonly known as:  NEXIUM Take 1 capsule by mouth  daily   etonogestrel-ethinyl estradiol 0.12-0.015 MG/24HR vaginal ring Commonly known as:  NUVARING Insert one per vagina every 21 days for a total of 4 consecutive readings. Take a three-day break and then start with one ring every 21 days for a total of 4 readings again Please note this requires further rings every 3 months   levothyroxine 300 MCG tablet Commonly known as:  SYNTHROID, LEVOTHROID Take 1 tablet (300 mcg total) by mouth daily before breakfast.   metFORMIN 500 MG 24 hr tablet Commonly known as:   GLUCOPHAGE XR 2 times daily with largest meal   Naltrexone-Bupropion HCl ER 8-90 MG Tb12 Commonly known as:  CONTRAVE 1 pill a morning for 1 week, then 1 pill twice a day for 1 week, 2 pills in the morning and one at night for 1 week, then 2 pill twice a day for 1 week and stay on this dose.   omeprazole 20 MG capsule Commonly known as:  PRILOSEC   phentermine 37.5 MG tablet Commonly known as:  ADIPEX-P TAKE ONE TABLET (37.5 MG TOTAL) BY MOUTH DAILY BEFORE BREAKFAST   sucralfate 1 g tablet Commonly known as:  CARAFATE Take 1 tablet (1 g total) by mouth 4 (four) times daily -  with meals and at bedtime.   SUMAtriptan 100 MG tablet Commonly known as:  IMITREX Take 1 tablet (100 mg total) by mouth once as needed for migraine. May repeat in 2 hours if headache persists or recurs.   traZODone 50 MG tablet Commonly known as:  DESYREL Take 50 mg by mouth at bedtime.   Valproic Acid 250 MG Cpdr Take 2 capsules (500 mg total) by mouth 3 (three) times daily.   Vitamin D (Ergocalciferol) 50000 units Caps capsule Commonly known as:  DRISDOL 1 tablet 3 times a week       Medical History:  Past Medical History:  Diagnosis Date  . Abdominal cramping 08/10/2011  . Anxiety   . Asthma   . Depression   . Habitual abortion history, antepartum   . Headache(784.0)   . History of diabetes mellitus    prior to gastric bypass in 2012  . Hypothyroidism   . Obesity    BMi 33  . Seizures ([place])   . UTI (lower urinary tract infection) 08/14/2011   Allergies:  Allergies  Allergen Reactions  . Codeine Itching, Rash and Other (See Comments)    OPIOIDS - MORPHINE & RELATED     Review of Systems:  Review of Systems  Constitutional: Positive for malaise/fatigue. Negative for chills, diaphoresis, fever and weight loss.  HENT: Negative.   Respiratory: Negative.   Cardiovascular: Negative.   Gastrointestinal: Negative for abdominal pain, blood in stool, constipation, diarrhea, heartburn,  melena, nausea and vomiting.  Genitourinary: Negative.   Musculoskeletal: Negative.   Skin: Negative.   Neurological: Negative.  Negative for weakness.  Psychiatric/Behavioral: Positive for depression. Negative for hallucinations, memory loss, substance abuse and suicidal ideas. The patient is nervous/anxious and has insomnia.     Family history- Review and unchanged Social history- Review and unchanged Physical Exam: BP 130/80   Pulse 78   Temp 97.5 F (36.4 C)   Resp 14   Ht 5' 7.5" (1.715 m)   Wt 222 lb (100.7 kg)   LMP  04/22/2016   SpO2 96%   BMI 34.26 kg/m  Wt Readings from Last 3 Encounters:  04/30/16 222 lb (100.7 kg)  02/22/16 208 lb 6.4 oz (94.5 kg)  01/24/16 209 lb 9.6 oz (95.1 kg)   General Appearance: Well nourished, in no apparent distress. Eyes: PERRLA, EOMs, conjunctiva no swelling or erythema Sinuses: No Frontal/maxillary tenderness ENT/Mouth: Ext aud canals clear, TMs without erythema, bulging. No erythema, swelling, or exudate on post pharynx.  Tonsils not swollen or erythematous. Hearing normal.  Neck: Supple, thyroid normal.  Respiratory: Respiratory effort normal,  Without wheezing, rales, rhonchi, or stridor.  Cardio: RRR with no MRGs. Brisk peripheral pulses without edema.  Abdomen: Soft, normal BS, nontender, no guarding, rebound, hernias, masses. Lymphatics: Non tender without lymphadenopathy.  Musculoskeletal: Full ROM, 5/5 strength, Normal gait Skin: Warm, dry without rashes, lesions, ecchymosis.  Neuro: Cranial nerves intact. No cerebellar symptoms.  Psych: Awake and oriented X 3, normal affect, Insight and Judgment appropriate.    Vicie Mutters, PA-C 8:47 AM [facility]

## 2016-07-15 ENCOUNTER — Other Ambulatory Visit: Payer: Self-pay | Admitting: Physician Assistant

## 2016-07-15 MED ORDER — PREDNISONE 20 MG PO TABS: ORAL_TABLET | ORAL | 0 refills | Status: DC

## 2016-07-15 MED ORDER — PROMETHAZINE-DM 6.25-15 MG/5ML PO SYRP: 5.0000 mL | ORAL_SOLUTION | Freq: Four times a day (QID) | ORAL | 1 refills | Status: DC | PRN

## 2016-08-07 ENCOUNTER — Ambulatory Visit: Payer: Self-pay | Admitting: Physician Assistant

## 2016-08-07 NOTE — Progress Notes (Deleted)
Assessment and Plan:  Hypertension -Continue medication, monitor blood pressure at home. Continue DASH diet.  Reminder to go to the ER if any CP, SOB, nausea, dizziness, severe HA, changes vision/speech, left arm numbness and tingling and jaw pain.  Cholesterol -Continue diet and exercise. Check cholesterol.   Diabetes without complications -Continue diet and exercise. Check A1C Continue weight loss  Vitamin D Def - check level and continue medications.   Obesity with co morbidities - long discussion about weight loss, diet, and exercise - phentermine  Hypothyroidism -check TSH level, continue medications the same, reminded to take on an empty stomach 30-30mins before food.   Patient is s/p gastric bypass - get on iron daily, will check iron,ferritin, mag,copper    Over 30 minutes of exam, counseling, chart review, and critical decision making was performed   HPI 41 y.o. white female  presents for 3 month follow up on hypertension, cholesterol, diabetes and vitamin D deficiency.   Her blood pressure has been controlled at home, today her BP is  .  She does workout, walking daily.  She denies chest pain, shortness of breath, dizziness. She continue to smoke.   She is not on cholesterol medication and denies myalgias. Her cholesterol is at goal. The cholesterol was:   Lab Results  Component Value Date   CHOL 225 (H) 01/18/2016   HDL 71 01/18/2016   LDLCALC 127 (H) 01/18/2016   TRIG 134 01/18/2016   She has a history of seizures x age of 62 but has been stable on valproic acid 250, has not had a seizure in over a decade.  She has history of gastric bypass but due to stress and poor eating habits has started to gain back her weight 4 year s/p bypass, starting weight was 260. She has iron def due to her bypass.  Lab Results  Component Value Date   WBC 5.8 01/18/2016   HGB 11.5 10/07/2014   HCT 33.2 (L) 01/18/2016   MCV 80 01/18/2016   PLT 387 (H) 01/18/2016   Lab Results   Component Value Date   IRON 32 11/24/2015   TIBC 549 (H) 05/12/2015   FERRITIN 6 (L) 11/24/2015   She is on thyroid medication. Her medication was not changed last visit.   Lab Results  Component Value Date   TSH 4.880 (H) 01/18/2016  .   She has been working on diet and exercise for diabetes, last visit her A1C has gone from preDM to DM range without complications , she is not on bASA, she is not on ACE/ARB, currently diet controlled and denies  paresthesia of the feet, polydipsia, polyuria and visual disturbances. Last A1C was:  Lab Results  Component Value Date   HGBA1C 5.7 (H) 01/18/2016    Patient is on Vitamin D supplement. Lab Results  Component Value Date   VD25OH 37.0 01/18/2016   BMI is There is no height or weight on file to calculate BMI., she is working on diet and exercise,she has not been on the contrave, would like the phentermine.  Wt Readings from Last 3 Encounters:  04/30/16 222 lb (100.7 kg)  02/22/16 208 lb 6.4 oz (94.5 kg)  01/24/16 209 lb 9.6 oz (95.1 kg)    Current Medications:  Allergies as of 08/07/2016      Reactions   Codeine Itching, Rash, Other (See Comments)   OPIOIDS - MORPHINE & RELATED      Medication List       Accurate as of 08/07/16  12:56 PM. Always use your most recent med list.          BRINTELLIX 20 MG Tabs Generic drug:  vortioxetine HBr Take 20 mg by mouth daily.   diazepam 5 MG tablet Commonly known as:  VALIUM 1/2 to 1 daily as needed for severe anxiety   esomeprazole 40 MG capsule Commonly known as:  NEXIUM Take 1 capsule by mouth  daily   etonogestrel-ethinyl estradiol 0.12-0.015 MG/24HR vaginal ring Commonly known as:  NUVARING Insert one per vagina every 21 days for a total of 4 consecutive readings. Take a three-day break and then start with one ring every 21 days for a total of 4 readings again Please note this requires further rings every 3 months   levothyroxine 300 MCG tablet Commonly known as:  SYNTHROID,  LEVOTHROID Take 1 tablet (300 mcg total) by mouth daily before breakfast.   metFORMIN 500 MG 24 hr tablet Commonly known as:  GLUCOPHAGE XR 2 times daily with largest meal   Naltrexone-Bupropion HCl ER 8-90 MG Tb12 Commonly known as:  CONTRAVE 1 pill a morning for 1 week, then 1 pill twice a day for 1 week, 2 pills in the morning and one at night for 1 week, then 2 pill twice a day for 1 week and stay on this dose.   omeprazole 20 MG capsule Commonly known as:  PRILOSEC   phentermine 37.5 MG tablet Commonly known as:  ADIPEX-P TAKE ONE TABLET (37.5 MG TOTAL) BY MOUTH DAILY BEFORE BREAKFAST   predniSONE 20 MG tablet Commonly known as:  DELTASONE 2 tablets daily for 3 days, 1 tablet daily for 4 days or as directed   promethazine-dextromethorphan 6.25-15 MG/5ML syrup Commonly known as:  PROMETHAZINE-DM Take 5 mLs by mouth 4 (four) times daily as needed for cough.   sucralfate 1 g tablet Commonly known as:  CARAFATE Take 1 tablet (1 g total) by mouth 4 (four) times daily -  with meals and at bedtime.   SUMAtriptan 100 MG tablet Commonly known as:  IMITREX Take 1 tablet (100 mg total) by mouth once as needed for migraine. May repeat in 2 hours if headache persists or recurs.   traZODone 50 MG tablet Commonly known as:  DESYREL Take 50 mg by mouth at bedtime.   Valproic Acid 250 MG Cpdr Take 2 capsules (500 mg total) by mouth 3 (three) times daily.   Vitamin D (Ergocalciferol) 50000 units Caps capsule Commonly known as:  DRISDOL 1 tablet 3 times a week       Medical History:  Past Medical History:  Diagnosis Date  . Abdominal cramping 08/10/2011  . Anxiety   . Asthma   . Depression   . Habitual abortion history, antepartum   . Headache(784.0)   . History of diabetes mellitus    prior to gastric bypass in 2012  . Hypothyroidism   . Obesity    BMi 33  . Seizures ([place])   . UTI (lower urinary tract infection) 08/14/2011   Allergies:  Allergies  Allergen  Reactions  . Codeine Itching, Rash and Other (See Comments)    OPIOIDS - MORPHINE & RELATED     Review of Systems:  Review of Systems  Constitutional: Positive for malaise/fatigue. Negative for chills, diaphoresis, fever and weight loss.  HENT: Negative.   Respiratory: Negative.   Cardiovascular: Negative.   Gastrointestinal: Negative for abdominal pain, blood in stool, constipation, diarrhea, heartburn, melena, nausea and vomiting.  Genitourinary: Negative.   Musculoskeletal: Negative.  Skin: Negative.   Neurological: Negative.  Negative for weakness.  Psychiatric/Behavioral: Positive for depression. Negative for hallucinations, memory loss, substance abuse and suicidal ideas. The patient is nervous/anxious and has insomnia.     Family history- Review and unchanged Social history- Review and unchanged Physical Exam: There were no vitals taken for this visit. Wt Readings from Last 3 Encounters:  04/30/16 222 lb (100.7 kg)  02/22/16 208 lb 6.4 oz (94.5 kg)  01/24/16 209 lb 9.6 oz (95.1 kg)   General Appearance: Well nourished, in no apparent distress. Eyes: PERRLA, EOMs, conjunctiva no swelling or erythema Sinuses: No Frontal/maxillary tenderness ENT/Mouth: Ext aud canals clear, TMs without erythema, bulging. No erythema, swelling, or exudate on post pharynx.  Tonsils not swollen or erythematous. Hearing normal.  Neck: Supple, thyroid normal.  Respiratory: Respiratory effort normal,  Without wheezing, rales, rhonchi, or stridor.  Cardio: RRR with no MRGs. Brisk peripheral pulses without edema.  Abdomen: Soft, normal BS, nontender, no guarding, rebound, hernias, masses. Lymphatics: Non tender without lymphadenopathy.  Musculoskeletal: Full ROM, 5/5 strength, Normal gait Skin: Warm, dry without rashes, lesions, ecchymosis.  Neuro: Cranial nerves intact. No cerebellar symptoms.  Psych: Awake and oriented X 3, normal affect, Insight and Judgment appropriate.    Vicie Mutters,  PA-C 12:56 PM [facility]

## 2016-08-16 ENCOUNTER — Other Ambulatory Visit: Payer: Self-pay | Admitting: Physician Assistant

## 2016-09-19 ENCOUNTER — Other Ambulatory Visit: Payer: Self-pay

## 2016-09-19 MED ORDER — TRAZODONE HCL 50 MG PO TABS: 50.0000 mg | ORAL_TABLET | Freq: Every day | ORAL | 0 refills | Status: DC

## 2016-10-15 ENCOUNTER — Other Ambulatory Visit: Payer: Self-pay | Admitting: Physician Assistant

## 2016-10-15 DIAGNOSIS — D649 Anemia, unspecified: Secondary | ICD-10-CM | POA: Diagnosis not present

## 2016-10-15 DIAGNOSIS — E1165 Type 2 diabetes mellitus with hyperglycemia: Secondary | ICD-10-CM | POA: Diagnosis not present

## 2016-10-15 DIAGNOSIS — Z79899 Other long term (current) drug therapy: Secondary | ICD-10-CM | POA: Diagnosis not present

## 2016-10-16 LAB — CBC WITH DIFFERENTIAL/PLATELET
BASOS ABS: 0 10*3/uL (ref 0.0–0.2)
BASOS: 1 %
EOS (ABSOLUTE): 0.4 10*3/uL (ref 0.0–0.4)
EOS: 5 %
HEMATOCRIT: 31.2 % — AB (ref 34.0–46.6)
HEMOGLOBIN: 9.2 g/dL — AB (ref 11.1–15.9)
IMMATURE GRANS (ABS): 0 10*3/uL (ref 0.0–0.1)
Immature Granulocytes: 0 %
LYMPHS: 36 %
Lymphocytes Absolute: 3 10*3/uL (ref 0.7–3.1)
MCH: 21.6 pg — AB (ref 26.6–33.0)
MCHC: 29.5 g/dL — ABNORMAL LOW (ref 31.5–35.7)
MCV: 73 fL — ABNORMAL LOW (ref 79–97)
MONOCYTES: 10 %
Monocytes Absolute: 0.8 10*3/uL (ref 0.1–0.9)
NEUTROS ABS: 3.9 10*3/uL (ref 1.4–7.0)
Neutrophils: 48 %
Platelets: 349 10*3/uL (ref 150–379)
RBC: 4.26 x10E6/uL (ref 3.77–5.28)
RDW: 18.1 % — ABNORMAL HIGH (ref 12.3–15.4)
WBC: 8.2 10*3/uL (ref 3.4–10.8)

## 2016-10-16 LAB — CALCITRIOL (1,25 DI-OH VIT D): VIT D 1 25 DIHYDROXY: 107 pg/mL — AB (ref 19.9–79.3)

## 2016-10-16 LAB — LIPID PANEL W/O CHOL/HDL RATIO
CHOLESTEROL TOTAL: 184 mg/dL (ref 100–199)
HDL: 67 mg/dL (ref >39)
LDL CALC: 88 mg/dL (ref 0–99)
Triglycerides: 144 mg/dL (ref 0–149)
VLDL CHOLESTEROL CAL: 29 mg/dL (ref 5–40)

## 2016-10-16 LAB — BASIC METABOLIC PANEL
BUN / CREAT RATIO: 14 (ref 9–23)
BUN: 10 mg/dL (ref 6–24)
CO2: 21 mmol/L (ref 18–29)
CREATININE: 0.71 mg/dL (ref 0.57–1.00)
Calcium: 9.2 mg/dL (ref 8.7–10.2)
Chloride: 105 mmol/L (ref 96–106)
GFR calc Af Amer: 123 mL/min/{1.73_m2} (ref >59)
GFR calc non Af Amer: 107 mL/min/{1.73_m2} (ref >59)
GLUCOSE: 101 mg/dL — AB (ref 65–99)
Potassium: 4.9 mmol/L (ref 3.5–5.2)
Sodium: 143 mmol/L (ref 134–144)

## 2016-10-16 LAB — VITAMIN D 25 HYDROXY (VIT D DEFICIENCY, FRACTURES): VIT D 25 HYDROXY: 29.5 ng/mL — AB (ref 30.0–100.0)

## 2016-10-16 LAB — FERRITIN: Ferritin: 7 ng/mL — ABNORMAL LOW (ref 15–150)

## 2016-10-16 LAB — IRON: IRON: 13 ug/dL — AB (ref 27–159)

## 2016-10-16 LAB — B12 AND FOLATE PANEL
Folate: >20 ng/mL (ref >3.0)
VITAMIN B 12: 1599 pg/mL — AB (ref 232–1245)

## 2016-10-16 LAB — HGB A1C W/O EAG: Hgb A1c MFr Bld: 6 % — ABNORMAL HIGH (ref 4.8–5.6)

## 2016-10-16 LAB — MAGNESIUM: Magnesium: 2.2 mg/dL (ref 1.6–2.3)

## 2016-10-16 NOTE — Progress Notes (Signed)
Assessment and Plan:  Hypertension -Continue medication, monitor blood pressure at home. Continue DASH diet.  Reminder to go to the ER if any CP, SOB, nausea, dizziness, severe HA, changes vision/speech, left arm numbness and tingling and jaw pain.  Cholesterol -Continue diet and exercise. Check cholesterol.   Diabetes without complications -Continue diet and exercise. Check A1C Continue weight loss  Vitamin D Def - check level and continue medications.   Obesity with co morbidities - long discussion about weight loss, diet, and exercise - phentermine  Hypothyroidism -check TSH level, continue medications the same, reminded to take on an empty stomach 30-40mns before food.   Patient is s/p gastric bypass - get on iron daily, will check iron,ferritin, mag,copper   Joint pain Will check lyme/RMSF, check RF, ESR Rest, aleve, wear carpal tunnel splint at night  Over 30 minutes of exam, counseling, chart review, and critical decision making was performed [facility] Future Appointments Date Time Provider DDeercroft 02/14/2017 3:00 PM AVicie Mutters PA-C [facility] None    HPI 41y.o. white female  presents for 3 month follow up on hypertension, cholesterol, diabetes and vitamin D deficiency.   Her blood pressure has been controlled at home, today her BP is BP: 124/86.  She does workout, walking daily.  She denies chest pain, shortness of breath, dizziness. She continues to smoke.  Bilateral hand, ankle pain, has been moved out in the woods, has improved some but would like lyme/RMSF testing. Family history of RA in great aunts, constantly stiff, does cData processing manager   She is not on cholesterol medication and denies myalgias. Her cholesterol is at goal. The cholesterol was:   Lab Results  Component Value Date   CHOL 225 (H) 01/18/2016   HDL 71 01/18/2016   LDLCALC 127 (H) 01/18/2016   TRIG 134 01/18/2016   She has a history of seizures x age of  89but has been stable on valproic acid 250, has not had a seizure in over a decade.  She has history of gastric bypass but due to stress and poor eating habits has started to gain back her weight 4 year s/p bypass, starting weight was 260. She has iron def due to her bypass.  Lab Results  Component Value Date   WBC 5.8 01/18/2016   HGB 11.5 10/07/2014   HCT 33.2 (L) 01/18/2016   MCV 80 01/18/2016   PLT 387 (H) 01/18/2016   Lab Results  Component Value Date   IRON 32 11/24/2015   TIBC 549 (H) 05/12/2015   FERRITIN 6 (L) 11/24/2015   She is on thyroid medication. Her medication was not changed last visit.   Lab Results  Component Value Date   TSH 4.880 (H) 01/18/2016  .   She has been working on diet and exercise for diabetes, last visit her A1C has gone from preDM to DM range without complications , she is not on bASA, she is not on ACE/ARB, currently diet controlled and denies  paresthesia of the feet, polydipsia, polyuria and visual disturbances. Last A1C was:  Lab Results  Component Value Date   HGBA1C 5.7 (H) 01/18/2016    Patient is on Vitamin D supplement. Lab Results  Component Value Date   VD25OH 37.0 01/18/2016   BMI is Body mass index is 34.01 kg/m., she is working on diet and exercise,she has not been on the contrave, would like the phentermine, doing healthy weight challenge at work.  Wt Readings from Last  3 Encounters:  10/17/16 220 lb 6.4 oz (100 kg)  04/30/16 222 lb (100.7 kg)  02/22/16 208 lb 6.4 oz (94.5 kg)    Current Medications:  Allergies as of 10/17/2016      Reactions   Codeine Itching, Rash, Other (See Comments)   OPIOIDS - MORPHINE & RELATED      Medication List       Accurate as of 10/17/16  3:39 PM. Always use your most recent med list.          BRINTELLIX 20 MG Tabs Generic drug:  vortioxetine HBr Take 20 mg by mouth daily.   diazepam 5 MG tablet Commonly known as:  VALIUM 1/2 to 1 daily as needed for severe anxiety    etonogestrel-ethinyl estradiol 0.12-0.015 MG/24HR vaginal ring Commonly known as:  NUVARING Insert one per vagina every 21 days for a total of 4 consecutive readings. Take a three-day break and then start with one ring every 21 days for a total of 4 readings again Please note this requires further rings every 3 months   levothyroxine 300 MCG tablet Commonly known as:  SYNTHROID, LEVOTHROID TAKE 1 TABLET BY MOUTH  DAILY BEFORE BREAKFAST   omeprazole 20 MG capsule Commonly known as:  PRILOSEC   phentermine 37.5 MG tablet Commonly known as:  ADIPEX-P TAKE ONE TABLET (37.5 MG TOTAL) BY MOUTH DAILY BEFORE BREAKFAST   SUMAtriptan 100 MG tablet Commonly known as:  IMITREX Take 1 tablet (100 mg total) by mouth once as needed for migraine. May repeat in 2 hours if headache persists or recurs.   traZODone 50 MG tablet Commonly known as:  DESYREL Take 1 tablet (50 mg total) by mouth at bedtime.   Valproic Acid 250 MG Cpdr Take 2 capsules (500 mg total) by mouth 3 (three) times daily.       Medical History:  Past Medical History:  Diagnosis Date  . Abdominal cramping 08/10/2011  . Anxiety   . Asthma   . Depression   . Habitual abortion history, antepartum   . Headache(784.0)   . History of diabetes mellitus    prior to gastric bypass in 2012  . Hypothyroidism   . Obesity    BMi 33  . Seizures ([place])   . UTI (lower urinary tract infection) 08/14/2011   Allergies:  Allergies  Allergen Reactions  . Codeine Itching, Rash and Other (See Comments)    OPIOIDS - MORPHINE & RELATED     Review of Systems:  Review of Systems  Constitutional: Positive for malaise/fatigue. Negative for chills, diaphoresis, fever and weight loss.  HENT: Negative.   Respiratory: Negative.   Cardiovascular: Negative.   Gastrointestinal: Negative for abdominal pain, blood in stool, constipation, diarrhea, heartburn, melena, nausea and vomiting.  Genitourinary: Negative.   Musculoskeletal: Negative.    Skin: Negative.   Neurological: Negative.  Negative for weakness.  Psychiatric/Behavioral: Positive for depression. Negative for hallucinations, memory loss, substance abuse and suicidal ideas. The patient is nervous/anxious and has insomnia.     Family history- Review and unchanged Social history- Review and unchanged Physical Exam: BP 124/86   Pulse 86   Temp 97.7 F (36.5 C)   Resp 16   Ht 5' 7.5" (1.715 m)   Wt 220 lb 6.4 oz (100 kg)   LMP 09/22/2016   SpO2 96%   BMI 34.01 kg/m  Wt Readings from Last 3 Encounters:  10/17/16 220 lb 6.4 oz (100 kg)  04/30/16 222 lb (100.7 kg)  02/22/16 208 lb  6.4 oz (94.5 kg)   General Appearance: Well nourished, in no apparent distress. Eyes: PERRLA, EOMs, conjunctiva no swelling or erythema Sinuses: No Frontal/maxillary tenderness ENT/Mouth: Ext aud canals clear, TMs without erythema, bulging. No erythema, swelling, or exudate on post pharynx.  Tonsils not swollen or erythematous. Hearing normal.  Neck: Supple, thyroid normal.  Respiratory: Respiratory effort normal,  Without wheezing, rales, rhonchi, or stridor.  Cardio: RRR with no MRGs. Brisk peripheral pulses without edema.  Abdomen: Soft, normal BS, nontender, no guarding, rebound, hernias, masses. Lymphatics: Non tender without lymphadenopathy.  Musculoskeletal: Full ROM, 5/5 strength, Normal gait Skin: Warm, dry without rashes, lesions, ecchymosis.  Neuro: Cranial nerves intact. No cerebellar symptoms.  Psych: Awake and oriented X 3, normal affect, Insight and Judgment appropriate.    Vicie Mutters, PA-C 3:39 PM [facility]

## 2016-10-17 ENCOUNTER — Encounter: Payer: Self-pay | Admitting: Physician Assistant

## 2016-10-17 ENCOUNTER — Ambulatory Visit (INDEPENDENT_AMBULATORY_CARE_PROVIDER_SITE_OTHER): Payer: 59 | Admitting: Physician Assistant

## 2016-10-17 VITALS — BP 124/86 | HR 86 | Temp 97.7°F | Resp 16 | Ht 67.5 in | Wt 220.4 lb

## 2016-10-17 DIAGNOSIS — E039 Hypothyroidism, unspecified: Secondary | ICD-10-CM | POA: Diagnosis not present

## 2016-10-17 DIAGNOSIS — Z6833 Body mass index (BMI) 33.0-33.9, adult: Secondary | ICD-10-CM | POA: Diagnosis not present

## 2016-10-17 DIAGNOSIS — D649 Anemia, unspecified: Secondary | ICD-10-CM | POA: Diagnosis not present

## 2016-10-17 DIAGNOSIS — E559 Vitamin D deficiency, unspecified: Secondary | ICD-10-CM

## 2016-10-17 DIAGNOSIS — E785 Hyperlipidemia, unspecified: Secondary | ICD-10-CM | POA: Diagnosis not present

## 2016-10-17 DIAGNOSIS — Z79899 Other long term (current) drug therapy: Secondary | ICD-10-CM

## 2016-10-17 DIAGNOSIS — G40909 Epilepsy, unspecified, not intractable, without status epilepticus: Secondary | ICD-10-CM

## 2016-10-17 DIAGNOSIS — M25512 Pain in left shoulder: Secondary | ICD-10-CM | POA: Diagnosis not present

## 2016-10-17 DIAGNOSIS — E6609 Other obesity due to excess calories: Secondary | ICD-10-CM

## 2016-10-17 DIAGNOSIS — E119 Type 2 diabetes mellitus without complications: Secondary | ICD-10-CM

## 2016-10-17 DIAGNOSIS — M25511 Pain in right shoulder: Secondary | ICD-10-CM

## 2016-10-17 MED ORDER — NALTREXONE-BUPROPION HCL ER 8-90 MG PO TB12: ORAL_TABLET | ORAL | 2 refills | Status: DC

## 2016-10-17 MED ORDER — PHENTERMINE HCL 37.5 MG PO TABS: ORAL_TABLET | ORAL | 3 refills | Status: DC

## 2016-10-17 NOTE — Patient Instructions (Signed)
Simple math prevails.    1st - exercise does not produce significant weight loss - at best one converts fat into muscle , "bulks up", loses inches, but usually stays "weight neutral"     2nd - think of your body weightas a check book: If you eat more calories than you burn up - you save money or gain weight .... Or if you spend more money than you put in the check book, ie burn up more calories than you eat, then you lose weight     3rd - if you walk or run 1 mile, you burn up 100 calories - you have to burn up 3,500 calories to lose 1 pound, ie you have to walk/run 35 miles to lose 1 measly pound. So if you want to lose 10 #, then you have to walk/run 350 miles, so.... clearly exercise is not the solution.     4. So if you consume 1,500 calories, then you have to burn up the equivalent of 15 miles to stay weight neutral - It also stands to reason that if you consume 1,500 cal/day and don't lose weight, then you must be burning up about 1,500 cals/day to stay weight neutral.     5. If you really want to lose weight, you must cut your calorie intake 300 calories /day and at that rate you should lose about 1 # every 3 days.   6. Please purchase Dr Lisa [name]'s book(s) "The End of Dieting" & "Eat to Live" . It has some great concepts and recipes.      

## 2016-10-24 NOTE — Progress Notes (Signed)
LVM for pt to return office call for LAB results.

## 2016-10-29 NOTE — Progress Notes (Signed)
LVM for pt to return office call for LAB results.

## 2016-11-06 ENCOUNTER — Other Ambulatory Visit: Payer: Self-pay | Admitting: Physician Assistant

## 2016-11-07 ENCOUNTER — Other Ambulatory Visit: Payer: Self-pay | Admitting: Physician Assistant

## 2016-12-20 ENCOUNTER — Other Ambulatory Visit: Payer: Self-pay | Admitting: Physician Assistant

## 2016-12-20 MED ORDER — AZITHROMYCIN 250 MG PO TABS
ORAL_TABLET | ORAL | 1 refills | Status: AC
Start: 2016-12-20 — End: 2016-12-25

## 2016-12-20 MED ORDER — PREDNISONE 20 MG PO TABS: ORAL_TABLET | ORAL | 0 refills | Status: DC

## 2016-12-20 MED ORDER — PROMETHAZINE-DM 6.25-15 MG/5ML PO SYRP
5.0000 mL | ORAL_SOLUTION | Freq: Four times a day (QID) | ORAL | 1 refills | Status: DC | PRN
Start: 2016-12-20 — End: 2017-05-08

## 2016-12-24 DIAGNOSIS — B009 Herpesviral infection, unspecified: Secondary | ICD-10-CM | POA: Insufficient documentation

## 2016-12-24 DIAGNOSIS — Z01419 Encounter for gynecological examination (general) (routine) without abnormal findings: Secondary | ICD-10-CM | POA: Diagnosis not present

## 2016-12-24 DIAGNOSIS — Z1231 Encounter for screening mammogram for malignant neoplasm of breast: Secondary | ICD-10-CM | POA: Diagnosis not present

## 2016-12-24 DIAGNOSIS — R87613 High grade squamous intraepithelial lesion on cytologic smear of cervix (HGSIL): Secondary | ICD-10-CM | POA: Diagnosis not present

## 2016-12-25 DIAGNOSIS — R87613 High grade squamous intraepithelial lesion on cytologic smear of cervix (HGSIL): Secondary | ICD-10-CM | POA: Diagnosis not present

## 2017-01-02 LAB — HM MAMMOGRAPHY: HM MAMMO: NORMAL (ref 0–4)

## 2017-01-08 LAB — HM PAP SMEAR: HM PAP: NORMAL

## 2017-02-13 NOTE — Progress Notes (Deleted)
Complete Physical  Assessment and Plan: 1. Diabetes mellitus type II, controlled Has history of DM prior to weight loss, with weight gain will recheck.  ? Benefit from metformin, pending labs will send in  2. Hypothyroidism, unspecified hypothyroidism type Check labs, will refill and recheck  1 month after  3. Seizure disorder Continue valproic acid  4. Depression Depression- continue medications, stress management techniques discussed, increase water, good sleep hygiene discussed, increase exercise, and increase veggies.   5. Obesity Obesity with co morbidities- long discussion about weight loss, diet, and exercise, will start the patient on phentermine- hand out given and AE's discussed, will do close follow up.   6. Other fatigue Fatigue- Discussed lifestyle modification as means of resolving problem, Training modifications discussed, See orders for lab evaluation, Discussed how depression can be a cause of fatigue if all labs are negative will discuss depression treatment. VERSUS sleep apnea, has crowded mouth, mom with OSA, and has prior to sx- may get apnea link  7. Vitamin D deficiency Check vitamin D  8. Hyperlipidemia Patient states she has had elevated in the past and has family history, check lipids, decrease fatty foods, increase activity.   9. Medication management  10.Patient is s/p gastric bypass-  Check labs to do to screen for vitamin deficiencies associated with gastric bypass including Vitamin D, B12, iron. Recommend strict diet and exercise.    Discussed med's effects and SE's. Screening labs and tests as requested with regular follow-up as recommended. Labs done at [facility] are free.   HPI  41 y.o. female  presents for a complete physical.   Her blood pressure has been controlled at home, today their BP is   She does not workout but has a garden and larger yard that she works in.  She denies chest pain, shortness of breath, dizziness.   She {ACTION;  IS/IS OYD:[id number]} on cholesterol medication and denies myalgias. Her cholesterol {ACTION; IS/IS NOT:[id number]} at goal. The cholesterol last visit was:   Lab Results  Component Value Date   CHOL 184 10/15/2016   HDL 67 10/15/2016   LDLCALC 88 10/15/2016   TRIG 144 10/15/2016    She {Has/has not:18111} been working on diet and exercise for prediabetes, and denies {Symptoms; diabetes w/o none:19199}. Last A1C in the office was:  Lab Results  Component Value Date   HGBA1C 6.0 (H) 10/15/2016   Patient is on Vitamin D supplement.   Lab Results  Component Value Date   VD25OH 29.5 (L) 10/15/2016     She is on thyroid medication. Her medication {WAS/WAS NOT:[phone]::"was not"} changed last visit.   Lab Results  Component Value Date   TSH 4.880 (H) 01/18/2016  .  She is on trazodone for sleep, takes PRN, uses once a month.  She is on the valium PRN as well for anxiety.  She is on the [place] ring continuously.  She has had anemia due to the surgery, on iron supplements, has fatigue and muscle aches, if not better may need referral for iron transfusion.  Lab Results  Component Value Date   WBC 8.2 10/15/2016   HGB 9.2 (L) 10/15/2016   HCT 31.2 (L) 10/15/2016   MCV 73 (L) 10/15/2016   PLT 349 10/15/2016   She has had epilepsy since she was 14, on valproic acid 250mg , has not had a seizure in a decade.  Follows with Dr. Tommy Medal.  BMI is There is no height or weight on file to calculate BMI., she is working  on diet and exercise. She is 5 years s/p gastric bypass, she was 260 before the surgery and lowest was 167.  Wt Readings from Last 3 Encounters:  10/17/16 220 lb 6.4 oz (100 kg)  04/30/16 222 lb (100.7 kg)  02/22/16 208 lb 6.4 oz (94.5 kg)     Current Medications:  Current Outpatient Prescriptions on File Prior to Visit  Medication Sig  . diazepam (VALIUM) 5 MG tablet 1/2 to 1 daily as needed for severe anxiety  . etonogestrel-ethinyl estradiol (NUVARING) 0.12-0.015  MG/24HR vaginal ring Insert one per vagina every 21 days for a total of 4 consecutive readings. Take a three-day break and then start with one ring every 21 days for a total of 4 readings again Please note this requires further rings every 3 months  . levothyroxine (SYNTHROID, LEVOTHROID) 300 MCG tablet TAKE 1 TABLET BY MOUTH  DAILY BEFORE BREAKFAST  . Naltrexone-Bupropion HCl ER (CONTRAVE) 8-90 MG TB12 1 pill a morning for 1 week, then 1 pill twice a day for 1 week, 2 pills in the morning and one at night for 1 week, then 2 pill twice a day for 1 week and stay on this dose.  Marland Kitchen omeprazole (PRILOSEC) 20 MG capsule   . phentermine (ADIPEX-P) 37.5 MG tablet TAKE ONE TABLET (37.5 MG TOTAL) BY MOUTH DAILY BEFORE BREAKFAST  . predniSONE (DELTASONE) 20 MG tablet 2 tablets daily for 3 days, 1 tablet daily for 4 days.  . promethazine-dextromethorphan (PROMETHAZINE-DM) 6.25-15 MG/5ML syrup Take 5 mLs by mouth 4 (four) times daily as needed for cough.  . SUMAtriptan (IMITREX) 100 MG tablet Take 1 tablet (100 mg total) by mouth once as needed for migraine. May repeat in 2 hours if headache persists or recurs.  . traZODone (DESYREL) 50 MG tablet TAKE 1 TABLET BY MOUTH AT  BEDTIME  . Valproic Acid 250 MG CPDR Take 2 capsules (500 mg total) by mouth 3 (three) times daily.  Marland Kitchen vortioxetine HBr (BRINTELLIX) 20 MG TABS Take 20 mg by mouth daily.   No current facility-administered medications on file prior to visit.     Health Maintenance:   Tetanus: July 2015 Pneumovax: Flu vaccine: Zostavax: LMP: on nuva ring constantly, last year with OB/GYN Pap: April 2015, due again MGM: has had MGM 2014, + fibrodense  DEXA: Colonoscopy: EGD:  Patient Care Team: Unk Pinto, MD as PCP - General (Internal Medicine)  Medical History:  Past Medical History:  Diagnosis Date  . Abdominal cramping 08/10/2011  . Anxiety   . Asthma   . Depression   . Habitual abortion history, antepartum   . Headache(784.0)   .  History of diabetes mellitus    prior to gastric bypass in 2012  . Hypothyroidism   . Obesity    BMi 33  . Seizures ([place])   . UTI (lower urinary tract infection) 08/14/2011   Allergies Allergies  Allergen Reactions  . Codeine Itching, Rash and Other (See Comments)    OPIOIDS - MORPHINE & RELATED    SURGICAL HISTORY She  has a past surgical history that includes Gastric bypass (2012) and Dilation and curettage of uterus. FAMILY HISTORY Her family history includes Alcohol abuse in her maternal uncle; Alzheimer's disease in her maternal grandfather, maternal grandmother, and paternal grandmother; Anorexia nervosa in her sister; Anxiety disorder in her maternal uncle; Depression in her maternal grandmother, maternal uncle, and mother; Diabetes in her maternal grandmother and mother. SOCIAL HISTORY She  reports that she quit smoking about 17 years  ago. Her smoking use included Cigarettes. She has never used smokeless tobacco. She reports that she drinks alcohol. She reports that she does not use drugs.  Review of Systems: Review of Systems  Constitutional: Positive for malaise/fatigue. Negative for chills, diaphoresis, fever and weight loss.  HENT: Negative.   Respiratory: Negative.   Cardiovascular: Negative.   Gastrointestinal: Positive for diarrhea and heartburn. Negative for abdominal pain, blood in stool, constipation, melena, nausea and vomiting.  Genitourinary: Negative.   Musculoskeletal: Negative.   Skin: Negative.   Neurological: Negative.  Negative for weakness.  Psychiatric/Behavioral: Positive for depression. Negative for hallucinations, memory loss, substance abuse and suicidal ideas. The patient is nervous/anxious and has insomnia.     Physical Exam: Estimated body mass index is 34.01 kg/m as calculated from the following:   Height as of 10/17/16: 5' 7.5" (1.715 m).   Weight as of 10/17/16: 220 lb 6.4 oz (100 kg). There were no vitals taken for this visit. General  Appearance: Well nourished, in no apparent distress.  Eyes: PERRLA, EOMs, conjunctiva no swelling or erythema, normal fundi and vessels.  Sinuses: No Frontal/maxillary tenderness  ENT/Mouth: Ext aud canals clear, normal light reflex with TMs without erythema, bulging. Good dentition. No erythema, swelling, or exudate on post pharynx. Tonsils not swollen or erythematous. Hearing normal. Crowded mouth.  Neck: Supple, thyroid normal. No bruits  Respiratory: Respiratory effort normal, BS equal bilaterally without rales, rhonchi, wheezing or stridor.  Cardio: RRR without murmurs, rubs or gallops. Prominent S2. Brisk peripheral pulses without edema.  Chest: symmetric, with normal excursions and percussion.  Breasts: defer Abdomen: Soft, nontender, no guarding, rebound, hernias, masses, or organomegaly. .  Lymphatics: Non tender without lymphadenopathy.  Genitourinary: defer Musculoskeletal: Full ROM all peripheral extremities,5/5 strength, and normal gait.  Skin: Warm, dry without rashes, lesions, ecchymosis. Neuro: Cranial nerves intact, reflexes equal bilaterally. Normal muscle tone, no cerebellar symptoms. Sensation intact.  Psych: Awake and oriented X 3, normal affect, Insight and Judgment appropriate.    EKG- declines at this time.   Vicie Mutters 7:39 AM [facility]

## 2017-02-14 ENCOUNTER — Encounter: Payer: Self-pay | Admitting: Physician Assistant

## 2017-03-04 ENCOUNTER — Other Ambulatory Visit: Payer: Self-pay | Admitting: Physician Assistant

## 2017-03-20 ENCOUNTER — Encounter: Payer: Self-pay | Admitting: Internal Medicine

## 2017-03-29 ENCOUNTER — Other Ambulatory Visit: Payer: Self-pay | Admitting: Physician Assistant

## 2017-03-29 DIAGNOSIS — Z79899 Other long term (current) drug therapy: Secondary | ICD-10-CM | POA: Diagnosis not present

## 2017-03-29 DIAGNOSIS — E039 Hypothyroidism, unspecified: Secondary | ICD-10-CM | POA: Diagnosis not present

## 2017-03-29 DIAGNOSIS — E785 Hyperlipidemia, unspecified: Secondary | ICD-10-CM | POA: Diagnosis not present

## 2017-03-30 LAB — CBC WITH DIFFERENTIAL/PLATELET
BASOS ABS: 0 10*3/uL (ref 0.0–0.2)
Basos: 1 %
EOS (ABSOLUTE): 0.5 10*3/uL — AB (ref 0.0–0.4)
Eos: 7 %
HEMATOCRIT: 32.4 % — AB (ref 34.0–46.6)
Hemoglobin: 9.9 g/dL — ABNORMAL LOW (ref 11.1–15.9)
Immature Grans (Abs): 0 10*3/uL (ref 0.0–0.1)
Immature Granulocytes: 0 %
LYMPHS: 27 %
Lymphocytes Absolute: 1.9 10*3/uL (ref 0.7–3.1)
MCH: 22 pg — AB (ref 26.6–33.0)
MCHC: 30.6 g/dL — ABNORMAL LOW (ref 31.5–35.7)
MCV: 72 fL — AB (ref 79–97)
Monocytes Absolute: 0.9 10*3/uL (ref 0.1–0.9)
Monocytes: 13 %
NEUTROS ABS: 3.6 10*3/uL (ref 1.4–7.0)
Neutrophils: 52 %
PLATELETS: 436 10*3/uL — AB (ref 150–379)
RBC: 4.5 x10E6/uL (ref 3.77–5.28)
RDW: 17.5 % — ABNORMAL HIGH (ref 12.3–15.4)
WBC: 6.8 10*3/uL (ref 3.4–10.8)

## 2017-03-30 LAB — IRON AND TIBC
IRON SATURATION: 2 % — AB (ref 15–55)
Iron: 16 ug/dL — ABNORMAL LOW (ref 27–159)
Total Iron Binding Capacity: 654 ug/dL (ref 250–450)
UIBC: 638 ug/dL — ABNORMAL HIGH (ref 131–425)

## 2017-03-30 LAB — BASIC METABOLIC PANEL
BUN / CREAT RATIO: 10 (ref 9–23)
BUN: 8 mg/dL (ref 6–24)
CALCIUM: 8.5 mg/dL — AB (ref 8.7–10.2)
CHLORIDE: 103 mmol/L (ref 96–106)
CO2: 20 mmol/L (ref 20–29)
CREATININE: 0.78 mg/dL (ref 0.57–1.00)
GFR calc Af Amer: 110 mL/min/{1.73_m2} (ref >59)
GFR calc non Af Amer: 95 mL/min/{1.73_m2} (ref >59)
GLUCOSE: 157 mg/dL — AB (ref 65–99)
Potassium: 4.2 mmol/L (ref 3.5–5.2)
Sodium: 140 mmol/L (ref 134–144)

## 2017-03-30 LAB — HEPATIC FUNCTION PANEL
ALBUMIN: 4.2 g/dL (ref 3.5–5.5)
ALK PHOS: 56 IU/L (ref 39–117)
ALT: 24 IU/L (ref 0–32)
AST: 22 IU/L (ref 0–40)
BILIRUBIN TOTAL: 0.2 mg/dL (ref 0.0–1.2)
BILIRUBIN, DIRECT: 0.09 mg/dL (ref 0.00–0.40)
TOTAL PROTEIN: 7 g/dL (ref 6.0–8.5)

## 2017-03-30 LAB — FERRITIN: FERRITIN: 8 ng/mL — AB (ref 15–150)

## 2017-03-30 LAB — HGB A1C W/O EAG: HEMOGLOBIN A1C: 7 % — AB (ref 4.8–5.6)

## 2017-03-30 LAB — MAGNESIUM: MAGNESIUM: 2.2 mg/dL (ref 1.6–2.3)

## 2017-03-30 LAB — VALPROIC ACID LEVEL: Valproic Acid Lvl: 10 ug/mL — ABNORMAL LOW (ref 50–100)

## 2017-03-30 LAB — TSH: TSH: 1.29 u[IU]/mL (ref 0.450–4.500)

## 2017-03-30 LAB — CHOLESTEROL, TOTAL: Cholesterol, Total: 186 mg/dL (ref 100–199)

## 2017-04-01 ENCOUNTER — Other Ambulatory Visit: Payer: Self-pay | Admitting: Physician Assistant

## 2017-04-01 DIAGNOSIS — D508 Other iron deficiency anemias: Secondary | ICD-10-CM

## 2017-04-01 NOTE — Progress Notes (Signed)
LVM for pt to return office call for LAB results.

## 2017-04-08 ENCOUNTER — Other Ambulatory Visit: Payer: Self-pay

## 2017-04-08 ENCOUNTER — Other Ambulatory Visit: Payer: Self-pay | Admitting: Physician Assistant

## 2017-04-08 MED ORDER — METFORMIN HCL ER 500 MG PO TB24
ORAL_TABLET | ORAL | 3 refills | Status: DC
Start: 2017-04-08 — End: 2017-04-29

## 2017-04-08 MED ORDER — AZITHROMYCIN 250 MG PO TABS: ORAL_TABLET | ORAL | 1 refills | Status: AC

## 2017-04-08 MED ORDER — VARENICLINE TARTRATE 1 MG PO TABS: 1.0000 mg | ORAL_TABLET | Freq: Two times a day (BID) | ORAL | 1 refills | Status: DC

## 2017-04-08 NOTE — Progress Notes (Signed)
Pt aware of lab results & voiced understanding of those results. Pt was transferred to front to schedule DM teaching. Pt also aware of hematology referral.

## 2017-04-11 ENCOUNTER — Telehealth: Payer: Self-pay | Admitting: Internal Medicine

## 2017-04-11 NOTE — Telephone Encounter (Signed)
Prior Josem Kaufmann has been pulled per pt's request.

## 2017-04-11 NOTE — Telephone Encounter (Signed)
Patient called to check status of Hematology referral, gave her number and advised their scheduler will be calling her. Please do not run Ch[employer] on her insurance. She stated she will talk to pharmacy about cash pay.

## 2017-04-22 ENCOUNTER — Telehealth: Payer: Self-pay | Admitting: Physician Assistant

## 2017-04-22 MED ORDER — BENZONATATE 100 MG PO CAPS: 100.0000 mg | ORAL_CAPSULE | Freq: Three times a day (TID) | ORAL | 0 refills | Status: DC | PRN

## 2017-04-22 MED ORDER — PREDNISONE 20 MG PO TABS: ORAL_TABLET | ORAL | 0 refills | Status: DC

## 2017-04-22 NOTE — Telephone Encounter (Signed)
41 y.o. smoking WF, treated with prednisone/zpak 1 month ago calls with continuing sore throat/laryngitis, sinus drainage and cough.   No fever, chills, no SOB/whezing.   Resend in prednisone, get on zantac 150mg  BID with omeprazole Voice rest Salt water gargles Sugar free hard candy during the day Need to quiet cough, will send in tessalon caps for you to take.   If any fever, chills will send in ABX.   Did she get set up with hematology and did she get her last labs?

## 2017-04-22 NOTE — Telephone Encounter (Signed)
Pt was made aware of lab results in Oct but I did double check with her. Pt states no one has called to set up the hematology appt yet either. Pt also aware of Rx that has been sent to pharmacy.

## 2017-04-23 ENCOUNTER — Other Ambulatory Visit: Payer: Self-pay | Admitting: Physician Assistant

## 2017-04-24 NOTE — Telephone Encounter (Signed)
Left  messagr to call the office to advise on Hematology facility.

## 2017-04-29 ENCOUNTER — Telehealth: Payer: Self-pay | Admitting: Hematology

## 2017-04-29 ENCOUNTER — Other Ambulatory Visit: Payer: Self-pay

## 2017-04-29 ENCOUNTER — Other Ambulatory Visit: Payer: Self-pay | Admitting: Physician Assistant

## 2017-04-29 MED ORDER — METFORMIN HCL ER 500 MG PO TB24: ORAL_TABLET | ORAL | 3 refills | Status: DC

## 2017-04-29 NOTE — Telephone Encounter (Signed)
Patient returned call this morning and was given new patient appointments to see Dr. Irene Limbo 11/7 @ 11 am. Patient given date/time/location.

## 2017-05-01 ENCOUNTER — Ambulatory Visit: Payer: 59 | Admitting: Hematology

## 2017-05-01 ENCOUNTER — Encounter: Payer: Self-pay | Admitting: Hematology

## 2017-05-01 VITALS — BP 132/79 | HR 98 | Temp 98.8°F | Resp 18 | Ht 67.5 in | Wt 225.0 lb

## 2017-05-01 DIAGNOSIS — Z9884 Bariatric surgery status: Secondary | ICD-10-CM | POA: Diagnosis not present

## 2017-05-01 DIAGNOSIS — E538 Deficiency of other specified B group vitamins: Secondary | ICD-10-CM

## 2017-05-01 DIAGNOSIS — D509 Iron deficiency anemia, unspecified: Secondary | ICD-10-CM | POA: Diagnosis not present

## 2017-05-01 NOTE — Progress Notes (Signed)
HEMATOLOGY/ONCOLOGY CONSULTATION NOTE  Date of Service: 05/01/2017  Patient Care Team: Unk Pinto, MD as PCP - General (Internal Medicine)  CHIEF COMPLAINTS/PURPOSE OF CONSULTATION:  Iron deficiency anemia   HISTORY OF PRESENTING ILLNESS:   Lisa [name] is a wonderful 41 y.o. female who has been referred to [place] by Dr Melford Aase, Gwyndolyn Saxon, MD for evaluation and management of iron deficiency.   She has a history of obesity and DM and had a gastric bypass surgery 6 years ago. Her recent labs with her PCP as of 03/29/2017.showed ferritin level 8. hgb 9.9. Platelets 436k.   She states she is doing well overall but has being more fatigued. She reports being anemic since her bypass surgery 6 years ago. She states that she is taking daily OTC multi-vitamin 2x daily and ferrous sulfate 2x daily. She tolerates it well but she states it does cause mild constipation which is alleviated by stool softeners. She reports 2 heavy menstrual losses in the past 6 months and irregular cycle. She voices she would like to have labs done through [facility] due to cost coverage as an employee.  She was noted to have iron deficiency with ferritin of around 10 or less for >1 yr despite being on ferrous sulfate twice daily. She is on PPI's which also has affected her iron absorption.  On review of systems, pt reports severe fatigue, heavy menstrual losses and denies bloody stools, black stools, fever, chills, night sweats and any other accompanying symptoms.   MEDICAL HISTORY:  Past Medical History:  Diagnosis Date  . Abdominal cramping 08/10/2011  . Anxiety   . Asthma   . Depression   . Habitual abortion history, antepartum   . Headache(784.0)   . History of diabetes mellitus    prior to gastric bypass in 2012  . Hypothyroidism   . Obesity    BMi 33  . Seizures ([place])   . UTI (lower urinary tract infection) 08/14/2011    SURGICAL HISTORY: Past Surgical History:  Procedure Laterality Date  . DILATION  AND CURETTAGE OF UTERUS    . GASTRIC BYPASS  2012    SOCIAL HISTORY: Social History   Socioeconomic History  . Marital status: Married    Spouse name: Not on file  . Number of children: Not on file  . Years of education: Not on file  . Highest education level: Not on file  Social Needs  . Financial resource strain: Not on file  . Food insecurity - worry: Not on file  . Food insecurity - inability: Not on file  . Transportation needs - medical: Not on file  . Transportation needs - non-medical: Not on file  Occupational History  . Not on file  Tobacco Use  . Smoking status: Former Smoker    Types: Cigarettes    Last attempt to quit: 11/12/1999    Years since quitting: 17.4  . Smokeless tobacco: Never Used  Substance and Sexual Activity  . Alcohol use: Yes    Alcohol/week: 1.2 oz    Types: 2 Cans of beer per week    Comment: weekly  . Drug use: No  . Sexual activity: Yes    Birth control/protection: Other-see comments    Comment: nuva ring  Other Topics Concern  . Not on file  Social History Narrative  . Not on file    FAMILY HISTORY: Family History  Problem Relation Age of Onset  . Depression Mother   . Diabetes Mother   . Anorexia nervosa Sister   .  Alcohol abuse Maternal Uncle   . Anxiety disorder Maternal Uncle   . Depression Maternal Uncle   . Alzheimer's disease Maternal Grandfather   . Depression Maternal Grandmother   . Alzheimer's disease Maternal Grandmother   . Diabetes Maternal Grandmother   . Alzheimer's disease Paternal Grandmother     ALLERGIES:  is allergic to codeine.  MEDICATIONS:  Current Outpatient Medications  Medication Sig Dispense Refill  . benzonatate (TESSALON PERLES) 100 MG capsule Take 1 capsule (100 mg total) by mouth 3 (three) times daily as needed for cough (Max: 600mg  per day). 60 capsule 0  . diazepam (VALIUM) 5 MG tablet 1/2 to 1 daily as needed for severe anxiety 30 tablet 0  . etonogestrel-ethinyl estradiol (NUVARING)  0.12-0.015 MG/24HR vaginal ring Insert one per vagina every 21 days for a total of 4 consecutive readings. Take a three-day break and then start with one ring every 21 days for a total of 4 readings again Please note this requires further rings every 3 months 4 each 3  . levothyroxine (SYNTHROID, LEVOTHROID) 300 MCG tablet TAKE 1 TABLET BY MOUTH  DAILY BEFORE BREAKFAST 90 tablet 3  . metFORMIN (GLUCOPHAGE XR) 500 MG 24 hr tablet 1-2 times daily with largest meal, start on 1 and can go up to 2 a day. 60 tablet 3  . omeprazole (PRILOSEC) 20 MG capsule     . phentermine (ADIPEX-P) 37.5 MG tablet TAKE ONE TABLET ( 37.5 MG TOTAL) BY MOUTH DAILY BEFORE BREAKFAST 30 tablet 3  . promethazine-dextromethorphan (PROMETHAZINE-DM) 6.25-15 MG/5ML syrup Take 5 mLs by mouth 4 (four) times daily as needed for cough. 240 mL 1  . traZODone (DESYREL) 50 MG tablet TAKE 1 TABLET BY MOUTH AT  BEDTIME 90 tablet 1  . Valproic Acid 250 MG CPDR Take 2 capsules (500 mg total) by mouth 3 (three) times daily. 540 capsule 4  . SUMAtriptan (IMITREX) 100 MG tablet Take 1 tablet (100 mg total) by mouth once as needed for migraine. May repeat in 2 hours if headache persists or recurs. 10 tablet 0   No current facility-administered medications for this visit.     REVIEW OF SYSTEMS:    10 Point review of Systems was done is negative except as noted above.  PHYSICAL EXAMINATION: ECOG PERFORMANCE STATUS: 1 - Symptomatic but completely ambulatory  . Vitals:   05/01/17 1049  BP: 132/79  Pulse: 98  Resp: 18  Temp: 98.8 F (37.1 C)  SpO2: 100%   Filed Weights   05/01/17 1049  Weight: 225 lb (102.1 kg)   .Body mass index is 34.72 kg/m.  GENERAL:alert, in no acute distress and comfortable SKIN: no acute rashes, no significant lesions EYES: conjunctiva are pink and non-injected, sclera anicteric OROPHARYNX: MMM, no exudates, no oropharyngeal erythema or ulceration NECK: supple, no JVD LYMPH:  no palpable  lymphadenopathy in the cervical, axillary or inguinal regions LUNGS: clear to auscultation b/l with normal respiratory effort HEART: regular rate & rhythm ABDOMEN:  normoactive bowel sounds , non tender, not distended. Extremity: no pedal edema PSYCH: alert & oriented x 3 with fluent speech NEURO: no focal motor/sensory deficits  LABORATORY DATA:  I have reviewed the data as listed  . CBC Latest Ref Rng & Units 03/29/2017 10/15/2016 01/18/2016  WBC 3.4 - 10.8 x10E3/uL 6.8 8.2 5.8  Hemoglobin 11.1 - 15.9 g/dL 9.9(L) 9.2(L) 10.2(L)  Hematocrit 34.0 - 46.6 % 32.4(L) 31.2(L) 33.2(L)  Platelets 150 - 379 x10E3/uL 436(H) 349 387(H)   . CBC  Component Value Date/Time   WBC 6.8 03/29/2017 1052   RBC 4.50 03/29/2017 1052   HGB 9.9 (L) 03/29/2017 1052   HCT 32.4 (L) 03/29/2017 1052   PLT 436 (H) 03/29/2017 1052   MCV 72 (L) 03/29/2017 1052   MCH 22.0 (L) 03/29/2017 1052   MCHC 30.6 (L) 03/29/2017 1052   RDW 17.5 (H) 03/29/2017 1052   LYMPHSABS 1.9 03/29/2017 1052   EOSABS 0.5 (H) 03/29/2017 1052   BASOSABS 0.0 03/29/2017 1052    . CMP Latest Ref Rng & Units 03/29/2017 10/15/2016 01/18/2016  Glucose 65 - 99 mg/dL 157(H) 101(H) 142(H)  BUN 6 - 24 mg/dL 8 10 11   Creatinine 0.57 - 1.00 mg/dL 0.78 0.71 0.77  Sodium 134 - 144 mmol/L 140 143 139  Potassium 3.5 - 5.2 mmol/L 4.2 4.9 4.2  Chloride 96 - 106 mmol/L 103 105 99  CO2 20 - 29 mmol/L 20 21 20   Calcium 8.7 - 10.2 mg/dL 8.5(L) 9.2 9.3  Total Protein 6.0 - 8.5 g/dL 7.0 - 6.8  Total Bilirubin 0.0 - 1.2 mg/dL 0.2 - 0.4  Alkaline Phos 39 - 117 IU/L 56 - 48  AST 0 - 40 IU/L 22 - 61(H)  ALT 0 - 32 IU/L 24 - 55(H)     RADIOGRAPHIC STUDIES: I have personally reviewed the radiological images as listed and agreed with the findings in the report. No results found.  ASSESSMENT & PLAN:  Lisa [name] is a wonderful 41 y.o. female   1) .Moderate microcytic Anemia due to Iron deficiency   2) Severe Iron deficiency anemia - not  responsive to PO iron -Discussed possible risk factors (bypass surgery, heavy menstrual loss,chronic PPI)   3) Fatigue due to Iron deficiency  PLAN -discussed all available lab results with the patient and all her questions were answered in details. -her iron deficiency has been refractory to adequate po iron intake for >1 year likely due to poor po absorption -we discussed pros and cons and possible adverse effects from IV Iron - patient informed consent obtained -will setup for  IV injectafer 750mg  IV weekly x 2 doses -will get labs today and 8 weeks at [place]- patient has been given prescription  -patient to continue B complex and B12 replacement   IV Injectafer weekly x 2 doses RTC with Dr Irene Limbo in 8 weeks  (Labs today and 8 weeks at [place] - patient has prescription)  All of the patients questions were answered with apparent satisfaction. The patient knows to call the clinic with any problems, questions or concerns.  I spent 40 minutes counseling the patient face to face. The total time spent in the appointment was 50 minutes and more than 50% was on counseling and direct patient cares.  .I have reviewed the above documentation for accuracy and completeness, and I agree with the above.    Sullivan Lone MD [place] AAHIVMS [facility] Hematology/Oncology Physician [facility]  (Office):       [phone] (Work cell):  [phone] (Fax):           [phone]  05/01/2017 11:59 AM    This document serves as a record of services personally performed by Sullivan Lone, MD. It was created on his behalf by Alean Rinne, a trained medical scribe. The creation of this record is based on the scribe's personal observations and the provider's statements to them.

## 2017-05-07 NOTE — Progress Notes (Signed)
Diabetes Education and Follow-Up Visit  41 y.o.female presents for diabetic education. She has Diabetes Mellitus type 2:  without complications, she is not on bASA, and denies paresthesia of the feet, polydipsia, polyuria and visual disturbances. She has had SEVERE iron def, sent to hematology for evaluation/iron transfusion, getting today, hoping this will help with energy and possible help control eating more. Stopped contrave.  Last hemoglobin A1c was: Lab Results  Component Value Date   HGBA1C 7.0 (H) 03/29/2017   HGBA1C 6.0 (H) 10/15/2016   HGBA1C 5.7 (H) 01/18/2016    Body mass index is 34.6 kg/m.  Pt is on a regimen of: Metformin ER but has not had for 2 months  She does not check her sugars.   Exercise: Works out in the yard but too tired to work out at this time.   Patient does not have CKD She is not on ACE/ARB  Lab Results  Component Value Date   GFRNONAA 95 03/29/2017   Lab Results  Component Value Date   CREATININE 0.78 03/29/2017   BUN 8 03/29/2017   NA 140 03/29/2017   K 4.2 03/29/2017   CL 103 03/29/2017   CO2 20 03/29/2017   Need microalbumin next OV  She is not on a Statin.  She is not at goal of less than 70.  Lab Results  Component Value Date   CHOL 186 03/29/2017   HDL 67 10/15/2016   LDLCALC 88 10/15/2016   TRIG 144 10/15/2016   Problem List has Depression; PTSD (post-traumatic stress disorder); Obesity surgery status; History of recurrent miscarriages, not currently pregnant; Seizure disorder ([place]); Female infertility; Diabetes mellitus type II, controlled ([place]); Hypothyroidism; Panic attacks; Dissociative identity disorder [facility]); Anxiety; Infertility associated with anovulation; Obesity; Anemia; Vitamin D deficiency; Hyperlipidemia LDL goal <100; Medication management; and Iron deficiency anemia on their problem list.  Medications Current Outpatient Medications on File Prior to Visit  Medication Sig  . diazepam (VALIUM) 5 MG tablet 1/2  to 1 daily as needed for severe anxiety  . etonogestrel-ethinyl estradiol (NUVARING) 0.12-0.015 MG/24HR vaginal ring Insert one per vagina every 21 days for a total of 4 consecutive readings. Take a three-day break and then start with one ring every 21 days for a total of 4 readings again Please note this requires further rings every 3 months  . levothyroxine (SYNTHROID, LEVOTHROID) 300 MCG tablet TAKE 1 TABLET BY MOUTH  DAILY BEFORE BREAKFAST  . metFORMIN (GLUCOPHAGE XR) 500 MG 24 hr tablet 1-2 times daily with largest meal, start on 1 and can go up to 2 a day.  Marland Kitchen omeprazole (PRILOSEC) 20 MG capsule   . phentermine (ADIPEX-P) 37.5 MG tablet TAKE ONE TABLET ( 37.5 MG TOTAL) BY MOUTH DAILY BEFORE BREAKFAST  . traZODone (DESYREL) 50 MG tablet TAKE 1 TABLET BY MOUTH AT  BEDTIME  . Valproic Acid 250 MG CPDR Take 2 capsules (500 mg total) by mouth 3 (three) times daily.  . SUMAtriptan (IMITREX) 100 MG tablet Take 1 tablet (100 mg total) by mouth once as needed for migraine. May repeat in 2 hours if headache persists or recurs.   No current facility-administered medications on file prior to visit.     ROS- see HPI  Physical Exam: Blood pressure 118/76, pulse 96, temperature (!) 97.4 F (36.3 C), resp. rate 16, height 5' 7.5" (1.715 m), weight 224 lb 3.2 oz (101.7 kg), SpO2 98 %. Body mass index is 34.6 kg/m. General Appearance: Well nourished, in no apparent distress. Eyes: PERRLA,  EOMs, conjunctiva no swelling or erythema ENT/Mouth: Ext aud canals clear, TMs without erythema, bulging. No erythema, swelling, or exudate on post pharynx.  Tonsils not swollen or erythematous. Hearing normal.  Respiratory: Respiratory effort normal, BS equal bilaterally without rales, rhonchi, wheezing or stridor.  Cardio: RRR with no MRGs. Brisk peripheral pulses without edema.  Abdomen: Soft, + BS.  Non tender, no guarding, rebound, hernias, masses. Musculoskeletal: Full ROM, 5/5 strength, normal gait.  Skin:  Warm, dry without rashes, lesions, ecchymosis.  Neuro: Cranial nerves intact. Normal muscle tone, no cerebellar symptoms. Sensation intact.    Plan and Assessment: Diabetes Education: Reviewed 'ABCs' of diabetes management (respective goals in parentheses):  A1C (<7), blood pressure (<130/80), and cholesterol (LDL <70) Eye Exam yearly and Dental Exam every 6 months. Dietary recommendations Physical Activity recommendations - Strongly advised her to start checking sugars at different times of the day - check 2 times a day, rotating checks - given sugar log and advised how to fill it and to bring it at next appt  - given foot care handout and explained the principles  - given instructions for hypoglycemia management   Anemia severe Follow first week in Jan with hematology, getting 2 transfusions, Dr. Irene Limbo Will see if this helps with energy and weight loss and follow up AFTER transfusions  Hoarseness Salt water gargles Hot tea with honey VOICE REST Sugar free candy  Future Appointments  Date Time Provider Aneth  05/08/2017 12:30 PM [facility] F21 [facility] None  05/15/2017  8:00 AM [facility] I25 DNS [facility] None  06/19/2017  3:45 PM Vicie Mutters, PA-C [facility] None  06/27/2017  9:00 AM Brunetta Genera, MD [facility] None

## 2017-05-08 ENCOUNTER — Telehealth: Payer: Self-pay

## 2017-05-08 ENCOUNTER — Ambulatory Visit (INDEPENDENT_AMBULATORY_CARE_PROVIDER_SITE_OTHER): Payer: 59 | Admitting: Physician Assistant

## 2017-05-08 ENCOUNTER — Ambulatory Visit (HOSPITAL_BASED_OUTPATIENT_CLINIC_OR_DEPARTMENT_OTHER): Payer: 59

## 2017-05-08 ENCOUNTER — Encounter: Payer: Self-pay | Admitting: Physician Assistant

## 2017-05-08 VITALS — BP 121/66 | HR 84 | Temp 98.7°F | Resp 18

## 2017-05-08 VITALS — BP 118/76 | HR 96 | Temp 97.4°F | Resp 16 | Ht 67.5 in | Wt 224.2 lb

## 2017-05-08 DIAGNOSIS — Z79899 Other long term (current) drug therapy: Secondary | ICD-10-CM | POA: Diagnosis not present

## 2017-05-08 DIAGNOSIS — D509 Iron deficiency anemia, unspecified: Secondary | ICD-10-CM

## 2017-05-08 DIAGNOSIS — E039 Hypothyroidism, unspecified: Secondary | ICD-10-CM

## 2017-05-08 DIAGNOSIS — E119 Type 2 diabetes mellitus without complications: Secondary | ICD-10-CM

## 2017-05-08 MED ORDER — ACETAMINOPHEN 325 MG PO TABS
650.0000 mg | ORAL_TABLET | Freq: Once | ORAL | Status: AC
  Administered 2017-05-08: 650 mg via ORAL

## 2017-05-08 MED ORDER — DIPHENHYDRAMINE HCL 25 MG PO TABS
25.0000 mg | ORAL_TABLET | Freq: Once | ORAL | Status: AC
  Administered 2017-05-08: 25 mg via ORAL
  Filled 2017-05-08: qty 1

## 2017-05-08 MED ORDER — SODIUM CHLORIDE 0.9 % IV SOLN
750.0000 mg | Freq: Once | INTRAVENOUS | Status: AC
  Administered 2017-05-08: 750 mg via INTRAVENOUS
  Filled 2017-05-08: qty 15

## 2017-05-08 MED ORDER — SODIUM CHLORIDE 0.9 % IV SOLN
INTRAVENOUS | Status: DC
  Administered 2017-05-08: 13:00:00 via INTRAVENOUS

## 2017-05-08 MED ORDER — ACETAMINOPHEN 325 MG PO TABS
ORAL_TABLET | ORAL | Status: AC
  Filled 2017-05-08: qty 2

## 2017-05-08 MED ORDER — DIPHENHYDRAMINE HCL 25 MG PO CAPS
ORAL_CAPSULE | ORAL | Status: AC
  Filled 2017-05-08: qty 2

## 2017-05-08 NOTE — Patient Instructions (Signed)
Ferric carboxymaltose injection (Injectafer) What is this medicine? FERRIC CARBOXYMALTOSE (ferr-ik car-box-ee-mol-toes) is an iron complex. Iron is used to make healthy red blood cells, which carry oxygen and nutrients throughout the body. This medicine is used to treat anemia in people with chronic kidney disease or people who cannot take iron by mouth. This medicine may be used for other purposes; ask your health care provider or pharmacist if you have questions. COMMON BRAND NAME(S): Injectafer What should I tell my health care provider before I take this medicine? They need to know if you have any of these conditions: -anemia not caused by low iron levels -high levels of iron in the blood -liver disease -an unusual or allergic reaction to iron, other medicines, foods, dyes, or preservatives -pregnant or trying to get pregnant -breast-feeding How should I use this medicine? This medicine is for infusion into a vein. It is given by a health care professional in a hospital or clinic setting. Talk to your pediatrician regarding the use of this medicine in children. Special care may be needed. Overdosage: If you think you have taken too much of this medicine contact a poison control center or emergency room at once. NOTE: This medicine is only for you. Do not share this medicine with others. What if I miss a dose? It is important not to miss your dose. Call your doctor or health care professional if you are unable to keep an appointment. What may interact with this medicine? Do not take this medicine with any of the following medications: -deferoxamine -dimercaprol -other iron products This medicine may also interact with the following medications: -chloramphenicol -deferasirox This list may not describe all possible interactions. Give your health care provider a list of all the medicines, herbs, non-prescription drugs, or dietary supplements you use. Also tell them if you smoke, drink  alcohol, or use illegal drugs. Some items may interact with your medicine. What should I watch for while using this medicine? Visit your doctor or health care professional regularly. Tell your doctor if your symptoms do not start to get better or if they get worse. You may need blood work done while you are taking this medicine. You may need to follow a special diet. Talk to your doctor. Foods that contain iron include: whole grains/cereals, dried fruits, beans, or peas, leafy green vegetables, and organ meats (liver, kidney). What side effects may I notice from receiving this medicine? Side effects that you should report to your doctor or health care professional as soon as possible: -allergic reactions like skin rash, itching or hives, swelling of the face, lips, or tongue -breathing problems -changes in blood pressure -feeling faint or lightheaded, falls -flushing, sweating, or hot feelings Side effects that usually do not require medical attention (report to your doctor or health care professional if they continue or are bothersome): -changes in taste -constipation -dizziness -headache -nausea -pain, redness, or irritation at site where injected -vomiting This list may not describe all possible side effects. Call your doctor for medical advice about side effects. You may report side effects to FDA at 1-800-FDA-1088. Where should I keep my medicine? This drug is given in a hospital or clinic and will not be stored at home. NOTE: This sheet is a summary. It may not cover all possible information. If you have questions about this medicine, talk to your doctor, pharmacist, or health care provider.  2018 Elsevier/Gold Standard (2015-07-14 11:20:47)  

## 2017-05-08 NOTE — Progress Notes (Signed)
Pt tolerated the infusion well pt waited 30 mins post infusion vital signs stable.

## 2017-05-08 NOTE — Patient Instructions (Addendum)
Salt water gargles Hot tea with honey VOICE REST Sugar free candy

## 2017-05-08 NOTE — Telephone Encounter (Signed)
Patient declined avs and printout at this time. Per 11/14 los

## 2017-05-15 ENCOUNTER — Ambulatory Visit (HOSPITAL_BASED_OUTPATIENT_CLINIC_OR_DEPARTMENT_OTHER): Payer: 59

## 2017-05-15 ENCOUNTER — Ambulatory Visit: Payer: Self-pay

## 2017-05-15 VITALS — BP 148/87 | HR 95 | Temp 98.5°F | Resp 16

## 2017-05-15 DIAGNOSIS — D509 Iron deficiency anemia, unspecified: Secondary | ICD-10-CM | POA: Diagnosis not present

## 2017-05-15 MED ORDER — SODIUM CHLORIDE 0.9 % IV SOLN
750.0000 mg | Freq: Once | INTRAVENOUS | Status: AC
  Administered 2017-05-15: 750 mg via INTRAVENOUS
  Filled 2017-05-15: qty 15

## 2017-05-15 MED ORDER — ACETAMINOPHEN 325 MG PO TABS
ORAL_TABLET | ORAL | Status: AC
  Filled 2017-05-15: qty 2

## 2017-05-15 MED ORDER — SODIUM CHLORIDE 0.9 % IV SOLN
Freq: Once | INTRAVENOUS | Status: AC
  Administered 2017-05-15: 15:00:00 via INTRAVENOUS

## 2017-05-15 MED ORDER — ACETAMINOPHEN 325 MG PO TABS
650.0000 mg | ORAL_TABLET | Freq: Once | ORAL | Status: AC
  Administered 2017-05-15: 650 mg via ORAL

## 2017-05-15 MED ORDER — DIPHENHYDRAMINE HCL 25 MG PO TABS
25.0000 mg | ORAL_TABLET | Freq: Once | ORAL | Status: DC
Start: 2017-05-15 — End: 2017-05-15
  Filled 2017-05-15: qty 1

## 2017-05-15 NOTE — Patient Instructions (Signed)
Ferric carboxymaltose injection (Injectafer) What is this medicine? FERRIC CARBOXYMALTOSE (ferr-ik car-box-ee-mol-toes) is an iron complex. Iron is used to make healthy red blood cells, which carry oxygen and nutrients throughout the body. This medicine is used to treat anemia in people with chronic kidney disease or people who cannot take iron by mouth. This medicine may be used for other purposes; ask your health care provider or pharmacist if you have questions. COMMON BRAND NAME(S): Injectafer What should I tell my health care provider before I take this medicine? They need to know if you have any of these conditions: -anemia not caused by low iron levels -high levels of iron in the blood -liver disease -an unusual or allergic reaction to iron, other medicines, foods, dyes, or preservatives -pregnant or trying to get pregnant -breast-feeding How should I use this medicine? This medicine is for infusion into a vein. It is given by a health care professional in a hospital or clinic setting. Talk to your pediatrician regarding the use of this medicine in children. Special care may be needed. Overdosage: If you think you have taken too much of this medicine contact a poison control center or emergency room at once. NOTE: This medicine is only for you. Do not share this medicine with others. What if I miss a dose? It is important not to miss your dose. Call your doctor or health care professional if you are unable to keep an appointment. What may interact with this medicine? Do not take this medicine with any of the following medications: -deferoxamine -dimercaprol -other iron products This medicine may also interact with the following medications: -chloramphenicol -deferasirox This list may not describe all possible interactions. Give your health care provider a list of all the medicines, herbs, non-prescription drugs, or dietary supplements you use. Also tell them if you smoke, drink  alcohol, or use illegal drugs. Some items may interact with your medicine. What should I watch for while using this medicine? Visit your doctor or health care professional regularly. Tell your doctor if your symptoms do not start to get better or if they get worse. You may need blood work done while you are taking this medicine. You may need to follow a special diet. Talk to your doctor. Foods that contain iron include: whole grains/cereals, dried fruits, beans, or peas, leafy green vegetables, and organ meats (liver, kidney). What side effects may I notice from receiving this medicine? Side effects that you should report to your doctor or health care professional as soon as possible: -allergic reactions like skin rash, itching or hives, swelling of the face, lips, or tongue -breathing problems -changes in blood pressure -feeling faint or lightheaded, falls -flushing, sweating, or hot feelings Side effects that usually do not require medical attention (report to your doctor or health care professional if they continue or are bothersome): -changes in taste -constipation -dizziness -headache -nausea -pain, redness, or irritation at site where injected -vomiting This list may not describe all possible side effects. Call your doctor for medical advice about side effects. You may report side effects to FDA at 1-800-FDA-1088. Where should I keep my medicine? This drug is given in a hospital or clinic and will not be stored at home. NOTE: This sheet is a summary. It may not cover all possible information. If you have questions about this medicine, talk to your doctor, pharmacist, or health care provider.  2018 Elsevier/Gold Standard (2015-07-14 11:20:47)  

## 2017-05-15 NOTE — Progress Notes (Signed)
Pt requested to not have Benadryl 25mg  PO pre-med today, no hx rxn to injectafer. Per Dr Irene Limbo ok to omit the Benadryl 25mg  PO today.

## 2017-05-24 DIAGNOSIS — Z23 Encounter for immunization: Secondary | ICD-10-CM | POA: Diagnosis not present

## 2017-06-17 ENCOUNTER — Other Ambulatory Visit: Payer: Self-pay | Admitting: Physician Assistant

## 2017-06-19 ENCOUNTER — Encounter: Payer: Self-pay | Admitting: Physician Assistant

## 2017-06-21 DIAGNOSIS — E538 Deficiency of other specified B group vitamins: Secondary | ICD-10-CM | POA: Diagnosis not present

## 2017-06-21 DIAGNOSIS — D509 Iron deficiency anemia, unspecified: Secondary | ICD-10-CM | POA: Diagnosis not present

## 2017-06-21 DIAGNOSIS — Z9884 Bariatric surgery status: Secondary | ICD-10-CM | POA: Diagnosis not present

## 2017-06-26 NOTE — Progress Notes (Signed)
HEMATOLOGY/ONCOLOGY CONSULTATION NOTE  Date of Service: 06/27/2017  Patient Care Team: Unk Pinto, MD as PCP - General (Internal Medicine)  CHIEF COMPLAINTS/PURPOSE OF CONSULTATION:  Iron deficiency anemia   HISTORY OF PRESENTING ILLNESS: 05/01/17  Lisa [name] is a wonderful 42 y.o. female who has been referred to [place] by Dr Melford Aase, Gwyndolyn Saxon, MD for evaluation and management of iron deficiency.   She has a history of obesity and DM and had a gastric bypass surgery 6 years ago. Her recent labs with her PCP as of 03/29/2017.showed ferritin level 8. hgb 9.9. Platelets 436k.   She states she is doing well overall but has being more fatigued. She reports being anemic since her bypass surgery 6 years ago. She states that she is taking daily OTC multi-vitamin 2x daily and ferrous sulfate 2x daily. She tolerates it well but she states it does cause mild constipation which is alleviated by stool softeners. She reports 2 heavy menstrual losses in the past 6 months and irregular cycle. She voices she would like to have labs done through [facility] due to cost coverage as an employee.  She was noted to have iron deficiency with ferritin of around 10 or less for >1 yr despite being on ferrous sulfate twice daily. She is on PPI's which also has affected her iron absorption.  On review of systems, pt reports severe fatigue, heavy menstrual losses and denies bloody stools, black stools, fever, chills, night sweats and any other accompanying symptoms.    INTERVAL HISTORY   Lisa [name] is here for a follow up and management of her iron deficiency anemia. She presents to the clinic today noting she tolerated her iron given in 04/2017. She notes her leg cramps are resolved. She notes she previously had a low level headache that is now resolved. She notes she has not been as hungry. She has lost some weight due to that. She notes her period is as heavy as they were before. Her last was 05/2017 and she was  not to have them. She will have periods about twice a year. She currently is on a week cycle with Nuvaring. She did labs last week with [facility].    On review of symptoms, pt reports weight loss, better energy, Menorrhagia with last period, Occasional lower abdominal pain.   MEDICAL HISTORY:  Past Medical History:  Diagnosis Date  . Abdominal cramping 08/10/2011  . Anxiety   . Asthma   . Depression   . Habitual abortion history, antepartum   . Headache(784.0)   . History of diabetes mellitus    prior to gastric bypass in 2012  . Hypothyroidism   . Obesity    BMi 33  . Seizures ([place])   . UTI (lower urinary tract infection) 08/14/2011    SURGICAL HISTORY: Past Surgical History:  Procedure Laterality Date  . DILATION AND CURETTAGE OF UTERUS    . GASTRIC BYPASS  2012    SOCIAL HISTORY: Social History   Socioeconomic History  . Marital status: Married    Spouse name: Not on file  . Number of children: Not on file  . Years of education: Not on file  . Highest education level: Not on file  Social Needs  . Financial resource strain: Not on file  . Food insecurity - worry: Not on file  . Food insecurity - inability: Not on file  . Transportation needs - medical: Not on file  . Transportation needs - non-medical: Not on file  Occupational History  .  Not on file  Tobacco Use  . Smoking status: Former Smoker    Types: Cigarettes    Last attempt to quit: 11/12/1999    Years since quitting: 17.6  . Smokeless tobacco: Never Used  Substance and Sexual Activity  . Alcohol use: Yes    Alcohol/week: 1.2 oz    Types: 2 Cans of beer per week    Comment: weekly  . Drug use: No  . Sexual activity: Yes    Birth control/protection: Other-see comments    Comment: nuva ring  Other Topics Concern  . Not on file  Social History Narrative  . Not on file    FAMILY HISTORY: Family History  Problem Relation Age of Onset  . Depression Mother   . Diabetes Mother   . Anorexia  nervosa Sister   . Alcohol abuse Maternal Uncle   . Anxiety disorder Maternal Uncle   . Depression Maternal Uncle   . Alzheimer's disease Maternal Grandfather   . Depression Maternal Grandmother   . Alzheimer's disease Maternal Grandmother   . Diabetes Maternal Grandmother   . Alzheimer's disease Paternal Grandmother     ALLERGIES:  is allergic to codeine.  MEDICATIONS:  Current Outpatient Medications  Medication Sig Dispense Refill  . diazepam (VALIUM) 5 MG tablet 1/2 to 1 daily as needed for severe anxiety 30 tablet 0  . etonogestrel-ethinyl estradiol (NUVARING) 0.12-0.015 MG/24HR vaginal ring Insert one per vagina every 21 days for a total of 4 consecutive readings. Take a three-day break and then start with one ring every 21 days for a total of 4 readings again Please note this requires further rings every 3 months 4 each 3  . levothyroxine (SYNTHROID, LEVOTHROID) 300 MCG tablet TAKE 1 TABLET BY MOUTH  DAILY BEFORE BREAKFAST 90 tablet 3  . metFORMIN (GLUCOPHAGE XR) 500 MG 24 hr tablet 1-2 times daily with largest meal, start on 1 and can go up to 2 a day. 60 tablet 3  . omeprazole (PRILOSEC) 20 MG capsule     . phentermine (ADIPEX-P) 37.5 MG tablet TAKE ONE TABLET ( 37.5 MG TOTAL) BY MOUTH DAILY BEFORE BREAKFAST 30 tablet 3  . traZODone (DESYREL) 50 MG tablet TAKE 1 TABLET BY MOUTH AT  BEDTIME 90 tablet 1  . valproic acid (DEPAKENE) 250 MG capsule TAKE 2 CAPSULES BY MOUTH 3  TIMES A DAY 540 capsule 0  . SUMAtriptan (IMITREX) 100 MG tablet Take 1 tablet (100 mg total) by mouth once as needed for migraine. May repeat in 2 hours if headache persists or recurs. 10 tablet 0   No current facility-administered medications for this visit.     REVIEW OF SYSTEMS:    10 Point review of Systems was done is negative except as noted above.  PHYSICAL EXAMINATION: ECOG PERFORMANCE STATUS: 1 - Symptomatic but completely ambulatory  . Vitals:   06/27/17 0938  BP: (!) 142/94  Pulse: 95    Resp: 18  Temp: 98.5 F (36.9 C)  SpO2: 99%   Filed Weights   06/27/17 0938  Weight: 216 lb 4.8 oz (98.1 kg)   .Body mass index is 33.88 kg/m.  GENERAL:alert, in no acute distress and comfortable SKIN: no acute rashes, no significant lesions EYES: conjunctiva are pink and non-injected, sclera anicteric OROPHARYNX: MMM, no exudates, no oropharyngeal erythema or ulceration NECK: supple, no JVD LYMPH:  no palpable lymphadenopathy in the cervical, axillary or inguinal regions LUNGS: clear to auscultation b/l with normal respiratory effort HEART: regular rate & rhythm ABDOMEN:  normoactive bowel sounds , non tender, not distended. Extremity: no pedal edema PSYCH: alert & oriented x 3 with fluent speech NEURO: no focal motor/sensory deficits  LABORATORY DATA:  I have reviewed the data as listed  . CBC Latest Ref Rng & Units 03/29/2017 10/15/2016 01/18/2016  WBC 3.4 - 10.8 x10E3/uL 6.8 8.2 5.8  Hemoglobin 11.1 - 15.9 g/dL 9.9(L) 9.2(L) 10.2(L)  Hematocrit 34.0 - 46.6 % 32.4(L) 31.2(L) 33.2(L)  Platelets 150 - 379 x10E3/uL 436(H) 349 387(H)   . CBC    Component Value Date/Time   WBC 6.8 03/29/2017 1052   RBC 4.50 03/29/2017 1052   HGB 9.9 (L) 03/29/2017 1052   HCT 32.4 (L) 03/29/2017 1052   PLT 436 (H) 03/29/2017 1052   MCV 72 (L) 03/29/2017 1052   MCH 22.0 (L) 03/29/2017 1052   MCHC 30.6 (L) 03/29/2017 1052   RDW 17.5 (H) 03/29/2017 1052   LYMPHSABS 1.9 03/29/2017 1052   EOSABS 0.5 (H) 03/29/2017 1052   BASOSABS 0.0 03/29/2017 1052    . CMP Latest Ref Rng & Units 03/29/2017 10/15/2016 01/18/2016  Glucose 65 - 99 mg/dL 157(H) 101(H) 142(H)  BUN 6 - 24 mg/dL 8 10 11   Creatinine 0.57 - 1.00 mg/dL 0.78 0.71 0.77  Sodium 134 - 144 mmol/L 140 143 139  Potassium 3.5 - 5.2 mmol/L 4.2 4.9 4.2  Chloride 96 - 106 mmol/L 103 105 99  CO2 20 - 29 mmol/L 20 21 20   Calcium 8.7 - 10.2 mg/dL 8.5(L) 9.2 9.3  Total Protein 6.0 - 8.5 g/dL 7.0 - 6.8  Total Bilirubin 0.0 - 1.2 mg/dL 0.2  - 0.4  Alkaline Phos 39 - 117 IU/L 56 - 48  AST 0 - 40 IU/L 22 - 61(H)  ALT 0 - 32 IU/L 24 - 55(H)     RADIOGRAPHIC STUDIES: I have personally reviewed the radiological images as listed and agreed with the findings in the report. No results found.  ASSESSMENT & PLAN:  Jeremie Abdelaziz is a wonderful 42 y.o. female   1) Moderate microcytic Anemia due to Iron deficiency   2) Severe Iron deficiency anemia - not responsive to PO iron -Discussed possible risk factors (bypass surgery, heavy menstrual loss, chronic PPI)   3) Fatigue due to Iron deficiency  PLAN  -her iron deficiency has been refractory to adequate po iron intake for >1 year likely due to poor po absorption -patient tolerated IV Injectafer without issues. -labs show normalization of Hgb and ferritin >100. Lab results discussed with patient. -no indication for additional IV Iron at this time. -continue B complex.  RTC with Dr Irene Limbo in 6 months Patient to have labs at [facility] 2 weeks prior to visit (given lab prescription)  All of the patients questions were answered with apparent satisfaction. The patient knows to call the clinic with any problems, questions or concerns.  I spent 15 minutes counseling the patient face to face. The total time spent in the appointment was 20 minutes and more than 50% was on counseling and direct patient cares.     Sullivan Lone MD [place] AAHIVMS [facility] Hematology/Oncology Physician [facility]  (Office):       [phone] (Work cell):  [phone] (Fax):           [phone]  06/27/2017 10:17 AM   This document serves as a record of services personally performed by Sullivan Lone, MD. It was created on his behalf by Joslyn Devon, a trained medical scribe. The creation of this  record is based on the scribe's personal observations and the provider's statements to them.   .I have reviewed the above documentation for accuracy and completeness, and I agree with the  above. Brunetta Genera MD MS

## 2017-06-27 ENCOUNTER — Encounter: Payer: Self-pay | Admitting: Hematology

## 2017-06-27 ENCOUNTER — Telehealth: Payer: Self-pay

## 2017-06-27 ENCOUNTER — Telehealth: Payer: Self-pay | Admitting: Hematology

## 2017-06-27 ENCOUNTER — Ambulatory Visit (HOSPITAL_BASED_OUTPATIENT_CLINIC_OR_DEPARTMENT_OTHER): Payer: 59 | Admitting: Hematology

## 2017-06-27 VITALS — BP 142/94 | HR 95 | Temp 98.5°F | Resp 18 | Ht 67.0 in | Wt 216.3 lb

## 2017-06-27 DIAGNOSIS — Z9884 Bariatric surgery status: Secondary | ICD-10-CM

## 2017-06-27 DIAGNOSIS — E119 Type 2 diabetes mellitus without complications: Secondary | ICD-10-CM | POA: Diagnosis not present

## 2017-06-27 DIAGNOSIS — D509 Iron deficiency anemia, unspecified: Secondary | ICD-10-CM | POA: Diagnosis not present

## 2017-06-27 NOTE — Telephone Encounter (Signed)
Spoke with Regino Schultze in customer service at [employer] [phone]. Acct #: [id number]. Lab work completed on 06/21/17. Labs received via fax today and given to patient per Dr. Irene Limbo.

## 2017-06-27 NOTE — Patient Instructions (Signed)
Thank you for choosing [facility] to provide your oncology and hematology care.  To afford each patient quality time with our providers, please arrive 30 minutes before your scheduled appointment time.  If you arrive late for your appointment, you may be asked to reschedule.  We strive to give you quality time with our providers, and arriving late affects you and other patients whose appointments are after yours.  If you are a no show for multiple scheduled visits, you may be dismissed from the clinic at the providers discretion.   Again, thank you for choosing [facility], our hope is that these requests will decrease the amount of time that you wait before being seen by our physicians.  ______________________________________________________________________ Should you have questions after your visit to the  Cancer Center, please contact our office at [phone] between the hours of 8:30 and 4:30 p.m.    Voicemails left after 4:30p.m will not be returned until the following business day.   For prescription refill requests, please have your pharmacy contact us directly.  Please also try to allow 48 hours for prescription requests.   Please contact the scheduling department for questions regarding scheduling.  For scheduling of procedures such as PET scans, CT scans, MRI, Ultrasound, etc please contact central scheduling at [phone].   Resources For Cancer Patients and Caregivers:  American Cancer Society:  800-227-2345  Can help patients locate various types of support and financial assistance Cancer Care: 1-800-813-HOPE (4673) Provides financial assistance, online support groups, medication/co-pay assistance.   [place] DSS:  336-641-3447 Where to apply for food stamps, Medicaid, and utility assistance Medicare Rights Center: 800-333-4114 Helps people with Medicare understand their rights and benefits, navigate the Medicare system, and secure the  quality healthcare they deserve SCAT: 336-333-6589 [place] [employer]'s shared-ride transportation service for eligible riders who have a disability that prevents them from riding the fixed route bus.   For additional information on assistance programs please contact our social worker:   Grier Hock/Abigail Elmore:  [phone] 

## 2017-06-27 NOTE — Telephone Encounter (Signed)
Scheduled appt per 1/3 los - Gave patient AVS and calender per los. Labs drawn at [facility] - no lab appt scheduled here at [facility]

## 2017-07-12 ENCOUNTER — Encounter: Payer: Self-pay | Admitting: Physician Assistant

## 2017-07-12 NOTE — Progress Notes (Signed)
Complete Physical  Assessment and Plan:  Controlled type 2 diabetes mellitus without complication, without long-term current use of insulin ([place]) Get back on metformin Discussed general issues about diabetes pathophysiology and management., Educational material distributed., Suggested low cholesterol diet., Encouraged aerobic exercise., Discussed foot care., Reminded to get yearly retinal exam.  Hypothyroidism, unspecified type Hypothyroidism-check TSH level, continue medications the same, reminded to take on an empty stomach 30-24mins before food.   Seizure disorder [facility]) Continue medications  Dissociative identity disorder [facility]) Continue meds and follow up psych.   Recurrent major depressive disorder, in partial remission ([place]) Continue meds and follow up psych.   Panic attacks Continue meds and follow up psych.   Hyperlipidemia LDL goal <100 - check lipids, decrease fatty foods, increase activity.   Medication management LABS SENT TO [facility]  Iron deficiency anemia, unspecified iron deficiency anemia type Check iron/ferritin, s/p transfusion, following Dr. Irene Limbo  Anxiety Continue meds and follow up psych.   Obesity surgery status Monitor labs, continue weight loss  Vitamin D deficiency Continue supplement  PTSD (post-traumatic stress disorder) Continue meds and follow up psych.   Need for diphtheria-tetanus-pertussis (Tdap) vaccine -     Tdap vaccine greater than or equal to 7yo IM  Class 1 obesity due to excess calories with serious comorbidity and body mass index (BMI) of 33.0 to 33.9 in adult -     phentermine (ADIPEX-P) 37.5 MG tablet; TAKE ONE TABLET ( 37.5 MG TOTAL) BY MOUTH DAILY BEFORE BREAKFAST -     Naltrexone-Bupropion HCl ER (CONTRAVE) 8-90 MG TB12; 1 pill a morning for 1 week, then 1 pill twice a day for 1 week, 2 pills in the morning and one at night for 1 week, then 2 pill twice a day for 1 week and stay on this dose.   Discussed med's effects and  SE's. Screening labs and tests as requested with regular follow-up as recommended. LAB DONE AT [facility].   HPI  42 y.o. female  presents for a complete physical and follow up for chol, DM, seizures, anemia.    Her blood pressure has been controlled at home, today their BP is BP: 134/88 She does not workout due to the weather.  She denies chest pain, shortness of breath, dizziness.   She is not on cholesterol medication and denies myalgias. Her cholesterol is at goal. The cholesterol last visit was:   Lab Results  Component Value Date   CHOL 186 03/29/2017   HDL 67 10/15/2016   [place] 88 10/15/2016   TRIG 144 10/15/2016    She has been working on diet and exercise for diabetes, she has been off metformin and wants to get back on it, and denies paresthesia of the feet, polydipsia, polyuria and visual disturbances. Last A1C in the office was:  Lab Results  Component Value Date   HGBA1C 7.0 (H) 03/29/2017   Patient is on Vitamin D supplement.   Lab Results  Component Value Date   VD25OH 29.5 (L) 10/15/2016     She is on thyroid medication. Her medication was not changed last visit.   Lab Results  Component Value Date   TSH 1.290 03/29/2017  .   She is on trazodone for sleep, takes PRN, uses once a month.  She is on the valium PRN as well for anxiety.  She is on the [place] ring and is on it continuously. She has a history of miscarriages and infertility.  Due to her gastric bypass she has has  severe symptomatic iron def anemia, she has had an iron transfusion and is feeling better.  Lab Results  Component Value Date   IRON 16 (L) 03/29/2017   TIBC 654 (HH) 03/29/2017   FERRITIN 8 (L) 03/29/2017   She has had epilepsy since she was 14, on valproic acid 250mg , has not had a seizure in a decade, had a trial off but had seizures. Should remain on medication. Follows with Dr. Tommy Medal.  She is 6 years s/p gastric bypass, she was 260 before the surgery and got down to 167.  She  would like to get back to 180.  BMI is Body mass index is 33.45 kg/m., she is working on diet and exercise. She is on contrave daily, and will take phentermine AS needed.  Wt Readings from Last 10 Encounters:  07/16/17 220 lb (99.8 kg)  06/27/17 216 lb 4.8 oz (98.1 kg)  05/08/17 224 lb 3.2 oz (101.7 kg)  05/01/17 225 lb (102.1 kg)  10/17/16 220 lb 6.4 oz (100 kg)  04/30/16 222 lb (100.7 kg)  02/22/16 208 lb 6.4 oz (94.5 kg)  01/24/16 209 lb 9.6 oz (95.1 kg)  11/23/15 205 lb 9.6 oz (93.3 kg)  07/26/15 219 lb (99.3 kg)    Current Outpatient Medications on File Prior to Visit  Medication Sig  . diazepam (VALIUM) 5 MG tablet 1/2 to 1 daily as needed for severe anxiety  . etonogestrel-ethinyl estradiol (NUVARING) 0.12-0.015 MG/24HR vaginal ring Insert one per vagina every 21 days for a total of 4 consecutive readings. Take a three-day break and then start with one ring every 21 days for a total of 4 readings again Please note this requires further rings every 3 months  . levothyroxine (SYNTHROID, LEVOTHROID) 300 MCG tablet TAKE 1 TABLET BY MOUTH  DAILY BEFORE BREAKFAST  . omeprazole (PRILOSEC) 20 MG capsule   . traZODone (DESYREL) 50 MG tablet TAKE 1 TABLET BY MOUTH AT  BEDTIME  . SUMAtriptan (IMITREX) 100 MG tablet Take 1 tablet (100 mg total) by mouth once as needed for migraine. May repeat in 2 hours if headache persists or recurs.   No current facility-administered medications on file prior to visit.     Health Maintenance:   TDAP: TODAY Pneumovax: DUE Flu vaccine: 2018 Zostavax:  LMP: on nuva ring constantly, last year with OB/GYN Pap: July 2018 see GYN MGM: July 2018 at GYN Dr. Leo Grosser DEXA: Colonoscopy: EGD:  Patient Care Team: Unk Pinto, MD as PCP - General (Internal Medicine)  Medical History:  Past Medical History:  Diagnosis Date  . Abdominal cramping 08/10/2011  . Anxiety   . Asthma   . Depression   . Habitual abortion history, antepartum   .  Headache(784.0)   . History of diabetes mellitus    prior to gastric bypass in 2012  . History of recurrent miscarriages, not currently pregnant 10/11/2011  . Hypothyroidism   . Infertility associated with anovulation   . Obesity    BMi 33  . Seizures ([place])   . UTI (lower urinary tract infection) 08/14/2011   Allergies Allergies  Allergen Reactions  . Codeine Itching, Rash and Other (See Comments)    OPIOIDS - MORPHINE & RELATED    SURGICAL HISTORY She  has a past surgical history that includes Gastric bypass (2012) and Dilation and curettage of uterus. FAMILY HISTORY Her family history includes Alcohol abuse in her maternal uncle; Alzheimer's disease in her maternal grandfather, maternal grandmother, and paternal grandmother; Anorexia nervosa in her  sister; Anxiety disorder in her maternal uncle; Depression in her maternal grandmother, maternal uncle, and mother; Diabetes in her maternal grandmother and mother. SOCIAL HISTORY She  reports that she quit smoking about 17 years ago. Her smoking use included cigarettes. she has never used smokeless tobacco. She reports that she drinks about 1.2 oz of alcohol per week. She reports that she does not use drugs.  Review of Systems: Review of Systems  Constitutional: Positive for malaise/fatigue. Negative for chills, diaphoresis, fever and weight loss.  HENT: Negative.   Respiratory: Negative.   Cardiovascular: Negative.   Gastrointestinal: Negative for abdominal pain, blood in stool, constipation, diarrhea, heartburn, melena, nausea and vomiting.  Genitourinary: Negative.   Musculoskeletal: Negative.   Skin: Negative.   Neurological: Negative.  Negative for weakness.  Psychiatric/Behavioral: Negative for depression, hallucinations, memory loss, substance abuse and suicidal ideas. The patient is not nervous/anxious and does not have insomnia.     Physical Exam: Estimated body mass index is 33.45 kg/m as calculated from the following:    Height as of this encounter: 5\' 8"  (1.727 m).   Weight as of this encounter: 220 lb (99.8 kg). BP 134/88   Pulse 80   Temp (!) 97.3 F (36.3 C)   Resp 16   Ht 5\' 8"  (1.727 m)   Wt 220 lb (99.8 kg)   LMP 07/15/2017   SpO2 98%   BMI 33.45 kg/m  General Appearance: Well nourished, in no apparent distress.  Eyes: PERRLA, EOMs, conjunctiva no swelling or erythema, normal fundi and vessels.  Sinuses: No Frontal/maxillary tenderness  ENT/Mouth: Ext aud canals clear, normal light reflex with TMs without erythema, bulging. Good dentition. No erythema, swelling, or exudate on post pharynx. Tonsils not swollen or erythematous. Hearing normal. Crowded mouth.  Neck: Supple, thyroid normal. No bruits  Respiratory: Respiratory effort normal, BS equal bilaterally without rales, rhonchi, wheezing or stridor.  Cardio: RRR without murmurs, rubs or gallops. Prominent S2. Brisk peripheral pulses without edema.  Chest: symmetric, with normal excursions and percussion.  Breasts: defer Abdomen: Soft, nontender, no guarding, rebound, hernias, masses, or organomegaly. .  Lymphatics: Non tender without lymphadenopathy.  Genitourinary: defer Musculoskeletal: Full ROM all peripheral extremities,5/5 strength, and normal gait.  Skin: Warm, dry without rashes, lesions, ecchymosis. Neuro: Cranial nerves intact, reflexes equal bilaterally. Normal muscle tone, no cerebellar symptoms. Sensation intact.  Psych: Awake and oriented X 3, normal affect, Insight and Judgment appropriate.    EKG- declines at this time.   Vicie Mutters 4:48 PM [facility]

## 2017-07-16 ENCOUNTER — Ambulatory Visit (INDEPENDENT_AMBULATORY_CARE_PROVIDER_SITE_OTHER): Payer: 59 | Admitting: Physician Assistant

## 2017-07-16 ENCOUNTER — Other Ambulatory Visit: Payer: Self-pay | Admitting: Physician Assistant

## 2017-07-16 ENCOUNTER — Other Ambulatory Visit: Payer: Self-pay

## 2017-07-16 ENCOUNTER — Encounter: Payer: Self-pay | Admitting: Physician Assistant

## 2017-07-16 VITALS — BP 134/88 | HR 80 | Temp 97.3°F | Resp 16 | Ht 68.0 in | Wt 220.0 lb

## 2017-07-16 DIAGNOSIS — F4310 Post-traumatic stress disorder, unspecified: Secondary | ICD-10-CM

## 2017-07-16 DIAGNOSIS — F4481 Dissociative identity disorder: Secondary | ICD-10-CM

## 2017-07-16 DIAGNOSIS — G40909 Epilepsy, unspecified, not intractable, without status epilepticus: Secondary | ICD-10-CM

## 2017-07-16 DIAGNOSIS — F419 Anxiety disorder, unspecified: Secondary | ICD-10-CM

## 2017-07-16 DIAGNOSIS — F410 Panic disorder [episodic paroxysmal anxiety] without agoraphobia: Secondary | ICD-10-CM

## 2017-07-16 DIAGNOSIS — Z23 Encounter for immunization: Secondary | ICD-10-CM

## 2017-07-16 DIAGNOSIS — Z0000 Encounter for general adult medical examination without abnormal findings: Secondary | ICD-10-CM | POA: Diagnosis not present

## 2017-07-16 DIAGNOSIS — E119 Type 2 diabetes mellitus without complications: Secondary | ICD-10-CM

## 2017-07-16 DIAGNOSIS — E559 Vitamin D deficiency, unspecified: Secondary | ICD-10-CM

## 2017-07-16 DIAGNOSIS — E785 Hyperlipidemia, unspecified: Secondary | ICD-10-CM

## 2017-07-16 DIAGNOSIS — E6609 Other obesity due to excess calories: Secondary | ICD-10-CM

## 2017-07-16 DIAGNOSIS — D509 Iron deficiency anemia, unspecified: Secondary | ICD-10-CM

## 2017-07-16 DIAGNOSIS — Z79899 Other long term (current) drug therapy: Secondary | ICD-10-CM

## 2017-07-16 DIAGNOSIS — Z6833 Body mass index (BMI) 33.0-33.9, adult: Secondary | ICD-10-CM

## 2017-07-16 DIAGNOSIS — Z9884 Bariatric surgery status: Secondary | ICD-10-CM

## 2017-07-16 DIAGNOSIS — F3341 Major depressive disorder, recurrent, in partial remission: Secondary | ICD-10-CM

## 2017-07-16 DIAGNOSIS — E039 Hypothyroidism, unspecified: Secondary | ICD-10-CM

## 2017-07-16 MED ORDER — VALPROIC ACID 250 MG PO CAPS: 500.0000 mg | ORAL_CAPSULE | Freq: Three times a day (TID) | ORAL | 0 refills | Status: DC

## 2017-07-16 MED ORDER — NALTREXONE-BUPROPION HCL ER 8-90 MG PO TB12: ORAL_TABLET | ORAL | 2 refills | Status: DC

## 2017-07-16 MED ORDER — PHENTERMINE HCL 37.5 MG PO TABS: ORAL_TABLET | ORAL | 3 refills | Status: DC

## 2017-07-16 NOTE — Progress Notes (Signed)
Phentermine has been called into pharmacy [facility]) on Jan 22nd 2018 by DD

## 2017-07-16 NOTE — Patient Instructions (Signed)
Are you an emotional eater? °Do you eat more when you’re feeling stressed? °Do you eat when you’re not hungry or when you’re full? °Do you eat to feel better (to calm and soothe yourself when you’re sad, mad, bored, anxious, etc.)? °Do you reward yourself with food? °Do you regularly eat until you’ve stuffed yourself? °Does food make you feel safe? Do you feel like food is a friend? °Do you feel powerless or out of control around food? ° °If you answered yes to some of these questions than it is likely that you are an emotional eater. This is normally a learned behavior and can take time to first recognize the signs and second BREAK THE HABIT. But here is more information and tips to help.  ° °The difference between emotional hunger and physical hunger °Emotional hunger can be powerful, so it’s easy to mistake it for physical hunger. But there are clues you can look for to help you tell physical and emotional hunger apart. ° °Emotional hunger comes on suddenly. It hits you in an instant and feels overwhelming and urgent. Physical hunger, on the other hand, comes on more gradually. The urge to eat doesn’t feel as dire or demand instant satisfaction (unless you haven’t eaten for a very long time). ° °Emotional hunger craves specific comfort foods. When you’re physically hungry, almost anything sounds good--including healthy stuff like vegetables. But emotional hunger craves junk food or sugary snacks that provide an instant rush. You feel like you need cheesecake or pizza, and nothing else will do. ° °Emotional hunger often leads to mindless eating. Before you know it, you’ve eaten a whole bag of chips or an entire pint of ice cream without really paying attention or fully enjoying it. When you’re eating in response to physical hunger, you’re typically more aware of what you’re doing. ° °Emotional hunger isn’t satisfied once you’re full. You keep wanting more and more, often eating until you’re uncomfortably stuffed.  Physical hunger, on the other hand, doesn’t need to be stuffed. You feel satisfied when your stomach is full. ° °Emotional hunger isn’t located in the stomach. Rather than a growling belly or a pang in your stomach, you feel your hunger as a craving you can’t get out of your head. You’re focused on specific textures, tastes, and smells. ° °Emotional hunger often leads to regret, guilt, or shame. When you eat to satisfy physical hunger, you’re unlikely to feel guilty or ashamed because you’re simply giving your body what it needs. If you feel guilty after you eat, it’s likely because you know deep down that you’re not eating for nutritional reasons. ° °Identify your emotional eating triggers °What situations, places, or feelings make you reach for the comfort of food? Most emotional eating is linked to unpleasant feelings, but it can also be triggered by positive emotions, such as rewarding yourself for achieving a goal or celebrating a holiday or happy event. Common causes of emotional eating include: ° °Stuffing emotions - Eating can be a way to temporarily silence or “stuff down” uncomfortable emotions, including anger, fear, sadness, anxiety, loneliness, resentment, and shame. While you’re numbing yourself with food, you can avoid the difficult emotions you’d rather not feel. ° °Boredom or feelings of emptiness - Do you ever eat simply to give yourself something to do, to relieve boredom, or as a way to fill a void in your life? You feel unfulfilled and empty, and food is a way to occupy your mouth and your time. In the moment,   it fills you up and distracts you from underlying feelings of purposelessness and dissatisfaction with your life. ° °Childhood habits - Think back to your childhood memories of food. Did your parents reward good behavior with ice cream, take you out for pizza when you got a good report card, or serve you sweets when you were feeling sad? These habits can often carry over into adulthood. Or  your eating may be driven by nostalgia--for cherished memories of grilling burgers in the backyard with your dad or baking and eating cookies with your mom. ° °Social influences - Getting together with other people for a meal is a great way to relieve stress, but it can also lead to overeating. It’s easy to overindulge simply because the food is there or because everyone else is eating. You may also overeat in social situations out of nervousness. Or perhaps your family or circle of friends encourages you to overeat, and it’s easier to go along with the group. ° °Stress - Ever notice how stress makes you hungry? It’s not just in your mind. When stress is chronic, as it so often is in our chaotic, fast-paced world, your body produces high levels of the stress hormone, cortisol. Cortisol triggers cravings for salty, sweet, and fried foods--foods that give you a burst of energy and pleasure. The more uncontrolled stress in your life, the more likely you are to turn to food for emotional relief. ° °Find other ways to feed your feelings °If you don’t know how to manage your emotions in a way that doesn’t involve food, you won’t be able to control your eating habits for very long. Diets so often fail because they offer logical nutritional advice which only works if you have conscious control over your eating habits. It doesn’t work when emotions hijack the process, demanding an immediate payoff with food. ° °In order to stop emotional eating, you have to find other ways to fulfill yourself emotionally. It’s not enough to understand the cycle of emotional eating or even to understand your triggers, although that’s a huge first step. You need alternatives to food that you can turn to for emotional fulfillment. ° °Alternatives to emotional eating °If you’re depressed or lonely, call someone who always makes you feel better, play with your dog or cat, or look at a favorite photo or cherished memento. ° °If you’re anxious,  expend your nervous energy by dancing to your favorite song, squeezing a stress ball, or taking a brisk walk. ° °If you’re exhausted, treat yourself with a hot cup of tea, take a bath, light some scented candles, or wrap yourself in a warm blanket. ° °If you’re bored, read a good book, watch a comedy show, explore the outdoors, or turn to an activity you enjoy (woodworking, playing the guitar, shooting hoops, scrapbooking, etc.). ° °What is mindful eating? °Mindful eating is a practice that develops your awareness of eating habits and allows you to pause between your triggers and your actions. Most emotional eaters feel powerless over their food cravings. When the urge to eat hits, you feel an almost unbearable tension that demands to be fed, right now. Because you’ve tried to resist in the past and failed, you believe that your willpower just isn’t up to snuff. But the truth is that you have more power over your cravings than you think. ° °Take 5 before you give in to a craving °Emotional eating tends to be automatic and virtually mindless. Before you even realize what you’re doing, you’ve   reached for a tub of ice cream and polished off half of it. But if you can take a moment to pause and reflect when you're hit with a craving, you give yourself the opportunity to make a different decision.  Can you put off eating for five minutes? Or just start with one minute. Don't tell yourself you can't give in to the craving; remember, the forbidden is extremely tempting. Just tell yourself to wait.  While you're waiting, check in with yourself. How are you feeling? What's going on emotionally? Even if you end up eating, you'll have a better understanding of why you did it. This can help you set yourself up for a different response next time.  How to practice mindful eating Eating while you're also doing other things-such as watching TV, driving, or playing with your phone-can prevent you from fully enjoying your  food. Since your mind is elsewhere, you may not feel satisfied or continue eating even though you're no longer hungry. Eating more mindfully can help focus your mind on your food and the pleasure of a meal and curb overeating.   Eat your meals in a calm place with no distractions, aside from any dining companions.  Try eating with your non-dominant hand or using chopsticks instead of a knife and fork. Eating in such a non-familiar way can slow down how fast you eat and ensure your mind stays focused on your food.  Allow yourself enough time not to have to rush your meal. Set a timer for 20 minutes and pace yourself so you spend at least that much time eating.  Take small bites and chew them well, taking time to notice the different flavors and textures of each mouthful.  Put your utensils down between bites. Take time to consider how you feel-hungry, satiated-before picking up your utensils again.  Try to stop eating before you are full.It takes time for the signal to reach your brain that you've had enough. Don't feel obligated to always clean your plate.  When you've finished your food, take a few moments to assess if you're really still hungry before opting for an extra serving or dessert.  Learn to accept your feelings-even the bad ones  While it may seem that the core problem is that you're powerless over food, emotional eating actually stems from feeling powerless over your emotions. You don't feel capable of dealing with your feelings head on, so you avoid them with food.  Recommended reading  Mini habits for weight loss book  Healthy Eating: A guide to the new nutrition - [place] [facility]  10 Tips for Mindful Eating - How mindfulness can help you fully enjoy a meal and the experience of eating-with moderation and restraint. ([place])  Weight Loss: Gain Control of Emotional Eating - Tips to regain control of your eating habits. [facility])  Why Stress Causes People to Overeat -Tips on controlling stress eating. ([place])  Mindful Eating Meditations -Free online mindfulness meditations. ([facility])    8 Critical Weight-Loss Tips That Aren't Diet and Exercise  1. STARVE THE DISTRACTIONS  All too often when we eat, we're also multitasking: watching TV, answering emails, scrolling through social media. These habits are detrimental to having a strong, clear, healthy relationship with food, and they can hinder our ability to make dietary changes.  In order to truly focus on what you're eating, how much you're eating, why you're eating those specific foods  and, most importantly, how those foods make you feel, you need to starve the distractions. That means when you eat, just eat. Focus on your food, the process it went through to end up on your plate, where it came from and how it nourishes you. With this technique, you're more likely to finish a meal feeling satiated.  2.  CONSIDER WHAT YOU'RE NOT WILLING TO DO  This might sound counterintuitive, but it can help provide a "why" when motivation is waning. Declare, in writing, what you are unwilling to do, for example "I am unwilling to be the old dad who cannot play sports with my children".  So consider what you're not willing to accept, write it down, and keep it at the ready.  3.  STOP LABELING FOOD "GOOD" AND "BAD"  You've probably heard someone say they ate something "bad." Maybe you've even said it yourself.  The trouble with 'bad' foods isn't that they'll send you to the grave after a bite or two. The trouble comes when we eat excessive portions of really calorie-dense foods meal after meal, day after day.  Instead of labeling foods as good or bad, think about which foods you can eat a lot of, and which ones you should just eat a little of. Then, plan ways to eat the foods you really like in portions that fit with your overall  goals. A good example of this would be having a slice of pizza alongside a club salad with chicken breast, avocado and a bit of dressing. This is vastly different than 3 slices of pizza, 4 breadsticks with cheese sauce and half of a liter of regular soda.  4.  BRUSH YOUR TEETH AFTER YOU EAT  Getting your mindset in order is important, but sometimes small habits can make a big difference. After eating, you still have the taste of food in their mouth, which often causes people to eat more even if they are full or engage in a nibble or two of dessert.  Brushing your teeth will remove the taste of food from your mouth, and the clean, minty freshness will serve as a cue that mealtime is over.  5.  FOCUS ON CROWDING NOT CUTTING  The most common first step during 'dieting' is to cut. We cut our portion sizes down, we cut out 'bad' foods, we cut out entire food groups. This act of cutting puts [place] and our minds into scarcity mode.  When something is off-limits, even if you're able to avoid it for a while, you could end up bingeing on it later because you've gone so long without it. So, instead of cutting, focus on crowding. If you crowd your plate and fill it up with more foods like veggies and protein, it simply allows less room for the other stuff. In other words, shift your focus away from what you can't eat, and celebrate the foods that will help you reach your goals.  6.  TAKE TRACKING A STEP FURTHER  Track what you eat, when you ate it, how much you ate and how that food made you feel. Being completely honest with yourself and writing down every single thing that passes through your lips will help you start to notice that maybe you actually do snack, possibly take in more sugar than you thought, eat when you're bored rather than just hungry or maybe that you have a habit of snacking before bed while watching TV.  The difference from simply tracking your food intake is  you're taking into account how food  makes you feel, as well as what you're doing while you're eating. This is about becoming more mindful of what, when and why you eat.  7.  PRIORITIZE GOOD SLEEP  One of the strongest risk factors for being overweight is poor sleep. When you're feeling tired, you're more likely to choose unhealthy comfort foods and to skip your workout. Additionally, sleep deprivation may slow down your metabolism. [employer]! Therefore, sleeping 7-8 hours per night can help with weight loss without having to change your diet or increase your physical activity. And if you feel you snore and still wake up tired, talk with me about sleep apnea.  8.  SET ASIDE TIME TO DISCONNECT  Just get out there. Disconnect from the electronics and connect to the elements. Not only will this help reduce stress (a major factor in weight gain) by giving your mind a break from the constant stimulation we've all become so accustomed to, but it may also reprogram your brain to connect with yourself and what you're feeling.  SMART goals Having a solid fitness goal is an amazing way to power you towards success, but not all goals are created equal. While it's great to have an end-game in mind, there are some best practices when it comes to goal setting. Whether you want to lose weight, improve your fitness level, or train for an event, putting the SMART method into action can help you achieve what you set out to do.  SMART stands for specific, measurable, attainable, relevant, and timely-all of which are important in reaching a fitness objective. SMART goals can help keep you on track and remind you of your priorities, so you're able to follow through with every workout or healthy meal you have planned.   Get SMART and put these five elements into action when you're setting your fitness goal.  1. Specific  You need something that's not too arbitrary. For example, a bad goal would be, say, 'get healthy'. A specific goal would be to lose weight.  You'll narrow down that goal even further by using the rest of the method, but whether you want to get stronger, faster, or smaller, having a baseline points you in the right direction.  2. Measurable  Here's where you determine exactly how you'll measure your goal. If you're going to follow the bad goal, it would be get really healthy.That's not quantifiable. A measurable goal would be, say, 'lose 10 pounds'. You can quantify your progress, and you can sort of back into a time frame once you have that. Your goal may be to master a pull-up, run five miles, or go to the gym four days a week-whatever it is, you should have a definite way of knowing when you've reached your goal.  3. Attainable  While it can be helpful to set big-picture goals in the long-term, you need a more achievable goal on the horizon to keep you on track. You want to start small and see early wins, which encourages long-term consistency.  If you set something too lofty right off the bat, it might be discouraging to not make progress as fast as you would like. You should also consider the size of your goal-for example, a goal of losing 30 pounds in one month just isn't going to happen, so you're better off setting smaller goals that are in closer reach.  4. Relevant  This is where things get a little tricky, finding your "why" is easier said  than done. Ask yourself, 'is this goal worthwhile, and am I motivated to do it?' Creating a goal with some type of motivation attached to it, like I want to lose 10 pounds in two months to be ready for my wedding, can give a bit of relevancy to your goal. Whether you want to feel confident at a big event or perform better during everyday activities, pinpoint why a goal is important to you.  5. Timely  You want to be strict about a deadline-doing so creates urgency. It's also important not to set your sights too far out. If you give yourself four months to lose 10 pounds, that might be too long  because you aren't incentivized to start working at it immediately. Instead, consider setting smaller goals along the way, like "I want to lose three pounds in two weeks." Maybe running a marathon is your long-term goal, but if you've never been a runner, signing up for one that's a month away isn't realistic-instead, set smaller mileage goals for shorter time periods and work your way up.  You should also be honest with yourself about what you're able to accomplish in a given time frame. You just need to adjust your expectations so they're in line with your schedule and commitments.  Once you have your goal in place, it's all about the follow-through. Whether you want to lose one pound a week, be able to do five full push-ups in two weeks, or run a 5K in under 30 minutes in four weeks, you can come up with a plan to help get your where you want to go-but it all starts with deciding what you want. Be accountable to yourself, stay consistent, and the results will follow.  So try the exercise at home and at your next visit come in with your SMART goal and we can discuss how together we can achieve this goal!

## 2017-09-10 ENCOUNTER — Other Ambulatory Visit: Payer: Self-pay | Admitting: Physician Assistant

## 2017-09-10 MED ORDER — NALTREXONE HCL 50 MG PO TABS: ORAL_TABLET | ORAL | 0 refills | Status: DC

## 2017-10-17 NOTE — Progress Notes (Signed)
FOLLOW UP  Assessment and Plan:  Controlled type 2 diabetes mellitus without complication, without long-term current use of insulin ([place]) Get back on metformin Discussed general issues about diabetes pathophysiology and management., Educational material distributed., Suggested low cholesterol diet., Encouraged aerobic exercise., Discussed foot care., Reminded to get yearly retinal exam.  Hypothyroidism, unspecified type Hypothyroidism-check TSH level, continue medications the same, reminded to take on an empty stomach 30-23mins before food.   Seizure disorder [facility]) Continue medications  Dissociative identity disorder [facility]) Continue meds and follow up psych.   Recurrent major depressive disorder, in partial remission ([place]) Continue meds and follow up psych.   Hyperlipidemia LDL goal <100 - check lipids, decrease fatty foods, increase activity.   Medication management LABS SENT TO [facility]  Iron deficiency anemia, unspecified iron deficiency anemia type Check iron/ferritin, s/p transfusion, following Dr. Irene Limbo  Obesity surgery status Monitor labs, continue weight loss  Class 1 obesity due to excess calories with serious comorbidity and body mass index (BMI) of 33.0 to 33.9 in adult -     phentermine (ADIPEX-P) 37.5 MG tablet; TAKE ONE TABLET ( 37.5 MG TOTAL) BY MOUTH DAILY BEFORE BREAKFAST - naltraxone 50mg  at night   Discussed med's effects and SE's. Screening labs and tests as requested with regular follow-up as recommended. LAB DONE AT [facility], HAS RX FOR LABS AND WILL GET DONE  HPI  42 y.o. female  presents for  follow up for chol, DM, seizures, anemia.    Her blood pressure has been controlled at home, today their BP is BP: 140/88 She does not workout due to the weather.  She denies chest pain, shortness of breath, dizziness.   She is not on cholesterol medication and denies myalgias. Her cholesterol is at goal. The cholesterol last visit was:   Lab Results   Component Value Date   CHOL 186 03/29/2017   HDL 67 10/15/2016   [place] 88 10/15/2016   TRIG 144 10/15/2016    She has been working on diet and exercise for diabetes, she has been off metformin and wants to get back on it, and denies paresthesia of the feet, polydipsia, polyuria and visual disturbances. Last A1C in the office was:  Lab Results  Component Value Date   HGBA1C 7.0 (H) 03/29/2017   Patient is on Vitamin D supplement.   Lab Results  Component Value Date   VD25OH 29.5 (L) 10/15/2016     She is on thyroid medication. Her medication was not changed last visit.   Lab Results  Component Value Date   TSH 1.290 03/29/2017  .   She is on trazodone for sleep, takes PRN, uses once a month.  She is on the valium PRN as well for anxiety.   She is on the [place] ring and is on it continuously. She has a history of miscarriages and infertility HOWEVER she has been having abdominal cramping and spotting x 2 weeks with occ spotting. She will talk with GYN for IUD.    Due to her gastric bypass she has has severe symptomatic iron def anemia, she has had an iron transfusion and is feeling better.  Lab Results  Component Value Date   IRON 16 (L) 03/29/2017   TIBC 654 (HH) 03/29/2017   FERRITIN 8 (L) 03/29/2017   She has had epilepsy since she was 14, on valproic acid 250mg , has not had a seizure in a decade, had a trial off but had seizures. Should remain on medication. Follows with Dr. Delfino Lovett  Minna Antis.  She is 6 years s/p gastric bypass, she was 260 before the surgery and got down to 167.  She would like to get back to 180.  BMI is Body mass index is 33.85 kg/m., she is working on diet and exercise. She is on naltrexone due to not doing well with the wellbutrin, it was causing tremor/shakes, and will take phentermine AS needed.  Wt Readings from Last 10 Encounters:  10/21/17 222 lb 9.6 oz (101 kg)  07/16/17 220 lb (99.8 kg)  06/27/17 216 lb 4.8 oz (98.1 kg)  05/08/17 224 lb 3.2 oz  (101.7 kg)  05/01/17 225 lb (102.1 kg)  10/17/16 220 lb 6.4 oz (100 kg)  04/30/16 222 lb (100.7 kg)  02/22/16 208 lb 6.4 oz (94.5 kg)  01/24/16 209 lb 9.6 oz (95.1 kg)  11/23/15 205 lb 9.6 oz (93.3 kg)    Current Outpatient Medications on File Prior to Visit  Medication Sig  . diazepam (VALIUM) 5 MG tablet 1/2 to 1 daily as needed for severe anxiety  . etonogestrel-ethinyl estradiol (NUVARING) 0.12-0.015 MG/24HR vaginal ring Insert one per vagina every 21 days for a total of 4 consecutive readings. Take a three-day break and then start with one ring every 21 days for a total of 4 readings again Please note this requires further rings every 3 months  . levothyroxine (SYNTHROID, LEVOTHROID) 300 MCG tablet TAKE 1 TABLET BY MOUTH  DAILY BEFORE BREAKFAST  . metFORMIN (GLUCOPHAGE-XR) 500 MG 24 hr tablet TAKE 1 TO 2 TABLETS BY  MOUTH DAILY WITH LARGEST  MEAL. START ON 1 TABLET  ONCE A DAY, AND CAN GO UP  TO 1 TABLET TWICE A DAY.  . naltrexone (DEPADE) 50 MG tablet 1/2 pill before supper daily  . omeprazole (PRILOSEC) 20 MG capsule   . phentermine (ADIPEX-P) 37.5 MG tablet TAKE ONE TABLET ( 37.5 MG TOTAL) BY MOUTH DAILY BEFORE BREAKFAST  . traZODone (DESYREL) 50 MG tablet TAKE 1 TABLET BY MOUTH AT  BEDTIME  . valproic acid (DEPAKENE) 250 MG capsule Take 2 capsules (500 mg total) by mouth 3 (three) times daily.  . SUMAtriptan (IMITREX) 100 MG tablet Take 1 tablet (100 mg total) by mouth once as needed for migraine. May repeat in 2 hours if headache persists or recurs.   No current facility-administered medications on file prior to visit.    Medical History:  Past Medical History:  Diagnosis Date  . Abdominal cramping 08/10/2011  . Anxiety   . Asthma   . Depression   . Habitual abortion history, antepartum   . Headache(784.0)   . History of diabetes mellitus    prior to gastric bypass in 2012  . History of recurrent miscarriages, not currently pregnant 10/11/2011  . Hypothyroidism   .  Infertility associated with anovulation   . Obesity    BMi 33  . Seizures ([place])   . UTI (lower urinary tract infection) 08/14/2011   Allergies Allergies  Allergen Reactions  . Codeine Itching, Rash and Other (See Comments)    OPIOIDS - MORPHINE & RELATED    SURGICAL HISTORY She  has a past surgical history that includes Gastric bypass (2012) and Dilation and curettage of uterus. FAMILY HISTORY Her family history includes Alcohol abuse in her maternal uncle; Alzheimer's disease in her maternal grandfather, maternal grandmother, and paternal grandmother; Anorexia nervosa in her sister; Anxiety disorder in her maternal uncle; Depression in her maternal grandmother, maternal uncle, and mother; Diabetes in her maternal grandmother and mother. SOCIAL  HISTORY She  reports that she quit smoking about 17 years ago. Her smoking use included cigarettes. She has never used smokeless tobacco. She reports that she drinks about 1.2 oz of alcohol per week. She reports that she does not use drugs.  Review of Systems: Review of Systems  Constitutional: Positive for malaise/fatigue. Negative for chills, diaphoresis, fever and weight loss.  HENT: Negative.   Respiratory: Negative.   Cardiovascular: Negative.   Gastrointestinal: Negative for abdominal pain, blood in stool, constipation, diarrhea, heartburn, melena, nausea and vomiting.  Genitourinary: Negative.   Musculoskeletal: Negative.   Skin: Negative.   Neurological: Negative.  Negative for weakness.  Psychiatric/Behavioral: Negative for depression, hallucinations, memory loss, substance abuse and suicidal ideas. The patient is not nervous/anxious and does not have insomnia.     Physical Exam: Estimated body mass index is 33.85 kg/m as calculated from the following:   Height as of this encounter: 5\' 8"  (1.727 m).   Weight as of this encounter: 222 lb 9.6 oz (101 kg). BP 140/88   Pulse (!) 106   Temp 98.6 F (37 C)   Resp 16   Ht 5\' 8"   (1.727 m)   Wt 222 lb 9.6 oz (101 kg)   SpO2 99%   BMI 33.85 kg/m  General Appearance: Well nourished, in no apparent distress.  Eyes: PERRLA, EOMs, conjunctiva no swelling or erythema, normal fundi and vessels.  Sinuses: No Frontal/maxillary tenderness  ENT/Mouth: Ext aud canals clear, normal light reflex with TMs without erythema, bulging. Good dentition. No erythema, swelling, or exudate on post pharynx. Tonsils not swollen or erythematous. Hearing normal. Crowded mouth.  Neck: Supple, thyroid normal. No bruits  Respiratory: Respiratory effort normal, BS equal bilaterally without rales, rhonchi, wheezing or stridor.  Cardio: RRR without murmurs, rubs or gallops. Prominent S2. Brisk peripheral pulses without edema.  Chest: symmetric, with normal excursions and percussion.  Abdomen: Soft, nontender, no guarding, rebound, hernias, masses, or organomegaly. .  Lymphatics: Non tender without lymphadenopathy.  Musculoskeletal: Full ROM all peripheral extremities,5/5 strength, and normal gait.  Skin: Warm, dry without rashes, lesions, ecchymosis. Neuro: Cranial nerves intact, reflexes equal bilaterally. Normal muscle tone, no cerebellar symptoms. Sensation intact.  Psych: Awake and oriented X 3, normal affect, Insight and Judgment appropriate.    Vicie Mutters 4:37 PM [facility]

## 2017-10-21 ENCOUNTER — Encounter: Payer: Self-pay | Admitting: Physician Assistant

## 2017-10-21 ENCOUNTER — Ambulatory Visit: Payer: 59 | Admitting: Physician Assistant

## 2017-10-21 VITALS — BP 140/88 | HR 106 | Temp 98.6°F | Resp 16 | Ht 68.0 in | Wt 222.6 lb

## 2017-10-21 DIAGNOSIS — E039 Hypothyroidism, unspecified: Secondary | ICD-10-CM

## 2017-10-21 DIAGNOSIS — Z79899 Other long term (current) drug therapy: Secondary | ICD-10-CM

## 2017-10-21 DIAGNOSIS — G40909 Epilepsy, unspecified, not intractable, without status epilepticus: Secondary | ICD-10-CM

## 2017-10-21 DIAGNOSIS — E785 Hyperlipidemia, unspecified: Secondary | ICD-10-CM | POA: Diagnosis not present

## 2017-10-21 DIAGNOSIS — E119 Type 2 diabetes mellitus without complications: Secondary | ICD-10-CM | POA: Diagnosis not present

## 2017-10-21 DIAGNOSIS — F4481 Dissociative identity disorder: Secondary | ICD-10-CM | POA: Diagnosis not present

## 2017-10-21 DIAGNOSIS — Z6833 Body mass index (BMI) 33.0-33.9, adult: Secondary | ICD-10-CM | POA: Diagnosis not present

## 2017-10-21 DIAGNOSIS — D509 Iron deficiency anemia, unspecified: Secondary | ICD-10-CM

## 2017-10-21 DIAGNOSIS — E6609 Other obesity due to excess calories: Secondary | ICD-10-CM

## 2017-10-21 DIAGNOSIS — F4310 Post-traumatic stress disorder, unspecified: Secondary | ICD-10-CM | POA: Diagnosis not present

## 2017-10-21 MED ORDER — CYCLOBENZAPRINE HCL 10 MG PO TABS: 10.0000 mg | ORAL_TABLET | Freq: Every day | ORAL | 1 refills | Status: DC

## 2017-10-21 MED ORDER — NALTREXONE HCL 50 MG PO TABS: ORAL_TABLET | ORAL | 1 refills | Status: DC

## 2017-10-21 MED ORDER — PHENTERMINE HCL 37.5 MG PO TABS: ORAL_TABLET | ORAL | 3 refills | Status: DC

## 2017-10-21 MED ORDER — SUMATRIPTAN SUCCINATE 100 MG PO TABS: 100.0000 mg | ORAL_TABLET | Freq: Once | ORAL | 0 refills | Status: DC | PRN

## 2017-10-21 NOTE — Patient Instructions (Signed)
We are starting you on Metformin to prevent or treat diabetes.  Metformin does NOT cause low blood sugars.   In order to create energy your cells need insulin and sugar but sometime your cells do not accept the insulin and this can cause increased sugars and decreased energy.   The Metformin helps your cells accept insulin and the sugar this help: 1) increase your energy  2) weight loss.    The two most common side effects are nausea and diarrhea, follow these rules to avoid it but these symptoms get better with time on the medication.    ALSO You can take imodium per box instructions when starting metformin if needed.   Rules of metformin: 1) start out slow with only one pill daily. Our goal for you is 4 pills a day or 2000mg  total.  2) take with your largest meal. 3) Take with least amount of carbs.   Call if you have any problems.   Check out  Mini habits for weight loss book  2 apps for tracking food is myfitness pal  loseit OR can take picture of your food  Veggies are great because you can eat a ton! They are low in calories, great to fill you up, and have a ton of vitamins, minerals, and protein.         When it comes to diets, agreement about the perfect plan isn't easy to find, even among the experts. Experts at the [place] developed an idea known as the Healthy Eating Plate. Just imagine a plate divided into logical, healthy portions.  The emphasis is on diet quality:  Load up on vegetables and fruits - one-half of your plate: Aim for color and variety, and remember that potatoes don't count.  Go for whole grains - one-quarter of your plate: Whole wheat, barley, wheat berries, quinoa, oats, brown rice, and foods made with them. If you want pasta, go with whole wheat pasta.  Protein power - one-quarter of your plate: Fish, chicken, beans, and nuts are all healthy, versatile protein sources. Limit red meat.  The diet, however, does go  beyond the plate, offering a few other suggestions.  Use healthy plant oils, such as olive, canola, soy, corn, sunflower and peanut. Check the labels, and avoid partially hydrogenated oil, which have unhealthy trans fats.  If you're thirsty, drink water. Coffee and tea are good in moderation, but skip sugary drinks and limit milk and dairy products to one or two daily servings.  The type of carbohydrate in the diet is more important than the amount. Some sources of carbohydrates, such as vegetables, fruits, whole grains, and beans-are healthier than others.  Finally, stay active.

## 2017-10-23 ENCOUNTER — Other Ambulatory Visit: Payer: Self-pay | Admitting: Physician Assistant

## 2017-10-23 DIAGNOSIS — D509 Iron deficiency anemia, unspecified: Secondary | ICD-10-CM | POA: Diagnosis not present

## 2017-10-23 DIAGNOSIS — E119 Type 2 diabetes mellitus without complications: Secondary | ICD-10-CM | POA: Diagnosis not present

## 2017-10-23 DIAGNOSIS — Z79899 Other long term (current) drug therapy: Secondary | ICD-10-CM | POA: Diagnosis not present

## 2017-10-23 DIAGNOSIS — E0590 Thyrotoxicosis, unspecified without thyrotoxic crisis or storm: Secondary | ICD-10-CM | POA: Diagnosis not present

## 2017-10-23 DIAGNOSIS — E785 Hyperlipidemia, unspecified: Secondary | ICD-10-CM | POA: Diagnosis not present

## 2017-10-24 LAB — CBC WITH DIFFERENTIAL/PLATELET
BASOS ABS: 0 10*3/uL (ref 0.0–0.2)
Basos: 0 %
EOS (ABSOLUTE): 0.3 10*3/uL (ref 0.0–0.4)
Eos: 4 %
HEMOGLOBIN: 13.8 g/dL (ref 11.1–15.9)
Hematocrit: 40.4 % (ref 34.0–46.6)
IMMATURE GRANS (ABS): 0 10*3/uL (ref 0.0–0.1)
Immature Granulocytes: 0 %
Lymphocytes Absolute: 2.9 10*3/uL (ref 0.7–3.1)
Lymphs: 43 %
MCH: 33.5 pg — ABNORMAL HIGH (ref 26.6–33.0)
MCHC: 34.2 g/dL (ref 31.5–35.7)
MCV: 98 fL — ABNORMAL HIGH (ref 79–97)
MONOCYTES: 5 %
Monocytes Absolute: 0.3 10*3/uL (ref 0.1–0.9)
Neutrophils Absolute: 3.2 10*3/uL (ref 1.4–7.0)
Neutrophils: 48 %
Platelets: 291 10*3/uL (ref 150–379)
RBC: 4.12 x10E6/uL (ref 3.77–5.28)
RDW: 12.3 % (ref 12.3–15.4)
WBC: 6.8 10*3/uL (ref 3.4–10.8)

## 2017-10-24 LAB — BASIC METABOLIC PANEL
BUN/Creatinine Ratio: 15 (ref 9–23)
BUN: 10 mg/dL (ref 6–24)
CO2: 22 mmol/L (ref 20–29)
CREATININE: 0.66 mg/dL (ref 0.57–1.00)
Calcium: 9.4 mg/dL (ref 8.7–10.2)
Chloride: 103 mmol/L (ref 96–106)
GFR calc Af Amer: 127 mL/min/{1.73_m2} (ref >59)
GFR calc non Af Amer: 110 mL/min/{1.73_m2} (ref >59)
GLUCOSE: 119 mg/dL — AB (ref 65–99)
Potassium: 3.8 mmol/L (ref 3.5–5.2)
SODIUM: 140 mmol/L (ref 134–144)

## 2017-10-24 LAB — IRON AND TIBC
Iron Saturation: 19 % (ref 15–55)
Iron: 79 ug/dL (ref 27–159)
TIBC: 408 ug/dL (ref 250–450)
UIBC: 329 ug/dL (ref 131–425)

## 2017-10-24 LAB — HEPATIC FUNCTION PANEL
ALK PHOS: 42 IU/L (ref 39–117)
ALT: 50 IU/L — ABNORMAL HIGH (ref 0–32)
AST: 33 IU/L (ref 0–40)
Albumin: 4.5 g/dL (ref 3.5–5.5)
Bilirubin Total: 0.2 mg/dL (ref 0.0–1.2)
Bilirubin, Direct: 0.09 mg/dL (ref 0.00–0.40)
TOTAL PROTEIN: 7 g/dL (ref 6.0–8.5)

## 2017-10-24 LAB — TSH: TSH: 1.34 u[IU]/mL (ref 0.450–4.500)

## 2017-10-24 LAB — MAGNESIUM: MAGNESIUM: 2.1 mg/dL (ref 1.6–2.3)

## 2017-10-24 LAB — FERRITIN: Ferritin: 285 ng/mL — ABNORMAL HIGH (ref 15–150)

## 2017-10-24 LAB — HGB A1C W/O EAG: Hgb A1c MFr Bld: 6.2 % — ABNORMAL HIGH (ref 4.8–5.6)

## 2017-10-24 LAB — CHOLESTEROL, TOTAL: Cholesterol, Total: 206 mg/dL — ABNORMAL HIGH (ref 100–199)

## 2017-10-24 LAB — VITAMIN D 25 HYDROXY (VIT D DEFICIENCY, FRACTURES): Vit D, 25-Hydroxy: 23.2 ng/mL — ABNORMAL LOW (ref 30.0–100.0)

## 2017-11-13 ENCOUNTER — Telehealth: Payer: Self-pay

## 2017-11-13 ENCOUNTER — Other Ambulatory Visit: Payer: Self-pay | Admitting: Adult Health

## 2017-11-13 MED ORDER — PREDNISONE 20 MG PO TABS: ORAL_TABLET | ORAL | 0 refills | Status: DC

## 2017-11-13 MED ORDER — AZITHROMYCIN 250 MG PO TABS: ORAL_TABLET | ORAL | 1 refills | Status: AC

## 2017-11-13 NOTE — Telephone Encounter (Signed)
Patient has not been feeling well since last night. Has sinus drainage, sinus pressure and has mucus that is not clear but not green. Has swelling and pressure on right side of face under eye near nose. Requesting meds be sent in. Has taken allegra and has used hot/cold compresses.   Pharmacy is [facility] in [place]

## 2017-11-13 NOTE — Telephone Encounter (Signed)
Patient notified

## 2017-12-15 ENCOUNTER — Other Ambulatory Visit: Payer: Self-pay | Admitting: Physician Assistant

## 2017-12-17 DIAGNOSIS — Z7689 Persons encountering health services in other specified circumstances: Secondary | ICD-10-CM | POA: Diagnosis not present

## 2017-12-24 NOTE — Progress Notes (Signed)
HEMATOLOGY/ONCOLOGY CONSULTATION NOTE  Date of Service: 12/25/2017  Patient Care Team: Unk Pinto, MD as PCP - General (Internal Medicine)  CHIEF COMPLAINTS/PURPOSE OF CONSULTATION:  Iron deficiency anemia   HISTORY OF PRESENTING ILLNESS: 05/01/17  Lisa [name] is a wonderful 42 y.o. female who has been referred to [place] by Dr Melford Aase, Gwyndolyn Saxon, MD for evaluation and management of iron deficiency.   She has a history of obesity and DM and had a gastric bypass surgery 6 years ago. Her recent labs with her PCP as of 03/29/2017.showed ferritin level 8. hgb 9.9. Platelets 436k.   She states she is doing well overall but has being more fatigued. She reports being anemic since her bypass surgery 6 years ago. She states that she is taking daily OTC multi-vitamin 2x daily and ferrous sulfate 2x daily. She tolerates it well but she states it does cause mild constipation which is alleviated by stool softeners. She reports 2 heavy menstrual losses in the past 6 months and irregular cycle. She voices she would like to have labs done through [facility] due to cost coverage as an employee.  She was noted to have iron deficiency with ferritin of around 10 or less for >1 yr despite being on ferrous sulfate twice daily. She is on PPI's which also has affected her iron absorption.  On review of systems, pt reports severe fatigue, heavy menstrual losses and denies bloody stools, black stools, fever, chills, night sweats and any other accompanying symptoms.    INTERVAL HISTORY   Lisa [name] is here for a follow up and management of her iron deficiency anemia. The patient's last visit with [place] was on 06/27/17. The pt reports that she is doing well overall.   The pt reports that she tolerated her last IV Iron in April very well. She notes that her periods have been heavier during the last two months and she notes that she has been bruising a little easier, while taking Excedrin. She notes that she is on  Nuvaring which has not controlled her periods and will be seeing her OBGYN later today for a discussion. She endorses stable energy levels.   She notes that her thyroid levels have been stable. She is taking OTC Vitamin D and is not sure about the dose. She continues active follow up with her PCP and will see her next in August.   Lab results (12/17/17) of CBC w/diff is as follows: all values are WNL except for MCV at 98, MCH at 33.2. Iron/TIBC 12/17/17 was WNL Vitamin B12 on 12/17/17 was WNL at 1202 Ferritin 12/17/17 was high at 168  On review of systems, pt reports some bruising, stable energy levels, heavy periods, intentional weight loss, and denies abdominal pains, leg swelling, and any other symptoms.   MEDICAL HISTORY:  Past Medical History:  Diagnosis Date  . Abdominal cramping 08/10/2011  . Anxiety   . Asthma   . Depression   . Habitual abortion history, antepartum   . Headache(784.0)   . History of diabetes mellitus    prior to gastric bypass in 2012  . History of recurrent miscarriages, not currently pregnant 10/11/2011  . Hypothyroidism   . Infertility associated with anovulation   . Obesity    BMi 33  . Seizures ([place])   . UTI (lower urinary tract infection) 08/14/2011    SURGICAL HISTORY: Past Surgical History:  Procedure Laterality Date  . DILATION AND CURETTAGE OF UTERUS    . GASTRIC BYPASS  2012  SOCIAL HISTORY: Social History   Socioeconomic History  . Marital status: Married    Spouse name: Not on file  . Number of children: Not on file  . Years of education: Not on file  . Highest education level: Not on file  Occupational History  . Not on file  Social Needs  . Financial resource strain: Not on file  . Food insecurity:    Worry: Not on file    Inability: Not on file  . Transportation needs:    Medical: Not on file    Non-medical: Not on file  Tobacco Use  . Smoking status: Former Smoker    Types: Cigarettes    Last attempt to quit: 11/12/1999      Years since quitting: 18.1  . Smokeless tobacco: Never Used  Substance and Sexual Activity  . Alcohol use: Yes    Alcohol/week: 1.2 oz    Types: 2 Cans of beer per week    Comment: weekly  . Drug use: No  . Sexual activity: Yes    Birth control/protection: Other-see comments    Comment: nuva ring  Lifestyle  . Physical activity:    Days per week: Not on file    Minutes per session: Not on file  . Stress: Not on file  Relationships  . Social connections:    Talks on phone: Not on file    Gets together: Not on file    Attends religious service: Not on file    Active member of club or organization: Not on file    Attends meetings of clubs or organizations: Not on file    Relationship status: Not on file  . Intimate partner violence:    Fear of current or ex partner: Not on file    Emotionally abused: Not on file    Physically abused: Not on file    Forced sexual activity: Not on file  Other Topics Concern  . Not on file  Social History Narrative  . Not on file    FAMILY HISTORY: Family History  Problem Relation Age of Onset  . Depression Mother   . Diabetes Mother   . Anorexia nervosa Sister   . Alcohol abuse Maternal Uncle   . Anxiety disorder Maternal Uncle   . Depression Maternal Uncle   . Alzheimer's disease Maternal Grandfather   . Depression Maternal Grandmother   . Alzheimer's disease Maternal Grandmother   . Diabetes Maternal Grandmother   . Alzheimer's disease Paternal Grandmother     ALLERGIES:  is allergic to codeine.  MEDICATIONS:  Current Outpatient Medications  Medication Sig Dispense Refill  . cyclobenzaprine (FLEXERIL) 10 MG tablet Take 1 tablet (10 mg total) by mouth at bedtime. 90 tablet 1  . diazepam (VALIUM) 5 MG tablet 1/2 to 1 daily as needed for severe anxiety 30 tablet 0  . etonogestrel-ethinyl estradiol (NUVARING) 0.12-0.015 MG/24HR vaginal ring Insert one per vagina every 21 days for a total of 4 consecutive readings. Take a  three-day break and then start with one ring every 21 days for a total of 4 readings again Please note this requires further rings every 3 months 4 each 3  . levothyroxine (SYNTHROID, LEVOTHROID) 300 MCG tablet TAKE 1 TABLET BY MOUTH  DAILY BEFORE BREAKFAST 90 tablet 3  . metFORMIN (GLUCOPHAGE-XR) 500 MG 24 hr tablet TAKE 1 TO 2 TABLETS BY  MOUTH DAILY WITH LARGEST  MEAL. START ON 1 TABLET  ONCE A DAY, AND CAN GO UP  TO 1 TABLET  TWICE A DAY. 60 tablet 3  . naltrexone (DEPADE) 50 MG tablet 1 pill before supper daily 90 tablet 1  . omeprazole (PRILOSEC) 20 MG capsule     . phentermine (ADIPEX-P) 37.5 MG tablet TAKE ONE TABLET ( 37.5 MG TOTAL) BY MOUTH DAILY BEFORE BREAKFAST 30 tablet 3  . predniSONE (DELTASONE) 20 MG tablet 2 tablets daily for 3 days, 1 tablet daily for 4 days. 10 tablet 0  . SUMAtriptan (IMITREX) 100 MG tablet Take 1 tablet (100 mg total) by mouth once as needed for migraine. May repeat in 2 hours if headache persists or recurs. 10 tablet 0  . traZODone (DESYREL) 50 MG tablet TAKE 1 TABLET BY MOUTH AT  BEDTIME 90 tablet 1  . valproic acid (DEPAKENE) 250 MG capsule TAKE 2 CAPSULES BY MOUTH 3  TIMES DAILY 540 capsule 0   No current facility-administered medications for this visit.     REVIEW OF SYSTEMS:    A 10+ POINT REVIEW OF SYSTEMS WAS OBTAINED including neurology, dermatology, psychiatry, cardiac, respiratory, lymph, extremities, GI, GU, Musculoskeletal, constitutional, breasts, reproductive, HEENT.  All pertinent positives are noted in the HPI.  All others are negative.   PHYSICAL EXAMINATION: ECOG PERFORMANCE STATUS: 1 - Symptomatic but completely ambulatory  . Vitals:   12/25/17 1148  BP: 129/84  Pulse: 82  Resp: 18  Temp: 98.7 F (37.1 C)  SpO2: 100%   Filed Weights   12/25/17 1148  Weight: 213 lb (96.6 kg)   .Body mass index is 32.39 kg/m.  GENERAL:alert, in no acute distress and comfortable SKIN: no acute rashes, no significant lesions EYES:  conjunctiva are pink and non-injected, sclera anicteric OROPHARYNX: MMM, no exudates, no oropharyngeal erythema or ulceration NECK: supple, no JVD LYMPH:  no palpable lymphadenopathy in the cervical, axillary or inguinal regions LUNGS: clear to auscultation b/l with normal respiratory effort HEART: regular rate & rhythm ABDOMEN:  normoactive bowel sounds , non tender, not distended. No palpable hepatosplenomegaly.  Extremity: no pedal edema PSYCH: alert & oriented x 3 with fluent speech NEURO: no focal motor/sensory deficits   LABORATORY DATA:  I have reviewed the data as listed  . CBC Latest Ref Rng & Units 10/23/2017 03/29/2017 10/15/2016  WBC 3.4 - 10.8 x10E3/uL 6.8 6.8 8.2  Hemoglobin 11.1 - 15.9 g/dL 13.8 9.9(L) 9.2(L)  Hematocrit 34.0 - 46.6 % 40.4 32.4(L) 31.2(L)  Platelets 150 - 379 x10E3/uL 291 436(H) 349   . CBC    Component Value Date/Time   WBC 6.8 10/23/2017 1458   RBC 4.12 10/23/2017 1458   HGB 13.8 10/23/2017 1458   HCT 40.4 10/23/2017 1458   PLT 291 10/23/2017 1458   MCV 98 (H) 10/23/2017 1458   MCH 33.5 (H) 10/23/2017 1458   MCHC 34.2 10/23/2017 1458   RDW 12.3 10/23/2017 1458   LYMPHSABS 2.9 10/23/2017 1458   EOSABS 0.3 10/23/2017 1458   BASOSABS 0.0 10/23/2017 1458    . CMP Latest Ref Rng & Units 10/23/2017 03/29/2017 10/15/2016  Glucose 65 - 99 mg/dL 119(H) 157(H) 101(H)  BUN 6 - 24 mg/dL 10 8 10   Creatinine 0.57 - 1.00 mg/dL 0.66 0.78 0.71  Sodium 134 - 144 mmol/L 140 140 143  Potassium 3.5 - 5.2 mmol/L 3.8 4.2 4.9  Chloride 96 - 106 mmol/L 103 103 105  CO2 20 - 29 mmol/L 22 20 21   Calcium 8.7 - 10.2 mg/dL 9.4 8.5(L) 9.2  Total Protein 6.0 - 8.5 g/dL 7.0 7.0 -  Total Bilirubin 0.0 -  1.2 mg/dL 0.2 0.2 -  Alkaline Phos 39 - 117 IU/L 42 56 -  AST 0 - 40 IU/L 33 22 -  ALT 0 - 32 IU/L 50(H) 24 -     RADIOGRAPHIC STUDIES: I have personally reviewed the radiological images as listed and agreed with the findings in the report. No results  found.  ASSESSMENT & PLAN:   Lisa [name] is a wonderful 42 y.o. female   1) Moderate microcytic Anemia due to Iron deficiency -anemia resolved.  2) Severe Iron deficiency anemia - not responsive to PO iron -Discussed possible risk factors (bypass surgery, heavy menstrual loss, chronic PPI)   3) Fatigue due to Iron deficiency  PLAN  -her iron deficiency has been refractory to adequate po iron intake for >1 year likely due to poor po absorption -Discussed pt labwork from 12/17/17; HGB normal at 13.5. Ferritin was slightly elevated at 168. Vitamin B12 was normal at 1202 -Recommend 50k unit Ergocalciferol once a week for short-term Vitamin D replacement -No indication for IV Iron at this time -patient tolerated IV Injectafer without issues. -continue B complex. -Will see pt back in 6 months unless Ferritin drops below 100 -Continue follow up with PCP every 3 months with labs   RTC with Dr Irene Limbo in 6 months  (patient will have labs done at [place])   All of the patients questions were answered with apparent satisfaction. The patient knows to call the clinic with any problems, questions or concerns.  The total time spent in the appt was 20 minutes and more than 50% was on counseling and direct patient cares.     Sullivan Lone MD MS AAHIVMS [facility] Hematology/Oncology Physician [facility]  (Office):       [phone] (Work cell):  [phone] (Fax):           [phone]  12/25/2017 12:21 PM   I, Baldwin [place], am acting as a Education administrator for Dr Irene Limbo.   .I have reviewed the above documentation for accuracy and completeness, and I agree with the above. Brunetta Genera MD

## 2017-12-25 ENCOUNTER — Telehealth: Payer: Self-pay | Admitting: Hematology

## 2017-12-25 ENCOUNTER — Inpatient Hospital Stay: Payer: 59 | Attending: Hematology | Admitting: Hematology

## 2017-12-25 VITALS — BP 129/84 | HR 82 | Temp 98.7°F | Resp 18 | Ht 68.0 in | Wt 213.0 lb

## 2017-12-25 DIAGNOSIS — E538 Deficiency of other specified B group vitamins: Secondary | ICD-10-CM | POA: Diagnosis not present

## 2017-12-25 DIAGNOSIS — D509 Iron deficiency anemia, unspecified: Secondary | ICD-10-CM | POA: Diagnosis not present

## 2017-12-25 DIAGNOSIS — Z3040 Encounter for surveillance of contraceptives, unspecified: Secondary | ICD-10-CM | POA: Diagnosis not present

## 2017-12-25 DIAGNOSIS — Z6833 Body mass index (BMI) 33.0-33.9, adult: Secondary | ICD-10-CM | POA: Diagnosis not present

## 2017-12-25 DIAGNOSIS — E039 Hypothyroidism, unspecified: Secondary | ICD-10-CM | POA: Insufficient documentation

## 2017-12-25 DIAGNOSIS — E669 Obesity, unspecified: Secondary | ICD-10-CM | POA: Diagnosis not present

## 2017-12-25 DIAGNOSIS — E119 Type 2 diabetes mellitus without complications: Secondary | ICD-10-CM | POA: Insufficient documentation

## 2017-12-25 DIAGNOSIS — Z1231 Encounter for screening mammogram for malignant neoplasm of breast: Secondary | ICD-10-CM | POA: Diagnosis not present

## 2017-12-25 DIAGNOSIS — Z124 Encounter for screening for malignant neoplasm of cervix: Secondary | ICD-10-CM | POA: Diagnosis not present

## 2017-12-25 DIAGNOSIS — Z87891 Personal history of nicotine dependence: Secondary | ICD-10-CM | POA: Diagnosis not present

## 2017-12-25 DIAGNOSIS — N943 Premenstrual tension syndrome: Secondary | ICD-10-CM | POA: Diagnosis not present

## 2017-12-25 DIAGNOSIS — Z01419 Encounter for gynecological examination (general) (routine) without abnormal findings: Secondary | ICD-10-CM | POA: Diagnosis not present

## 2017-12-25 NOTE — Telephone Encounter (Signed)
Scheduled appt per 7/3 los - pt aware of appts - per patient no print out wanted.

## 2018-01-10 ENCOUNTER — Other Ambulatory Visit: Payer: Self-pay | Admitting: Internal Medicine

## 2018-01-10 ENCOUNTER — Ambulatory Visit: Payer: 59

## 2018-01-10 DIAGNOSIS — N390 Urinary tract infection, site not specified: Secondary | ICD-10-CM | POA: Diagnosis not present

## 2018-01-10 DIAGNOSIS — R319 Hematuria, unspecified: Secondary | ICD-10-CM

## 2018-01-10 MED ORDER — FLUCONAZOLE 150 MG PO TABS: ORAL_TABLET | ORAL | 1 refills | Status: DC

## 2018-01-10 MED ORDER — CIPROFLOXACIN HCL 250 MG PO TABS: 250.0000 mg | ORAL_TABLET | Freq: Two times a day (BID) | ORAL | 0 refills | Status: AC

## 2018-01-10 NOTE — Progress Notes (Signed)
Patient presents to the office complaining of urinary frequency, urgency and dysuria with bleeding. Sample provided and given to lab for testing. Patient advised to push fluids, may use Pyridium OTC prn.

## 2018-01-13 LAB — URINALYSIS, COMPLETE
BILIRUBIN URINE: NEGATIVE
Hyaline Cast: NONE SEEN /LPF
Nitrite: NEGATIVE
PH: 6 (ref 5.0–8.0)
PROTEIN: NEGATIVE
SPECIFIC GRAVITY, URINE: 1.028 (ref 1.001–1.03)
Squamous Epithelial / LPF: NONE SEEN /HPF (ref <=5)

## 2018-01-13 LAB — URINE CULTURE
MICRO NUMBER: 90860178
SPECIMEN QUALITY:: ADEQUATE

## 2018-01-15 ENCOUNTER — Other Ambulatory Visit: Payer: Self-pay | Admitting: Internal Medicine

## 2018-01-15 DIAGNOSIS — R300 Dysuria: Secondary | ICD-10-CM

## 2018-01-21 DIAGNOSIS — N939 Abnormal uterine and vaginal bleeding, unspecified: Secondary | ICD-10-CM | POA: Diagnosis not present

## 2018-01-21 DIAGNOSIS — R300 Dysuria: Secondary | ICD-10-CM | POA: Diagnosis not present

## 2018-02-18 NOTE — Progress Notes (Signed)
FOLLOW UP  Assessment and Plan:  Controlled type 2 diabetes mellitus without complication, without long-term current use of insulin ([place]) Get back on metformin Discussed general issues about diabetes pathophysiology and management., Educational material distributed., Suggested low cholesterol diet., Encouraged aerobic exercise., Discussed foot care., Reminded to get yearly retinal exam.  Hypothyroidism, unspecified type Hypothyroidism-check TSH level, continue medications the same, reminded to take on an empty stomach 30-26mins before food.   Seizure disorder [facility]) Continue medications  Dissociative identity disorder [facility]) Continue meds and follow up psych.   Recurrent major depressive disorder, in partial remission ([place]) Continue meds and follow up psych.   Hyperlipidemia LDL goal <100 - check lipids, decrease fatty foods, increase activity.   Medication management LABS SENT TO [facility]  Iron deficiency anemia, unspecified iron deficiency anemia type Check iron/ferritin, s/p transfusion, following Dr. Irene Limbo  Obesity surgery status Monitor labs, continue weight loss  Class 1 obesity due to excess calories with serious comorbidity and body mass index (BMI) of 33.0 to 33.9 in adult -     phentermine (ADIPEX-P) 37.5 MG tablet; TAKE ONE TABLET ( 37.5 MG TOTAL) BY MOUTH DAILY BEFORE BREAKFAST - naltraxone 50mg  at night   Discussed med's effects and SE's. Screening labs and tests as requested with regular follow-up as recommended. LAB DONE AT [facility], HAS RX FOR LABS AND WILL GET DONE Future Appointments  Date Time Provider [place]  06/27/2018  9:20 AM Brunetta Genera, MD [facility] None  07/31/2018  3:00 PM Vicie Mutters, PA-C [facility] None    HPI  42 y.o. female  presents for  follow up for chol, DM, seizures, anemia.    Her blood pressure has been controlled at home, today their BP is BP: 120/80 She does not workout due to the weather.  She denies chest  pain, shortness of breath, dizziness.   She is not on cholesterol medication and denies myalgias. Her cholesterol is at goal. The cholesterol last visit was:   Lab Results  Component Value Date   CHOL 206 (H) 10/23/2017   HDL 67 10/15/2016   Elizabeth 88 10/15/2016   TRIG 144 10/15/2016    She has been working on diet and exercise for diabetes, she has been off metformin and wants to get back on it, and denies paresthesia of the feet, polydipsia, polyuria and visual disturbances. Last A1C in the office was:  Lab Results  Component Value Date   HGBA1C 6.2 (H) 10/23/2017   Patient is on Vitamin D supplement.   Lab Results  Component Value Date   VD25OH 23.2 (L) 10/23/2017     She is on thyroid medication. Her medication was not changed last visit.   Lab Results  Component Value Date   TSH 1.340 10/23/2017  .   She is on trazodone for sleep, takes PRN, uses once a month.  She is on the valium PRN as well for anxiety.   She is on the Mertens ring and is on it continuously. She has a history of miscarriages and infertility HOWEVER has seen her GYN.   Due to her gastric bypass she has has severe symptomatic iron def anemia, she has had an iron transfusion and is feeling better.  Lab Results  Component Value Date   IRON 79 10/23/2017   TIBC 408 10/23/2017   FERRITIN 285 (H) 10/23/2017   She has had epilepsy since she was 71, on valproic acid 250mg , has not had a seizure in a decade, had a trial  off but had seizures. Should remain on medication. Follows with Dr. Tommy Medal.  She is 6 years s/p gastric bypass, she was 260 before the surgery and got down to 167.  She would like to get back to 180.  BMI is Body mass index is 33.45 kg/m., she is working on diet and exercise. She is on naltrexone due to not doing well with the wellbutrin, it was causing tremor/shakes, and will take phentermine AS needed.  Wt Readings from Last 10 Encounters:  02/19/18 220 lb (99.8 kg)  12/25/17 213 lb (96.6  kg)  10/21/17 222 lb 9.6 oz (101 kg)  07/16/17 220 lb (99.8 kg)  06/27/17 216 lb 4.8 oz (98.1 kg)  05/08/17 224 lb 3.2 oz (101.7 kg)  05/01/17 225 lb (102.1 kg)  10/17/16 220 lb 6.4 oz (100 kg)  04/30/16 222 lb (100.7 kg)  02/22/16 208 lb 6.4 oz (94.5 kg)    Current Outpatient Medications on File Prior to Visit  Medication Sig  . ARIPiprazole (ABILIFY PO) Take by mouth.  . cyclobenzaprine (FLEXERIL) 10 MG tablet Take 1 tablet (10 mg total) by mouth at bedtime.  . diazepam (VALIUM) 5 MG tablet 1/2 to 1 daily as needed for severe anxiety  . etonogestrel-ethinyl estradiol (NUVARING) 0.12-0.015 MG/24HR vaginal ring Insert one per vagina every 21 days for a total of 4 consecutive readings. Take a three-day break and then start with one ring every 21 days for a total of 4 readings again Please note this requires further rings every 3 months  . levothyroxine (SYNTHROID, LEVOTHROID) 300 MCG tablet TAKE 1 TABLET BY MOUTH  DAILY BEFORE BREAKFAST  . metFORMIN (GLUCOPHAGE-XR) 500 MG 24 hr tablet TAKE 1 TO 2 TABLETS BY  MOUTH DAILY WITH LARGEST  MEAL. START ON 1 TABLET  ONCE A DAY, AND CAN GO UP  TO 1 TABLET TWICE A DAY.  . naltrexone (DEPADE) 50 MG tablet 1 pill before supper daily  . omeprazole (PRILOSEC) 20 MG capsule   . phentermine (ADIPEX-P) 37.5 MG tablet TAKE ONE TABLET ( 37.5 MG TOTAL) BY MOUTH DAILY BEFORE BREAKFAST  . SUMAtriptan (IMITREX) 100 MG tablet Take 1 tablet (100 mg total) by mouth once as needed for migraine. May repeat in 2 hours if headache persists or recurs.  . traZODone (DESYREL) 50 MG tablet TAKE 1 TABLET BY MOUTH AT  BEDTIME  . valproic acid (DEPAKENE) 250 MG capsule TAKE 2 CAPSULES BY MOUTH 3  TIMES DAILY  . fluconazole (DIFLUCAN) 150 MG tablet Take 1 tablet weekly if needed for yeast infection (Patient not taking: Reported on 02/19/2018)   No current facility-administered medications on file prior to visit.    Medical History:  Past Medical History:  Diagnosis Date  .  Abdominal cramping 08/10/2011  . Anxiety   . Asthma   . Depression   . Habitual abortion history, antepartum   . Headache(784.0)   . History of diabetes mellitus    prior to gastric bypass in 2012  . History of recurrent miscarriages, not currently pregnant 10/11/2011  . Hypothyroidism   . Infertility associated with anovulation   . Obesity    BMi 33  . Seizures ([place])   . UTI (lower urinary tract infection) 08/14/2011   Allergies Allergies  Allergen Reactions  . Codeine Itching, Rash and Other (See Comments)    OPIOIDS - MORPHINE & RELATED    SURGICAL HISTORY She  has a past surgical history that includes Gastric bypass (2012) and Dilation and curettage of  uterus. FAMILY HISTORY Her family history includes Alcohol abuse in her maternal uncle; Alzheimer's disease in her maternal grandfather, maternal grandmother, and paternal grandmother; Anorexia nervosa in her sister; Anxiety disorder in her maternal uncle; Depression in her maternal grandmother, maternal uncle, and mother; Diabetes in her maternal grandmother and mother. SOCIAL HISTORY She  reports that she quit smoking about 18 years ago. Her smoking use included cigarettes. She has never used smokeless tobacco. She reports that she drinks about 2.0 standard drinks of alcohol per week. She reports that she does not use drugs.  Review of Systems: Review of Systems  Constitutional: Negative for chills, diaphoresis, fever, malaise/fatigue and weight loss.  HENT: Negative.   Respiratory: Negative.   Cardiovascular: Negative.   Gastrointestinal: Negative for abdominal pain, blood in stool, constipation, diarrhea, heartburn, melena, nausea and vomiting.  Genitourinary: Negative.   Musculoskeletal: Negative.   Skin: Negative.   Neurological: Negative.  Negative for weakness.  Psychiatric/Behavioral: Negative for depression, hallucinations, memory loss, substance abuse and suicidal ideas. The patient is not nervous/anxious and does  not have insomnia.     Physical Exam: Estimated body mass index is 33.45 kg/m as calculated from the following:   Height as of this encounter: 5\' 8"  (1.727 m).   Weight as of this encounter: 220 lb (99.8 kg). BP 120/80   Pulse 66   Temp 97.7 F (36.5 C)   Resp 16   Ht 5\' 8"  (1.727 m)   Wt 220 lb (99.8 kg)   SpO2 98%   BMI 33.45 kg/m  General Appearance: Well nourished, in no apparent distress.  Eyes: PERRLA, EOMs, conjunctiva no swelling or erythema, normal fundi and vessels.  Sinuses: No Frontal/maxillary tenderness  ENT/Mouth: Ext aud canals clear, normal light reflex with TMs without erythema, bulging. Good dentition. No erythema, swelling, or exudate on post pharynx. Tonsils not swollen or erythematous. Hearing normal. Crowded mouth.  Neck: Supple, thyroid normal. No bruits  Respiratory: Respiratory effort normal, BS equal bilaterally without rales, rhonchi, wheezing or stridor.  Cardio: RRR without murmurs, rubs or gallops. Prominent S2. Brisk peripheral pulses without edema.  Chest: symmetric, with normal excursions and percussion.  Abdomen: Soft, nontender, no guarding, rebound, hernias, masses, or organomegaly. .  Lymphatics: Non tender without lymphadenopathy.  Musculoskeletal: Full ROM all peripheral extremities,5/5 strength, and normal gait.  Skin: Warm, dry without rashes, lesions, ecchymosis. Neuro: Cranial nerves intact, reflexes equal bilaterally. Normal muscle tone, no cerebellar symptoms. Sensation intact.  Psych: Awake and oriented X 3, normal affect, Insight and Judgment appropriate.    Vicie Mutters 4:12 PM [facility]

## 2018-02-19 ENCOUNTER — Encounter: Payer: Self-pay | Admitting: Physician Assistant

## 2018-02-19 ENCOUNTER — Ambulatory Visit: Payer: Self-pay | Admitting: Physician Assistant

## 2018-02-19 ENCOUNTER — Ambulatory Visit: Payer: 59 | Admitting: Physician Assistant

## 2018-02-19 VITALS — BP 120/80 | HR 66 | Temp 97.7°F | Resp 16 | Ht 68.0 in | Wt 220.0 lb

## 2018-02-19 DIAGNOSIS — G40909 Epilepsy, unspecified, not intractable, without status epilepticus: Secondary | ICD-10-CM

## 2018-02-19 DIAGNOSIS — Z79899 Other long term (current) drug therapy: Secondary | ICD-10-CM

## 2018-02-19 DIAGNOSIS — E119 Type 2 diabetes mellitus without complications: Secondary | ICD-10-CM | POA: Diagnosis not present

## 2018-02-19 DIAGNOSIS — E559 Vitamin D deficiency, unspecified: Secondary | ICD-10-CM

## 2018-02-19 DIAGNOSIS — F4481 Dissociative identity disorder: Secondary | ICD-10-CM | POA: Diagnosis not present

## 2018-02-19 DIAGNOSIS — E785 Hyperlipidemia, unspecified: Secondary | ICD-10-CM

## 2018-02-19 DIAGNOSIS — E6609 Other obesity due to excess calories: Secondary | ICD-10-CM

## 2018-02-19 DIAGNOSIS — Z6833 Body mass index (BMI) 33.0-33.9, adult: Secondary | ICD-10-CM

## 2018-02-19 DIAGNOSIS — E039 Hypothyroidism, unspecified: Secondary | ICD-10-CM

## 2018-02-19 MED ORDER — PHENTERMINE HCL 37.5 MG PO TABS: ORAL_TABLET | ORAL | 3 refills | Status: DC

## 2018-02-19 NOTE — Patient Instructions (Signed)
We are starting you on Metformin to prevent or treat diabetes.  Metformin does NOT cause low blood sugars.   In order to create energy your cells need insulin and sugar but sometime your cells do not accept the insulin and this can cause increased sugars and decreased energy.   The Metformin helps your cells accept insulin and the sugar this help: 1) increase your energy  2) weight loss.    The two most common side effects are nausea and diarrhea, follow these rules to avoid it but these symptoms get better with time on the medication.    ALSO You can take imodium per box instructions when starting metformin if needed.   Rules of metformin: 1) start out slow with only one pill daily. Our goal for you is 4 pills a day or 2000mg  total.  2) take with your largest meal. 3) Take with least amount of carbs.   Call if you have any problems.

## 2018-03-03 ENCOUNTER — Encounter: Payer: Self-pay | Admitting: Physician Assistant

## 2018-03-14 ENCOUNTER — Encounter: Payer: Self-pay | Admitting: Physician Assistant

## 2018-03-14 ENCOUNTER — Ambulatory Visit: Payer: 59 | Admitting: Physician Assistant

## 2018-03-14 VITALS — BP 140/90 | HR 82 | Temp 97.2°F | Resp 14 | Ht 68.0 in | Wt 216.0 lb

## 2018-03-14 DIAGNOSIS — J0100 Acute maxillary sinusitis, unspecified: Secondary | ICD-10-CM | POA: Diagnosis not present

## 2018-03-14 MED ORDER — FLUTICASONE PROPIONATE 50 MCG/ACT NA SUSP: 2.0000 | Freq: Every day | NASAL | 1 refills | Status: DC

## 2018-03-14 MED ORDER — PROMETHAZINE-DM 6.25-15 MG/5ML PO SYRP: 5.0000 mL | ORAL_SOLUTION | Freq: Four times a day (QID) | ORAL | 1 refills | Status: DC | PRN

## 2018-03-14 MED ORDER — FLUCONAZOLE 150 MG PO TABS: 150.0000 mg | ORAL_TABLET | Freq: Every day | ORAL | 3 refills | Status: DC

## 2018-03-14 MED ORDER — DOXYCYCLINE HYCLATE 100 MG PO CAPS: ORAL_CAPSULE | ORAL | 0 refills | Status: DC

## 2018-03-14 NOTE — Progress Notes (Signed)
Subjective:    Patient ID: Lisa [name], female    DOB: 11-Oct-1975, 42 y.o.   MRN: [id number]  HPI 42 y.o. smoking WF presents with sinus x 2 weeks.  She states started with allergies, sneezing, sore throat, drainage, now it is sinus pressure, cough, fever, chills. She has been on advil cold/sinus, sudafed, mucenix, allegra.   Blood pressure 140/90, pulse 82, temperature (!) 97.2 F (36.2 C), resp. rate 14, height 5\' 8"  (1.727 m), weight 216 lb (98 kg), SpO2 99 %.  Medications Current Outpatient Medications on File Prior to Visit  Medication Sig  . cyclobenzaprine (FLEXERIL) 10 MG tablet Take 1 tablet (10 mg total) by mouth at bedtime.  . diazepam (VALIUM) 5 MG tablet 1/2 to 1 daily as needed for severe anxiety  . etonogestrel-ethinyl estradiol (NUVARING) 0.12-0.015 MG/24HR vaginal ring Insert one per vagina every 21 days for a total of 4 consecutive readings. Take a three-day break and then start with one ring every 21 days for a total of 4 readings again Please note this requires further rings every 3 months  . levothyroxine (SYNTHROID, LEVOTHROID) 300 MCG tablet TAKE 1 TABLET BY MOUTH  DAILY BEFORE BREAKFAST  . metFORMIN (GLUCOPHAGE-XR) 500 MG 24 hr tablet TAKE 1 TO 2 TABLETS BY  MOUTH DAILY WITH LARGEST  MEAL. START ON 1 TABLET  ONCE A DAY, AND CAN GO UP  TO 1 TABLET TWICE A DAY.  Marland Kitchen omeprazole (PRILOSEC) 20 MG capsule   . phentermine (ADIPEX-P) 37.5 MG tablet TAKE ONE TABLET ( 37.5 MG TOTAL) BY MOUTH DAILY BEFORE BREAKFAST  . SUMAtriptan (IMITREX) 100 MG tablet Take 1 tablet (100 mg total) by mouth once as needed for migraine. May repeat in 2 hours if headache persists or recurs.  . traZODone (DESYREL) 50 MG tablet TAKE 1 TABLET BY MOUTH AT  BEDTIME  . valproic acid (DEPAKENE) 250 MG capsule TAKE 2 CAPSULES BY MOUTH 3  TIMES DAILY   No current facility-administered medications on file prior to visit.     Problem list She has Depression; PTSD (post-traumatic stress disorder);  Obesity surgery status; Seizure disorder ([place]); Diabetes mellitus type II, controlled ([place]); Hypothyroidism; Panic attacks; Dissociative identity disorder [facility]); Anxiety; Obesity; Vitamin D deficiency; Hyperlipidemia LDL goal <100; Medication management; and Iron deficiency anemia on their problem list.   Review of Systems  Constitutional: Negative for chills and diaphoresis.  HENT: Positive for congestion, postnasal drip, sinus pressure and sneezing. Negative for ear pain and sore throat.   Respiratory: Positive for cough. Negative for chest tightness, shortness of breath and wheezing.   Cardiovascular: Negative.   Gastrointestinal: Negative.   Genitourinary: Negative.   Musculoskeletal: Negative for neck pain.  Neurological: Positive for headaches.       Objective:   Physical Exam  Constitutional: She appears well-developed and well-nourished.  HENT:  Head: Normocephalic and atraumatic.  Right Ear: External ear normal.  Nose: Right sinus exhibits maxillary sinus tenderness. Right sinus exhibits no frontal sinus tenderness. Left sinus exhibits maxillary sinus tenderness. Left sinus exhibits no frontal sinus tenderness.  Eyes: Conjunctivae and EOM are normal.  Neck: Normal range of motion. Neck supple.  Cardiovascular: Normal rate, regular rhythm, normal heart sounds and intact distal pulses.  Pulmonary/Chest: Effort normal and breath sounds normal. No respiratory distress. She has no wheezes.  Abdominal: Soft. Bowel sounds are normal.  Lymphadenopathy:    She has cervical adenopathy.  Skin: Skin is warm and dry.       Assessment & Plan:  Acute non-recurrent maxillary sinusitis Has been over 10 days will do ABX Suggested symptomatic OTC remedies. Nasal saline spray for congestion. Nasal steroids, allergy pill, oral steroids offered but declined -     doxycycline (VIBRAMYCIN) 100 MG capsule; Take 1 capsule twice daily with food -     fluconazole (DIFLUCAN) 150 MG tablet;  Take 1 tablet (150 mg total) by mouth daily. -     promethazine-dextromethorphan (PROMETHAZINE-DM) 6.25-15 MG/5ML syrup; Take 5 mLs by mouth 4 (four) times daily as needed for cough. -     fluticasone (FLONASE) 50 MCG/ACT nasal spray; Place 2 sprays into both nostrils at bedtime.

## 2018-03-14 NOTE — Patient Instructions (Signed)
COLD INFORMATION  Try just the allergy pill and prednisone for a few days, you always want to give your body 10 days to fight off infection with help before taking an anabiotic.   IT HAS BEEN OVER 10 DAYS FOR YOU SO START THE ANTIBIOTIC  BEWARE ANTIBIOTICS Antibiotics have been linked with colon infections, resistance and newest theory is colon cancer in 40-50 year olds. So it is VERY important to try to avoid them, antibiotics are NOT risk free medications.   If you are not feeling better make an office visit OR CONTACT us.   Here is more info below HOW TO TREAT VIRAL COUGH AND COLD SYMPTOMS:  -Symptoms usually last at least 1 week with the worst symptoms being around day 4.  - colds usually start with a sore throat and end with a cough, and the cough can take 2 weeks to get better.  -No antibiotics are needed for colds, flu, sore throats, cough, bronchitis UNLESS symptoms are longer than 7 days OR if you are getting better then get drastically worse.  -There are a lot of combination medications (Dayquil, Nyquil, Vicks 44, tyelnol cold and sinus, ETC). Please look at the ingredients on the back so that you are treating the correct symptoms and not doubling up on medications/ingredients.    Medicines you can use  Nasal congestion  Little Remedies saline spray (aerosol/mist)- can try this, it is in the kids section - pseudoephedrine (Sudafed)- behind the counter, do not use if you have high blood pressure, medicine that have -D in them.  - phenylephrine (Sudafed PE) -Dextormethorphan + chlorpheniramine (Coridcidin HBP)- okay if you have high blood pressure -Oxymetazoline (Afrin) nasal spray- LIMIT to 3 days -Saline nasal spray -Neti pot (used distilled or bottled water)  Ear pain/congestion  -pseudoephedrine (sudafed) - Nasonex/flonase nasal spray  Fever  -Acetaminophen (Tyelnol) -Ibuprofen (Advil, motrin, aleve)  Sore Throat  -Acetaminophen (Tyelnol) -Ibuprofen (Advil, motrin,  aleve) -Drink a lot of water -Gargle with salt water - Rest your voice (don't talk) -Throat sprays -Cough drops  Body Aches  -Acetaminophen (Tyelnol) -Ibuprofen (Advil, motrin, aleve)  Headache  -Acetaminophen (Tyelnol) -Ibuprofen (Advil, motrin, aleve) - Exedrin, Exedrin Migraine  Allergy symptoms (cough, sneeze, runny nose, itchy eyes) -Claritin or loratadine cheapest but likely the weakest  -Zyrtec or certizine at night because it can make you sleepy -The strongest is allegra or fexafinadine  Cheapest at [employer], [employer], [employer]  Cough  -Dextromethorphan (Delsym)- medicine that has DM in it -Guafenesin (Mucinex/Robitussin) - cough drops - drink lots of water  Chest Congestion  -Guafenesin (Mucinex/Robitussin)  Red Itchy Eyes  - Naphcon-A  Upset Stomach  - Bland diet (nothing spicy, greasy, fried, and high acid foods like tomatoes, oranges, berries) -OKAY- cereal, bread, soup, crackers, rice -Eat smaller more frequent meals -reduce caffeine, no alcohol -Loperamide (Imodium-AD) if diarrhea -Prevacid for heart burn  General health when sick  -Hydration -wash your hands frequently -keep surfaces clean -change pillow cases and sheets often -Get fresh air but do not exercise strenuously -Vitamin D, double up on it - Vitamin C -Zinc

## 2018-03-21 ENCOUNTER — Other Ambulatory Visit: Payer: Self-pay | Admitting: Physician Assistant

## 2018-03-21 DIAGNOSIS — E039 Hypothyroidism, unspecified: Secondary | ICD-10-CM | POA: Diagnosis not present

## 2018-03-21 DIAGNOSIS — D509 Iron deficiency anemia, unspecified: Secondary | ICD-10-CM | POA: Diagnosis not present

## 2018-03-21 DIAGNOSIS — Z79899 Other long term (current) drug therapy: Secondary | ICD-10-CM | POA: Diagnosis not present

## 2018-03-21 DIAGNOSIS — E785 Hyperlipidemia, unspecified: Secondary | ICD-10-CM | POA: Diagnosis not present

## 2018-03-21 DIAGNOSIS — E119 Type 2 diabetes mellitus without complications: Secondary | ICD-10-CM | POA: Diagnosis not present

## 2018-03-23 LAB — SPECIMEN STATUS REPORT

## 2018-03-25 LAB — HEPATIC FUNCTION PANEL
ALBUMIN: 4.2 g/dL (ref 3.5–5.5)
ALT: 78 IU/L — ABNORMAL HIGH (ref 0–32)
AST: 47 IU/L — ABNORMAL HIGH (ref 0–40)
Alkaline Phosphatase: 43 IU/L (ref 39–117)
BILIRUBIN, DIRECT: 0.1 mg/dL (ref 0.00–0.40)
Bilirubin Total: 0.3 mg/dL (ref 0.0–1.2)
TOTAL PROTEIN: 6.6 g/dL (ref 6.0–8.5)

## 2018-03-25 LAB — URINALYSIS, ROUTINE W REFLEX MICROSCOPIC
BILIRUBIN UA: NEGATIVE
KETONES UA: NEGATIVE
Leukocytes, UA: NEGATIVE
NITRITE UA: NEGATIVE
Protein, UA: NEGATIVE
RBC, UA: NEGATIVE
SPEC GRAV UA: 1.022 (ref 1.005–1.030)
Urobilinogen, Ur: 0.2 mg/dL (ref 0.2–1.0)
pH, UA: 7.5 (ref 5.0–7.5)

## 2018-03-25 LAB — TSH: TSH: 0.088 u[IU]/mL — ABNORMAL LOW (ref 0.450–4.500)

## 2018-03-25 LAB — VITAMIN D 25 HYDROXY (VIT D DEFICIENCY, FRACTURES): Vit D, 25-Hydroxy: 35.5 ng/mL (ref 30.0–100.0)

## 2018-03-25 LAB — IRON AND TIBC
Iron Saturation: 33 % (ref 15–55)
Iron: 128 ug/dL (ref 27–159)
Total Iron Binding Capacity: 393 ug/dL (ref 250–450)
UIBC: 265 ug/dL (ref 131–425)

## 2018-03-25 LAB — URINE CULTURE

## 2018-03-25 LAB — BASIC METABOLIC PANEL
BUN/Creatinine Ratio: 18 (ref 9–23)
BUN: 12 mg/dL (ref 6–24)
CO2: 22 mmol/L (ref 20–29)
Calcium: 9.7 mg/dL (ref 8.7–10.2)
Chloride: 105 mmol/L (ref 96–106)
Creatinine, Ser: 0.67 mg/dL (ref 0.57–1.00)
GFR, EST AFRICAN AMERICAN: 126 mL/min/{1.73_m2} (ref >59)
GFR, EST NON AFRICAN AMERICAN: 110 mL/min/{1.73_m2} (ref >59)
Glucose: 122 mg/dL — ABNORMAL HIGH (ref 65–99)
Potassium: 5 mmol/L (ref 3.5–5.2)
SODIUM: 143 mmol/L (ref 134–144)

## 2018-03-25 LAB — CBC WITH DIFFERENTIAL/PLATELET
BASOS: 1 %
Basophils Absolute: 0.1 10*3/uL (ref 0.0–0.2)
EOS (ABSOLUTE): 0.2 10*3/uL (ref 0.0–0.4)
EOS: 3 %
HEMATOCRIT: 41.5 % (ref 34.0–46.6)
HEMOGLOBIN: 14.2 g/dL (ref 11.1–15.9)
IMMATURE GRANS (ABS): 0 10*3/uL (ref 0.0–0.1)
IMMATURE GRANULOCYTES: 0 %
Lymphocytes Absolute: 2.9 10*3/uL (ref 0.7–3.1)
Lymphs: 40 %
MCH: 33.3 pg — ABNORMAL HIGH (ref 26.6–33.0)
MCHC: 34.2 g/dL (ref 31.5–35.7)
MCV: 97 fL (ref 79–97)
Monocytes Absolute: 0.7 10*3/uL (ref 0.1–0.9)
Monocytes: 9 %
NEUTROS PCT: 47 %
Neutrophils Absolute: 3.4 10*3/uL (ref 1.4–7.0)
PLATELETS: 280 10*3/uL (ref 150–450)
RBC: 4.26 x10E6/uL (ref 3.77–5.28)
RDW: 11.4 % — ABNORMAL LOW (ref 12.3–15.4)
WBC: 7.3 10*3/uL (ref 3.4–10.8)

## 2018-03-25 LAB — LIPID PANEL W/O CHOL/HDL RATIO
Cholesterol, Total: 182 mg/dL (ref 100–199)
HDL: 53 mg/dL (ref >39)
LDL Calculated: 102 mg/dL — ABNORMAL HIGH (ref 0–99)
Triglycerides: 134 mg/dL (ref 0–149)
VLDL Cholesterol Cal: 27 mg/dL (ref 5–40)

## 2018-03-25 LAB — HGB A1C W/O EAG: Hgb A1c MFr Bld: 6.7 % — ABNORMAL HIGH (ref 4.8–5.6)

## 2018-03-25 LAB — FERRITIN: Ferritin: 155 ng/mL — ABNORMAL HIGH (ref 15–150)

## 2018-03-29 ENCOUNTER — Other Ambulatory Visit: Payer: Self-pay | Admitting: Adult Health

## 2018-03-29 ENCOUNTER — Other Ambulatory Visit: Payer: Self-pay | Admitting: Physician Assistant

## 2018-04-14 ENCOUNTER — Other Ambulatory Visit: Payer: Self-pay | Admitting: Physician Assistant

## 2018-05-11 ENCOUNTER — Other Ambulatory Visit: Payer: Self-pay | Admitting: Physician Assistant

## 2018-05-11 DIAGNOSIS — J0100 Acute maxillary sinusitis, unspecified: Secondary | ICD-10-CM

## 2018-06-12 ENCOUNTER — Other Ambulatory Visit: Payer: Self-pay | Admitting: Internal Medicine

## 2018-06-27 ENCOUNTER — Inpatient Hospital Stay: Payer: 59 | Attending: Hematology | Admitting: Hematology

## 2018-07-02 ENCOUNTER — Encounter: Payer: Self-pay | Admitting: Physician Assistant

## 2018-07-10 ENCOUNTER — Other Ambulatory Visit: Payer: Self-pay | Admitting: Physician Assistant

## 2018-07-10 DIAGNOSIS — E6609 Other obesity due to excess calories: Secondary | ICD-10-CM

## 2018-07-10 DIAGNOSIS — Z6833 Body mass index (BMI) 33.0-33.9, adult: Principal | ICD-10-CM

## 2018-07-31 ENCOUNTER — Encounter: Payer: Self-pay | Admitting: Physician Assistant

## 2018-10-13 ENCOUNTER — Other Ambulatory Visit: Payer: Self-pay | Admitting: Internal Medicine

## 2018-10-14 ENCOUNTER — Encounter: Payer: Self-pay | Admitting: Physician Assistant

## 2018-10-15 MED ORDER — VALACYCLOVIR HCL 1 G PO TABS: 1000.0000 mg | ORAL_TABLET | Freq: Two times a day (BID) | ORAL | 0 refills | Status: DC

## 2018-10-15 MED ORDER — PREDNISONE 20 MG PO TABS: ORAL_TABLET | ORAL | 0 refills | Status: DC

## 2018-11-15 ENCOUNTER — Other Ambulatory Visit: Payer: Self-pay | Admitting: Physician Assistant

## 2018-12-15 ENCOUNTER — Other Ambulatory Visit: Payer: Self-pay | Admitting: Adult Health

## 2018-12-18 ENCOUNTER — Other Ambulatory Visit: Payer: Self-pay | Admitting: Physician Assistant

## 2018-12-18 DIAGNOSIS — E6609 Other obesity due to excess calories: Secondary | ICD-10-CM

## 2018-12-22 ENCOUNTER — Other Ambulatory Visit: Payer: Self-pay | Admitting: Physician Assistant

## 2019-01-01 ENCOUNTER — Other Ambulatory Visit: Payer: Self-pay | Admitting: Physician Assistant

## 2019-01-01 MED ORDER — SULFAMETHOXAZOLE-TRIMETHOPRIM 800-160 MG PO TABS: 1.0000 | ORAL_TABLET | Freq: Two times a day (BID) | ORAL | 0 refills | Status: DC

## 2019-01-19 NOTE — Progress Notes (Deleted)
Complete Physical  Assessment and Plan:  Controlled type 2 diabetes mellitus without complication, without long-term current use of insulin ([place]) Get back on metformin Discussed general issues about diabetes pathophysiology and management., Educational material distributed., Suggested low cholesterol diet., Encouraged aerobic exercise., Discussed foot care., Reminded to get yearly retinal exam.  Hypothyroidism, unspecified type Hypothyroidism-check TSH level, continue medications the same, reminded to take on an empty stomach 30-60mins before food.   Seizure disorder [facility]) Continue medications  Dissociative identity disorder [facility]) Continue meds and follow up psych.   Recurrent major depressive disorder, in partial remission ([place]) Continue meds and follow up psych.   Panic attacks Continue meds and follow up psych.   Hyperlipidemia LDL goal <100 - check lipids, decrease fatty foods, increase activity.   Medication management [facility]S [facility]  Iron deficiency anemia, unspecified iron deficiency anemia type Check iron/ferritin, s/p transfusion, following Dr. Irene Limbo  Anxiety Continue meds and follow up psych.   Obesity surgery status Monitor labs, continue weight loss  Vitamin D deficiency Continue supplement  PTSD (post-traumatic stress disorder) Continue meds and follow up psych.   Need for diphtheria-tetanus-pertussis (Tdap) vaccine -     Tdap vaccine greater than or equal to 7yo IM  Class 1 obesity due to excess calories with serious comorbidity and body mass index (BMI) of 33.0 to 33.9 in adult -     phentermine (ADIPEX-P) 37.5 MG tablet; TAKE ONE TABLET ( 37.5 MG TOTAL) BY MOUTH DAILY BEFORE BREAKFAST -     Naltrexone-Bupropion HCl ER (CONTRAVE) 8-90 MG TB12; 1 pill a morning for 1 week, then 1 pill twice a day for 1 week, 2 pills in the morning and one at night for 1 week, then 2 pill twice a day for 1 week and stay on this dose.   Discussed med's effects and  SE's. Screening labs and tests as requested with regular follow-up as recommended. [facility] DONE AT [facility].   HPI  43 y.o. female  presents for a complete physical and follow up for chol, DM, seizures, anemia.    Her blood pressure has been controlled at home, today their BP is   She does not workout due to the weather.  She denies chest pain, shortness of breath, dizziness.   She is not on cholesterol medication and denies myalgias. Her cholesterol is at goal. The cholesterol last visit was:   Lab Results  Component Value Date   CHOL 182 03/21/2018   HDL 53 03/21/2018   LDLCALC 102 (H) 03/21/2018   TRIG 134 03/21/2018    She has been working on diet and exercise for diabetes, she has been off metformin and wants to get back on it, and denies paresthesia of the feet, polydipsia, polyuria and visual disturbances. Last A1C in the office was:  Lab Results  Component Value Date   HGBA1C 6.7 (H) 03/21/2018   Patient is on Vitamin D supplement.   Lab Results  Component Value Date   VD25OH 35.5 03/21/2018     She is on thyroid medication. Her medication was not changed last visit.   Lab Results  Component Value Date   TSH 0.088 (L) 03/21/2018  .   She is on trazodone for sleep, takes PRN, uses once a month.  She is on the valium PRN as well for anxiety.  She is on the [place] ring and is on it continuously. She has a history of miscarriages and infertility.  Due to her gastric bypass she has  has severe symptomatic iron def anemia, she has had an iron transfusion and is feeling better.  Lab Results  Component Value Date   IRON 128 03/21/2018   TIBC 393 03/21/2018   FERRITIN 155 (H) 03/21/2018   She has had epilepsy since she was 60, on valproic acid 250mg , has not had a seizure in a decade, had a trial off but had seizures. Should remain on medication. Follows with Dr. Tommy Medal.  She is 6 years s/p gastric bypass, she was 260 before the surgery and got down to 167.  She would like to  get back to 180.  BMI is There is no height or weight on file to calculate BMI., she is working on diet and exercise. She is on contrave daily, and will take phentermine AS needed.  Wt Readings from Last 10 Encounters:  03/14/18 216 lb (98 kg)  02/19/18 220 lb (99.8 kg)  12/25/17 213 lb (96.6 kg)  10/21/17 222 lb 9.6 oz (101 kg)  07/16/17 220 lb (99.8 kg)  06/27/17 216 lb 4.8 oz (98.1 kg)  05/08/17 224 lb 3.2 oz (101.7 kg)  05/01/17 225 lb (102.1 kg)  10/17/16 220 lb 6.4 oz (100 kg)  04/30/16 222 lb (100.7 kg)    Current Outpatient Medications on File Prior to Visit  Medication Sig  . cyclobenzaprine (FLEXERIL) 10 MG tablet Take 1 tablet at hour of Sleep  . diazepam (VALIUM) 5 MG tablet 1/2 to 1 daily as needed for severe anxiety  . etonogestrel-ethinyl estradiol (NUVARING) 0.12-0.015 MG/24HR vaginal ring Insert one per vagina every 21 days for a total of 4 consecutive readings. Take a three-day break and then start with one ring every 21 days for a total of 4 readings again Please note this requires further rings every 3 months  . fluconazole (DIFLUCAN) 150 MG tablet Take 1 tablet (150 mg total) by mouth daily.  . fluticasone (FLONASE) 50 MCG/ACT nasal spray PLACE 2 SPRAYS INTO BOTH NOSTRILS AT BEDTIME.  Marland Kitchen levothyroxine (SYNTHROID) 300 MCG tablet Take 1 tablet daily on an empty stomach with only water for 30 minutes & no Antacid meds, Calcium or Magnesium for 4 hours & avoid Biotin  . metFORMIN (GLUCOPHAGE-XR) 500 MG 24 hr tablet TAKE 1 TO 2 TABLETS BY  MOUTH DAILY WITH LARGEST  MEAL. START ON 1 TABLET  ONCE A DAY, AND CAN GO UP  TO 1 TABLET TWICE A DAY.  Marland Kitchen omeprazole (PRILOSEC) 20 MG capsule   . phentermine (ADIPEX-P) 37.5 MG tablet TAKE HALF TO ONE TABLET BY MOUTH EVERY MORNING FOR DIETING AND WEIGHT LOSS  . predniSONE (DELTASONE) 20 MG tablet 2 tablets daily for 3 days, 1 tablet daily for 4 days.  . promethazine-dextromethorphan (PROMETHAZINE-DM) 6.25-15 MG/5ML syrup Take 5 mLs by  mouth 4 (four) times daily as needed for cough.  . sulfamethoxazole-trimethoprim (BACTRIM DS) 800-160 MG tablet Take 1 tablet by mouth 2 (two) times daily.  . SUMAtriptan (IMITREX) 100 MG tablet Take 1 tablet (100 mg total) by mouth once as needed for migraine. May repeat in 2 hours if headache persists or recurs.  . traZODone (DESYREL) 50 MG tablet TAKE 1 TABLET BY MOUTH AT  BEDTIME  . valACYclovir (VALTREX) 1000 MG tablet Take 1 tablet (1,000 mg total) by mouth 2 (two) times daily. For 10 days.  Start within 72 hours of symptom onset.  . valproic acid (DEPAKENE) 250 MG capsule TAKE 2 CAPSULES BY MOUTH 3  TIMES DAILY   No current facility-administered medications  on file prior to visit.     Health Maintenance:   TDAP: TODAY Pneumovax: DUE Flu vaccine: 2018 Zostavax:  LMP: on nuva ring constantly, last year with OB/GYN Pap: July 2018 see GYN MGM: July 2018 at GYN Dr. Leo Grosser DEXA: Colonoscopy: EGD:  Patient Care Team: Unk Pinto, MD as PCP - General (Internal Medicine)  Medical History:  Past Medical History:  Diagnosis Date  . Abdominal cramping 08/10/2011  . Anxiety   . Asthma   . Depression   . Habitual abortion history, antepartum   . Headache(784.0)   . History of diabetes mellitus    prior to gastric bypass in 2012  . History of recurrent miscarriages, not currently pregnant 10/11/2011  . Hypothyroidism   . Infertility associated with anovulation   . Obesity    BMi 33  . Seizures ([place])   . UTI (lower urinary tract infection) 08/14/2011   Allergies Allergies  Allergen Reactions  . Codeine Itching, Rash and Other (See Comments)    OPIOIDS - MORPHINE & RELATED    SURGICAL HISTORY She  has a past surgical history that includes Gastric bypass (2012) and Dilation and curettage of uterus. FAMILY HISTORY Her family history includes Alcohol abuse in her maternal uncle; Alzheimer's disease in her maternal grandfather, maternal grandmother, and paternal  grandmother; Anorexia nervosa in her sister; Anxiety disorder in her maternal uncle; Depression in her maternal grandmother, maternal uncle, and mother; Diabetes in her maternal grandmother and mother. SOCIAL HISTORY She  reports that she quit smoking about 19 years ago. Her smoking use included cigarettes. She has never used smokeless tobacco. She reports current alcohol use of about 2.0 standard drinks of alcohol per week. She reports that she does not use drugs.  Review of Systems: Review of Systems  Constitutional: Positive for malaise/fatigue. Negative for chills, diaphoresis, fever and weight loss.  HENT: Negative.   Respiratory: Negative.   Cardiovascular: Negative.   Gastrointestinal: Negative for abdominal pain, blood in stool, constipation, diarrhea, heartburn, melena, nausea and vomiting.  Genitourinary: Negative.   Musculoskeletal: Negative.   Skin: Negative.   Neurological: Negative.  Negative for weakness.  Psychiatric/Behavioral: Negative for depression, hallucinations, memory loss, substance abuse and suicidal ideas. The patient is not nervous/anxious and does not have insomnia.     Physical Exam: Estimated body mass index is 32.84 kg/m as calculated from the following:   Height as of 03/14/18: 5\' 8"  (1.727 m).   Weight as of 03/14/18: 216 lb (98 kg). There were no vitals taken for this visit. General Appearance: Well nourished, in no apparent distress.  Eyes: PERRLA, EOMs, conjunctiva no swelling or erythema, normal fundi and vessels.  Sinuses: No Frontal/maxillary tenderness  ENT/Mouth: Ext aud canals clear, normal light reflex with TMs without erythema, bulging. Good dentition. No erythema, swelling, or exudate on post pharynx. Tonsils not swollen or erythematous. Hearing normal. Crowded mouth.  Neck: Supple, thyroid normal. No bruits  Respiratory: Respiratory effort normal, BS equal bilaterally without rales, rhonchi, wheezing or stridor.  Cardio: RRR without murmurs,  rubs or gallops. Prominent S2. Brisk peripheral pulses without edema.  Chest: symmetric, with normal excursions and percussion.  Breasts: defer Abdomen: Soft, nontender, no guarding, rebound, hernias, masses, or organomegaly. .  Lymphatics: Non tender without lymphadenopathy.  Genitourinary: defer Musculoskeletal: Full ROM all peripheral extremities,5/5 strength, and normal gait.  Skin: Warm, dry without rashes, lesions, ecchymosis. Neuro: Cranial nerves intact, reflexes equal bilaterally. Normal muscle tone, no cerebellar symptoms. Sensation intact.  Psych: Awake and oriented X  3, normal affect, Insight and Judgment appropriate.    EKG- declines at this time.   Vicie Mutters 8:19 AM [facility]

## 2019-01-20 ENCOUNTER — Encounter: Payer: 59 | Admitting: Physician Assistant

## 2019-02-10 NOTE — Progress Notes (Signed)
Complete Physical  Assessment and Plan:  Controlled type 2 diabetes mellitus without complication, without long-term current use of insulin ([place]) Will try ozempic for weight loss Discussed general issues about diabetes pathophysiology and management., Educational material distributed., Suggested low cholesterol diet., Encouraged aerobic exercise., Discussed foot care., Reminded to get yearly retinal exam.  Hypothyroidism, unspecified type Hypothyroidism-check TSH level, continue medications the same, reminded to take on an empty stomach 30-36mins before food.   Seizure disorder [facility]) Continue medications  Dissociative identity disorder [facility]) Continue meds and follow up psych.   Recurrent major depressive disorder, in partial remission ([place]) Continue meds and follow up psych.   Panic attacks Continue meds and follow up psych.   Hyperlipidemia LDL goal <100 - check lipids, decrease fatty foods, increase activity.   Medication management [facility]S [facility]  Iron deficiency anemia, unspecified iron deficiency anemia type Check iron/ferritin, s/p transfusion, following Dr. Irene Limbo  Anxiety Continue meds and follow up psych.   Obesity surgery status Monitor labs, continue weight loss  Vitamin D deficiency Continue supplement  PTSD (post-traumatic stress disorder) Continue meds and follow up psych.   Class 1 obesity due to excess calories with serious comorbidity and body mass index (BMI) of 33.0 to 33.9 in adult -     phentermine (ADIPEX-P) 37.5 MG tablet; TAKE ONE TABLET ( 37.5 MG TOTAL) BY MOUTH DAILY BEFORE BREAKFAST    Discussed med's effects and SE's. Screening labs and tests as requested with regular follow-up as recommended. LAB DONE [facility].   HPI  43 y.o. female  presents for a complete physical and follow up for chol, DM, seizures, anemia.    She has a lot of stress right now, moving from her house and going to be buying a town house so she admits to  drinking more and not working out much.  BMI is Body mass index is 32.84 kg/m., she is working on diet and exercise. She is 6 years s/p gastric bypass, she was 260 before the surgery and got down to 167.  She would like to get back to 180.  Wt Readings from Last 3 Encounters:  02/12/19 216 lb (98 kg)  03/14/18 216 lb (98 kg)  02/19/18 220 lb (99.8 kg)   She has a rash on her face and bilateral side of her thighs, [place] the [place] this [place] and thighs for last 36 hours. States it is burning.   Her blood pressure has been controlled at home, today their BP is BP: 124/86 She does not workout due to the weather.  She denies chest pain, shortness of breath, dizziness.   She is not on cholesterol medication and denies myalgias. Her cholesterol is at goal. The cholesterol last visit was:   Lab Results  Component Value Date   CHOL 182 03/21/2018   HDL 53 03/21/2018   LDLCALC 102 (H) 03/21/2018   TRIG 134 03/21/2018    She has been working on diet and exercise for diabetes, she has been off metformin due to severe diarrhea and unable to tolerate it. Denies paresthesia of the feet, polydipsia, polyuria and visual disturbances. Last A1C in the office was:  Lab Results  Component Value Date   HGBA1C 6.7 (H) 03/21/2018   Patient is on Vitamin D supplement.   Lab Results  Component Value Date   VD25OH 35.5 03/21/2018     She is on thyroid medication. Her medication was not changed last visit, has been taking with food  since working from home.   Lab Results  Component Value Date   TSH 0.088 (L) 03/21/2018  .   She is on trazodone for sleep, takes PRN, uses once a month.  She is on the valium PRN as well for anxiety.  She is on the [place] ring and is on it continuously. She has a history of miscarriages and infertility.  Due to her gastric bypass she has has severe symptomatic iron def anemia, she has had an iron transfusion and is feeling better. She is on her vitamins.  Lab  Results  Component Value Date   IRON 128 03/21/2018   TIBC 393 03/21/2018   FERRITIN 155 (H) 03/21/2018   She has had epilepsy since she was 44, on valproic acid 250mg , has not had a seizure in a decade, had a trial off but had seizures. Should remain on medication. Follows with Dr. Tommy Medal.    Current Outpatient Medications on File Prior to Visit  Medication Sig  . cyclobenzaprine (FLEXERIL) 10 MG tablet Take 1 tablet at hour of Sleep  . diazepam (VALIUM) 5 MG tablet 1/2 to 1 daily as needed for severe anxiety  . etonogestrel-ethinyl estradiol (NUVARING) 0.12-0.015 MG/24HR vaginal ring Insert one per vagina every 21 days for a total of 4 consecutive readings. Take a three-day break and then start with one ring every 21 days for a total of 4 readings again Please note this requires further rings every 3 months  . fluconazole (DIFLUCAN) 150 MG tablet Take 1 tablet (150 mg total) by mouth daily.  . fluticasone (FLONASE) 50 MCG/ACT nasal spray PLACE 2 SPRAYS INTO BOTH NOSTRILS AT BEDTIME.  Marland Kitchen levothyroxine (SYNTHROID) 300 MCG tablet Take 1 tablet daily on an empty stomach with only water for 30 minutes & no Antacid meds, Calcium or Magnesium for 4 hours & avoid Biotin  . metFORMIN (GLUCOPHAGE-XR) 500 MG 24 hr tablet TAKE 1 TO 2 TABLETS BY  MOUTH DAILY WITH LARGEST  MEAL. START ON 1 TABLET  ONCE A DAY, AND CAN GO UP  TO 1 TABLET TWICE A DAY.  Marland Kitchen omeprazole (PRILOSEC) 20 MG capsule   . phentermine (ADIPEX-P) 37.5 MG tablet TAKE HALF TO ONE TABLET BY MOUTH EVERY MORNING FOR DIETING AND WEIGHT LOSS  . predniSONE (DELTASONE) 20 MG tablet 2 tablets daily for 3 days, 1 tablet daily for 4 days.  . promethazine-dextromethorphan (PROMETHAZINE-DM) 6.25-15 MG/5ML syrup Take 5 mLs by mouth 4 (four) times daily as needed for cough.  . sulfamethoxazole-trimethoprim (BACTRIM DS) 800-160 MG tablet Take 1 tablet by mouth 2 (two) times daily.  . SUMAtriptan (IMITREX) 100 MG tablet Take 1 tablet (100 mg total)  by mouth once as needed for migraine. May repeat in 2 hours if headache persists or recurs.  . traZODone (DESYREL) 50 MG tablet TAKE 1 TABLET BY MOUTH AT  BEDTIME  . valACYclovir (VALTREX) 1000 MG tablet Take 1 tablet (1,000 mg total) by mouth 2 (two) times daily. For 10 days.  Start within 72 hours of symptom onset.  . valproic acid (DEPAKENE) 250 MG capsule TAKE 2 CAPSULES BY MOUTH 3  TIMES DAILY   No current facility-administered medications on file prior to visit.     Health Maintenance:   Immunization History  Administered Date(s) Administered  . Influenza, Seasonal, Injecte, Preservative Fre 04/30/2016  . Tdap 07/16/2017    TDAP: 2019 Pneumovax: DUE Flu vaccine: 2019 Zostavax:  LMP: on nuva ring constantly, last year with OB/GYN Pap: July 2019 see GYN  MGM: July 2019 at GYN Dr. Marica Otter- has OV tomorrow DEXA: Colonoscopy: EGD: Sleep study?  Patient Care Team: Unk Pinto, MD as PCP - General (Internal Medicine)  Medical History:  Past Medical History:  Diagnosis Date  . Abdominal cramping 08/10/2011  . Anxiety   . Asthma   . Depression   . Habitual abortion history, antepartum   . Headache(784.0)   . History of diabetes mellitus    prior to gastric bypass in 2012  . History of recurrent miscarriages, not currently pregnant 10/11/2011  . Hypothyroidism   . Infertility associated with anovulation   . Obesity    BMi 33  . Seizures ([place])   . UTI (lower urinary tract infection) 08/14/2011   Allergies Allergies  Allergen Reactions  . Codeine Itching, Rash and Other (See Comments)    OPIOIDS - MORPHINE & RELATED    SURGICAL HISTORY She  has a past surgical history that includes Gastric bypass (2012) and Dilation and curettage of uterus. FAMILY HISTORY Her family history includes Alcohol abuse in her maternal uncle; Alzheimer's disease in her maternal grandfather, maternal grandmother, and paternal grandmother; Anorexia nervosa in her sister; Anxiety disorder in  her maternal uncle; Depression in her maternal grandmother, maternal uncle, and mother; Diabetes in her maternal grandmother and mother. SOCIAL HISTORY She  reports that she quit smoking about 19 years ago. Her smoking use included cigarettes. She has never used smokeless tobacco. She reports current alcohol use of about 2.0 standard drinks of alcohol per week. She reports that she does not use drugs.  Review of Systems: Review of Systems  Constitutional: Positive for malaise/fatigue. Negative for chills, diaphoresis, fever and weight loss.  HENT: Negative.   Respiratory: Negative.   Cardiovascular: Negative.   Gastrointestinal: Negative for abdominal pain, blood in stool, constipation, diarrhea, heartburn, melena, nausea and vomiting.  Genitourinary: Negative.   Musculoskeletal: Negative.   Skin: Positive for rash. Negative for itching.  Neurological: Negative.  Negative for weakness.  Psychiatric/Behavioral: Negative for depression, hallucinations, memory loss, substance abuse and suicidal ideas. The patient is not nervous/anxious and does not have insomnia.     Physical Exam: Estimated body mass index is 32.84 kg/m as calculated from the following:   Height as of this encounter: 5\' 8"  (1.727 m).   Weight as of this encounter: 216 lb (98 kg). BP 124/86   Pulse 68   Temp (!) 97.3 F (36.3 C)   Resp 14   Ht 5\' 8"  (1.727 m)   Wt 216 lb (98 kg)   BMI 32.84 kg/m  General Appearance: Well nourished, in no apparent distress.  Eyes: PERRLA, EOMs, conjunctiva no swelling or erythema, normal fundi and vessels.  Sinuses: No Frontal/maxillary tenderness  ENT/Mouth: Ext aud canals clear, normal light reflex with TMs without erythema, bulging. Good dentition. No erythema, swelling, or exudate on post pharynx. Tonsils not swollen or erythematous. Hearing normal. Crowded mouth.  Neck: Supple, thyroid normal. No bruits  Respiratory: Respiratory effort normal, BS equal bilaterally without rales,  rhonchi, wheezing or stridor.  Cardio: RRR without murmurs, rubs or gallops. Prominent S2. Brisk peripheral pulses without edema.  Chest: symmetric, with normal excursions and percussion.  Breasts: defer Abdomen: Soft, nontender, no guarding, rebound, hernias, masses, or organomegaly. .  Lymphatics: Non tender without lymphadenopathy.  Genitourinary: defer Musculoskeletal: Full ROM all peripheral extremities,5/5 strength, and normal gait.  Skin: Warm, dry without rashes, lesions, ecchymosis. Neuro: Cranial nerves intact, reflexes equal bilaterally. Normal muscle tone, no cerebellar symptoms. Sensation intact.  Psych: Awake and oriented X 3, normal affect, Insight and Judgment appropriate.    EKG- declines at this time.   Vicie Mutters 10:11 AM [facility]

## 2019-02-12 ENCOUNTER — Ambulatory Visit: Payer: 59 | Admitting: Physician Assistant

## 2019-02-12 ENCOUNTER — Other Ambulatory Visit: Payer: Self-pay

## 2019-02-12 VITALS — BP 124/86 | HR 68 | Temp 97.3°F | Resp 14 | Ht 68.0 in | Wt 216.0 lb

## 2019-02-12 DIAGNOSIS — Z23 Encounter for immunization: Secondary | ICD-10-CM

## 2019-02-12 DIAGNOSIS — F419 Anxiety disorder, unspecified: Secondary | ICD-10-CM

## 2019-02-12 DIAGNOSIS — G40909 Epilepsy, unspecified, not intractable, without status epilepticus: Secondary | ICD-10-CM

## 2019-02-12 DIAGNOSIS — F3341 Major depressive disorder, recurrent, in partial remission: Secondary | ICD-10-CM

## 2019-02-12 DIAGNOSIS — E785 Hyperlipidemia, unspecified: Secondary | ICD-10-CM

## 2019-02-12 DIAGNOSIS — E039 Hypothyroidism, unspecified: Secondary | ICD-10-CM

## 2019-02-12 DIAGNOSIS — Z0001 Encounter for general adult medical examination with abnormal findings: Secondary | ICD-10-CM

## 2019-02-12 DIAGNOSIS — D509 Iron deficiency anemia, unspecified: Secondary | ICD-10-CM

## 2019-02-12 DIAGNOSIS — F4481 Dissociative identity disorder: Secondary | ICD-10-CM

## 2019-02-12 DIAGNOSIS — R21 Rash and other nonspecific skin eruption: Secondary | ICD-10-CM

## 2019-02-12 DIAGNOSIS — Z9884 Bariatric surgery status: Secondary | ICD-10-CM

## 2019-02-12 DIAGNOSIS — E559 Vitamin D deficiency, unspecified: Secondary | ICD-10-CM

## 2019-02-12 DIAGNOSIS — Z0000 Encounter for general adult medical examination without abnormal findings: Secondary | ICD-10-CM | POA: Diagnosis not present

## 2019-02-12 DIAGNOSIS — F4310 Post-traumatic stress disorder, unspecified: Secondary | ICD-10-CM

## 2019-02-12 DIAGNOSIS — E6609 Other obesity due to excess calories: Secondary | ICD-10-CM

## 2019-02-12 DIAGNOSIS — E119 Type 2 diabetes mellitus without complications: Secondary | ICD-10-CM

## 2019-02-12 DIAGNOSIS — E66811 Obesity, class 1: Secondary | ICD-10-CM

## 2019-02-12 DIAGNOSIS — Z79899 Other long term (current) drug therapy: Secondary | ICD-10-CM

## 2019-02-12 MED ORDER — OZEMPIC (0.25 OR 0.5 MG/DOSE) 2 MG/1.5ML ~~LOC~~ SOPN: 0.5000 mg | PEN_INJECTOR | SUBCUTANEOUS | 3 refills | Status: DC

## 2019-02-12 MED ORDER — PHENTERMINE HCL 37.5 MG PO TABS: ORAL_TABLET | ORAL | 1 refills | Status: DC

## 2019-02-12 MED ORDER — EUCRISA 2 % EX OINT: 1.0000 "application " | TOPICAL_OINTMENT | Freq: Two times a day (BID) | CUTANEOUS | 0 refills | Status: DC

## 2019-02-12 NOTE — Patient Instructions (Addendum)
Likely eczema on your face Use the eurcirsa twice a day  Ozempic is a once a week shot Start with 0.25mg  for 1-2 weeks Then move to 0.5mg  once a week for a month This can cause nausea that should go away,please eat small frequent meals   Semaglutide injection solution What is this medicine? SEMAGLUTIDE (Sem a GLOO tide) is used to improve blood sugar control in adults with type 2 diabetes. This medicine may be used with other diabetes medicines. This drug may also reduce the risk of heart attack or stroke if you have type 2 diabetes and risk factors for heart disease. This medicine may be used for other purposes; ask your health care provider or pharmacist if you have questions. COMMON BRAND NAME(S): OZEMPIC What should I tell my health care provider before I take this medicine? They need to know if you have any of these conditions:  endocrine tumors (MEN 2) or if someone in your family had these tumors  eye disease, vision problems  history of pancreatitis  kidney disease  stomach problems  thyroid cancer or if someone in your family had thyroid cancer  an unusual or allergic reaction to semaglutide, other medicines, foods, dyes, or preservatives  pregnant or trying to get pregnant  breast-feeding How should I use this medicine? This medicine is for injection under the skin of your upper leg (thigh), stomach area, or upper arm. It is given once every week (every 7 days). You will be taught how to prepare and give this medicine. Use exactly as directed. Take your medicine at regular intervals. Do not take it more often than directed. If you use this medicine with insulin, you should inject this medicine and the insulin separately. Do not mix them together. Do not give the injections right next to each other. Change (rotate) injection sites with each injection. It is important that you put your used needles and syringes in a special sharps container. Do not put them in a trash  can. If you do not have a sharps container, call your pharmacist or healthcare provider to get one. A special MedGuide will be given to you by the pharmacist with each prescription and refill. Be sure to read this information carefully each time. Talk to your pediatrician regarding the use of this medicine in children. Special care may be needed. Overdosage: If you think you have taken too much of this medicine contact a poison control center or emergency room at once. NOTE: This medicine is only for you. Do not share this medicine with others. What if I miss a dose? If you miss a dose, take it as soon as you can within 5 days after the missed dose. Then take your next dose at your regular weekly time. If it has been longer than 5 days after the missed dose, do not take the missed dose. Take the next dose at your regular time. Do not take double or extra doses. If you have questions about a missed dose, contact your health care provider for advice. What may interact with this medicine?  other medicines for diabetes Many medications may cause changes in blood sugar, these include:  alcohol containing beverages  antiviral medicines for HIV or AIDS  aspirin and aspirin-like drugs  certain medicines for blood pressure, heart disease, irregular heart beat  chromium  diuretics  female hormones, such as estrogens or progestins, birth control pills  fenofibrate  gemfibrozil  isoniazid  lanreotide  female hormones or anabolic steroids  MAOIs  like Carbex, Eldepryl, Marplan, Nardil, and Parnate  medicines for weight loss  medicines for allergies, asthma, cold, or cough  medicines for depression, anxiety, or psychotic disturbances  niacin  nicotine  NSAIDs, medicines for pain and inflammation, like ibuprofen or naproxen  octreotide  pasireotide  pentamidine  phenytoin  probenecid  quinolone antibiotics such as ciprofloxacin, levofloxacin, ofloxacin  some herbal dietary  supplements  steroid medicines such as prednisone or cortisone  sulfamethoxazole; trimethoprim  thyroid hormones Some medications can hide the warning symptoms of low blood sugar (hypoglycemia). You may need to monitor your blood sugar more closely if you are taking one of these medications. These include:  beta-blockers, often used for high blood pressure or heart problems (examples include atenolol, metoprolol, propranolol)  clonidine  guanethidine  reserpine This list may not describe all possible interactions. Give your health care provider a list of all the medicines, herbs, non-prescription drugs, or dietary supplements you use. Also tell them if you smoke, drink alcohol, or use illegal drugs. Some items may interact with your medicine. What should I watch for while using this medicine? Visit your doctor or health care professional for regular checks on your progress. Drink plenty of fluids while taking this medicine. Check with your doctor or health care professional if you get an attack of severe diarrhea, nausea, and vomiting. The loss of too much body fluid can make it dangerous for you to take this medicine. A test called the HbA1C (A1C) will be monitored. This is a simple blood test. It measures your blood sugar control over the last 2 to 3 months. You will receive this test every 3 to 6 months. Learn how to check your blood sugar. Learn the symptoms of low and high blood sugar and how to manage them. Always carry a quick-source of sugar with you in case you have symptoms of low blood sugar. Examples include hard sugar candy or glucose tablets. Make sure others know that you can choke if you eat or drink when you develop serious symptoms of low blood sugar, such as seizures or unconsciousness. They must get medical help at once. Tell your doctor or health care professional if you have high blood sugar. You might need to change the dose of your medicine. If you are sick or  exercising more than usual, you might need to change the dose of your medicine. Do not skip meals. Ask your doctor or health care professional if you should avoid alcohol. Many nonprescription cough and cold products contain sugar or alcohol. These can affect blood sugar. Pens should never be shared. Even if the needle is changed, sharing may result in passing of viruses like hepatitis or HIV. Wear a medical ID bracelet or chain, and carry a card that describes your disease and details of your medicine and dosage times. Do not become pregnant while taking this medicine. Women should inform their doctor if they wish to become pregnant or think they might be pregnant. There is a potential for serious side effects to an unborn child. Talk to your health care professional or pharmacist for more information. What side effects may I notice from receiving this medicine? Side effects that you should report to your doctor or health care professional as soon as possible:  allergic reactions like skin rash, itching or hives, swelling of the face, lips, or tongue  breathing problems  changes in vision  diarrhea that continues or is severe  lump or swelling on the neck  severe nausea  signs and symptoms of infection like fever or chills; cough; sore throat; pain or trouble passing urine  signs and symptoms of low blood sugar such as feeling anxious, confusion, dizziness, increased hunger, unusually weak or tired, sweating, shakiness, cold, irritable, headache, blurred vision, fast heartbeat, loss of consciousness  signs and symptoms of kidney injury like trouble passing urine or change in the amount of urine  trouble swallowing  unusual stomach upset or pain  vomiting Side effects that usually do not require medical attention (report to your doctor or health care professional if they continue or are bothersome):  constipation  diarrhea  nausea  pain, redness, or irritation at site where  injected  stomach upset This list may not describe all possible side effects. Call your doctor for medical advice about side effects. You may report side effects to FDA at 1-800-FDA-1088. Where should I keep my medicine? Keep out of the reach of children. Store unopened pens in a refrigerator between 2 and 8 degrees C (36 and 46 degrees F). Do not freeze. Protect from light and heat. After you first use the pen, it can be stored for 56 days at room temperature between 15 and 30 degrees C (59 and 86 degrees F) or in a refrigerator. Throw away your used pen after 56 days or after the expiration date, whichever comes first. Do not store your pen with the needle attached. If the needle is left on, medicine may leak from the pen. NOTE: This sheet is a summary. It may not cover all possible information. If you have questions about this medicine, talk to your doctor, pharmacist, or health care provider.  2020 Elsevier/Gold Standard (2018-07-11 14:25:32)    Shoulder Impingement Syndrome  Shoulder impingement syndrome is a condition that causes pain when connective tissues (tendons) surrounding the shoulder joint become pinched. These tendons are part of the group of muscles and tissues that help to stabilize the shoulder (rotator cuff). Beneath the rotator cuff is a fluid-filled sac (bursa) that allows the muscles and tendons to glide smoothly. The bursa may become swollen or irritated (bursitis). Bursitis, swelling in the rotator cuff tendons, or both conditions can decrease how much space is under a bone in the shoulder joint (acromion), resulting in impingement. What are the causes? Shoulder impingement syndrome may be caused by bursitis or swelling of the rotator cuff tendons, which may result from:  Repetitive overhead arm movements.  Falling onto the shoulder.  Weakness in the shoulder muscles. What increases the risk? You may be more likely to develop this condition if you:  Play sports  that involve throwing, such as baseball.  Participate in sports such as tennis, volleyball, and swimming.  Work as a Curator, Games developer, or Architect. Some people are also more likely to develop impingement syndrome because of the shape of their acromion bone. What are the signs or symptoms? The main symptom of this condition is pain on the front or side of the shoulder. The pain may:  Get worse when lifting or raising the arm.  Get worse at night.  Wake you up from sleeping.  Feel sharp when the shoulder is moved and then fade to an ache. Other symptoms may include:  Tenderness.  Stiffness.  Inability to raise the arm above shoulder level or behind the body.  Weakness. How is this diagnosed? This condition may be diagnosed based on:  Your symptoms and medical history.  A physical exam.  Imaging tests, such as: ? X-rays. ? MRI. ?  Ultrasound. How is this treated? This condition may be treated by:  Resting your shoulder and avoiding all activities that cause pain or put stress on the shoulder.  Icing your shoulder.  NSAIDs to help reduce pain and swelling.  One or more injections of medicines to numb the area and reduce inflammation.  Physical therapy.  Surgery. This may be needed if nonsurgical treatments have not helped. Surgery may involve repairing the rotator cuff, reshaping the acromion, or removing the bursa. Follow these instructions at home: Managing pain, stiffness, and swelling   If directed, put ice on the injured area. ? Put ice in a plastic bag. ? Place a towel between your skin and the bag. ? Leave the ice on for 20 minutes, 2-3 times a day. Activity  Rest and return to your normal activities as told by your health care provider. Ask your health care provider what activities are safe for you.  Do exercises as told by your health care provider. General instructions  Do not use any products that contain nicotine or tobacco, such as  cigarettes, e-cigarettes, and chewing tobacco. These can delay healing. If you need help quitting, ask your health care provider.  Ask your health care provider when it is safe for you to drive.  Take over-the-counter and prescription medicines only as told by your health care provider.  Keep all follow-up visits as told by your health care provider. This is important. How is this prevented?  Give your body time to rest between periods of activity.  Be safe and responsible while being active. This will help you avoid falls.  Maintain physical fitness, including strength and flexibility. Contact a health care provider if:  Your symptoms have not improved after 1-2 months of treatment and rest.  You cannot lift your arm away from your body. Summary  Shoulder impingement syndrome is a condition that causes pain when connective tissues (tendons) surrounding the shoulder joint become pinched.  The main symptom of this condition is pain on the front or side of the shoulder.  This condition is usually treated with rest, ice, and pain medicines as needed. This information is not intended to replace advice given to you by your health care provider. Make sure you discuss any questions you have with your health care provider. Document Released: 06/11/2005 Document Revised: 10/03/2018 Document Reviewed: 12/04/2017 Elsevier Patient Education  2020 [employer].   Shoulder Impingement Syndrome Rehab Ask your health care provider which exercises are safe for you. Do exercises exactly as told by your health care provider and adjust them as directed. It is normal to feel mild stretching, pulling, tightness, or discomfort as you do these exercises. Stop right away if you feel sudden pain or your pain gets worse. Do not begin these exercises until told by your health care provider. Stretching and range-of-motion exercise This exercise warms up your muscles and joints and improves the movement and  flexibility of your shoulder. This exercise also helps to relieve pain and stiffness. Passive horizontal adduction In passive adduction, you use your other hand to move the injured arm toward your body. The injured arm does not move on its own. In this movement, your arm is moved across your body in the horizontal plane (horizontal adduction). 1. Sit or stand and pull your left / right elbow across your chest, toward your other shoulder. Stop when you feel a gentle stretch in the back of your shoulder and upper arm. ? Keep your arm at shoulder height. ?  Keep your arm as close to your body as you comfortably can. 2. Hold for __________ seconds. 3. Slowly return to the starting position. Repeat __________ times. Complete this exercise __________ times a day. Strengthening exercises These exercises build strength and endurance in your shoulder. Endurance is the ability to use your muscles for a long time, even after they get tired. External rotation, isometric This is an exercise in which you press the back of your wrist against a door frame without moving your shoulder joint (isometric). 1. Stand or sit in a doorway, facing the door frame. 2. Bend your left / right elbow and place the back of your wrist against the door frame. Only the back of your wrist should be touching the frame. Keep your upper arm at your side. 3. Gently press your wrist against the door frame, as if you are trying to push your arm away from your abdomen (external rotation). Press as hard as you are able without pain. ? Avoid shrugging your shoulder while you press your wrist against the door frame. Keep your shoulder blade tucked down toward the middle of your back. 4. Hold for __________ seconds. 5. Slowly release the tension, and relax your muscles completely before you repeat the exercise. Repeat __________ times. Complete this exercise __________ times a day. Internal rotation, isometric This is an exercise in which you  press your palm against a door frame without moving your shoulder joint (isometric). 1. Stand or sit in a doorway, facing the door frame. 2. Bend your left / right elbow and place the palm of your hand against the door frame. Only your palm should be touching the frame. Keep your upper arm at your side. 3. Gently press your hand against the door frame, as if you are trying to push your arm toward your abdomen (internal rotation). Press as hard as you are able without pain. ? Avoid shrugging your shoulder while you press your hand against the door frame. Keep your shoulder blade tucked down toward the middle of your back. 4. Hold for __________ seconds. 5. Slowly release the tension, and relax your muscles completely before you repeat the exercise. Repeat __________ times. Complete this exercise __________ times a day. Scapular protraction, supine  1. Lie on your back on a firm surface (supine position). Hold a __________ weight in your left / right hand. 2. Raise your left / right arm straight into the air so your hand is directly above your shoulder joint. 3. Push the weight into the air so your shoulder (scapula) lifts off the surface that you are lying on. The scapula will push up or forward (protraction). Do not move your head, neck, or back. 4. Hold for __________ seconds. 5. Slowly return to the starting position. Let your muscles relax completely before you repeat this exercise. Repeat __________ times. Complete this exercise __________ times a day. Scapular retraction  1. Sit in a stable chair without armrests, or stand up. 2. Secure an exercise band to a stable object in front of you so the band is at shoulder height. 3. Hold one end of the exercise band in each hand. Your palms should face down. 4. Squeeze your shoulder blades together (retraction) and move your elbows slightly behind you. Do not shrug your shoulders upward while you do this. 5. Hold for __________ seconds. 6. Slowly  return to the starting position. Repeat __________ times. Complete this exercise __________ times a day. Shoulder extension  1. Sit in a stable chair without  armrests, or stand up. 2. Secure an exercise band to a stable object in front of you so the band is above shoulder height. 3. Hold one end of the exercise band in each hand. 4. Straighten your elbows and lift your hands up to shoulder height. 5. Squeeze your shoulder blades together and pull your hands down to the sides of your thighs (extension). Stop when your hands are straight down by your sides. Do not let your hands go behind your body. 6. Hold for __________ seconds. 7. Slowly return to the starting position. Repeat __________ times. Complete this exercise __________ times a day. This information is not intended to replace advice given to you by your health care provider. Make sure you discuss any questions you have with your health care provider. Document Released: 06/11/2005 Document Revised: 10/03/2018 Document Reviewed: 07/07/2018 Elsevier Patient Education  2020 [employer].

## 2019-02-12 NOTE — Addendum Note (Signed)
Addended by: Elsie Amis D on: 02/12/2019 11:14 AM   Modules accepted: Orders

## 2019-02-23 ENCOUNTER — Other Ambulatory Visit: Payer: Self-pay | Admitting: Physician Assistant

## 2019-02-23 MED ORDER — PREDNISONE 20 MG PO TABS: ORAL_TABLET | ORAL | 0 refills | Status: DC

## 2019-04-06 ENCOUNTER — Other Ambulatory Visit: Payer: Self-pay | Admitting: Physician Assistant

## 2019-04-06 DIAGNOSIS — E6609 Other obesity due to excess calories: Secondary | ICD-10-CM

## 2019-04-06 DIAGNOSIS — Z6833 Body mass index (BMI) 33.0-33.9, adult: Secondary | ICD-10-CM

## 2019-04-06 MED ORDER — PHENTERMINE HCL 37.5 MG PO TABS: ORAL_TABLET | ORAL | 1 refills | Status: DC

## 2019-04-06 MED ORDER — OZEMPIC (1 MG/DOSE) 2 MG/1.5ML ~~LOC~~ SOPN: 1.0000 mg | PEN_INJECTOR | SUBCUTANEOUS | 0 refills | Status: DC

## 2019-04-06 NOTE — Progress Notes (Signed)
Future Appointments  Date Time Provider Equality  05/28/2019  3:45 PM Vicie Mutters, PA-C [facility] None  02/16/2020 10:00 AM Vicie Mutters, PA-C [facility] None

## 2019-04-14 ENCOUNTER — Other Ambulatory Visit: Payer: Self-pay | Admitting: Physician Assistant

## 2019-05-28 ENCOUNTER — Ambulatory Visit: Payer: 59 | Admitting: Physician Assistant

## 2019-05-28 ENCOUNTER — Encounter: Payer: Self-pay | Admitting: Physician Assistant

## 2019-05-28 ENCOUNTER — Other Ambulatory Visit: Payer: Self-pay

## 2019-05-28 VITALS — BP 120/90 | HR 88 | Temp 97.7°F | Ht 68.0 in | Wt 197.0 lb

## 2019-05-28 DIAGNOSIS — E039 Hypothyroidism, unspecified: Secondary | ICD-10-CM

## 2019-05-28 DIAGNOSIS — E1169 Type 2 diabetes mellitus with other specified complication: Secondary | ICD-10-CM

## 2019-05-28 DIAGNOSIS — Z9884 Bariatric surgery status: Secondary | ICD-10-CM

## 2019-05-28 DIAGNOSIS — R05 Cough: Secondary | ICD-10-CM

## 2019-05-28 DIAGNOSIS — E559 Vitamin D deficiency, unspecified: Secondary | ICD-10-CM

## 2019-05-28 DIAGNOSIS — F3341 Major depressive disorder, recurrent, in partial remission: Secondary | ICD-10-CM

## 2019-05-28 DIAGNOSIS — E66811 Obesity, class 1: Secondary | ICD-10-CM

## 2019-05-28 DIAGNOSIS — Z79899 Other long term (current) drug therapy: Secondary | ICD-10-CM

## 2019-05-28 DIAGNOSIS — E6609 Other obesity due to excess calories: Secondary | ICD-10-CM

## 2019-05-28 DIAGNOSIS — J410 Simple chronic bronchitis: Secondary | ICD-10-CM

## 2019-05-28 DIAGNOSIS — G40909 Epilepsy, unspecified, not intractable, without status epilepticus: Secondary | ICD-10-CM

## 2019-05-28 DIAGNOSIS — R059 Cough, unspecified: Secondary | ICD-10-CM

## 2019-05-28 DIAGNOSIS — E785 Hyperlipidemia, unspecified: Secondary | ICD-10-CM

## 2019-05-28 DIAGNOSIS — Z23 Encounter for immunization: Secondary | ICD-10-CM

## 2019-05-28 MED ORDER — PHENTERMINE HCL 37.5 MG PO TABS: ORAL_TABLET | ORAL | 2 refills | Status: DC

## 2019-05-28 MED ORDER — OZEMPIC (1 MG/DOSE) 2 MG/1.5ML ~~LOC~~ SOPN: 1.0000 mg | PEN_INJECTOR | SUBCUTANEOUS | 3 refills | Status: DC

## 2019-05-28 NOTE — Progress Notes (Signed)
FOLLOW UP  Assessment and Plan: Type 2 diabetes mellitus with hyperlipidemia (HCC) Continue medication, doing very well with ozempic 1 mg  Hypothyroidism, unspecified type Hypothyroidism-check TSH level, continue medications the same, reminded to take on an empty stomach 30-81mins before food.   Seizure disorder ([place]) Continue medications  Recurrent major depressive disorder, in partial remission ([place]) Continue to see psych  Medication management  Obesity surgery status Continue weight loss continue to increase exercise  Vitamin D deficiency Continue supplement  cough  -     DG Chest 2 View; Future    Discussed med's effects and SE's. Screening labs and tests as requested with regular follow-up as recommended. LAB DONE AT [facility], HAS RX FOR LABS AND WILL GET DONE Future Appointments  Date Time Provider [place]  02/16/2020 10:00 AM Vicie Mutters, PA-C [id number] None    HPI  43 y.o. female  presents for  follow up for chol, DM, seizures, anemia.    Her blood pressure has been controlled at home, today their BP is BP: 120/90 She does workout she is walking more due to new puppy, lily.  She denies chest pain, shortness of breath, dizziness.   She has a productive cough, mostly in the morning. Has not had CXR.   She is not on cholesterol medication and denies myalgias. Her cholesterol is at goal. The cholesterol last visit was:   Lab Results  Component Value Date   CHOL 182 03/21/2018   HDL 53 03/21/2018   LDLCALC 102 (H) 03/21/2018   TRIG 134 03/21/2018    She has been working on diet and exercise for diabetes  with hyperlipidemia not on medications Without CKD She is on ozempic 1 mg once a week No CAD, CHF, or PVD denies paresthesia of the feet, polydipsia, polyuria and visual disturbances.  Last A1C in the office was:  Lab Results  Component Value Date   HGBA1C 6.7 (H) 03/21/2018     Lab Results  Component Value Date   GFRNONAA 110  03/21/2018   Patient is on Vitamin D supplement.   Lab Results  Component Value Date   VD25OH 35.5 03/21/2018     She is on thyroid medication. Her medication was not changed last visit.   Lab Results  Component Value Date   TSH 0.088 (L) 03/21/2018  .   She is on trazodone for sleep, takes PRN, uses once a month.  She is on the valium PRN as well for anxiety.   She is on the [place] ring and is on it continuously. She has a history of miscarriages and infertility HOWEVER has seen her GYN.   Due to her gastric bypass she has has severe symptomatic iron def anemia, she has had an iron transfusion and is feeling better.  Lab Results  Component Value Date   IRON 128 03/21/2018   TIBC 393 03/21/2018   FERRITIN 155 (H) 03/21/2018   She has had epilepsy since she was 68, on valproic acid 250mg , has not had a seizure in a decade, had a trial off but had seizures. Should remain on medication. Follows with Dr. Tommy Medal.  She is 6 years s/p gastric bypass, she was 260 before the surgery and got down to 167.  She would like to get back to 180.  BMI is Body mass index is 29.95 kg/m., she is working on diet and exercise.  She has tried naltrexone, wellbutrin (tremor and shakes), and does cycle on and off phentermine. She most  recently had ozempic added for DM and has done well with weight loss and this medication.  Wt Readings from Last 10 Encounters:  05/28/19 197 lb (89.4 kg)  02/12/19 216 lb (98 kg)  03/14/18 216 lb (98 kg)  02/19/18 220 lb (99.8 kg)  12/25/17 213 lb (96.6 kg)  10/21/17 222 lb 9.6 oz (101 kg)  07/16/17 220 lb (99.8 kg)  06/27/17 216 lb 4.8 oz (98.1 kg)  05/08/17 224 lb 3.2 oz (101.7 kg)  05/01/17 225 lb (102.1 kg)    Current Outpatient Medications on File Prior to Visit  Medication Sig  . Crisaborole (EUCRISA) 2 % OINT Apply 1 application topically 2 (two) times daily.  . cyclobenzaprine (FLEXERIL) 10 MG tablet Take 1 tablet at hour of Sleep  . diazepam (VALIUM)  5 MG tablet 1/2 to 1 daily as needed for severe anxiety  . etonogestrel-ethinyl estradiol (NUVARING) 0.12-0.015 MG/24HR vaginal ring Insert one per vagina every 21 days for a total of 4 consecutive readings. Take a three-day break and then start with one ring every 21 days for a total of 4 readings again Please note this requires further rings every 3 months  . fluticasone (FLONASE) 50 MCG/ACT nasal spray PLACE 2 SPRAYS INTO BOTH NOSTRILS AT BEDTIME.  Marland Kitchen levothyroxine (SYNTHROID) 300 MCG tablet Take 1 tablet daily on an empty stomach with only water for 30 minutes & no Antacid meds, Calcium or Magnesium for 4 hours & avoid Biotin  . metFORMIN (GLUCOPHAGE-XR) 500 MG 24 hr tablet TAKE 1 TO 2 TABLETS BY  MOUTH DAILY WITH LARGEST  MEAL. START ON 1 TABLET  ONCE A DAY, AND CAN GO UP  TO 1 TABLET TWICE A DAY.  Marland Kitchen omeprazole (PRILOSEC) 20 MG capsule   . phentermine (ADIPEX-P) 37.5 MG tablet TAKE HALF TO ONE TABLET BY MOUTH EVERY MORNING FOR DIETING AND WEIGHT LOSS  . Semaglutide, 1 MG/DOSE, (OZEMPIC, 1 MG/DOSE,) 2 MG/1.5ML SOPN Inject 1 mg into the skin once a week.  . traZODone (DESYREL) 50 MG tablet Take 1 tablet at Bedtime for Sleep  . valACYclovir (VALTREX) 1000 MG tablet Take 1 tablet (1,000 mg total) by mouth 2 (two) times daily. For 10 days.  Start within 72 hours of symptom onset.  . valproic acid (DEPAKENE) 250 MG capsule Take 2 capsules 3 x /day for Mood  . fluconazole (DIFLUCAN) 150 MG tablet Take 1 tablet (150 mg total) by mouth daily. (Patient not taking: Reported on 05/28/2019)  . SUMAtriptan (IMITREX) 100 MG tablet Take 1 tablet (100 mg total) by mouth once as needed for migraine. May repeat in 2 hours if headache persists or recurs.   No current facility-administered medications on file prior to visit.    Medical History:  Past Medical History:  Diagnosis Date  . Abdominal cramping 08/10/2011  . Anxiety   . Asthma   . Depression   . Habitual abortion history, antepartum   .  Headache(784.0)   . History of diabetes mellitus    prior to gastric bypass in 2012  . History of recurrent miscarriages, not currently pregnant 10/11/2011  . Hypothyroidism   . Infertility associated with anovulation   . Obesity    BMi 33  . Seizures ([place])   . UTI (lower urinary tract infection) 08/14/2011   Allergies Allergies  Allergen Reactions  . Codeine Itching, Rash and Other (See Comments)    OPIOIDS - MORPHINE & RELATED    SURGICAL HISTORY She  has a past surgical history that includes  Gastric bypass (2012) and Dilation and curettage of uterus. FAMILY HISTORY Her family history includes Alcohol abuse in her maternal uncle; Alzheimer's disease in her maternal grandfather, maternal grandmother, and paternal grandmother; Anorexia nervosa in her sister; Anxiety disorder in her maternal uncle; Depression in her maternal grandmother, maternal uncle, and mother; Diabetes in her maternal grandmother and mother. SOCIAL HISTORY She  reports that she quit smoking about 19 years ago. Her smoking use included cigarettes. She has never used smokeless tobacco. She reports current alcohol use of about 2.0 standard drinks of alcohol per week. She reports that she does not use drugs.  Review of Systems: Review of Systems  Constitutional: Negative for chills, diaphoresis, fever, malaise/fatigue and weight loss.  HENT: Negative.   Respiratory: Negative.   Cardiovascular: Negative.   Gastrointestinal: Negative for abdominal pain, blood in stool, constipation, diarrhea, heartburn, melena, nausea and vomiting.  Genitourinary: Negative.   Musculoskeletal: Negative.   Skin: Negative.   Neurological: Negative.  Negative for weakness.  Psychiatric/Behavioral: Negative for depression, hallucinations, memory loss, substance abuse and suicidal ideas. The patient is not nervous/anxious and does not have insomnia.     Physical Exam: Estimated body mass index is 29.95 kg/m as calculated from the  following:   Height as of this encounter: 5\' 8"  (1.727 m).   Weight as of this encounter: 197 lb (89.4 kg). BP 120/90   Pulse 88   Temp 97.7 F (36.5 C)   Ht 5\' 8"  (1.727 m)   Wt 197 lb (89.4 kg)   SpO2 98%   BMI 29.95 kg/m  General Appearance: Well nourished, in no apparent distress.  Eyes: PERRLA, EOMs, conjunctiva no swelling or erythema, normal fundi and vessels.  Sinuses: No Frontal/maxillary tenderness  ENT/Mouth: Ext aud canals clear, normal light reflex with TMs without erythema, bulging. Good dentition. No erythema, swelling, or exudate on post pharynx. Tonsils not swollen or erythematous. Hearing normal. Crowded mouth.  Neck: Supple, thyroid normal. No bruits  Respiratory: Respiratory effort normal, BS equal bilaterally without rales, rhonchi, wheezing or stridor.  Cardio: RRR without murmurs, rubs or gallops. Prominent S2. Brisk peripheral pulses without edema.  Chest: symmetric, with normal excursions and percussion.  Abdomen: Soft, nontender, no guarding, rebound, hernias, masses, or organomegaly. .  Lymphatics: Non tender without lymphadenopathy.  Musculoskeletal: Full ROM all peripheral extremities,5/5 strength, and normal gait.  Skin: Warm, dry without rashes, lesions, ecchymosis. Neuro: Cranial nerves intact, reflexes equal bilaterally. Normal muscle tone, no cerebellar symptoms. Sensation intact.  Psych: Awake and oriented X 3, normal affect, Insight and Judgment appropriate.    Vicie Mutters 4:18 PM [facility]

## 2019-05-28 NOTE — Patient Instructions (Signed)
INFORMATION ABOUT YOUR XRAY  Can walk into [address] for an Insurance account manager. They will have the order and take you back. You do not any paper work, I should get the result back today or tomorrow. This order is good for a year.  Can call [phone] to schedule an appointment if you wish.    Your LDL is not in range or at goal, goal is less than 70.  Your LDL is the bad cholesterol that can lead to heart attack and stroke. To lower your number you can decrease your fatty foods, red meat, cheese, milk and increase fiber like whole grains and veggies. You can also add a fiber supplement like Citracel or Benefiber, these do not cause gas and bloating and are safe to use. Since you have risk factors that make [place] want your number below 70, we need to adjust or add medications to get your number below goal.    General eating tips  What to Avoid . Avoid added sugars o Often added sugar can be found in processed foods such as many condiments, dry cereals, cakes, cookies, chips, crisps, crackers, candies, sweetened drinks, etc.  o Read labels and AVOID/DECREASE use of foods with the following in their ingredient list: Sugar, fructose, high fructose corn syrup, sucrose, glucose, maltose, dextrose, molasses, cane sugar, brown sugar, any type of syrup, agave nectar, etc.   . Avoid snacking in between meals- drink water or if you feel you need a snack, pick a high water content snack such as cucumbers, watermelon, or any veggie.  Marland Kitchen Avoid foods made with flour o If you are going to eat food made with flour, choose those made with whole-grains; and, minimize your consumption as much as is tolerable . Avoid processed foods o These foods are generally stocked in the middle of the grocery store.  o Focus on shopping on the perimeter of the grocery.  What to Include . Vegetables o GREEN LEAFY VEGETABLES: Kale, spinach, mustard greens, collard greens, cabbage, broccoli, etc. o OTHER: Asparagus, cauliflower,  eggplant, carrots, peas, Brussel sprouts, tomatoes, bell peppers, zucchini, beets, cucumbers, etc. . Grains, seeds, and legumes o Beans: kidney beans, black eyed peas, garbanzo beans, black beans, pinto beans, etc. o Whole, unrefined grains: brown rice, barley, bulgur, oatmeal, etc. . Healthy fats  o Avoid highly processed fats such as vegetable oil o Examples of healthy fats: avocado, olives, virgin olive oil, dark chocolate (?72% Cocoa), nuts (peanuts, almonds, walnuts, cashews, pecans, etc.) o Please still do small amount of these healthy fats, they are dense in calories.  . Low - Moderate Intake of Animal Sources of Protein o Meat sources: chicken, [place], salmon, tuna. Limit to 4 ounces of meat at one time or the size of your palm. o Consider limiting dairy sources, but when choosing dairy focus on: PLAIN [place] yogurt, cottage cheese, high-protein milk . Fruit o Choose berries

## 2019-06-22 ENCOUNTER — Telehealth: Payer: 59 | Admitting: Physician Assistant

## 2019-06-22 ENCOUNTER — Ambulatory Visit: Payer: 59 | Admitting: Physician Assistant

## 2019-06-22 DIAGNOSIS — R059 Cough, unspecified: Secondary | ICD-10-CM

## 2019-06-22 DIAGNOSIS — R05 Cough: Secondary | ICD-10-CM

## 2019-06-22 MED ORDER — PROMETHAZINE-DM 6.25-15 MG/5ML PO SYRP: 5.0000 mL | ORAL_SOLUTION | Freq: Four times a day (QID) | ORAL | 1 refills | Status: DC | PRN

## 2019-06-22 MED ORDER — DEXAMETHASONE 0.5 MG PO TABS: ORAL_TABLET | ORAL | 0 refills | Status: DC

## 2019-06-22 MED ORDER — DOXYCYCLINE HYCLATE 100 MG PO CAPS: ORAL_CAPSULE | ORAL | 0 refills | Status: DC

## 2019-06-22 MED ORDER — FLUCONAZOLE 150 MG PO TABS: 150.0000 mg | ORAL_TABLET | Freq: Every day | ORAL | 3 refills | Status: DC

## 2019-06-22 NOTE — Telephone Encounter (Signed)
Subjective:    Patient ID: Lisa [name], female    DOB: [birth year], 43 y.o.   MRN: [id number]  HPI 43 y.o. smoking WF calls with cough.  Brother tested + for covid Christmas day friday, saw him Wednesday before. She has had her symptoms x 1 week.  She has cough, runny nose, and fatigue, no fever, chills. She has been on advil cold/sinus, sudafed, mucenix, allegra.   Medications Current Outpatient Medications on File Prior to Visit  Medication Sig  . Crisaborole (EUCRISA) 2 % OINT Apply 1 application topically 2 (two) times daily.  . cyclobenzaprine (FLEXERIL) 10 MG tablet Take 1 tablet at hour of Sleep  . diazepam (VALIUM) 5 MG tablet 1/2 to 1 daily as needed for severe anxiety  . etonogestrel-ethinyl estradiol (NUVARING) 0.12-0.015 MG/24HR vaginal ring Insert one per vagina every 21 days for a total of 4 consecutive readings. Take a three-day break and then start with one ring every 21 days for a total of 4 readings again Please note this requires further rings every 3 months  . fluconazole (DIFLUCAN) 150 MG tablet Take 1 tablet (150 mg total) by mouth daily. (Patient not taking: Reported on 05/28/2019)  . fluticasone (FLONASE) 50 MCG/ACT nasal spray PLACE 2 SPRAYS INTO BOTH NOSTRILS AT BEDTIME.  Marland Kitchen levothyroxine (SYNTHROID) 300 MCG tablet Take 1 tablet daily on an empty stomach with only water for 30 minutes & no Antacid meds, Calcium or Magnesium for 4 hours & avoid Biotin  . metFORMIN (GLUCOPHAGE-XR) 500 MG 24 hr tablet TAKE 1 TO 2 TABLETS BY  MOUTH DAILY WITH LARGEST  MEAL. START ON 1 TABLET  ONCE A DAY, AND CAN GO UP  TO 1 TABLET TWICE A DAY.  Marland Kitchen omeprazole (PRILOSEC) 20 MG capsule   . phentermine (ADIPEX-P) 37.5 MG tablet TAKE HALF TO ONE TABLET BY MOUTH EVERY MORNING FOR DIETING AND WEIGHT LOSS  . Semaglutide, 1 MG/DOSE, (OZEMPIC, 1 MG/DOSE,) 2 MG/1.5ML SOPN Inject 1 mg into the skin once a week.  . SUMAtriptan (IMITREX) 100 MG tablet Take 1 tablet (100 mg total) by mouth once as  needed for migraine. May repeat in 2 hours if headache persists or recurs.  . traZODone (DESYREL) 50 MG tablet Take 1 tablet at Bedtime for Sleep  . valACYclovir (VALTREX) 1000 MG tablet Take 1 tablet (1,000 mg total) by mouth 2 (two) times daily. For 10 days.  Start within 72 hours of symptom onset.  . valproic acid (DEPAKENE) 250 MG capsule Take 2 capsules 3 x /day for Mood   No current facility-administered medications on file prior to visit.    Problem list She has Depression; PTSD (post-traumatic stress disorder); Obesity surgery status; Seizure disorder ([place]); Type 2 diabetes mellitus with hyperlipidemia ([place]); Hypothyroidism; Panic attacks; Dissociative identity disorder [facility]); Anxiety; Obesity; Vitamin D deficiency; Hyperlipidemia LDL goal <70; Medication management; and Iron deficiency anemia on their problem list.     Objective:   Physical Exam  General Appearance:Well sounding, in no apparent distress.  ENT/Mouth: No hoarseness, No cough for duration of visit.  Respiratory: completing full sentences without distress, without audible wheeze Neuro: Awake and oriented X 3,  Psych:  Insight and Judgment appropriate.       Assessment & Plan:    Cough She has trial of trelegy at home that she has not started Will hold off on decadron for 2-3 days to see if the inhaled steroid will help Advised to quit smoking Getting tested for COVID today will do self  isolation at home Nasal steroids, allergy pill, staying hydrated Follow up as needed via mychart or text. Please go to the ER or call 911 if symptoms become worse.   -     promethazine-dextromethorphan (PROMETHAZINE-DM) 6.25-15 MG/5ML syrup; Take 5 mLs by mouth 4 (four) times daily as needed for cough. -     doxycycline (VIBRAMYCIN) 100 MG capsule; Take 1 capsule twice daily with food -     fluconazole (DIFLUCAN) 150 MG tablet; Take 1 tablet (150 mg total) by mouth daily. -     dexamethasone (DECADRON) 0.5 MG tablet; take 1  tablet PO BID for 3 days, then take 1 tablet PO for 4 days.

## 2019-06-22 NOTE — Progress Notes (Deleted)
   Subjective:    Patient ID: Lisa [name], female    DOB: January 24, 1976, 43 y.o.   MRN: [id number]  HPI 43 y.o. white smoking female presents with SOB.   There were no vitals taken for this visit.  Medications  Current Outpatient Medications (Endocrine & Metabolic):  .  etonogestrel-ethinyl estradiol (NUVARING) 0.12-0.015 MG/24HR vaginal ring, Insert one per vagina every 21 days for a total of 4 consecutive readings. Take a three-day break and then start with one ring every 21 days for a total of 4 readings again Please note this requires further rings every 3 months .  levothyroxine (SYNTHROID) 300 MCG tablet, Take 1 tablet daily on an empty stomach with only water for 30 minutes & no Antacid meds, Calcium or Magnesium for 4 hours & avoid Biotin .  metFORMIN (GLUCOPHAGE-XR) 500 MG 24 hr tablet, TAKE 1 TO 2 TABLETS BY  MOUTH DAILY WITH LARGEST  MEAL. START ON 1 TABLET  ONCE A DAY, AND CAN GO UP  TO 1 TABLET TWICE A DAY. Marland Kitchen  Semaglutide, 1 MG/DOSE, (OZEMPIC, 1 MG/DOSE,) 2 MG/1.5ML SOPN, Inject 1 mg into the skin once a week.   Current Outpatient Medications (Respiratory):  .  fluticasone (FLONASE) 50 MCG/ACT nasal spray, PLACE 2 SPRAYS INTO BOTH NOSTRILS AT BEDTIME.  Current Outpatient Medications (Analgesics):  Marland Kitchen  SUMAtriptan (IMITREX) 100 MG tablet, Take 1 tablet (100 mg total) by mouth once as needed for migraine. May repeat in 2 hours if headache persists or recurs.   Current Outpatient Medications (Other):  Marland Kitchen  Crisaborole (EUCRISA) 2 % OINT, Apply 1 application topically 2 (two) times daily. .  cyclobenzaprine (FLEXERIL) 10 MG tablet, Take 1 tablet at hour of Sleep .  diazepam (VALIUM) 5 MG tablet, 1/2 to 1 daily as needed for severe anxiety .  fluconazole (DIFLUCAN) 150 MG tablet, Take 1 tablet (150 mg total) by mouth daily. (Patient not taking: Reported on 05/28/2019) .  omeprazole (PRILOSEC) 20 MG capsule,  .  phentermine (ADIPEX-P) 37.5 MG tablet, TAKE HALF TO ONE TABLET BY MOUTH  EVERY MORNING FOR DIETING AND WEIGHT LOSS .  traZODone (DESYREL) 50 MG tablet, Take 1 tablet at Bedtime for Sleep .  valACYclovir (VALTREX) 1000 MG tablet, Take 1 tablet (1,000 mg total) by mouth 2 (two) times daily. For 10 days.  Start within 72 hours of symptom onset. .  valproic acid (DEPAKENE) 250 MG capsule, Take 2 capsules 3 x /day for Mood  Problem list She has Depression; PTSD (post-traumatic stress disorder); Obesity surgery status; Seizure disorder ([place]); Type 2 diabetes mellitus with hyperlipidemia ([place]); Hypothyroidism; Panic attacks; Dissociative identity disorder [facility]); Anxiety; Obesity; Vitamin D deficiency; Hyperlipidemia LDL goal <70; Medication management; and Iron deficiency anemia on their problem list.   Review of Systems     Objective:   Physical Exam        Assessment & Plan:  URI versus COVID Currently mild symptoms, will get tested for COVID and will do self isolation at home Nasal steroids, allergy pill, staying hydrated, with history of smoking will give inhaler and antibiotic Follow up as needed via mychart or text. Please go to the ER or call 911 if symptoms become worse.   Suggest getting tested for COVID, will isolate in the mean time.  The patient wore a mask the entire visit and I was wearing an N95 and face shield.  Emphasized social distancing, proper face mask wearing, and hand washing.

## 2019-08-06 ENCOUNTER — Encounter: Payer: Self-pay | Admitting: Physician Assistant

## 2019-08-21 ENCOUNTER — Other Ambulatory Visit: Payer: Self-pay | Admitting: Physician Assistant

## 2019-08-22 LAB — LIPID PANEL W/O CHOL/HDL RATIO
Cholesterol, Total: 192 mg/dL (ref 100–199)
HDL: 78 mg/dL (ref >39)
LDL Chol Calc (NIH): 90 mg/dL (ref 0–99)
Triglycerides: 143 mg/dL (ref 0–149)
VLDL Cholesterol Cal: 24 mg/dL (ref 5–40)

## 2019-08-22 LAB — COMPREHENSIVE METABOLIC PANEL
ALT: 32 IU/L (ref 0–32)
AST: 26 IU/L (ref 0–40)
Albumin/Globulin Ratio: 1.6 (ref 1.2–2.2)
Albumin: 4.4 g/dL (ref 3.8–4.8)
Alkaline Phosphatase: 45 IU/L (ref 39–117)
BUN/Creatinine Ratio: 12 (ref 9–23)
BUN: 8 mg/dL (ref 6–24)
Bilirubin Total: 0.4 mg/dL (ref 0.0–1.2)
CO2: 20 mmol/L (ref 20–29)
Calcium: 9.6 mg/dL (ref 8.7–10.2)
Chloride: 102 mmol/L (ref 96–106)
Creatinine, Ser: 0.68 mg/dL (ref 0.57–1.00)
GFR calc Af Amer: 124 mL/min/{1.73_m2} (ref >59)
GFR calc non Af Amer: 107 mL/min/{1.73_m2} (ref >59)
Globulin, Total: 2.7 g/dL (ref 1.5–4.5)
Glucose: 82 mg/dL (ref 65–99)
Potassium: 4.4 mmol/L (ref 3.5–5.2)
Sodium: 138 mmol/L (ref 134–144)
Total Protein: 7.1 g/dL (ref 6.0–8.5)

## 2019-08-22 LAB — CBC
Hematocrit: 42.7 % (ref 34.0–46.6)
Hemoglobin: 14.5 g/dL (ref 11.1–15.9)
MCH: 32.6 pg (ref 26.6–33.0)
MCHC: 34 g/dL (ref 31.5–35.7)
MCV: 96 fL (ref 79–97)
Platelets: 332 10*3/uL (ref 150–450)
RBC: 4.45 x10E6/uL (ref 3.77–5.28)
RDW: 11.5 % — ABNORMAL LOW (ref 11.7–15.4)
WBC: 10.8 10*3/uL (ref 3.4–10.8)

## 2019-08-22 LAB — IRON: Iron: 95 ug/dL (ref 27–159)

## 2019-08-22 LAB — VITAMIN B12: Vitamin B-12: 774 pg/mL (ref 232–1245)

## 2019-08-22 LAB — VITAMIN D 25 HYDROXY (VIT D DEFICIENCY, FRACTURES): Vit D, 25-Hydroxy: 28.3 ng/mL — ABNORMAL LOW (ref 30.0–100.0)

## 2019-08-22 LAB — HGB A1C W/O EAG: Hgb A1c MFr Bld: 5.8 % — ABNORMAL HIGH (ref 4.8–5.6)

## 2019-08-22 LAB — MAGNESIUM: Magnesium: 2.1 mg/dL (ref 1.6–2.3)

## 2019-08-22 LAB — FERRITIN: Ferritin: 89 ng/mL (ref 15–150)

## 2019-08-22 LAB — TSH: TSH: 0.451 u[IU]/mL (ref 0.450–4.500)

## 2019-08-26 NOTE — Progress Notes (Deleted)
FOLLOW UP  Assessment and Plan: Type 2 diabetes mellitus with hyperlipidemia (HCC) Continue medication, doing very well with ozempic 1 mg  Hypothyroidism, unspecified type Hypothyroidism-check TSH level, continue medications the same, reminded to take on an empty stomach 30-72mins before food.   Seizure disorder ([place]) Continue medications  Recurrent major depressive disorder, in partial remission ([place]) Continue to see psych  Medication management  Obesity surgery status Continue weight loss continue to increase exercise  Vitamin D deficiency Continue supplement  cough  -     DG Chest 2 View; Future    Discussed med's effects and SE's. Screening labs and tests as requested with regular follow-up as recommended. [facility], HAS RX FOR LABS AND WILL GET DONE Future Appointments  Date Time Provider [place]  08/27/2019  3:30 PM Vicie Mutters, PA-C [facility] None  02/16/2020 10:00 AM Vicie Mutters, PA-C [facility] None    HPI  44 y.o. female  presents for  follow up for chol, DM, seizures, anemia.    Her blood pressure has been controlled at home, today their BP is   She does workout she is walking more due to new puppy, lily.  She denies chest pain, shortness of breath, dizziness.   She has a productive cough, mostly in the morning. Has not had CXR.   She is not on cholesterol medication and denies myalgias. Her cholesterol is at goal. The cholesterol last visit was:   Lab Results  Component Value Date   CHOL 192 08/21/2019   HDL 78 08/21/2019   [place] 90 08/21/2019   TRIG 143 08/21/2019    She has been working on diet and exercise for diabetes  with hyperlipidemia not on medications Without CKD She is on ozempic 1 mg once a week No CAD, CHF, or PVD denies paresthesia of the feet, polydipsia, polyuria and visual disturbances.  Last A1C in the office was:  Lab Results  Component Value Date   HGBA1C 5.8 (H) 08/21/2019     Lab Results   Component Value Date   GFRNONAA 107 08/21/2019   Patient is on Vitamin D supplement.   Lab Results  Component Value Date   VD25OH 28.3 (L) 08/21/2019     She is on thyroid medication. Her medication was not changed last visit.   Lab Results  Component Value Date   TSH 0.451 08/21/2019  .   She is on trazodone for sleep, takes PRN, uses once a month.  She is on the valium PRN as well for anxiety.   She is on the [place] ring and is on it continuously. She has a history of miscarriages and infertility HOWEVER has seen her GYN.   Due to her gastric bypass she has has severe symptomatic iron def anemia, she has had an iron transfusion and is feeling better.  Lab Results  Component Value Date   IRON 95 08/21/2019   TIBC 393 03/21/2018   FERRITIN 89 08/21/2019   She has had epilepsy since she was 14, on valproic acid 250mg , has not had a seizure in a decade, had a trial off but had seizures. Should remain on medication. Follows with Dr. Tommy Medal.  She is 6 years s/p gastric bypass, she was 260 before the surgery and got down to 167.  She would like to get back to 180.  BMI is There is no height or weight on file to calculate BMI., she is working on diet and exercise.  She has tried naltrexone,  wellbutrin (tremor and shakes), and does cycle on and off phentermine. She most recently had ozempic added for DM and has done well with weight loss and this medication.  Wt Readings from Last 10 Encounters:  05/28/19 197 lb (89.4 kg)  02/12/19 216 lb (98 kg)  03/14/18 216 lb (98 kg)  02/19/18 220 lb (99.8 kg)  12/25/17 213 lb (96.6 kg)  10/21/17 222 lb 9.6 oz (101 kg)  07/16/17 220 lb (99.8 kg)  06/27/17 216 lb 4.8 oz (98.1 kg)  05/08/17 224 lb 3.2 oz (101.7 kg)  05/01/17 225 lb (102.1 kg)    Current Outpatient Medications on File Prior to Visit  Medication Sig  . Crisaborole (EUCRISA) 2 % OINT Apply 1 application topically 2 (two) times daily.  . cyclobenzaprine (FLEXERIL) 10 MG  tablet Take 1 tablet at hour of Sleep  . dexamethasone (DECADRON) 0.5 MG tablet take 1 tablet PO BID for 3 days, then take 1 tablet PO for 4 days.  . diazepam (VALIUM) 5 MG tablet 1/2 to 1 daily as needed for severe anxiety  . doxycycline (VIBRAMYCIN) 100 MG capsule Take 1 capsule twice daily with food  . etonogestrel-ethinyl estradiol (NUVARING) 0.12-0.015 MG/24HR vaginal ring Insert one per vagina every 21 days for a total of 4 consecutive readings. Take a three-day break and then start with one ring every 21 days for a total of 4 readings again Please note this requires further rings every 3 months  . fluconazole (DIFLUCAN) 150 MG tablet Take 1 tablet (150 mg total) by mouth daily.  . fluticasone (FLONASE) 50 MCG/ACT nasal spray PLACE 2 SPRAYS INTO BOTH NOSTRILS AT BEDTIME.  Marland Kitchen levothyroxine (SYNTHROID) 300 MCG tablet Take 1 tablet daily on an empty stomach with only water for 30 minutes & no Antacid meds, Calcium or Magnesium for 4 hours & avoid Biotin  . metFORMIN (GLUCOPHAGE-XR) 500 MG 24 hr tablet TAKE 1 TO 2 TABLETS BY  MOUTH DAILY WITH LARGEST  MEAL. START ON 1 TABLET  ONCE A DAY, AND CAN GO UP  TO 1 TABLET TWICE A DAY.  Marland Kitchen omeprazole (PRILOSEC) 20 MG capsule   . phentermine (ADIPEX-P) 37.5 MG tablet TAKE HALF TO ONE TABLET BY MOUTH EVERY MORNING FOR DIETING AND WEIGHT LOSS  . promethazine-dextromethorphan (PROMETHAZINE-DM) 6.25-15 MG/5ML syrup Take 5 mLs by mouth 4 (four) times daily as needed for cough.  . Semaglutide, 1 MG/DOSE, (OZEMPIC, 1 MG/DOSE,) 2 MG/1.5ML SOPN Inject 1 mg into the skin once a week.  . SUMAtriptan (IMITREX) 100 MG tablet Take 1 tablet (100 mg total) by mouth once as needed for migraine. May repeat in 2 hours if headache persists or recurs.  . traZODone (DESYREL) 50 MG tablet Take 1 tablet at Bedtime for Sleep  . valACYclovir (VALTREX) 1000 MG tablet Take 1 tablet (1,000 mg total) by mouth 2 (two) times daily. For 10 days.  Start within 72 hours of symptom onset.  .  valproic acid (DEPAKENE) 250 MG capsule Take 2 capsules 3 x /day for Mood   No current facility-administered medications on file prior to visit.   Medical History:  Past Medical History:  Diagnosis Date  . Abdominal cramping 08/10/2011  . Anxiety   . Asthma   . Depression   . Habitual abortion history, antepartum   . Headache(784.0)   . History of diabetes mellitus    prior to gastric bypass in 2012  . History of recurrent miscarriages, not currently pregnant 10/11/2011  . Hypothyroidism   . Infertility  associated with anovulation   . Obesity    BMi 33  . Seizures ([place])   . UTI (lower urinary tract infection) 08/14/2011   Allergies Allergies  Allergen Reactions  . Codeine Itching, Rash and Other (See Comments)    OPIOIDS - MORPHINE & RELATED    SURGICAL HISTORY She  has a past surgical history that includes Gastric bypass (2012) and Dilation and curettage of uterus. FAMILY HISTORY Her family history includes Alcohol abuse in her maternal uncle; Alzheimer's disease in her maternal grandfather, maternal grandmother, and paternal grandmother; Anorexia nervosa in her sister; Anxiety disorder in her maternal uncle; Depression in her maternal grandmother, maternal uncle, and mother; Diabetes in her maternal grandmother and mother. SOCIAL HISTORY She  reports that she quit smoking about 19 years ago. Her smoking use included cigarettes. She has never used smokeless tobacco. She reports current alcohol use of about 2.0 standard drinks of alcohol per week. She reports that she does not use drugs.  Review of Systems: Review of Systems  Constitutional: Negative for chills, diaphoresis, fever, malaise/fatigue and weight loss.  HENT: Negative.   Respiratory: Negative.   Cardiovascular: Negative.   Gastrointestinal: Negative for abdominal pain, blood in stool, constipation, diarrhea, heartburn, melena, nausea and vomiting.  Genitourinary: Negative.   Musculoskeletal: Negative.   Skin:  Negative.   Neurological: Negative.  Negative for weakness.  Psychiatric/Behavioral: Negative for depression, hallucinations, memory loss, substance abuse and suicidal ideas. The patient is not nervous/anxious and does not have insomnia.     Physical Exam: Estimated body mass index is 29.95 kg/m as calculated from the following:   Height as of 05/28/19: 5\' 8"  (1.727 m).   Weight as of 05/28/19: 197 lb (89.4 kg). There were no vitals taken for this visit. General Appearance: Well nourished, in no apparent distress.  Eyes: PERRLA, EOMs, conjunctiva no swelling or erythema, normal fundi and vessels.  Sinuses: No Frontal/maxillary tenderness  ENT/Mouth: Ext aud canals clear, normal light reflex with TMs without erythema, bulging. Good dentition. No erythema, swelling, or exudate on post pharynx. Tonsils not swollen or erythematous. Hearing normal. Crowded mouth.  Neck: Supple, thyroid normal. No bruits  Respiratory: Respiratory effort normal, BS equal bilaterally without rales, rhonchi, wheezing or stridor.  Cardio: RRR without murmurs, rubs or gallops. Prominent S2. Brisk peripheral pulses without edema.  Chest: symmetric, with normal excursions and percussion.  Abdomen: Soft, nontender, no guarding, rebound, hernias, masses, or organomegaly. .  Lymphatics: Non tender without lymphadenopathy.  Musculoskeletal: Full ROM all peripheral extremities,5/5 strength, and normal gait.  Skin: Warm, dry without rashes, lesions, ecchymosis. Neuro: Cranial nerves intact, reflexes equal bilaterally. Normal muscle tone, no cerebellar symptoms. Sensation intact.  Psych: Awake and oriented X 3, normal affect, Insight and Judgment appropriate.    Vicie Mutters 1:28 PM [facility]

## 2019-08-27 ENCOUNTER — Ambulatory Visit: Payer: 59 | Admitting: Physician Assistant

## 2019-09-02 NOTE — Progress Notes (Signed)
FOLLOW UP  Assessment and Plan: Type 2 diabetes mellitus with hyperlipidemia (HCC) Continue medication, doing very well with ozempic 1 mg  Hypothyroidism, unspecified type Hypothyroidism-check TSH level, continue medications the same, reminded to take on an empty stomach 30-50mins before food.   Seizure disorder ([place]) Continue medications  Recurrent major depressive disorder, in partial remission ([place]) Continue to see psych  Medication management  Obesity surgery status Continue weight loss continue to increase exercise  Vitamin D deficiency Continue supplement  cough  -     DG Chest 2 View; Future  Polyarthritis Will check RA labs with family history  Discussed med's effects and SE's. Screening labs and tests as requested with regular follow-up as recommended. [facility], HAS RX FOR LABS AND WILL GET DONE Future Appointments  Date Time Provider [place]  02/16/2020 10:00 AM Vicie Mutters, PA-C [facility] None   zz HPI  44 y.o. female  presents for  follow up for chol, DM, seizures, anemia.    Her blood pressure has been controlled at home, today their BP is BP: 128/86 She does workout she is walking more due to new puppy, lily.  She denies chest pain, shortness of breath, dizziness.   She has been having more nausea/vomiting and heart burn, ie with pot pie, pizza, ribs. She has not had an [place], labs were good.   She had bilateral hand pain, worse PIP/DIP, painful to make a fist, does have a family history of RA. No feet pain, no rash. Is bruising easier. Has had some ankle swelling/pain at [facility] of bilateral feet.   She has a productive cough, mostly in the morning. Has not had CXR.   She is not on cholesterol medication and denies myalgias. Her cholesterol is at goal. The cholesterol last visit was:   Lab Results  Component Value Date   CHOL 192 08/21/2019   HDL 78 08/21/2019   [place] 90 08/21/2019   TRIG 143 08/21/2019    She has been working  on diet and exercise for diabetes  with hyperlipidemia not on medications Without CKD She is on ozempic 1 mg once a week No CAD, CHF, or PVD denies paresthesia of the feet, polydipsia, polyuria and visual disturbances.  Last A1C in the office was:  Lab Results  Component Value Date   HGBA1C 5.8 (H) 08/21/2019     Lab Results  Component Value Date   GFRNONAA 107 08/21/2019   Patient is on Vitamin D supplement.   Lab Results  Component Value Date   VD25OH 28.3 (L) 08/21/2019     She is on thyroid medication. Her medication was not changed last visit.   Lab Results  Component Value Date   TSH 0.451 08/21/2019  .   She is on trazodone for sleep, takes PRN, uses once a month.  She is on the valium PRN as well for anxiety.   She is on the [place] ring and is on it continuously. She has a history of miscarriages and infertility HOWEVER has seen her GYN.   Due to her gastric bypass she has has severe symptomatic iron def anemia, she has had an iron transfusion and is feeling better.  Lab Results  Component Value Date   IRON 95 08/21/2019   TIBC 393 03/21/2018   FERRITIN 89 08/21/2019   She has had epilepsy since she was 14, on valproic acid 250mg , has not had a seizure in a decade, had a trial off but had seizures. Should  remain on medication. Follows with Dr. Tommy Medal.  She is 6 years s/p gastric bypass, she was 260 before the surgery and got down to 167.  She would like to get back to 180.  BMI is Body mass index is 27.06 kg/m., she is working on diet and exercise.  She has tried naltrexone, wellbutrin (tremor and shakes), and does cycle on and off phentermine. She most recently had ozempic added for DM and has done well with weight loss and this medication.  Wt Readings from Last 10 Encounters:  09/03/19 178 lb (80.7 kg)  05/28/19 197 lb (89.4 kg)  02/12/19 216 lb (98 kg)  03/14/18 216 lb (98 kg)  02/19/18 220 lb (99.8 kg)  12/25/17 213 lb (96.6 kg)  10/21/17 222 lb 9.6  oz (101 kg)  07/16/17 220 lb (99.8 kg)  06/27/17 216 lb 4.8 oz (98.1 kg)  05/08/17 224 lb 3.2 oz (101.7 kg)    Current Outpatient Medications on File Prior to Visit  Medication Sig  . Crisaborole (EUCRISA) 2 % OINT Apply 1 application topically 2 (two) times daily.  . cyclobenzaprine (FLEXERIL) 10 MG tablet Take 1 tablet at hour of Sleep  . diazepam (VALIUM) 5 MG tablet 1/2 to 1 daily as needed for severe anxiety  . etonogestrel-ethinyl estradiol (NUVARING) 0.12-0.015 MG/24HR vaginal ring Insert one per vagina every 21 days for a total of 4 consecutive readings. Take a three-day break and then start with one ring every 21 days for a total of 4 readings again Please note this requires further rings every 3 months  . fluticasone (FLONASE) 50 MCG/ACT nasal spray PLACE 2 SPRAYS INTO BOTH NOSTRILS AT BEDTIME.  Marland Kitchen levothyroxine (SYNTHROID) 300 MCG tablet Take 1 tablet daily on an empty stomach with only water for 30 minutes & no Antacid meds, Calcium or Magnesium for 4 hours & avoid Biotin  . metFORMIN (GLUCOPHAGE-XR) 500 MG 24 hr tablet TAKE 1 TO 2 TABLETS BY  MOUTH DAILY WITH LARGEST  MEAL. START ON 1 TABLET  ONCE A DAY, AND CAN GO UP  TO 1 TABLET TWICE A DAY.  Marland Kitchen omeprazole (PRILOSEC) 20 MG capsule   . phentermine (ADIPEX-P) 37.5 MG tablet TAKE HALF TO ONE TABLET BY MOUTH EVERY MORNING FOR DIETING AND WEIGHT LOSS  . Semaglutide, 1 MG/DOSE, (OZEMPIC, 1 MG/DOSE,) 2 MG/1.5ML SOPN Inject 1 mg into the skin once a week.  . traZODone (DESYREL) 50 MG tablet Take 1 tablet at Bedtime for Sleep  . valACYclovir (VALTREX) 1000 MG tablet Take 1 tablet (1,000 mg total) by mouth 2 (two) times daily. For 10 days.  Start within 72 hours of symptom onset.  . valproic acid (DEPAKENE) 250 MG capsule Take 2 capsules 3 x /day for Mood  . SUMAtriptan (IMITREX) 100 MG tablet Take 1 tablet (100 mg total) by mouth once as needed for migraine. May repeat in 2 hours if headache persists or recurs.   No current  facility-administered medications on file prior to visit.   Medical History:  Past Medical History:  Diagnosis Date  . Abdominal cramping 08/10/2011  . Anxiety   . Asthma   . Depression   . Habitual abortion history, antepartum   . Headache(784.0)   . History of diabetes mellitus    prior to gastric bypass in 2012  . History of recurrent miscarriages, not currently pregnant 10/11/2011  . Hypothyroidism   . Infertility associated with anovulation   . Obesity    BMi 33  . Seizures ([place])   .  UTI (lower urinary tract infection) 08/14/2011   Allergies Allergies  Allergen Reactions  . Codeine Itching, Rash and Other (See Comments)    OPIOIDS - MORPHINE & RELATED    SURGICAL HISTORY She  has a past surgical history that includes Gastric bypass (2012) and Dilation and curettage of uterus. FAMILY HISTORY Her family history includes Alcohol abuse in her maternal uncle; Alzheimer's disease in her maternal grandfather, maternal grandmother, and paternal grandmother; Anorexia nervosa in her sister; Anxiety disorder in her maternal uncle; Depression in her maternal grandmother, maternal uncle, and mother; Diabetes in her maternal grandmother and mother. SOCIAL HISTORY She  reports that she quit smoking about 19 years ago. Her smoking use included cigarettes. She has never used smokeless tobacco. She reports current alcohol use of about 2.0 standard drinks of alcohol per week. She reports that she does not use drugs.  Review of Systems: Review of Systems  Constitutional: Negative for chills, diaphoresis, fever, malaise/fatigue and weight loss.  HENT: Negative.   Respiratory: Negative.   Cardiovascular: Negative.   Gastrointestinal: Negative for abdominal pain, blood in stool, constipation, diarrhea, heartburn, melena, nausea and vomiting.  Genitourinary: Negative.   Musculoskeletal: Negative.   Skin: Negative.   Neurological: Negative.  Negative for weakness.  Psychiatric/Behavioral:  Negative for depression, hallucinations, memory loss, substance abuse and suicidal ideas. The patient is not nervous/anxious and does not have insomnia.     Physical Exam: Estimated body mass index is 27.06 kg/m as calculated from the following:   Height as of 05/28/19: 5\' 8"  (1.727 m).   Weight as of this encounter: 178 lb (80.7 kg). BP 128/86   Pulse (!) 103   Temp (!) 97.3 F (36.3 C)   Wt 178 lb (80.7 kg)   SpO2 99%   BMI 27.06 kg/m  General Appearance: Well nourished, in no apparent distress.  Eyes: PERRLA, EOMs, conjunctiva no swelling or erythema, normal fundi and vessels.  Sinuses: No Frontal/maxillary tenderness  ENT/Mouth: Ext aud canals clear, normal light reflex with TMs without erythema, bulging. Good dentition. No erythema, swelling, or exudate on post pharynx. Tonsils not swollen or erythematous. Hearing normal. Crowded mouth.  Neck: Supple, thyroid normal. No bruits  Respiratory: Respiratory effort normal, BS equal bilaterally without rales, rhonchi, wheezing or stridor.  Cardio: RRR without murmurs, rubs or gallops. Prominent S2. Brisk peripheral pulses without edema.  Chest: symmetric, with normal excursions and percussion.  Abdomen: Soft, nontender, no guarding, rebound, hernias, masses, or organomegaly. .  Lymphatics: Non tender without lymphadenopathy.  Musculoskeletal: Full ROM all peripheral extremities,5/5 strength, and normal gait.  Skin: Warm, dry without rashes, lesions, ecchymosis. Neuro: Cranial nerves intact, reflexes equal bilaterally. Normal muscle tone, no cerebellar symptoms. Sensation intact.  Psych: Awake and oriented X 3, normal affect, Insight and Judgment appropriate.    Vicie Mutters 4:04 PM [facility]

## 2019-09-03 ENCOUNTER — Ambulatory Visit: Payer: 59 | Admitting: Physician Assistant

## 2019-09-03 ENCOUNTER — Other Ambulatory Visit: Payer: Self-pay

## 2019-09-03 ENCOUNTER — Encounter: Payer: Self-pay | Admitting: Physician Assistant

## 2019-09-03 VITALS — BP 128/86 | HR 103 | Temp 97.3°F | Wt 178.0 lb

## 2019-09-03 DIAGNOSIS — E039 Hypothyroidism, unspecified: Secondary | ICD-10-CM

## 2019-09-03 DIAGNOSIS — E785 Hyperlipidemia, unspecified: Secondary | ICD-10-CM

## 2019-09-03 DIAGNOSIS — M2550 Pain in unspecified joint: Secondary | ICD-10-CM

## 2019-09-03 DIAGNOSIS — D509 Iron deficiency anemia, unspecified: Secondary | ICD-10-CM | POA: Diagnosis not present

## 2019-09-03 DIAGNOSIS — Z79899 Other long term (current) drug therapy: Secondary | ICD-10-CM | POA: Diagnosis not present

## 2019-09-03 DIAGNOSIS — F3341 Major depressive disorder, recurrent, in partial remission: Secondary | ICD-10-CM

## 2019-09-03 DIAGNOSIS — E1169 Type 2 diabetes mellitus with other specified complication: Secondary | ICD-10-CM

## 2019-09-03 DIAGNOSIS — Z6833 Body mass index (BMI) 33.0-33.9, adult: Secondary | ICD-10-CM

## 2019-09-03 DIAGNOSIS — E559 Vitamin D deficiency, unspecified: Secondary | ICD-10-CM

## 2019-09-03 DIAGNOSIS — N926 Irregular menstruation, unspecified: Secondary | ICD-10-CM

## 2019-09-03 DIAGNOSIS — E66811 Obesity, class 1: Secondary | ICD-10-CM

## 2019-09-03 DIAGNOSIS — E6609 Other obesity due to excess calories: Secondary | ICD-10-CM

## 2019-09-03 DIAGNOSIS — G40909 Epilepsy, unspecified, not intractable, without status epilepticus: Secondary | ICD-10-CM

## 2019-09-03 MED ORDER — VITAMIN D (ERGOCALCIFEROL) 1.25 MG (50000 UNIT) PO CAPS: ORAL_CAPSULE | ORAL | 1 refills | Status: DC

## 2019-09-03 MED ORDER — PHENTERMINE HCL 37.5 MG PO TABS: ORAL_TABLET | ORAL | 0 refills | Status: DC

## 2019-09-03 NOTE — Patient Instructions (Addendum)
INFORMATION ABOUT YOUR XRAY  Can walk into [address] for an Insurance account manager. They will have the order and take you back. You do not any paper work, I should get the result back today or tomorrow. This order is good for a year.  Can call [phone] to schedule an appointment if you wish.   Get voltern gel for your hands Can do papa and barkley CBD oil   Cholelithiasis  Cholelithiasis is a form of gallbladder disease in which gallstones form in the gallbladder. The gallbladder is an organ that stores bile. Bile is made in the liver, and it helps to digest fats. Gallstones begin as small crystals and slowly grow into stones. They may cause no symptoms until the gallbladder tightens (contracts) and a gallstone is blocking the duct (gallbladder attack), which can cause pain. Cholelithiasis is also referred to as gallstones. There are two main types of gallstones:  Cholesterol stones. These are made of hardened cholesterol and are usually yellow-green in color. They are the most common type of gallstone. Cholesterol is a white, waxy, fat-like substance that is made in the liver.  Pigment stones. These are dark in color and are made of a red-yellow substance that forms when hemoglobin from red blood cells breaks down (bilirubin). What are the causes? This condition may be caused by an imbalance in the substances that bile is made of. This can happen if the bile:  Has too much bilirubin.  Has too much cholesterol.  Does not have enough bile salts. These salts help the body absorb and digest fats. In some cases, this condition can also be caused by the gallbladder not emptying completely or often enough. What increases the risk? The following factors may make you more likely to develop this condition:  Being female.  Having multiple pregnancies. Health care providers sometimes advise removing diseased gallbladders before future pregnancies.  Eating a diet that is heavy in fried foods,  fat, and refined carbohydrates, like white bread and white rice.  Being obese.  Being older than age 36.  Prolonged use of medicines that contain female hormones (estrogen).  Having diabetes mellitus.  Rapidly losing weight.  Having a family history of gallstones.  Being of [place] or [place] descent.  Having an intestinal disease such as Crohn disease.  Having metabolic syndrome.  Having cirrhosis.  Having severe types of anemia such as sickle cell anemia. What are the signs or symptoms? In most cases, there are no symptoms. These are known as silent gallstones. If a gallstone blocks the bile ducts, it can cause a gallbladder attack. The main symptom of a gallbladder attack is sudden pain in the upper right abdomen. The pain usually comes at night or after eating a large meal. The pain can last for one or several hours and can spread to the right shoulder or chest. If the bile duct is blocked for more than a few hours, it can cause infection or inflammation of the gallbladder, liver, or pancreas, which may cause:  Nausea.  Vomiting.  Abdominal pain that lasts for 5 hours or more.  Fever or chills.  Yellowing of the skin or the whites of the eyes (jaundice).  Dark urine.  Light-colored stools. How is this diagnosed? This condition may be diagnosed based on:  A physical exam.  Your medical history.  An ultrasound of your gallbladder.  CT scan.  MRI.  Blood tests to check for signs of infection or inflammation.  A scan of your gallbladder  and bile ducts (biliary system) using nonharmful radioactive material and special cameras that can see the radioactive material (cholescintigram). This test checks to see how your gallbladder contracts and whether bile ducts are blocked.  Inserting a small tube with a camera on the end (endoscope) through your mouth to inspect bile ducts and check for blockages (endoscopic retrograde cholangiopancreatogram). How is  this treated? Treatment for gallstones depends on the severity of the condition. Silent gallstones do not need treatment. If the gallstones cause a gallbladder attack or other symptoms, treatment may be required. Options for treatment include:  Surgery to remove the gallbladder (cholecystectomy). This is the most common treatment.  Medicines to dissolve gallstones. These are most effective at treating small gallstones. You may need to take medicines for up to 6-12 months.  Shock wave treatment (extracorporeal biliary lithotripsy). In this treatment, an ultrasound machine sends shock waves to the gallbladder to break gallstones into smaller pieces. These pieces can then be passed into the intestines or be dissolved by medicine. This is rarely used.  Removing gallstones through endoscopic retrograde cholangiopancreatogram. A small basket can be attached to the endoscope and used to capture and remove gallstones. Follow these instructions at home:  Take over-the-counter and prescription medicines only as told by your health care provider.  Maintain a healthy weight and follow a healthy diet. This includes: ? Reducing fatty foods, such as fried food. ? Reducing refined carbohydrates, like white bread and white rice. ? Increasing fiber. Aim for foods like almonds, fruit, and beans.  Keep all follow-up visits as told by your health care provider. This is important. Contact a health care provider if:  You think you have had a gallbladder attack.  You have been diagnosed with silent gallstones and you develop abdominal pain or indigestion. Get help right away if:  You have pain from a gallbladder attack that lasts for more than 2 hours.  You have abdominal pain that lasts for more than 5 hours.  You have a fever or chills.  You have persistent nausea and vomiting.  You develop jaundice.  You have dark urine or light-colored stools. Summary  Cholelithiasis (also called gallstones) is a  form of gallbladder disease in which gallstones form in the gallbladder.  This condition is caused by an imbalance in the substances that make up bile. This can happen if the bile has too much cholesterol, too much bilirubin, or not enough bile salts.  You are more likely to develop this condition if you are female, pregnant, using medicines with estrogen, obese, older than age 70, or have a family history of gallstones. You may also develop gallstones if you have diabetes, an intestinal disease, cirrhosis, or metabolic syndrome.  Treatment for gallstones depends on the severity of the condition. Silent gallstones do not need treatment.  If gallstones cause a gallbladder attack or other symptoms, treatment may be needed. The most common treatment is surgery to remove the gallbladder. This information is not intended to replace advice given to you by your health care provider. Make sure you discuss any questions you have with your health care provider. Document Revised: 05/24/2017 Document Reviewed: 02/26/2016 Elsevier Patient Education  2020 [employer].

## 2019-09-11 MED ORDER — SUMATRIPTAN SUCCINATE 100 MG PO TABS: 100.0000 mg | ORAL_TABLET | Freq: Once | ORAL | 0 refills | Status: DC | PRN

## 2019-09-24 ENCOUNTER — Ambulatory Visit: Payer: 59 | Attending: Internal Medicine

## 2019-09-24 DIAGNOSIS — Z23 Encounter for immunization: Secondary | ICD-10-CM

## 2019-09-24 NOTE — Progress Notes (Signed)
   Covid-19 Vaccination Clinic  Name:  Lisa [name]    MRN: [id number] DOB: [birth year]  09/24/2019  [name] was observed post Covid-19 immunization for 15 minutes without incident. She was provided with Vaccine Information Sheet and instruction to access the V-Safe system.   [name] was instructed to call 911 with any severe reactions post vaccine: Marland Kitchen Difficulty breathing  . Swelling of face and throat  . A fast heartbeat  . A bad rash all over body  . Dizziness and weakness   Immunizations Administered    Name Date Dose VIS Date Route   Pfizer COVID-19 Vaccine 09/24/2019  4:22 PM 0.3 mL 06/05/2019 Intramuscular   Manufacturer: [place]   Lot: [id number]   [place]: [id number]

## 2019-10-21 ENCOUNTER — Ambulatory Visit: Payer: 59 | Attending: Internal Medicine

## 2019-10-21 DIAGNOSIS — Z23 Encounter for immunization: Secondary | ICD-10-CM

## 2019-10-21 NOTE — Progress Notes (Signed)
   Covid-19 Vaccination Clinic  Name:  Sanae Filo    MRN: [id number] DOB: 1975/09/10  10/21/2019  Ms. Waldron was observed post Covid-19 immunization for 15 minutes without incident. She was provided with Vaccine Information Sheet and instruction to access the V-Safe system.   [name] was instructed to call 911 with any severe reactions post vaccine: Marland Kitchen Difficulty breathing  . Swelling of face and throat  . A fast heartbeat  . A bad rash all over body  . Dizziness and weakness   Immunizations Administered    Name Date Dose VIS Date Route   Pfizer COVID-19 Vaccine 10/21/2019  9:11 AM 0.3 mL 08/19/2018 Intramuscular   Manufacturer: [place]   Lot: [id number]   [place]: [id number]

## 2019-11-01 ENCOUNTER — Other Ambulatory Visit: Payer: Self-pay | Admitting: Internal Medicine

## 2019-12-01 ENCOUNTER — Other Ambulatory Visit: Payer: Self-pay | Admitting: Physician Assistant

## 2019-12-01 DIAGNOSIS — E6609 Other obesity due to excess calories: Secondary | ICD-10-CM

## 2019-12-01 DIAGNOSIS — Z6833 Body mass index (BMI) 33.0-33.9, adult: Secondary | ICD-10-CM

## 2019-12-04 ENCOUNTER — Other Ambulatory Visit: Payer: Self-pay | Admitting: Physician Assistant

## 2019-12-04 MED ORDER — AZITHROMYCIN 250 MG PO TABS: ORAL_TABLET | ORAL | 1 refills | Status: AC

## 2019-12-04 MED ORDER — BENZONATATE 200 MG PO CAPS: 200.0000 mg | ORAL_CAPSULE | Freq: Three times a day (TID) | ORAL | 0 refills | Status: DC | PRN

## 2019-12-04 MED ORDER — DEXAMETHASONE 0.5 MG PO TABS: ORAL_TABLET | ORAL | 0 refills | Status: DC

## 2019-12-08 NOTE — Progress Notes (Deleted)
FOLLOW UP  Assessment and Plan: Type 2 diabetes mellitus with hyperlipidemia (HCC) Continue medication, doing very well with ozempic 1 mg  Hypothyroidism, unspecified type Hypothyroidism-check TSH level, continue medications the same, reminded to take on an empty stomach 30-24mins before food.   Seizure disorder ([place]) Continue medications  Recurrent major depressive disorder, in partial remission ([place]) Continue to see psych  Medication management  Obesity surgery status Continue weight loss continue to increase exercise  Vitamin D deficiency Continue supplement  cough  -     DG Chest 2 View; Future  Polyarthritis Will check RA labs with family history  Discussed med's effects and SE's. Screening labs and tests as requested with regular follow-up as recommended. [facility] DONE AT [facility], HAS RX FOR LABS AND WILL GET DONE Future Appointments  Date Time Provider [place]  12/09/2019  4:00 PM Vicie Mutters, PA-C [facility] None  02/16/2020 10:00 AM Vicie Mutters, PA-C [facility] None   zz HPI  44 y.o. female  presents for  follow up for chol, DM, seizures, anemia.    Her blood pressure has been controlled at home, today their BP is   She does workout she is walking more due to new puppy, lily.  She denies chest pain, shortness of breath, dizziness.   She has been having more nausea/vomiting and heart burn, ie with pot pie, pizza, ribs. She has not had an [place], labs were good.   She had bilateral hand pain, worse PIP/DIP, painful to make a fist, does have a family history of RA. No feet pain, no rash. Is bruising easier. Has had some ankle swelling/pain at [facility] of bilateral feet.   She has a productive cough, mostly in the morning. Has not had CXR.   She is not on cholesterol medication and denies myalgias. Her cholesterol is at goal. The cholesterol last visit was:   Lab Results  Component Value Date   CHOL 192 08/21/2019   HDL 78 08/21/2019   [place] 90  08/21/2019   TRIG 143 08/21/2019    She has been working on diet and exercise for diabetes  with hyperlipidemia not on medications Without CKD She is on ozempic 1 mg once a week No CAD, CHF, or PVD denies paresthesia of the feet, polydipsia, polyuria and visual disturbances.  Last A1C in the office was:  Lab Results  Component Value Date   HGBA1C 5.8 (H) 08/21/2019     Lab Results  Component Value Date   GFRNONAA 107 08/21/2019   Patient is on Vitamin D supplement.   Lab Results  Component Value Date   VD25OH 28.3 (L) 08/21/2019     She is on thyroid medication. Her medication was not changed last visit.   Lab Results  Component Value Date   TSH 0.451 08/21/2019  .   She is on trazodone for sleep, takes PRN, uses once a month.  She is on the valium PRN as well for anxiety.   She is on the [place] ring and is on it continuously. She has a history of miscarriages and infertility HOWEVER has seen her GYN.   Due to her gastric bypass she has has severe symptomatic iron def anemia, she has had an iron transfusion and is feeling better.  Lab Results  Component Value Date   IRON 95 08/21/2019   TIBC 393 03/21/2018   FERRITIN 89 08/21/2019   She has had epilepsy since she was 14, on valproic acid 250mg , has not had a seizure in  a decade, had a trial off but had seizures. Should remain on medication. Follows with Dr. Tommy Medal.  She is 6 years s/p gastric bypass, she was 260 before the surgery and got down to 167.  She would like to get back to 180.  BMI is There is no height or weight on file to calculate BMI., she is working on diet and exercise.  She has tried naltrexone, wellbutrin (tremor and shakes), and does cycle on and off phentermine. She most recently had ozempic added for DM and has done well with weight loss and this medication.  Wt Readings from Last 10 Encounters:  09/03/19 178 lb (80.7 kg)  05/28/19 197 lb (89.4 kg)  02/12/19 216 lb (98 kg)  03/14/18 216 lb (98  kg)  02/19/18 220 lb (99.8 kg)  12/25/17 213 lb (96.6 kg)  10/21/17 222 lb 9.6 oz (101 kg)  07/16/17 220 lb (99.8 kg)  06/27/17 216 lb 4.8 oz (98.1 kg)  05/08/17 224 lb 3.2 oz (101.7 kg)    Current Outpatient Medications on File Prior to Visit  Medication Sig  . azithromycin (ZITHROMAX) 250 MG tablet Take 2 tablets (500 mg) on  Day 1,  followed by 1 tablet (250 mg) once daily on Days 2 through 5.  . benzonatate (TESSALON) 200 MG capsule Take 1 capsule (200 mg total) by mouth 3 (three) times daily as needed for cough (Max: 600mg  per day).  Stasia Cavalier (EUCRISA) 2 % OINT Apply 1 application topically 2 (two) times daily.  . cyclobenzaprine (FLEXERIL) 10 MG tablet Take 1 tablet at hour of Sleep  . dexamethasone (DECADRON) 0.5 MG tablet take 1 tablet PO BID for 3 days, then take 1 tablet PO for 4 days.  . diazepam (VALIUM) 5 MG tablet 1/2 to 1 daily as needed for severe anxiety  . etonogestrel-ethinyl estradiol (NUVARING) 0.12-0.015 MG/24HR vaginal ring Insert one per vagina every 21 days for a total of 4 consecutive readings. Take a three-day break and then start with one ring every 21 days for a total of 4 readings again Please note this requires further rings every 3 months  . fluticasone (FLONASE) 50 MCG/ACT nasal spray PLACE 2 SPRAYS INTO BOTH NOSTRILS AT BEDTIME.  Marland Kitchen levothyroxine (SYNTHROID) 300 MCG tablet TAKE 1 TABLET BY MOUTH DAILY ON AN EMPTY STOMACH WITH ONLY WATER FOR 30 MIN AND NO ANTACIDS, CALCIUM OR MAGNESIUM FOR 4 HOURS AND AVOID BIOTIN  . metFORMIN (GLUCOPHAGE-XR) 500 MG 24 hr tablet TAKE 1 TO 2 TABLETS BY  MOUTH DAILY WITH LARGEST  MEAL. START ON 1 TABLET  ONCE A DAY, AND CAN GO UP  TO 1 TABLET TWICE A DAY.  Marland Kitchen omeprazole (PRILOSEC) 20 MG capsule   . Semaglutide, 1 MG/DOSE, (OZEMPIC, 1 MG/DOSE,) 2 MG/1.5ML SOPN Inject 1 mg into the skin once a week.  . SUMAtriptan (IMITREX) 100 MG tablet Take 1 tablet (100 mg total) by mouth once as needed for migraine. May repeat in 2 hours if  headache persists or recurs.  . traZODone (DESYREL) 50 MG tablet Take 1 tablet at Bedtime for Sleep  . valACYclovir (VALTREX) 1000 MG tablet Take 1 tablet (1,000 mg total) by mouth 2 (two) times daily. For 10 days.  Start within 72 hours of symptom onset.  . valproic acid (DEPAKENE) 250 MG capsule Take 2 capsules 3 x /day for Mood  . Vitamin D, Ergocalciferol, (DRISDOL) 1.25 MG (50000 UNIT) CAPS capsule 1 pill 3 days a week for severe vitamin d deficiency  for 8 weeks, then once a week   No current facility-administered medications on file prior to visit.   Medical History:  Past Medical History:  Diagnosis Date  . Abdominal cramping 08/10/2011  . Anxiety   . Asthma   . Depression   . Habitual abortion history, antepartum   . Headache(784.0)   . History of diabetes mellitus    prior to gastric bypass in 2012  . History of recurrent miscarriages, not currently pregnant 10/11/2011  . Hypothyroidism   . Infertility associated with anovulation   . Obesity    BMi 33  . Seizures ([place])   . UTI (lower urinary tract infection) 08/14/2011   Allergies Allergies  Allergen Reactions  . Codeine Itching, Rash and Other (See Comments)    OPIOIDS - MORPHINE & RELATED    SURGICAL HISTORY She  has a past surgical history that includes Gastric bypass (2012) and Dilation and curettage of uterus. FAMILY HISTORY Her family history includes Alcohol abuse in her maternal uncle; Alzheimer's disease in her maternal grandfather, maternal grandmother, and paternal grandmother; Anorexia nervosa in her sister; Anxiety disorder in her maternal uncle; Depression in her maternal grandmother, maternal uncle, and mother; Diabetes in her maternal grandmother and mother. SOCIAL HISTORY She  reports that she quit smoking about 20 years ago. Her smoking use included cigarettes. She has never used smokeless tobacco. She reports current alcohol use of about 2.0 standard drinks of alcohol per week. She reports that she does  not use drugs.  Review of Systems: Review of Systems  Constitutional: Negative for chills, diaphoresis, fever, malaise/fatigue and weight loss.  HENT: Negative.   Respiratory: Negative.   Cardiovascular: Negative.   Gastrointestinal: Negative for abdominal pain, blood in stool, constipation, diarrhea, heartburn, melena, nausea and vomiting.  Genitourinary: Negative.   Musculoskeletal: Negative.   Skin: Negative.   Neurological: Negative.  Negative for weakness.  Psychiatric/Behavioral: Negative for depression, hallucinations, memory loss, substance abuse and suicidal ideas. The patient is not nervous/anxious and does not have insomnia.     Physical Exam: Estimated body mass index is 27.06 kg/m as calculated from the following:   Height as of 05/28/19: 5\' 8"  (1.727 m).   Weight as of 09/03/19: 178 lb (80.7 kg). There were no vitals taken for this visit. General Appearance: Well nourished, in no apparent distress.  Eyes: PERRLA, EOMs, conjunctiva no swelling or erythema, normal fundi and vessels.  Sinuses: No Frontal/maxillary tenderness  ENT/Mouth: Ext aud canals clear, normal light reflex with TMs without erythema, bulging. Good dentition. No erythema, swelling, or exudate on post pharynx. Tonsils not swollen or erythematous. Hearing normal. Crowded mouth.  Neck: Supple, thyroid normal. No bruits  Respiratory: Respiratory effort normal, BS equal bilaterally without rales, rhonchi, wheezing or stridor.  Cardio: RRR without murmurs, rubs or gallops. Prominent S2. Brisk peripheral pulses without edema.  Chest: symmetric, with normal excursions and percussion.  Abdomen: Soft, nontender, no guarding, rebound, hernias, masses, or organomegaly. .  Lymphatics: Non tender without lymphadenopathy.  Musculoskeletal: Full ROM all peripheral extremities,5/5 strength, and normal gait.  Skin: Warm, dry without rashes, lesions, ecchymosis. Neuro: Cranial nerves intact, reflexes equal bilaterally.  Normal muscle tone, no cerebellar symptoms. Sensation intact.  Psych: Awake and oriented X 3, normal affect, Insight and Judgment appropriate.    Vicie Mutters 1:24 PM [facility]

## 2019-12-09 ENCOUNTER — Ambulatory Visit: Payer: 59 | Admitting: Physician Assistant

## 2019-12-23 ENCOUNTER — Ambulatory Visit: Payer: 59 | Admitting: Physician Assistant

## 2019-12-30 NOTE — Progress Notes (Deleted)
FOLLOW UP  Assessment and Plan: Type 2 diabetes mellitus with hyperlipidemia (HCC) Continue medication, doing very well with ozempic 1 mg  Hypothyroidism, unspecified type Hypothyroidism-check TSH level, continue medications the same, reminded to take on an empty stomach 30-74mins before food.   Seizure disorder ([place]) Continue medications  Recurrent major depressive disorder, in partial remission ([place]) Continue to see psych  Medication management  Obesity surgery status Continue weight loss continue to increase exercise  Vitamin D deficiency Continue supplement  cough  -     DG Chest 2 View; Future  Polyarthritis Will check RA labs with family history  Discussed med's effects and SE's. Screening labs and tests as requested with regular follow-up as recommended. [facility], HAS RX FOR LABS AND WILL GET DONE Future Appointments  Date Time Provider [place]  12/31/2019  2:30 PM Vicie Mutters, PA-C [facility] None  02/09/2020  9:00 AM Vicie Mutters, PA-C [facility] None   zz HPI  44 y.o. female  presents for  follow up for chol, DM, seizures, anemia.    Her blood pressure has been controlled at home, today their BP is   She does workout she is walking more due to new puppy, lily.  She denies chest pain, shortness of breath, dizziness.   She has been having more nausea/vomiting and heart burn, ie with pot pie, pizza, ribs. She has not had an [place], labs were good.   She had bilateral hand pain, worse PIP/DIP, painful to make a fist, does have a family history of RA. No feet pain, no rash. Is bruising easier. Has had some ankle swelling/pain at MTP of bilateral feet.   She has a productive cough, mostly in the morning. Has not had CXR.   She is not on cholesterol medication and denies myalgias. Her cholesterol is at goal. The cholesterol last visit was:   Lab Results  Component Value Date   CHOL 192 08/21/2019   HDL 78 08/21/2019   [place] 90 08/21/2019    TRIG 143 08/21/2019    She has been working on diet and exercise for diabetes  with hyperlipidemia not on medications Without CKD She is on ozempic 1 mg once a week No CAD, CHF, or PVD denies paresthesia of the feet, polydipsia, polyuria and visual disturbances.  Last A1C in the office was:  Lab Results  Component Value Date   HGBA1C 5.8 (H) 08/21/2019     Lab Results  Component Value Date   GFRNONAA 107 08/21/2019   Patient is on Vitamin D supplement.   Lab Results  Component Value Date   VD25OH 28.3 (L) 08/21/2019     She is on thyroid medication. Her medication was not changed last visit.   Lab Results  Component Value Date   TSH 0.451 08/21/2019  .   She is on trazodone for sleep, takes PRN, uses once a month.  She is on the valium PRN as well for anxiety.   She is on the [place] ring and is on it continuously. She has a history of miscarriages and infertility HOWEVER has seen her GYN.   Due to her gastric bypass she has has severe symptomatic iron def anemia, she has had an iron transfusion and is feeling better.  Lab Results  Component Value Date   IRON 95 08/21/2019   TIBC 393 03/21/2018   FERRITIN 89 08/21/2019   She has had epilepsy since she was 14, on valproic acid 250mg , has not had a seizure  in a decade, had a trial off but had seizures. Should remain on medication. Follows with Dr. Tommy Medal.  She is 6 years s/p gastric bypass, she was 260 before the surgery and got down to 167.  She would like to get back to 180.  BMI is There is no height or weight on file to calculate BMI., she is working on diet and exercise.  She has tried naltrexone, wellbutrin (tremor and shakes), and does cycle on and off phentermine. She most recently had ozempic added for DM and has done well with weight loss and this medication.  Wt Readings from Last 10 Encounters:  09/03/19 178 lb (80.7 kg)  05/28/19 197 lb (89.4 kg)  02/12/19 216 lb (98 kg)  03/14/18 216 lb (98 kg)   02/19/18 220 lb (99.8 kg)  12/25/17 213 lb (96.6 kg)  10/21/17 222 lb 9.6 oz (101 kg)  07/16/17 220 lb (99.8 kg)  06/27/17 216 lb 4.8 oz (98.1 kg)  05/08/17 224 lb 3.2 oz (101.7 kg)    Current Outpatient Medications on File Prior to Visit  Medication Sig  . benzonatate (TESSALON) 200 MG capsule Take 1 capsule (200 mg total) by mouth 3 (three) times daily as needed for cough (Max: 600mg  per day).  Stasia Cavalier (EUCRISA) 2 % OINT Apply 1 application topically 2 (two) times daily.  . cyclobenzaprine (FLEXERIL) 10 MG tablet Take 1 tablet at hour of Sleep  . dexamethasone (DECADRON) 0.5 MG tablet take 1 tablet PO BID for 3 days, then take 1 tablet PO for 4 days.  . diazepam (VALIUM) 5 MG tablet 1/2 to 1 daily as needed for severe anxiety  . etonogestrel-ethinyl estradiol (NUVARING) 0.12-0.015 MG/24HR vaginal ring Insert one per vagina every 21 days for a total of 4 consecutive readings. Take a three-day break and then start with one ring every 21 days for a total of 4 readings again Please note this requires further rings every 3 months  . fluticasone (FLONASE) 50 MCG/ACT nasal spray PLACE 2 SPRAYS INTO BOTH NOSTRILS AT BEDTIME.  Marland Kitchen levothyroxine (SYNTHROID) 300 MCG tablet TAKE 1 TABLET BY MOUTH DAILY ON AN EMPTY STOMACH WITH ONLY WATER FOR 30 MIN AND NO ANTACIDS, CALCIUM OR MAGNESIUM FOR 4 HOURS AND AVOID BIOTIN  . metFORMIN (GLUCOPHAGE-XR) 500 MG 24 hr tablet TAKE 1 TO 2 TABLETS BY  MOUTH DAILY WITH LARGEST  MEAL. START ON 1 TABLET  ONCE A DAY, AND CAN GO UP  TO 1 TABLET TWICE A DAY.  Marland Kitchen omeprazole (PRILOSEC) 20 MG capsule   . Semaglutide, 1 MG/DOSE, (OZEMPIC, 1 MG/DOSE,) 2 MG/1.5ML SOPN Inject 1 mg into the skin once a week.  . SUMAtriptan (IMITREX) 100 MG tablet Take 1 tablet (100 mg total) by mouth once as needed for migraine. May repeat in 2 hours if headache persists or recurs.  . traZODone (DESYREL) 50 MG tablet Take 1 tablet at Bedtime for Sleep  . valACYclovir (VALTREX) 1000 MG tablet  Take 1 tablet (1,000 mg total) by mouth 2 (two) times daily. For 10 days.  Start within 72 hours of symptom onset.  . valproic acid (DEPAKENE) 250 MG capsule Take 2 capsules 3 x /day for Mood  . Vitamin D, Ergocalciferol, (DRISDOL) 1.25 MG (50000 UNIT) CAPS capsule 1 pill 3 days a week for severe vitamin d deficiency for 8 weeks, then once a week   No current facility-administered medications on file prior to visit.   Medical History:  Past Medical History:  Diagnosis Date  .  Abdominal cramping 08/10/2011  . Anxiety   . Asthma   . Depression   . Habitual abortion history, antepartum   . Headache(784.0)   . History of diabetes mellitus    prior to gastric bypass in 2012  . History of recurrent miscarriages, not currently pregnant 10/11/2011  . Hypothyroidism   . Infertility associated with anovulation   . Obesity    BMi 33  . Seizures ([place])   . UTI (lower urinary tract infection) 08/14/2011   Allergies Allergies  Allergen Reactions  . Codeine Itching, Rash and Other (See Comments)    OPIOIDS - MORPHINE & RELATED    SURGICAL HISTORY She  has a past surgical history that includes Gastric bypass (2012) and Dilation and curettage of uterus. FAMILY HISTORY Her family history includes Alcohol abuse in her maternal uncle; Alzheimer's disease in her maternal grandfather, maternal grandmother, and paternal grandmother; Anorexia nervosa in her sister; Anxiety disorder in her maternal uncle; Depression in her maternal grandmother, maternal uncle, and mother; Diabetes in her maternal grandmother and mother. SOCIAL HISTORY She  reports that she quit smoking about 20 years ago. Her smoking use included cigarettes. She has never used smokeless tobacco. She reports current alcohol use of about 2.0 standard drinks of alcohol per week. She reports that she does not use drugs.  Review of Systems: Review of Systems  Constitutional: Negative for chills, diaphoresis, fever, malaise/fatigue and weight  loss.  HENT: Negative.   Respiratory: Negative.   Cardiovascular: Negative.   Gastrointestinal: Negative for abdominal pain, blood in stool, constipation, diarrhea, heartburn, melena, nausea and vomiting.  Genitourinary: Negative.   Musculoskeletal: Negative.   Skin: Negative.   Neurological: Negative.  Negative for weakness.  Psychiatric/Behavioral: Negative for depression, hallucinations, memory loss, substance abuse and suicidal ideas. The patient is not nervous/anxious and does not have insomnia.     Physical Exam: Estimated body mass index is 27.06 kg/m as calculated from the following:   Height as of 05/28/19: 5\' 8"  (1.727 m).   Weight as of 09/03/19: 178 lb (80.7 kg). There were no vitals taken for this visit. General Appearance: Well nourished, in no apparent distress.  Eyes: PERRLA, EOMs, conjunctiva no swelling or erythema, normal fundi and vessels.  Sinuses: No Frontal/maxillary tenderness  ENT/Mouth: Ext aud canals clear, normal light reflex with TMs without erythema, bulging. Good dentition. No erythema, swelling, or exudate on post pharynx. Tonsils not swollen or erythematous. Hearing normal. Crowded mouth.  Neck: Supple, thyroid normal. No bruits  Respiratory: Respiratory effort normal, BS equal bilaterally without rales, rhonchi, wheezing or stridor.  Cardio: RRR without murmurs, rubs or gallops. Prominent S2. Brisk peripheral pulses without edema.  Chest: symmetric, with normal excursions and percussion.  Abdomen: Soft, nontender, no guarding, rebound, hernias, masses, or organomegaly. .  Lymphatics: Non tender without lymphadenopathy.  Musculoskeletal: Full ROM all peripheral extremities,5/5 strength, and normal gait.  Skin: Warm, dry without rashes, lesions, ecchymosis. Neuro: Cranial nerves intact, reflexes equal bilaterally. Normal muscle tone, no cerebellar symptoms. Sensation intact.  Psych: Awake and oriented X 3, normal affect, Insight and Judgment appropriate.     Vicie Mutters 8:45 PM [facility]

## 2019-12-31 ENCOUNTER — Ambulatory Visit: Payer: 59 | Admitting: Physician Assistant

## 2020-01-05 NOTE — Progress Notes (Signed)
3FOLLOW UP  Assessment and Plan: Type 2 diabetes mellitus with hyperlipidemia (HCC) Continue medication, doing very well with ozempic 1 mg May cut back depending  Hypothyroidism, unspecified type Hypothyroidism-check TSH level, continue medications the same, reminded to take on an empty stomach 30-28mins before food.  With weight loss may need to cut back on medication  Seizure disorder ([place]) Continue medications  Recurrent major depressive disorder, in partial remission ([place]) Continue to see psych  Medication management  Obesity surgery status Continue weight loss continue to increase exercise  Vitamin D deficiency Continue supplement  cough  -     DG Chest 2 View; Future  Polyarthritis Will check RA labs with family history  Discussed med's effects and SE's. Screening labs and tests as requested with regular follow-up as recommended. [facility], HAS RX FOR LABS AND WILL GET DONE Future Appointments  Date Time Provider [place]  02/09/2020  9:00 AM Vicie Mutters, PA-C [facility] None   zz HPI  44 y.o. female  presents for  follow up for chol, DM, seizures, anemia.    Her blood pressure has been controlled at home, today their BP is BP: 128/74 She does workout she is walking more due to new puppy, lily.  She denies chest pain, shortness of breath, dizziness.   She has been having more nausea/vomiting and heart burn, ie with pot pie, pizza, ribs. She has not had an [place], labs were good.   She had bilateral hand pain, worse PIP/DIP, painful to make a fist, does have a family history of RA. No feet pain, no rash. Is bruising easier. Has had some ankle swelling/pain at MTP of bilateral feet.   She has a productive cough, mostly in the morning. Has not had CXR.   She is not on cholesterol medication and denies myalgias. Her cholesterol is at goal. The cholesterol last visit was:   Lab Results  Component Value Date   CHOL 192 08/21/2019   HDL 78  08/21/2019   [place] 90 08/21/2019   TRIG 143 08/21/2019    She has been working on diet and exercise for diabetes  with hyperlipidemia not on medications Without CKD She is on ozempic 1 mg once a week No CAD, CHF, or PVD denies paresthesia of the feet, polydipsia, polyuria and visual disturbances.  Last A1C in the office was:  Lab Results  Component Value Date   HGBA1C 5.8 (H) 08/21/2019     Lab Results  Component Value Date   GFRNONAA 107 08/21/2019   Patient is on Vitamin D supplement.   Lab Results  Component Value Date   VD25OH 28.3 (L) 08/21/2019     She is on thyroid medication. Her medication was not changed last visit.   Lab Results  Component Value Date   TSH 0.451 08/21/2019  .   She is on trazodone for sleep, takes PRN, uses once a month.  She is on the valium PRN as well for anxiety.   She is on the [place] ring and is on it continuously. She has a history of miscarriages and infertility HOWEVER has seen her GYN.   Due to her gastric bypass she has has severe symptomatic iron def anemia, she has had an iron transfusion and is feeling better.  Lab Results  Component Value Date   IRON 95 08/21/2019   TIBC 393 03/21/2018   FERRITIN 89 08/21/2019   She has had epilepsy since she was 14, on valproic acid 250mg , has  not had a seizure in a decade, had a trial off but had seizures. Should remain on medication. Follows with Dr. Tommy Medal.  She is 6 years s/p gastric bypass, she was 260 before the surgery and got down to 167.  She is now at 159 lbs which was lower than her surgery.  BMI is Body mass index is 24.18 kg/m., she is working on diet and exercise.  Wt Readings from Last 10 Encounters:  01/06/20 159 lb (72.1 kg)  09/03/19 178 lb (80.7 kg)  05/28/19 197 lb (89.4 kg)  02/12/19 216 lb (98 kg)  03/14/18 216 lb (98 kg)  02/19/18 220 lb (99.8 kg)  12/25/17 213 lb (96.6 kg)  10/21/17 222 lb 9.6 oz (101 kg)  07/16/17 220 lb (99.8 kg)  06/27/17 216 lb 4.8 oz  (98.1 kg)    Current Outpatient Medications on File Prior to Visit  Medication Sig  . benzonatate (TESSALON) 200 MG capsule Take 1 capsule (200 mg total) by mouth 3 (three) times daily as needed for cough (Max: 600mg  per day).  Stasia Cavalier (EUCRISA) 2 % OINT Apply 1 application topically 2 (two) times daily.  . cyclobenzaprine (FLEXERIL) 10 MG tablet Take 1 tablet at hour of Sleep  . diazepam (VALIUM) 5 MG tablet 1/2 to 1 daily as needed for severe anxiety  . etonogestrel-ethinyl estradiol (NUVARING) 0.12-0.015 MG/24HR vaginal ring Insert one per vagina every 21 days for a total of 4 consecutive readings. Take a three-day break and then start with one ring every 21 days for a total of 4 readings again Please note this requires further rings every 3 months  . fluticasone (FLONASE) 50 MCG/ACT nasal spray PLACE 2 SPRAYS INTO BOTH NOSTRILS AT BEDTIME.  Marland Kitchen levothyroxine (SYNTHROID) 300 MCG tablet TAKE 1 TABLET BY MOUTH DAILY ON AN EMPTY STOMACH WITH ONLY WATER FOR 30 MIN AND NO ANTACIDS, CALCIUM OR MAGNESIUM FOR 4 HOURS AND AVOID BIOTIN  . omeprazole (PRILOSEC) 20 MG capsule   . Semaglutide, 1 MG/DOSE, (OZEMPIC, 1 MG/DOSE,) 2 MG/1.5ML SOPN Inject 1 mg into the skin once a week.  . SUMAtriptan (IMITREX) 100 MG tablet Take 1 tablet (100 mg total) by mouth once as needed for migraine. May repeat in 2 hours if headache persists or recurs.  . traZODone (DESYREL) 50 MG tablet Take 1 tablet at Bedtime for Sleep  . valACYclovir (VALTREX) 1000 MG tablet Take 1 tablet (1,000 mg total) by mouth 2 (two) times daily. For 10 days.  Start within 72 hours of symptom onset.  . valproic acid (DEPAKENE) 250 MG capsule Take 2 capsules 3 x /day for Mood  . Vitamin D, Ergocalciferol, (DRISDOL) 1.25 MG (50000 UNIT) CAPS capsule 1 pill 3 days a week for severe vitamin d deficiency for 8 weeks, then once a week   No current facility-administered medications on file prior to visit.   Medical History:  Past Medical  History:  Diagnosis Date  . Abdominal cramping 08/10/2011  . Anxiety   . Asthma   . Depression   . Habitual abortion history, antepartum   . Headache(784.0)   . History of diabetes mellitus    prior to gastric bypass in 2012  . History of recurrent miscarriages, not currently pregnant 10/11/2011  . Hypothyroidism   . Infertility associated with anovulation   . Obesity    BMi 33  . Seizures ([place])   . UTI (lower urinary tract infection) 08/14/2011   Allergies Allergies  Allergen Reactions  . Codeine Itching, Rash  and Other (See Comments)    OPIOIDS - MORPHINE & RELATED    SURGICAL HISTORY She  has a past surgical history that includes Gastric bypass (2012) and Dilation and curettage of uterus. FAMILY HISTORY Her family history includes Alcohol abuse in her maternal uncle; Alzheimer's disease in her maternal grandfather, maternal grandmother, and paternal grandmother; Anorexia nervosa in her sister; Anxiety disorder in her maternal uncle; Depression in her maternal grandmother, maternal uncle, and mother; Diabetes in her maternal grandmother and mother. SOCIAL HISTORY She  reports that she quit smoking about 20 years ago. Her smoking use included cigarettes. She has never used smokeless tobacco. She reports current alcohol use of about 2.0 standard drinks of alcohol per week. She reports that she does not use drugs.  Review of Systems: Review of Systems  Constitutional: Negative for chills, diaphoresis, fever, malaise/fatigue and weight loss.  HENT: Negative.   Respiratory: Negative.   Cardiovascular: Negative.   Gastrointestinal: Negative for abdominal pain, blood in stool, constipation, diarrhea, heartburn, melena, nausea and vomiting.  Genitourinary: Negative.   Musculoskeletal: Negative.   Skin: Negative.   Neurological: Negative.  Negative for weakness.  Psychiatric/Behavioral: Negative for depression, hallucinations, memory loss, substance abuse and suicidal ideas. The  patient is not nervous/anxious and does not have insomnia.     Physical Exam: Estimated body mass index is 24.18 kg/m as calculated from the following:   Height as of 05/28/19: 5\' 8"  (1.727 m).   Weight as of this encounter: 159 lb (72.1 kg). BP 128/74   Pulse 100   Temp (!) 97.3 F (36.3 C)   Wt 159 lb (72.1 kg)   SpO2 99%   BMI 24.18 kg/m  General Appearance: Well nourished, in no apparent distress.  Eyes: PERRLA, EOMs, conjunctiva no swelling or erythema, normal fundi and vessels.  Sinuses: No Frontal/maxillary tenderness  ENT/Mouth: Ext aud canals clear, normal light reflex with TMs without erythema, bulging. Good dentition. No erythema, swelling, or exudate on post pharynx. Tonsils not swollen or erythematous. Hearing normal. Crowded mouth.  Neck: Supple, thyroid normal. No bruits  Respiratory: Respiratory effort normal, BS equal bilaterally without rales, rhonchi, wheezing or stridor.  Cardio: RRR without murmurs, rubs or gallops. Prominent S2. Brisk peripheral pulses without edema.  Chest: symmetric, with normal excursions and percussion.  Abdomen: Soft, nontender, no guarding, rebound, hernias, masses, or organomegaly. .  Lymphatics: Non tender without lymphadenopathy.  Musculoskeletal: Full ROM all peripheral extremities,5/5 strength, and normal gait.  Skin: Warm, dry without rashes, lesions, ecchymosis. Neuro: Cranial nerves intact, reflexes equal bilaterally. Normal muscle tone, no cerebellar symptoms. Sensation intact.  Psych: Awake and oriented X 3, normal affect, Insight and Judgment appropriate.    Vicie Mutters 3:38 PM [facility]

## 2020-01-06 ENCOUNTER — Other Ambulatory Visit: Payer: Self-pay

## 2020-01-06 ENCOUNTER — Encounter: Payer: Self-pay | Admitting: Physician Assistant

## 2020-01-06 ENCOUNTER — Ambulatory Visit (INDEPENDENT_AMBULATORY_CARE_PROVIDER_SITE_OTHER): Payer: 59 | Admitting: Physician Assistant

## 2020-01-06 VITALS — BP 128/74 | HR 100 | Temp 97.3°F | Wt 159.0 lb

## 2020-01-06 DIAGNOSIS — E785 Hyperlipidemia, unspecified: Secondary | ICD-10-CM

## 2020-01-06 DIAGNOSIS — E1169 Type 2 diabetes mellitus with other specified complication: Secondary | ICD-10-CM | POA: Diagnosis not present

## 2020-01-06 DIAGNOSIS — G40909 Epilepsy, unspecified, not intractable, without status epilepticus: Secondary | ICD-10-CM

## 2020-01-06 DIAGNOSIS — Z79899 Other long term (current) drug therapy: Secondary | ICD-10-CM | POA: Diagnosis not present

## 2020-01-06 DIAGNOSIS — D509 Iron deficiency anemia, unspecified: Secondary | ICD-10-CM | POA: Diagnosis not present

## 2020-01-06 DIAGNOSIS — E039 Hypothyroidism, unspecified: Secondary | ICD-10-CM | POA: Diagnosis not present

## 2020-01-06 DIAGNOSIS — M2550 Pain in unspecified joint: Secondary | ICD-10-CM

## 2020-01-06 DIAGNOSIS — E559 Vitamin D deficiency, unspecified: Secondary | ICD-10-CM

## 2020-01-06 DIAGNOSIS — J410 Simple chronic bronchitis: Secondary | ICD-10-CM

## 2020-01-06 NOTE — Patient Instructions (Addendum)
INFORMATION ABOUT YOUR XRAY [facility] Can walk into [address] for an Insurance account manager. They will have the order and take you back. You do not any paper work, I should get the result back today or tomorrow. This order is good for a year.  Can call [phone] to schedule an appointment if you wish.   General eating tips  What to Avoid . Avoid added sugars o Often added sugar can be found in processed foods such as many condiments, dry cereals, cakes, cookies, chips, crisps, crackers, candies, sweetened drinks, etc.  o Read labels and AVOID/DECREASE use of foods with the following in their ingredient list: Sugar, fructose, high fructose corn syrup, sucrose, glucose, maltose, dextrose, molasses, cane sugar, brown sugar, any type of syrup, agave nectar, etc.   . Avoid snacking in between meals- drink water or if you feel you need a snack, pick a high water content snack such as cucumbers, watermelon, or any veggie.  Marland Kitchen Avoid foods made with flour o If you are going to eat food made with flour, choose those made with whole-grains; and, minimize your consumption as much as is tolerable . Avoid processed foods o These foods are generally stocked in the middle of the grocery store.  o Focus on shopping on the perimeter of the grocery.  What to Include . Vegetables o GREEN LEAFY VEGETABLES: Kale, spinach, mustard greens, collard greens, cabbage, broccoli, etc. o OTHER: Asparagus, cauliflower, eggplant, carrots, peas, Brussel sprouts, tomatoes, bell peppers, zucchini, beets, cucumbers, etc. . Grains, seeds, and legumes o Beans: kidney beans, black eyed peas, garbanzo beans, black beans, pinto beans, etc. o Whole, unrefined grains: brown rice, barley, bulgur, oatmeal, etc. . Healthy fats  o Avoid highly processed fats such as vegetable oil o Examples of healthy fats: avocado, olives, virgin olive oil, dark chocolate (?72% Cocoa), nuts (peanuts, almonds, walnuts, cashews, pecans,  etc.) o Please still do small amount of these healthy fats, they are dense in calories.  . Low - Moderate Intake of Animal Sources of Protein o Meat sources: chicken, [place], salmon, tuna. Limit to 4 ounces of meat at one time or the size of your palm. o Consider limiting dairy sources, but when choosing dairy focus on: PLAIN [place] yogurt, cottage cheese, high-protein milk . Fruit o Choose berries

## 2020-01-19 ENCOUNTER — Other Ambulatory Visit: Payer: Self-pay | Admitting: Physician Assistant

## 2020-01-19 DIAGNOSIS — R7989 Other specified abnormal findings of blood chemistry: Secondary | ICD-10-CM

## 2020-01-20 LAB — SPECIMEN STATUS REPORT

## 2020-01-23 LAB — VITAMIN D 25 HYDROXY (VIT D DEFICIENCY, FRACTURES): Vit D, 25-Hydroxy: 48.6 ng/mL (ref 30.0–100.0)

## 2020-01-23 LAB — COMPREHENSIVE METABOLIC PANEL
ALT: 76 IU/L — ABNORMAL HIGH (ref 0–32)
AST: 83 IU/L — ABNORMAL HIGH (ref 0–40)
Albumin/Globulin Ratio: 1.8 (ref 1.2–2.2)
Albumin: 4.7 g/dL (ref 3.8–4.8)
Alkaline Phosphatase: 79 IU/L (ref 48–121)
BUN/Creatinine Ratio: 18 (ref 9–23)
BUN: 12 mg/dL (ref 6–24)
Bilirubin Total: 0.5 mg/dL (ref 0.0–1.2)
CO2: 24 mmol/L (ref 20–29)
Calcium: 9.8 mg/dL (ref 8.7–10.2)
Chloride: 100 mmol/L (ref 96–106)
Creatinine, Ser: 0.67 mg/dL (ref 0.57–1.00)
GFR calc Af Amer: 125 mL/min/{1.73_m2} (ref >59)
GFR calc non Af Amer: 108 mL/min/{1.73_m2} (ref >59)
Globulin, Total: 2.6 g/dL (ref 1.5–4.5)
Glucose: 98 mg/dL (ref 65–99)
Potassium: 4.1 mmol/L (ref 3.5–5.2)
Sodium: 138 mmol/L (ref 134–144)
Total Protein: 7.3 g/dL (ref 6.0–8.5)

## 2020-01-23 LAB — LIPID PANEL W/O CHOL/HDL RATIO
Cholesterol, Total: 215 mg/dL — ABNORMAL HIGH (ref 100–199)
HDL: 92 mg/dL (ref >39)
LDL Chol Calc (NIH): 110 mg/dL — ABNORMAL HIGH (ref 0–99)
Triglycerides: 72 mg/dL (ref 0–149)
VLDL Cholesterol Cal: 13 mg/dL (ref 5–40)

## 2020-01-23 LAB — CBC WITH DIFFERENTIAL/PLATELET
Basophils Absolute: 0.1 10*3/uL (ref 0.0–0.2)
Basos: 1 %
EOS (ABSOLUTE): 0.3 10*3/uL (ref 0.0–0.4)
Eos: 4 %
Hematocrit: 41.9 % (ref 34.0–46.6)
Hemoglobin: 14.7 g/dL (ref 11.1–15.9)
Immature Grans (Abs): 0 10*3/uL (ref 0.0–0.1)
Immature Granulocytes: 0 %
Lymphocytes Absolute: 2.8 10*3/uL (ref 0.7–3.1)
Lymphs: 37 %
MCH: 33.9 pg — ABNORMAL HIGH (ref 26.6–33.0)
MCHC: 35.1 g/dL (ref 31.5–35.7)
MCV: 97 fL (ref 79–97)
Monocytes Absolute: 0.6 10*3/uL (ref 0.1–0.9)
Monocytes: 8 %
Neutrophils Absolute: 3.9 10*3/uL (ref 1.4–7.0)
Neutrophils: 50 %
Platelets: 284 10*3/uL (ref 150–450)
RBC: 4.33 x10E6/uL (ref 3.77–5.28)
RDW: 11.8 % (ref 11.7–15.4)
WBC: 7.7 10*3/uL (ref 3.4–10.8)

## 2020-01-23 LAB — ANTI-U1 RNP AB (RDL): Anti-U1 RNP Ab (RDL): <20 Units (ref <20)

## 2020-01-23 LAB — FERRITIN: Ferritin: 74 ng/mL (ref 15–150)

## 2020-01-23 LAB — ZINC: Zinc: 97 ug/dL (ref 44–115)

## 2020-01-23 LAB — RHEUMATOID FACTOR: Rheumatoid fact SerPl-aCnc: <10 IU/mL (ref 0.0–13.9)

## 2020-01-23 LAB — ANA: ANA Titer 1: NEGATIVE

## 2020-01-23 LAB — ANTI-DNA ANTIBODY, DOUBLE-STRANDED: dsDNA Ab: <1 IU/mL (ref 0–9)

## 2020-01-23 LAB — TSH: TSH: 0.145 u[IU]/mL — ABNORMAL LOW (ref 0.450–4.500)

## 2020-01-23 LAB — VITAMIN B12: Vitamin B-12: 1513 pg/mL — ABNORMAL HIGH (ref 232–1245)

## 2020-01-23 LAB — IRON: Iron: 45 ug/dL (ref 27–159)

## 2020-01-23 LAB — HGB A1C W/O EAG: Hgb A1c MFr Bld: 5.2 % (ref 4.8–5.6)

## 2020-01-23 LAB — SEDIMENTATION RATE: Sed Rate: 2 mm/hr (ref 0–32)

## 2020-01-25 ENCOUNTER — Encounter: Payer: 59 | Admitting: Physician Assistant

## 2020-01-25 NOTE — Addendum Note (Signed)
Addended by: Vladimir Crofts on: 01/25/2020 08:34 PM   Modules accepted: Orders

## 2020-01-28 ENCOUNTER — Telehealth: Payer: Self-pay | Admitting: Physician Assistant

## 2020-01-28 DIAGNOSIS — G40909 Epilepsy, unspecified, not intractable, without status epilepticus: Secondary | ICD-10-CM

## 2020-01-28 DIAGNOSIS — Z79899 Other long term (current) drug therapy: Secondary | ICD-10-CM

## 2020-01-28 DIAGNOSIS — R7989 Other specified abnormal findings of blood chemistry: Secondary | ICD-10-CM

## 2020-01-28 NOTE — Telephone Encounter (Signed)
Patient calls to discuss labs and report she thinks she may have had an absence seizure. She states she has not been sleeping as well,she has moved into a new house, helping friend move, and got a puppy. She was at her desk working and was feeling funny, her dad came in and states she was sitting there starring, not responding to him, this lasted seconds and she was fine. She does not recall the situation, no confusion afterwards, no loss of bladder issues. She has been having some mild shaking of her hands and she will take an extra depakote for this.   Her LFTs were elevated last visit. She has 1 drink a night,  No tylenol.   Her thyroid level was also elevated, her anxiety has been worse. She was on biotin, she has been off x 2 days and has decreased her dose to 300mg  daily but 150mg  on Tues and Thurs.   Due to her weight loss and possible seizure will get depakote level, will repeat LFTs and add on ammonia level to rule out encephalopathy. The patient will not drive, she will stay with her parents for a few days.   She was informed to call 911 if she develop any new symptoms such as worsening headaches, episodes of blurred vision, double vision or complete loss of vision or speech difficulties or motor weakness.

## 2020-01-30 LAB — CBC WITH DIFFERENTIAL/PLATELET
Basophils Absolute: 0.1 10*3/uL (ref 0.0–0.2)
Basos: 0 %
EOS (ABSOLUTE): 0.3 10*3/uL (ref 0.0–0.4)
Eos: 2 %
Hematocrit: 44.5 % (ref 34.0–46.6)
Hemoglobin: 14.9 g/dL (ref 11.1–15.9)
Immature Grans (Abs): 0 10*3/uL (ref 0.0–0.1)
Immature Granulocytes: 0 %
Lymphocytes Absolute: 2.1 10*3/uL (ref 0.7–3.1)
Lymphs: 18 %
MCH: 32.8 pg (ref 26.6–33.0)
MCHC: 33.5 g/dL (ref 31.5–35.7)
MCV: 98 fL — ABNORMAL HIGH (ref 79–97)
Monocytes Absolute: 0.7 10*3/uL (ref 0.1–0.9)
Monocytes: 6 %
Neutrophils Absolute: 8.3 10*3/uL — ABNORMAL HIGH (ref 1.4–7.0)
Neutrophils: 74 %
Platelets: 264 10*3/uL (ref 150–450)
RBC: 4.54 x10E6/uL (ref 3.77–5.28)
RDW: 11.7 % (ref 11.7–15.4)
WBC: 11.4 10*3/uL — ABNORMAL HIGH (ref 3.4–10.8)

## 2020-01-30 LAB — HEPATIC FUNCTION PANEL
ALT: 57 IU/L — ABNORMAL HIGH (ref 0–32)
AST: 45 IU/L — ABNORMAL HIGH (ref 0–40)
Albumin: 4.2 g/dL (ref 3.8–4.8)
Alkaline Phosphatase: 73 IU/L (ref 48–121)
Bilirubin Total: 1.1 mg/dL (ref 0.0–1.2)
Bilirubin, Direct: 0.26 mg/dL (ref 0.00–0.40)
Total Protein: 6.7 g/dL (ref 6.0–8.5)

## 2020-01-30 LAB — VALPROIC ACID LEVEL: Valproic Acid Lvl: 33 ug/mL — ABNORMAL LOW (ref 50–100)

## 2020-01-30 LAB — AMMONIA: Ammonia: 49 ug/dL (ref 31–155)

## 2020-01-30 LAB — TSH: TSH: 1.13 u[IU]/mL (ref 0.450–4.500)

## 2020-02-09 ENCOUNTER — Encounter: Payer: 59 | Admitting: Physician Assistant

## 2020-02-10 ENCOUNTER — Ambulatory Visit
Admission: RE | Admit: 2020-02-10 | Discharge: 2020-02-10 | Disposition: A | Discharge status: Home / Self Care | Payer: 59 | Source: Ambulatory Visit | Attending: Physician Assistant | Admitting: Physician Assistant

## 2020-02-10 DIAGNOSIS — J410 Simple chronic bronchitis: Secondary | ICD-10-CM

## 2020-02-10 DIAGNOSIS — R7989 Other specified abnormal findings of blood chemistry: Secondary | ICD-10-CM

## 2020-02-16 ENCOUNTER — Encounter: Payer: 59 | Admitting: Physician Assistant

## 2020-03-10 ENCOUNTER — Telehealth: Payer: Self-pay | Admitting: Physician Assistant

## 2020-03-10 DIAGNOSIS — G40909 Epilepsy, unspecified, not intractable, without status epilepticus: Secondary | ICD-10-CM

## 2020-03-10 MED ORDER — NALTREXONE HCL 50 MG PO TABS: 50.0000 mg | ORAL_TABLET | Freq: Every evening | ORAL | 0 refills | Status: DC

## 2020-03-10 NOTE — Telephone Encounter (Signed)
44 y.o. WF with history of depression, PTSD, DM 2 resolved with weight loss/ozempic, iron def, chol, anxiety, panic attacks and seizure DO calls the office with possible increase in absence seizures recently. She has not had seizures in over 10 years and has not followed with neurology. She has not been responsive while starring at her computer while working from home, some confusion afterwards. She has been more stressed with moving into her new home, she also admits to drinking more alcohol. She is on valium 5 mg for anxiety and valproic acid 250mg , 750mg  BID but admits that she will take an extra if she feels funny and that she will miss doses.   Her last valproic acid level was checked 01/29/20 and was at 33.   Will get labs, refer back to neuro, she has been advised to not drive, she has been advised to not drink, this lowers the seizure threshold, we will send in naltrexone for her to take.   She was informed to call 911 if she develop any new symptoms such as worsening headaches, episodes of blurred vision, double vision or complete loss of vision or speech difficulties or motor weakness.  Medications  Current Outpatient Medications (Endocrine & Metabolic):  .  etonogestrel-ethinyl estradiol (NUVARING) 0.12-0.015 MG/24HR vaginal ring, Insert one per vagina every 21 days for a total of 4 consecutive readings. Take a three-day break and then start with one ring every 21 days for a total of 4 readings again Please note this requires further rings every 3 months .  levothyroxine (SYNTHROID) 300 MCG tablet, TAKE 1 TABLET BY MOUTH DAILY ON AN EMPTY STOMACH WITH ONLY WATER FOR 30 MIN AND NO ANTACIDS, CALCIUM OR MAGNESIUM FOR 4 HOURS AND AVOID BIOTIN .  Semaglutide, 1 MG/DOSE, (OZEMPIC, 1 MG/DOSE,) 2 MG/1.5ML SOPN, Inject 1 mg into the skin once a week.   Current Outpatient Medications (Respiratory):  .  benzonatate (TESSALON) 200 MG capsule, Take 1 capsule (200 mg total) by mouth 3 (three) times  daily as needed for cough (Max: 600mg  per day). (Patient not taking: Reported on 01/06/2020) .  fluticasone (FLONASE) 50 MCG/ACT nasal spray, PLACE 2 SPRAYS INTO BOTH NOSTRILS AT BEDTIME.  Current Outpatient Medications (Analgesics):  Marland Kitchen  SUMAtriptan (IMITREX) 100 MG tablet, Take 1 tablet (100 mg total) by mouth once as needed for migraine. May repeat in 2 hours if headache persists or recurs.   Current Outpatient Medications (Other):  Marland Kitchen  Crisaborole (EUCRISA) 2 % OINT, Apply 1 application topically 2 (two) times daily. (Patient not taking: Reported on 01/06/2020) .  cyclobenzaprine (FLEXERIL) 10 MG tablet, Take 1 tablet at hour of Sleep .  diazepam (VALIUM) 5 MG tablet, 1/2 to 1 daily as needed for severe anxiety .  omeprazole (PRILOSEC) 20 MG capsule,  .  traZODone (DESYREL) 50 MG tablet, Take 1 tablet at Bedtime for Sleep .  valACYclovir (VALTREX) 1000 MG tablet, Take 1 tablet (1,000 mg total) by mouth 2 (two) times daily. For 10 days.  Start within 72 hours of symptom onset. .  valproic acid (DEPAKENE) 250 MG capsule, Take 2 capsules 3 x /day for Mood .  Vitamin D, Ergocalciferol, (DRISDOL) 1.25 MG (50000 UNIT) CAPS capsule, 1 pill 3 days a week for severe vitamin d deficiency for 8 weeks, then once a week  Problem list She has Depression; PTSD (post-traumatic stress disorder); Obesity surgery status; Seizure disorder ([place]); Type 2 diabetes mellitus with hyperlipidemia ([place]); Hypothyroidism; Panic attacks; Dissociative identity disorder [facility]); Anxiety; Obesity; Vitamin  D deficiency; Hyperlipidemia LDL goal <70; Medication management; and Iron deficiency anemia on their problem list.   .Recent Visits Date Type Provider Dept  01/06/20 Office Visit Vicie Mutters, [place]  09/03/19 Office Visit Vicie Mutters, [place]  05/28/19 Office Visit Vicie Mutters, PA-C [facility]  02/12/19 Office Visit Vicie Mutters, PA-C [place] recent visits within past 540 days with a meds authorizing provider and meeting all other requirements Future Appointments Date Type Provider Dept  03/16/20 Appointment Vicie Mutters, PA-C [facility]  04/12/20 Appointment Garnet Sierras, NP [place] future appointments within next 150 days with a meds authorizing provider and meeting all other requirements

## 2020-03-14 ENCOUNTER — Ambulatory Visit: Payer: 59 | Admitting: Physician Assistant

## 2020-03-15 DIAGNOSIS — J439 Emphysema, unspecified: Secondary | ICD-10-CM | POA: Insufficient documentation

## 2020-03-15 NOTE — Progress Notes (Signed)
CPE Assessment and Plan: Type 2 diabetes mellitus with hyperlipidemia ([place]) Off meds with weight loss, will restart ozemipic if needed  Hypothyroidism, unspecified type Hypothyroidism-check TSH level, continue medications the same, reminded to take on an empty stomach 30-45mins before food.  With weight loss may need to cut back on medication  Seizure disorder ([place]) Continue medications- may need increase Will check valproic acid level Stop ETOH- on neltrexone ? Lowered seizure threshold with ETOH/lack of sleep Will refer to neuro for follow up  Recurrent major depressive disorder, in partial remission ([place]) Continue to see psych  Medication management  Obesity surgery status At goal weight, continue to increase exercise  Vitamin D deficiency Continue supplement  AB pain Check pancreatic enzymes Some sludge on AB [place] with liver shadowing, declines CT at this time ? Need HIDA Check labs Please go to the ER if you have any severe AB pain, unable to hold down food/water, blood in stool or vomit, chest pain, shortness of breath, or any worsening symptoms.   COPD Via CXR Will send in chantix for patient to start  Discussed med's effects and SE's. Screening labs and tests as requested with regular follow-up as recommended. LAB DONE AT [facility], HAS RX FOR LABS AND WILL GET DONE Future Appointments  Date Time Provider [place]  04/12/2020  3:00 PM Garnet Sierras, NP [facility] None  05/17/2020  4:00 PM Penumalli, Earlean Polka, MD [facility] None  03/13/2021 10:00 AM McClanahan, Danton Sewer, NP [facility] None   zz HPI  44 y.o. female  presents for  follow up for chol, DM, seizures, anemia.    Her blood pressure has been controlled at home, today their BP is BP: 116/74 She does workout she is walking more due to new puppy, lily.  She denies chest pain, shortness of breath, dizziness.  She had a negative rheumatoid work up  She has been having more nausea/vomiting and heart  burn, ie with pot pie, pizza, ribs and due to elevated labs she had an AB [place] that showed sludge without evidence of cholecysitis, and shadowing of right lobe of the liver that they recommended CT AB pelvis- patient declines at this time.  IMPRESSION: 1. Markedly limited study secondary to overlying bowel gas. 2. Moderate amount of gallbladder sludge without evidence of cholelithiasis or acute cholecystitis. 3. Shadowing echogenic focus within the right lobe of the liver which may represent an area of parenchymal calcification. Correlation with follow-up nonemergent abdomen pelvis CT is recommended.  She has had epilepsy since she was 89, followed with Dr. Minna Antis in the past, on valproic acid 250mg  takes 750mg  BID, had a trial off valproic acid but had seizures, she had not had a seizure in over a decade until recently. She reports 1 episode of possible absence seizure and 1 episode of possible tonic-clonic seizure. She states she has had extra stress with moving into a new house, work and admits to drinking too much alcohol, both episodes were after heavy drinking the night before  She was advised to stop drinking, given naltrexone to help, has never had DTs in the past.  She has been advised to not drive long distances and follow up with neuro for clearance, her medication will be increased but it is possible these are provoked from ETOH/lack of sleep lowering seizure threshold. She lives very close to her parents and has discussed wanting to drive to her parents house. Since not drinking she is feeling much better, no more seizures.   She has  a productive cough, mostly in the morning. She is still smoking.  CXR showed Mild hyperinflation without acute finding.  She is 6 years s/p gastric bypass, she was 260 before the surgery and got down to 167.  She is now at 156 lbs which was lower than her surgery.  BMI is Body mass index is 23.72 kg/m., she is working on diet and exercise. Wt Readings from  Last 3 Encounters:  03/16/20 156 lb (70.8 kg)  01/06/20 159 lb (72.1 kg)  09/03/19 178 lb (80.7 kg)    She is not on cholesterol medication and denies myalgias. Her cholesterol is at goal. The cholesterol last visit was:   Lab Results  Component Value Date   CHOL 215 (H) 01/19/2020   HDL 92 01/19/2020   LDLCALC 110 (H) 01/19/2020   TRIG 72 01/19/2020    She has been working on diet and exercise for diabetes  with hyperlipidemia not on medications Without CKD She was on ozempic 1 mg once a week but with weight loss we are doing a trial off but will restart if she needs to No CAD, CHF, or PVD denies paresthesia of the feet, polydipsia, polyuria and visual disturbances.  Last A1C in the office was:  Lab Results  Component Value Date   HGBA1C 5.2 01/19/2020     Lab Results  Component Value Date   GFRNONAA 108 01/19/2020   Patient is on Vitamin D supplement.   Lab Results  Component Value Date   VD25OH 48.6 01/19/2020     She is on thyroid medication. Her medication was not changed last visit.   Lab Results  Component Value Date   TSH 1.130 01/29/2020  .   She is on trazodone for sleep, takes PRN, uses once a month.  She is on the valium PRN as well for anxiety.   She is on the [place] ring and is on it continuously. She has a history of miscarriages and infertility HOWEVER has seen her GYN.   Due to her gastric bypass she has has severe symptomatic iron def anemia, she has had an iron transfusion in the past and remains on iron at this time and is feeling better.  Lab Results  Component Value Date   IRON 45 01/19/2020   TIBC 393 03/21/2018   FERRITIN 74 01/19/2020     Current Outpatient Medications (Endocrine & Metabolic):  .  etonogestrel-ethinyl estradiol (NUVARING) 0.12-0.015 MG/24HR vaginal ring, Insert one per vagina every 21 days for a total of 4 consecutive readings. Take a three-day break and then start with one ring every 21 days for a total of 4 readings again  Please note this requires further rings every 3 months .  levothyroxine (SYNTHROID) 300 MCG tablet, TAKE 1 TABLET BY MOUTH DAILY ON AN EMPTY STOMACH WITH ONLY WATER FOR 30 MIN AND NO ANTACIDS, CALCIUM OR MAGNESIUM FOR 4 HOURS AND AVOID BIOTIN   Current Outpatient Medications (Respiratory):  .  fluticasone (FLONASE) 50 MCG/ACT nasal spray, PLACE 2 SPRAYS INTO BOTH NOSTRILS AT BEDTIME.  Current Outpatient Medications (Analgesics):  Marland Kitchen  SUMAtriptan (IMITREX) 100 MG tablet, Take 1 tablet (100 mg total) by mouth once as needed for migraine. May repeat in 2 hours if headache persists or recurs.   Current Outpatient Medications (Other):  .  cyclobenzaprine (FLEXERIL) 10 MG tablet, Take 1 tablet at hour of Sleep .  diazepam (VALIUM) 5 MG tablet, 1/2 to 1 daily as needed for severe anxiety .  naltrexone (DEPADE) 50  MG tablet, Take 1 tablet (50 mg total) by mouth at bedtime. Marland Kitchen  omeprazole (PRILOSEC) 20 MG capsule,  .  traZODone (DESYREL) 50 MG tablet, Take 1 tablet at Bedtime for Sleep .  valACYclovir (VALTREX) 1000 MG tablet, Take 1 tablet (1,000 mg total) by mouth 2 (two) times daily. For 10 days.  Start within 72 hours of symptom onset. .  valproic acid (DEPAKENE) 250 MG capsule, Take 2 capsules 3 x /day for Mood .  Vitamin D, Ergocalciferol, (DRISDOL) 1.25 MG (50000 UNIT) CAPS capsule, 1 pill 3 days a week for severe vitamin d deficiency for 8 weeks, then once a week  Medical History:  Past Medical History:  Diagnosis Date  . Abdominal cramping 08/10/2011  . Anxiety   . Asthma   . Depression   . Habitual abortion history, antepartum   . Headache(784.0)   . History of diabetes mellitus    prior to gastric bypass in 2012  . History of recurrent miscarriages, not currently pregnant 10/11/2011  . Hypothyroidism   . Infertility associated with anovulation   . Obesity    BMi 33  . Seizures ([place])   . UTI (lower urinary tract infection) 08/14/2011   Allergies Allergies  Allergen Reactions  .  Codeine Itching, Rash and Other (See Comments)    OPIOIDS - MORPHINE & RELATED   Immunization History  Administered Date(s) Administered  . Influenza, Seasonal, Injecte, Preservative Fre 04/30/2016  . Influenza,inj,Quad PF,6+ Mos 05/24/2017, 04/10/2018  . Influenza,inj,quad, With Preservative 04/26/2015  . PFIZER SARS-COV-2 Vaccination 09/24/2019, 10/21/2019  . Pneumococcal Polysaccharide-23 02/12/2019  . Tdap 07/16/2017   Health Maintenance  Topic Date Due  . Hepatitis C Screening  Never done  . FOOT EXAM  Never done  . OPHTHALMOLOGY EXAM  Never done  . URINE MICROALBUMIN  Never done  . HIV Screening  Never done  . PAP SMEAR-Modifier  01/09/2020  . INFLUENZA VACCINE  01/24/2020  . HEMOGLOBIN A1C  07/21/2020  . TETANUS/TDAP  07/17/2027  . PNEUMOCOCCAL POLYSACCHARIDE VACCINE AGE 10-64 HIGH RISK  Completed  . COVID-19 Vaccine  Completed     SURGICAL HISTORY She  has a past surgical history that includes Gastric bypass (2012) and Dilation and curettage of uterus. FAMILY HISTORY Her family history includes Alcohol abuse in her maternal uncle; Alzheimer's disease in her maternal grandfather, maternal grandmother, and paternal grandmother; Anorexia nervosa in her sister; Anxiety disorder in her maternal uncle; Depression in her maternal grandmother, maternal uncle, and mother; Diabetes in her maternal grandmother and mother. SOCIAL HISTORY She  reports that she quit smoking about 20 years ago. Her smoking use included cigarettes. She has never used smokeless tobacco. She reports current alcohol use of about 2.0 standard drinks of alcohol per week. She reports that she does not use drugs.  Review of Systems: Review of Systems  Constitutional: Negative for chills, diaphoresis, fever, malaise/fatigue and weight loss.  HENT: Negative.   Respiratory: Negative.   Cardiovascular: Negative.   Gastrointestinal: Negative for abdominal pain, blood in stool, constipation, diarrhea, heartburn,  melena, nausea and vomiting.  Genitourinary: Negative.   Musculoskeletal: Negative.   Skin: Negative.   Neurological: Negative.  Negative for weakness.  Psychiatric/Behavioral: Negative for depression, hallucinations, memory loss, substance abuse and suicidal ideas. The patient is not nervous/anxious and does not have insomnia.     Physical Exam: Estimated body mass index is 23.72 kg/m as calculated from the following:   Height as of this encounter: 5\' 8"  (1.727 m).   Weight  as of this encounter: 156 lb (70.8 kg). BP 116/74   Pulse (!) 103   Temp (!) 97.3 F (36.3 C)   Ht 5\' 8"  (1.727 m)   Wt 156 lb (70.8 kg)   SpO2 99%   BMI 23.72 kg/m  General Appearance: Well nourished, in no apparent distress.  Eyes: PERRLA, EOMs, conjunctiva no swelling or erythema, normal fundi and vessels.  Sinuses: No Frontal/maxillary tenderness  ENT/Mouth: Ext aud canals clear, normal light reflex with TMs without erythema, bulging. Good dentition. No erythema, swelling, or exudate on post pharynx. Tonsils not swollen or erythematous. Hearing normal. Crowded mouth.  Neck: Supple, thyroid normal. No bruits  Respiratory: Respiratory effort normal, BS equal bilaterally without rales, rhonchi, wheezing or stridor.  Cardio: RRR without murmurs, rubs or gallops. Prominent S2. Brisk peripheral pulses without edema.  Chest: symmetric, with normal excursions and percussion.  Abdomen: Soft, nontender, no guarding, rebound, hernias, masses, or organomegaly. .  Lymphatics: Non tender without lymphadenopathy.  Musculoskeletal: Full ROM all peripheral extremities,5/5 strength, and normal gait.  Skin: Warm, dry without rashes, lesions, ecchymosis. Neuro: Cranial nerves intact, reflexes equal bilaterally. Normal muscle tone, no cerebellar symptoms. Sensation intact.  Psych: Awake and oriented X 3, normal affect, Insight and Judgment appropriate.    Vicie Mutters 10:47 AM [facility]

## 2020-03-16 ENCOUNTER — Ambulatory Visit: Payer: 59 | Admitting: Physician Assistant

## 2020-03-16 ENCOUNTER — Telehealth: Payer: Self-pay | Admitting: Physician Assistant

## 2020-03-16 ENCOUNTER — Encounter: Payer: Self-pay | Admitting: Physician Assistant

## 2020-03-16 ENCOUNTER — Other Ambulatory Visit: Payer: Self-pay

## 2020-03-16 VITALS — BP 116/74 | HR 103 | Temp 97.3°F | Ht 68.0 in | Wt 156.0 lb

## 2020-03-16 DIAGNOSIS — Z0001 Encounter for general adult medical examination with abnormal findings: Secondary | ICD-10-CM

## 2020-03-16 DIAGNOSIS — F419 Anxiety disorder, unspecified: Secondary | ICD-10-CM

## 2020-03-16 DIAGNOSIS — G40909 Epilepsy, unspecified, not intractable, without status epilepticus: Secondary | ICD-10-CM

## 2020-03-16 DIAGNOSIS — F3341 Major depressive disorder, recurrent, in partial remission: Secondary | ICD-10-CM

## 2020-03-16 DIAGNOSIS — J439 Emphysema, unspecified: Secondary | ICD-10-CM

## 2020-03-16 DIAGNOSIS — F4310 Post-traumatic stress disorder, unspecified: Secondary | ICD-10-CM

## 2020-03-16 DIAGNOSIS — F410 Panic disorder [episodic paroxysmal anxiety] without agoraphobia: Secondary | ICD-10-CM

## 2020-03-16 DIAGNOSIS — Z0000 Encounter for general adult medical examination without abnormal findings: Secondary | ICD-10-CM

## 2020-03-16 DIAGNOSIS — Z9884 Bariatric surgery status: Secondary | ICD-10-CM

## 2020-03-16 DIAGNOSIS — E1169 Type 2 diabetes mellitus with other specified complication: Secondary | ICD-10-CM

## 2020-03-16 DIAGNOSIS — E039 Hypothyroidism, unspecified: Secondary | ICD-10-CM

## 2020-03-16 DIAGNOSIS — E559 Vitamin D deficiency, unspecified: Secondary | ICD-10-CM

## 2020-03-16 DIAGNOSIS — D509 Iron deficiency anemia, unspecified: Secondary | ICD-10-CM

## 2020-03-16 DIAGNOSIS — R197 Diarrhea, unspecified: Secondary | ICD-10-CM

## 2020-03-16 DIAGNOSIS — Z79899 Other long term (current) drug therapy: Secondary | ICD-10-CM

## 2020-03-16 DIAGNOSIS — E785 Hyperlipidemia, unspecified: Secondary | ICD-10-CM

## 2020-03-16 DIAGNOSIS — F4481 Dissociative identity disorder: Secondary | ICD-10-CM

## 2020-03-16 DIAGNOSIS — R232 Flushing: Secondary | ICD-10-CM

## 2020-03-16 MED ORDER — VARENICLINE TARTRATE 1 MG PO TABS: 1.0000 mg | ORAL_TABLET | Freq: Two times a day (BID) | ORAL | 2 refills | Status: DC

## 2020-03-16 NOTE — Telephone Encounter (Signed)
Was sent in for patient while she was here. Sent my chart message

## 2020-03-21 LAB — HM PAP SMEAR: HM Pap smear: NORMAL

## 2020-03-23 ENCOUNTER — Other Ambulatory Visit: Payer: Self-pay | Admitting: Obstetrics & Gynecology

## 2020-04-01 ENCOUNTER — Encounter: Payer: Self-pay | Admitting: Internal Medicine

## 2020-04-12 ENCOUNTER — Telehealth: Payer: Self-pay

## 2020-04-12 ENCOUNTER — Encounter: Payer: 59 | Admitting: Adult Health Nurse Practitioner

## 2020-04-12 NOTE — Telephone Encounter (Signed)
Please send in new prescription for either Varenicline Tartrate Tier 1 or Chantix starting month pak, tier 3, QL.  Insurance won't cover the continuing pak.

## 2020-04-13 ENCOUNTER — Other Ambulatory Visit: Payer: Self-pay | Admitting: Adult Health

## 2020-04-13 MED ORDER — CHANTIX STARTING MONTH PAK 0.5 MG X 11 & 1 MG X 42 PO TABS: ORAL_TABLET | ORAL | 0 refills | Status: DC

## 2020-05-09 ENCOUNTER — Encounter: Payer: Self-pay | Admitting: Diagnostic Neuroimaging

## 2020-05-09 ENCOUNTER — Encounter: Payer: Self-pay | Admitting: Neurology

## 2020-05-17 ENCOUNTER — Ambulatory Visit: Payer: Self-pay | Admitting: Diagnostic Neuroimaging

## 2020-05-31 ENCOUNTER — Ambulatory Visit: Payer: Self-pay | Admitting: Diagnostic Neuroimaging

## 2020-06-25 HISTORY — PX: CLAVICLE SURGERY: SHX598

## 2020-06-28 ENCOUNTER — Ambulatory Visit: Payer: 59 | Admitting: Adult Health Nurse Practitioner

## 2020-06-29 ENCOUNTER — Ambulatory Visit: Payer: 59 | Admitting: Adult Health

## 2020-07-04 NOTE — Progress Notes (Signed)
3 FOLLOW UP  Assessment and Plan:  Type 2 diabetes mellitus with hyperlipidemia (HCC) Restart ozempic taper, restart at 0.25 mg x 4 weeks, then 0.5 mg weekly   Hypothyroidism, unspecified type Hypothyroidism-check TSH level, continue medications the same, reminded to take on an empty stomach 30-34mins before food.   Seizure disorder ([place]) Continue medications, neuro following  Recurrent major depressive disorder, in partial remission ([place]) Continue to see psych  Medication management  Obesity surgery status Continue weight loss continue to increase exercise  Restart phentermine PRN during menstrual cycles only   Vitamin D deficiency Continue supplement  Discussed med's effects and SE's. Screening labs and tests as requested with regular follow-up as recommended. LAB DONE AT [facility], HAS RX FOR LABS AND WILL GET DONE Future Appointments  Date Time Provider [place]  08/09/2020  3:00 PM Penumalli, Earlean Polka, MD [facility] None  03/13/2021 10:00 AM McClanahan, Danton Sewer, NP [facility] None   zz HPI  45 y.o. female  presents for  follow up for chol, DM, seizures, anemia.    She reports menstrual cycles stopped for 6 months after second shot, GYN gave progesterone which did restart. Now resumed nuvaring.   Has severe appetite fluctuations, takes phentermine PRN during menstrual which works well. Would like to restart.   Has lifestyle controlled reflux, rare breakthrough.  She had bilateral hand pain, worse PIP/DIP, painful to make a fist, does have a family history of RA but had negative RA workup. No feet pain, no rash. Worse when she works a lot with hands. Managing with aleve PRN, but is taking daily.   BMI is Body mass index is 25.09 kg/m., she has been working on diet and exercise but admits stressed and could do better, renovating parent's house. Has new puppy, planning to start walking.  Wt Readings from Last 3 Encounters:  07/05/20 165 lb (74.8 kg)  03/16/20 156 lb  (70.8 kg)  01/06/20 159 lb (72.1 kg)   Her blood pressure has been controlled at home, today their BP is BP: 130/82 She does workout she is walking more due to new puppy, lily.  She denies chest pain, shortness of breath, dizziness.    She has a productive cough, mostly in the morning. Has not had CXR.    She is not on cholesterol medication and denies myalgias. Her cholesterol is at goal. The cholesterol last visit was:   Lab Results  Component Value Date   CHOL 215 (H) 01/19/2020   HDL 92 01/19/2020   LDLCALC 110 (H) 01/19/2020   TRIG 72 01/19/2020    She has been working on diet and exercise for diabetes  with hyperlipidemia not on medications due to well controlled A1C Without CKD She was on ozempic 1 mg once a week, has been off but would like to restart, felt she tolerated 1 mg dose the best.  No CAD, CHF, or PVD denies paresthesia of the feet, polydipsia, polyuria and visual disturbances.  Last A1C in the office was:  Lab Results  Component Value Date   HGBA1C 5.2 01/19/2020   Lab Results  Component Value Date   GFRNONAA 108 01/19/2020   Patient is on Vitamin D supplement.   Lab Results  Component Value Date   VD25OH 48.6 01/19/2020     She is on thyroid medication. Her medication was not changed last visit. Admits has been off for 3 weeks, just restarted a few days ago.  Lab Results  Component Value Date   TSH 1.130  01/29/2020   Due to her gastric bypass she has has severe symptomatic iron def anemia, has PRN iron infusions.   Lab Results  Component Value Date   IRON 45 01/19/2020   TIBC 393 03/21/2018   FERRITIN 74 01/19/2020   She has had epilepsy since she was 14, on valproic acid 250mg , has not had a seizure in a decade, had a trial off but had seizures. Should remain on medication. Follows with Dr. Juline Patch. He checks levels.   She is 6 years s/p gastric bypass, she was 260 before the surgery and got down to 167.  BMI is Body mass index is 25.09  kg/m., Wt Readings from Last 3 Encounters:  07/05/20 165 lb (74.8 kg)  03/16/20 156 lb (70.8 kg)  01/06/20 159 lb (72.1 kg)    Current Outpatient Medications on File Prior to Visit  Medication Sig   diazepam (VALIUM) 5 MG tablet 1/2 to 1 daily as needed for severe anxiety   etonogestrel-ethinyl estradiol (NUVARING) 0.12-0.015 MG/24HR vaginal ring Insert one per vagina every 21 days for a total of 4 consecutive readings. Take a three-day break and then start with one ring every 21 days for a total of 4 readings again Please note this requires further rings every 3 months   fluticasone (FLONASE) 50 MCG/ACT nasal spray PLACE 2 SPRAYS INTO BOTH NOSTRILS AT BEDTIME.   levothyroxine (SYNTHROID) 300 MCG tablet TAKE 1 TABLET BY MOUTH DAILY ON AN EMPTY STOMACH WITH ONLY WATER FOR 30 MIN AND NO ANTACIDS, CALCIUM OR MAGNESIUM FOR 4 HOURS AND AVOID BIOTIN   omeprazole (PRILOSEC) 20 MG capsule    SUMAtriptan (IMITREX) 100 MG tablet Take 1 tablet (100 mg total) by mouth once as needed for migraine. May repeat in 2 hours if headache persists or recurs.   traZODone (DESYREL) 50 MG tablet Take 1 tablet at Bedtime for Sleep   valACYclovir (VALTREX) 1000 MG tablet Take 1 tablet (1,000 mg total) by mouth 2 (two) times daily. For 10 days.  Start within 72 hours of symptom onset.   valproic acid (DEPAKENE) 250 MG capsule Take 2 capsules 3 x /day for Mood   Vitamin D, Ergocalciferol, (DRISDOL) 1.25 MG (50000 UNIT) CAPS capsule 1 pill 3 days a week for severe vitamin d deficiency for 8 weeks, then once a week   cyclobenzaprine (FLEXERIL) 10 MG tablet Take 1 tablet at hour of Sleep (Patient not taking: Reported on 07/05/2020)   naltrexone (DEPADE) 50 MG tablet Take 1 tablet (50 mg total) by mouth at bedtime. (Patient not taking: Reported on 07/05/2020)   varenicline (CHANTIX STARTING MONTH PAK) 0.5 MG X 11 & 1 MG X 42 tablet Take one 0.5 mg tablet by mouth once daily for 3 days, then increase to one 0.5 mg  tablet twice daily for 4 days, then increase to one 1 mg tablet twice daily. (Patient not taking: Reported on 07/05/2020)   No current facility-administered medications on file prior to visit.   Medical History:  Past Medical History:  Diagnosis Date   Abdominal cramping 08/10/2011   Anxiety    Asthma    Depression    Habitual abortion history, antepartum    Headache(784.0)    History of diabetes mellitus    prior to gastric bypass in 2012   History of recurrent miscarriages, not currently pregnant 10/11/2011   Hypothyroidism    Infertility associated with anovulation    Obesity    BMi 33   Seizures (HCC)  UTI (lower urinary tract infection) 08/14/2011   Allergies Allergies  Allergen Reactions   Codeine Itching, Rash and Other (See Comments)    OPIOIDS - MORPHINE & RELATED    SURGICAL HISTORY She  has a past surgical history that includes Gastric bypass (2012) and Dilation and curettage of uterus. FAMILY HISTORY Her family history includes Alcohol abuse in her maternal uncle; Alzheimer's disease in her maternal grandfather, maternal grandmother, and paternal grandmother; Anorexia nervosa in her sister; Anxiety disorder in her maternal uncle; Depression in her maternal grandmother, maternal uncle, and mother; Diabetes in her maternal grandmother and mother. SOCIAL HISTORY She  reports that she quit smoking about 20 years ago. Her smoking use included cigarettes. She has never used smokeless tobacco. She reports current alcohol use of about 2.0 standard drinks of alcohol per week. She reports that she does not use drugs.   Review of Systems: Review of Systems  Constitutional: Negative for chills, diaphoresis, fever, malaise/fatigue and weight loss.  HENT: Negative.   Respiratory: Negative.   Cardiovascular: Negative.   Gastrointestinal: Negative for abdominal pain, blood in stool, constipation, diarrhea, heartburn, melena, nausea and vomiting.  Genitourinary:  Negative.   Musculoskeletal: Negative.   Skin: Negative.   Neurological: Negative.  Negative for weakness.  Psychiatric/Behavioral: Negative for depression, hallucinations, memory loss, substance abuse and suicidal ideas. The patient is not nervous/anxious and does not have insomnia.     Physical Exam: Estimated body mass index is 25.09 kg/m as calculated from the following:   Height as of 03/16/20: 5\' 8"  (1.727 m).   Weight as of this encounter: 165 lb (74.8 kg). BP 130/82    Pulse 98    Temp (!) 97.5 F (36.4 C)    Wt 165 lb (74.8 kg)    SpO2 99%    BMI 25.09 kg/m  General Appearance: Well nourished, in no apparent distress.  Eyes: PERRLA, EOMs, conjunctiva no swelling or erythema Sinuses: No Frontal/maxillary tenderness  ENT/Mouth: Ext aud canals clear, normal light reflex with TMs without erythema, bulging. Good dentition. No erythema, swelling, or exudate on post pharynx. Tonsils not swollen or erythematous. Hearing normal. Crowded mouth.  Neck: Supple, thyroid normal. No bruits  Respiratory: Respiratory effort normal, BS equal bilaterally without rales, rhonchi, wheezing or stridor.  Cardio: RRR without murmurs, rubs or gallops. Prominent S2. Brisk peripheral pulses without edema.  Chest: symmetric, with normal excursions and percussion.  Abdomen: Soft, nontender, no guarding, rebound, hernias, masses, or organomegaly. .  Lymphatics: Non tender without lymphadenopathy.  Musculoskeletal: Full ROM all peripheral extremities,5/5 strength, and normal gait.  Skin: Warm, dry without rashes, lesions, ecchymosis. Neuro: Cranial nerves intact, reflexes equal bilaterally. Normal muscle tone, no cerebellar symptoms. Sensation intact.  Psych: Awake and oriented X 3, normal affect, Insight and Judgment appropriate.    Gorden Harms Garret Teale 11:51 AM [facility]

## 2020-07-05 ENCOUNTER — Other Ambulatory Visit: Payer: Self-pay

## 2020-07-05 ENCOUNTER — Encounter: Payer: Self-pay | Admitting: Adult Health

## 2020-07-05 ENCOUNTER — Ambulatory Visit: Payer: 59 | Admitting: Adult Health

## 2020-07-05 VITALS — BP 130/82 | HR 98 | Temp 97.5°F | Wt 165.0 lb

## 2020-07-05 DIAGNOSIS — E66811 Obesity, class 1: Secondary | ICD-10-CM

## 2020-07-05 DIAGNOSIS — E559 Vitamin D deficiency, unspecified: Secondary | ICD-10-CM | POA: Diagnosis not present

## 2020-07-05 DIAGNOSIS — J439 Emphysema, unspecified: Secondary | ICD-10-CM

## 2020-07-05 DIAGNOSIS — E1169 Type 2 diabetes mellitus with other specified complication: Secondary | ICD-10-CM | POA: Diagnosis not present

## 2020-07-05 DIAGNOSIS — F4481 Dissociative identity disorder: Secondary | ICD-10-CM

## 2020-07-05 DIAGNOSIS — F419 Anxiety disorder, unspecified: Secondary | ICD-10-CM

## 2020-07-05 DIAGNOSIS — G40909 Epilepsy, unspecified, not intractable, without status epilepticus: Secondary | ICD-10-CM

## 2020-07-05 DIAGNOSIS — E669 Obesity, unspecified: Secondary | ICD-10-CM

## 2020-07-05 DIAGNOSIS — E785 Hyperlipidemia, unspecified: Secondary | ICD-10-CM

## 2020-07-05 DIAGNOSIS — Z79899 Other long term (current) drug therapy: Secondary | ICD-10-CM

## 2020-07-05 DIAGNOSIS — F3341 Major depressive disorder, recurrent, in partial remission: Secondary | ICD-10-CM

## 2020-07-05 DIAGNOSIS — D509 Iron deficiency anemia, unspecified: Secondary | ICD-10-CM

## 2020-07-05 DIAGNOSIS — E039 Hypothyroidism, unspecified: Secondary | ICD-10-CM

## 2020-07-05 DIAGNOSIS — F4310 Post-traumatic stress disorder, unspecified: Secondary | ICD-10-CM

## 2020-07-05 MED ORDER — OZEMPIC (0.25 OR 0.5 MG/DOSE) 2 MG/1.5ML ~~LOC~~ SOPN: PEN_INJECTOR | SUBCUTANEOUS | 0 refills | Status: DC

## 2020-07-05 MED ORDER — PHENTERMINE HCL 37.5 MG PO TABS: ORAL_TABLET | ORAL | 2 refills | Status: DC

## 2020-07-05 NOTE — Patient Instructions (Addendum)
Try voltaren - aspercreme - can do 3-4 times a day, can wear gloves on top to help    Diclofenac Topical Skin Gel What is this medicine? DICLOFENAC (dye KLOE fen ak) is a non-steroidal anti-inflammatory drug, also known as an NSAID. It is used to treat pain, inflammation, and swelling. This medicine may be used for other purposes; ask your health care provider or pharmacist if you have questions. COMMON BRAND NAME(S): DSG Pak, Omeca, Solaravix, Solaraze, ValcoPrep-100, VennGel One, Voltaren Arthritis, Voltaren Gel What should I tell my health care provider before I take this medicine? They need to know if you have any of these conditions:  bleeding disorders  coronary artery bypass graft (CABG) within the past 2 weeks  if you often drink alcohol  heart attack  heart disease  heart failure  high blood pressure  kidney disease  large area of burned or damaged skin  liver disease  low red blood cell counts  lung or breathing disease (asthma)  receiving steroids like dexamethasone or prednisone  smoke cigarettes  skin conditions or sensitivity  stomach bleeding  stomach or intestine problems  take drugs that treat or prevent blood clots  an unusual or allergic reaction to diclofenac, other medicines, foods, dyes, or preservatives  pregnant or trying to get pregnant  breast-feeding How should I use this medicine? This medicine is for external use only. Do not take by mouth. Wash your hands before and after use. If you are treating your hands, only wash your hands before use. Do not get it in your eyes. If you do, rinse your eyes with plenty of cool tap water. Use it as directed on the prescription label at the same time every day. Do not use it more often than directed. Keep taking it unless your health care provider tells you to stop. Apply a thin film of the drug to the affected area. If you are using Voltaren or Venngel One, they come with INSTRUCTIONS FOR  USE. Ask your pharmacist for directions on how to use this drug. Read the information carefully. Talk to your pharmacist or health care provider if you have questions. A special MedGuide will be given to you by the pharmacist with each prescription and refill. Be sure to read this information carefully each time. Talk to your health care provider about the use of this drug in children. Special care may be needed. Overdosage: If you think you have taken too much of this medicine contact a poison control center or emergency room at once. NOTE: This medicine is only for you. Do not share this medicine with others. What if I miss a dose? If you are using Voltaren or Venngel One: If you miss a dose, skip it. Use your next dose at the normal time. Do not use extra or 2 doses at the same time to make up for the missed dose. If you are using Solaraze: If you miss a dose, use it as soon as you can. If it is almost time for your next dose, use only that dose. Do not use double or extra doses. What may interact with this medicine?  aspirin  NSAIDs, medicines for pain and inflammation, like ibuprofen or naproxen Do not use any other skin products without telling your doctor or health care professional. This list may not describe all possible interactions. Give your health care provider a list of all the medicines, herbs, non-prescription drugs, or dietary supplements you use. Also tell them if you  smoke, drink alcohol, or use illegal drugs. Some items may interact with your medicine. What should I watch for while using this medicine? Visit your health care provider for regular checks on your progress. Tell your health care provider if your symptoms do not start to get better or if they get worse. Do not take other medicines that contain aspirin, ibuprofen, or naproxen with this medicine. Side effects such as stomach upset, nausea, or ulcers may be more likely to occur. Many non-prescription medicines contain  aspirin, ibuprofen, or naproxen. Always read labels carefully. This medicine can cause serious ulcers and bleeding in the stomach. It can happen with no warning. Smoking, drinking alcohol, older age, and poor health can also increase risks. Call your health care provider right away if you have stomach pain or blood in your vomit or stool. This medicine does not prevent a heart attack or stroke. This medicine may increase the chance of a heart attack or stroke. The chance may increase the longer you use this medicine or if you have heart disease. If you take aspirin to prevent a heart attack or stroke, talk to your health care provider about using this medicine. Alcohol may interfere with the effect of this medicine. Avoid alcoholic drinks. This medicine may cause serious skin reactions. They can happen weeks to months after starting the medicine. Contact your health care provider right away if you notice fevers or flu-like symptoms with a rash. The rash may be red or purple and then turn into blisters or peeling of the skin. Or, you might notice a red rash with swelling of the face, lips or lymph nodes in your neck or under your arms. Talk to your health care provider if you are pregnant before taking this medicine. Taking this medicine between weeks 20 and 30 of pregnancy may harm your unborn baby. Your health care provider will monitor you closely if you need to take it. After 30 weeks of pregnancy, do not take this medicine. You may get drowsy or dizzy. Do not drive, use machinery, or do anything that needs mental alertness until you know how this medicine affects you. Do not stand up or sit up quickly, especially if you are an older patient. This reduces the risk of dizzy or fainting spells. Be careful brushing or flossing your teeth or using a toothpick because you may get an infection or bleed more easily. If you have any dental work done, tell your dentist you are receiving this medicine. This  medicine may make it more difficult to get pregnant. Talk to your health care provider if you are concerned about your fertility. What side effects may I notice from receiving this medicine? Side effects that you should report to your doctor or health care provider as soon as possible:  allergic reactions (skin rash, itching or hives; swelling of the face, lips, or tongue)  bleeding (bloody or black, tarry stools; red or dark brown urine; spitting up blood or brown material that looks like coffee grounds; red spots on the skin; unusual bruising or bleeding from the eyes, gums, or nose)  blood clot (chest pain; shortness of breath; pain, swelling, or warmth in the leg)  blurred vision OR changes in vision  fast, irregular heartbeat  heart failure (trouble breathing; fast, irregular heartbeat; sudden weight gain; swelling of the ankles, feet, hands; unusually weak or tired)  heart attack (trouble breathing; pain or tightness in the chest, neck, back or arms; unusually weak or tired)  high potassium  levels (chest pain; fast, irregular heartbeat; muscle weakness)  kidney injury (trouble passing urine or change in the amount of urine)  liver injury (dark yellow or brown urine; general ill feeling or flu-like symptoms; loss of appetite, right upper belly pain; unusually weak or tired, yellowing of the eyes or skin)  low red blood cell counts (trouble breathing; feeling faint; lightheaded, falls; unusually weak or tired)  palpitations  rash, fever, and swollen lymph nodes  redness, blistering, peeling, or loosening of the skin, including inside the mouth  stroke (changes in vision; confusion; trouble speaking or understanding; severe headaches; sudden numbness or weakness of the face, arm or leg; trouble walking; dizziness; loss of balance or coordination)  trouble breathing Side effects that usually do not require medical attention (report to your doctor or health care provider if they  continue or are bothersome):  constipation  diarrhea  dizziness  headache  nausea, vomiting  passing gas This list may not describe all possible side effects. Call your doctor for medical advice about side effects. You may report side effects to FDA at 1-800-FDA-1088. Where should I keep my medicine? Keep out of the reach of children and pets. Store at room temperature between 15 and 30 degrees C (59 and 86 degrees F). Do not freeze. Protect from heat. Get rid of any unused medicine after the expiration date. To get rid of medicines that are no longer needed or have expired:  Take the medicine to a medicine take-back program. Check with your pharmacy or law enforcement to find a location.  If you cannot return the medicine, check the label or package insert to see if the medicine should be thrown out in the garbage or flushed down the toilet. If you are not sure, ask your health care provider. If it is safe to put it in the trash, empty the medicine out of the container. Mix the medicine with cat litter, dirt, coffee grounds, or other unwanted substance. Seal the mixture in a bag or container. Put it in the trash. NOTE: This sheet is a summary. It may not cover all possible information. If you have questions about this medicine, talk to your doctor, pharmacist, or health care provider.  2021 Elsevier/Gold Standard (2019-11-06 16:01:32)

## 2020-07-12 ENCOUNTER — Ambulatory Visit: Payer: 59 | Admitting: Adult Health Nurse Practitioner

## 2020-07-26 DIAGNOSIS — Z8616 Personal history of COVID-19: Secondary | ICD-10-CM

## 2020-07-26 HISTORY — DX: Personal history of COVID-19: Z86.16

## 2020-08-01 ENCOUNTER — Other Ambulatory Visit: Payer: Self-pay | Admitting: Internal Medicine

## 2020-08-09 ENCOUNTER — Ambulatory Visit: Payer: Self-pay | Admitting: Diagnostic Neuroimaging

## 2020-09-25 ENCOUNTER — Other Ambulatory Visit: Payer: Self-pay | Admitting: Adult Health

## 2020-10-02 ENCOUNTER — Other Ambulatory Visit: Payer: Self-pay | Admitting: Internal Medicine

## 2020-10-02 MED ORDER — VITAMIN D (ERGOCALCIFEROL) 1.25 MG (50000 UNIT) PO CAPS: ORAL_CAPSULE | ORAL | 3 refills | Status: DC

## 2020-10-07 ENCOUNTER — Ambulatory Visit: Payer: 59 | Admitting: Diagnostic Neuroimaging

## 2020-10-12 ENCOUNTER — Other Ambulatory Visit: Payer: Self-pay

## 2020-10-12 MED ORDER — OZEMPIC (1 MG/DOSE) 4 MG/3ML ~~LOC~~ SOPN: 1.0000 mg | PEN_INJECTOR | SUBCUTANEOUS | 1 refills | Status: DC

## 2020-11-02 ENCOUNTER — Encounter: Payer: Self-pay | Admitting: Adult Health

## 2020-11-02 ENCOUNTER — Other Ambulatory Visit: Payer: Self-pay

## 2020-11-02 ENCOUNTER — Ambulatory Visit: Payer: 59 | Admitting: Adult Health

## 2020-11-02 VITALS — BP 136/74 | HR 97 | Temp 96.6°F | Wt 157.0 lb

## 2020-11-02 DIAGNOSIS — Z79899 Other long term (current) drug therapy: Secondary | ICD-10-CM

## 2020-11-02 DIAGNOSIS — F17200 Nicotine dependence, unspecified, uncomplicated: Secondary | ICD-10-CM

## 2020-11-02 DIAGNOSIS — E785 Hyperlipidemia, unspecified: Secondary | ICD-10-CM

## 2020-11-02 DIAGNOSIS — E559 Vitamin D deficiency, unspecified: Secondary | ICD-10-CM

## 2020-11-02 DIAGNOSIS — E039 Hypothyroidism, unspecified: Secondary | ICD-10-CM | POA: Diagnosis not present

## 2020-11-02 DIAGNOSIS — D509 Iron deficiency anemia, unspecified: Secondary | ICD-10-CM

## 2020-11-02 DIAGNOSIS — E1169 Type 2 diabetes mellitus with other specified complication: Secondary | ICD-10-CM

## 2020-11-02 MED ORDER — SUMATRIPTAN SUCCINATE 100 MG PO TABS: 100.0000 mg | ORAL_TABLET | Freq: Once | ORAL | 0 refills | Status: DC | PRN

## 2020-11-02 MED ORDER — VALACYCLOVIR HCL 1 G PO TABS: 1000.0000 mg | ORAL_TABLET | Freq: Two times a day (BID) | ORAL | 0 refills | Status: DC

## 2020-11-02 MED ORDER — PHENTERMINE HCL 37.5 MG PO TABS: ORAL_TABLET | ORAL | 2 refills | Status: DC

## 2020-11-02 NOTE — Progress Notes (Signed)
3 FOLLOW UP  Assessment and Plan:  Type 2 diabetes mellitus with hyperlipidemia (Lisa [name]) Doing well with ozempic 1 mg/week Eye Exam yearly and Dental Exam every 6 months. Dietary recommendations Physical Activity recommendations - A1C - lipid panel - CMP/GFR  Hypothyroidism, unspecified type -check TSH level, continue medications the same, reminded to take on an empty stomach 30-21mins before food.  - TSH  Seizure disorder (HCC) Continue medications, neuro following  Recurrent major depressive disorder, in partial remission ([name]) Continue to see psych  Hx of morbid obesity/ s/p gastric sleeve - BMI 23 Continue diet/exercise, doing well with ozempic and phentermine PRN during menstrual cycles only (knows not to take on days taking adderal).   Vitamin D deficiency Continue supplement  Med management - CBC, CMP/GFR, magnesium    LAB DONE AT [facility], HAS RX FOR LABS AND WILL GET DONE Future Appointments  Date Time Provider [place]  11/29/2020  4:00 PM Lisa [name] [facility] None  03/13/2021 10:00 AM Lisa [name] [facility] None    HPI  45 y.o. female  presents for  follow up for chol, DM, seizures, anemia.  Works for [employer], gets labs there.   Has lifestyle controlled reflux, rare breakthrough.  She is smoker, actively cutting down, chantix was working but recently can't get as taken off the market, continues to taper slowly without medication.   BMI is Body mass index is 23.87 kg/m., hx of morbid obesity, peak 260 lb, s/p gastric bypass in 2012, she has been working on diet and exercise, working out (lifting/body weight) and walking dogs 3-4 days Has new puppy, planning to start walking.  She is on phentermine with good results, also on ozempic 1 mg weekly and tolerating well, occasional lose stools.  Has severe appetite fluctuations, takes phentermine PRN during menstrual which works well. Would like to restart.  Wt Readings from Last 3  Encounters:  11/02/20 157 lb (71.2 kg)  07/05/20 165 lb (74.8 kg)  03/16/20 156 lb (70.8 kg)   She had BP cuff but hasn't been checking, today their BP is BP: 136/74 She does workout, walking with dogs.  She denies chest pain, shortness of breath, dizziness.     She is not on cholesterol medication and denies myalgias. Her cholesterol is at goal. The cholesterol last visit was:   Lab Results  Component Value Date   CHOL 215 (H) 01/19/2020   HDL 92 01/19/2020   LDLCALC 110 (H) 01/19/2020   TRIG 72 01/19/2020    She has been working on diet and exercise for diabetes  with hyperlipidemia not on medications due to well controlled A1C Without CKD She was on ozempic 1 mg once a week, has been off but would like to restart, felt she tolerated 1 mg dose the best.  No CAD, CHF, or PVD denies paresthesia of the feet, polydipsia, polyuria and visual disturbances.  Last A1C in the office was:  Lab Results  Component Value Date   HGBA1C 5.2 01/19/2020   Lab Results  Component Value Date   GFRNONAA 108 01/19/2020   Patient is on Vitamin D supplement, 50000 IU once a week.  Lab Results  Component Value Date   VD25OH 48.6 01/19/2020     She is on thyroid medication. Her medication was not changed last visit. Taking levothyroxine 300 mcg daily, takes on empty stomach.  Lab Results  Component Value Date   TSH 1.130 01/29/2020   Due to her gastric bypass she has has  severe symptomatic iron def anemia, has PRN iron infusions.   Lab Results  Component Value Date   IRON 45 01/19/2020   TIBC 393 03/21/2018   FERRITIN 74 01/19/2020   She has had epilepsy since she was 14, on valproic acid 250mg , has not had a seizure in a decade, had a trial off but had seizures. Should remain on medication. Follows with Dr. Tommy Medal. He checks levels.    Current Outpatient Medications on File Prior to Visit  Medication Sig  . diazepam (VALIUM) 5 MG tablet 1/2 to 1 daily as needed for severe anxiety   . fluticasone (FLONASE) 50 MCG/ACT nasal spray PLACE 2 SPRAYS INTO BOTH NOSTRILS AT BEDTIME.  Marland Kitchen levothyroxine (SYNTHROID) 300 MCG tablet Take  1 tablet  Daily  on an empty stomach with only water for 30 minutes & no Antacid meds, Calcium or Magnesium for 4 hours & avoid Biotin  . omeprazole (PRILOSEC) 20 MG capsule   . Semaglutide, 1 MG/DOSE, (OZEMPIC, 1 MG/DOSE,) 4 MG/3ML SOPN Inject 1 mg into the skin once a week.  . traZODone (DESYREL) 50 MG tablet Take 1 tablet at Bedtime for Sleep  . valproic acid (DEPAKENE) 250 MG capsule Take  2 capsules  3 x /day for Mood Stability  . Vitamin D, Ergocalciferol, (DRISDOL) 1.25 MG (50000 UNIT) CAPS capsule Take  1 capsule  weekly  for Vitamin D Deficiency  . etonogestrel-ethinyl estradiol (NUVARING) 0.12-0.015 MG/24HR vaginal ring Insert one per vagina every 21 days for a total of 4 consecutive readings. Take a three-day break and then start with one ring every 21 days for a total of 4 readings again Please note this requires further rings every 3 months (Patient not taking: Reported on 11/02/2020)  . Semaglutide,0.25 or 0.5MG /DOS, (OZEMPIC, 0.25 OR 0.5 MG/DOSE,) 2 MG/1.5ML SOPN Take 0.25 mg weekly injection x 4 week, then increase to 0.5 mg weekly.  . varenicline (CHANTIX STARTING MONTH PAK) 0.5 MG X 11 & 1 MG X 42 tablet Take one 0.5 mg tablet by mouth once daily for 3 days, then increase to one 0.5 mg tablet twice daily for 4 days, then increase to one 1 mg tablet twice daily. (Patient not taking: No sig reported)   No current facility-administered medications on file prior to visit.   Medical History:  Past Medical History:  Diagnosis Date  . Abdominal cramping 08/10/2011  . Anxiety   . Asthma   . Depression   . Habitual abortion history, antepartum   . Headache(784.0)   . History of diabetes mellitus    prior to gastric bypass in 2012  . History of recurrent miscarriages, not currently pregnant 10/11/2011  . Hypothyroidism   . Infertility  associated with anovulation   . Obesity    BMi 33  . Seizures ([name])   . UTI (lower urinary tract infection) 08/14/2011   Allergies Allergies  Allergen Reactions  . Codeine Itching, Rash and Other (See Comments)    OPIOIDS - MORPHINE & RELATED    SURGICAL HISTORY She  has a past surgical history that includes Gastric bypass (2012) and Dilation and curettage of uterus. FAMILY HISTORY Her family history includes Alcohol abuse in her maternal uncle; Alzheimer's disease in her maternal grandfather, maternal grandmother, and paternal grandmother; Anorexia nervosa in her sister; Anxiety disorder in her maternal uncle; Depression in her maternal grandmother, maternal uncle, and mother; Diabetes in her maternal grandmother and mother. SOCIAL HISTORY She  reports that she quit smoking about 20 years ago.  Her smoking use included cigarettes. She has never used smokeless tobacco. She reports current alcohol use of about 2.0 standard drinks of alcohol per week. She reports that she does not use drugs.   Review of Systems: Review of Systems  Constitutional: Negative for chills, diaphoresis, fever, malaise/fatigue and weight loss.  HENT: Negative.   Respiratory: Negative.   Cardiovascular: Negative.   Gastrointestinal: Negative for abdominal pain, blood in stool, constipation, diarrhea, heartburn, melena, nausea and vomiting.  Genitourinary: Negative.   Musculoskeletal: Negative.   Skin: Negative.   Neurological: Negative.  Negative for weakness.  Psychiatric/Behavioral: Negative for depression, hallucinations, memory loss, substance abuse and suicidal ideas. The patient is not nervous/anxious and does not have insomnia.     Physical Exam: Estimated body mass index is 23.87 kg/m as calculated from the following:   Height as of 03/16/20: 5\' 8"  (1.727 m).   Weight as of this encounter: 157 lb (71.2 kg). BP 136/74   Pulse 97   Temp (!) 96.6 F (35.9 C)   Wt 157 lb (71.2 kg)   SpO2 99%   BMI  23.87 kg/m  General Appearance: Well nourished, in no apparent distress.  Eyes: PERRLA, EOMs, conjunctiva no swelling or erythema Sinuses: No Frontal/maxillary tenderness  ENT/Mouth: Ext aud canals clear, normal light reflex with TMs without erythema, bulging. Good dentition. No erythema, swelling, or exudate on post pharynx. Tonsils not swollen or erythematous. Hearing normal. Crowded mouth.  Neck: Supple, thyroid normal. No bruits  Respiratory: Respiratory effort normal, BS equal bilaterally without rales, rhonchi, wheezing or stridor.  Cardio: RRR without murmurs, rubs or gallops. Prominent S2. Brisk peripheral pulses without edema.  Chest: symmetric, with normal excursions and percussion.  Abdomen: Soft, nontender, no guarding, rebound, hernias, masses, or organomegaly. .  Lymphatics: Non tender without lymphadenopathy.  Musculoskeletal: Full ROM all peripheral extremities,5/5 strength, and normal gait.  Skin: Warm, dry without rashes, lesions, ecchymosis. Neuro: Cranial nerves intact, reflexes equal bilaterally. Normal muscle tone, no cerebellar symptoms. Sensation intact.  Psych: Awake and oriented X 3, normal affect, Insight and Judgment appropriate.    Gorden Harms Lisa [name] 5:30 PM [facility]

## 2020-11-12 ENCOUNTER — Other Ambulatory Visit: Payer: Self-pay | Admitting: Adult Health

## 2020-11-12 DIAGNOSIS — E039 Hypothyroidism, unspecified: Secondary | ICD-10-CM

## 2020-11-12 LAB — IRON,TIBC AND FERRITIN PANEL
Ferritin: 42 ng/mL (ref 15–150)
Iron Saturation: 11 % — ABNORMAL LOW (ref 15–55)
Iron: 36 ug/dL (ref 27–159)
Total Iron Binding Capacity: 340 ug/dL (ref 250–450)
UIBC: 304 ug/dL (ref 131–425)

## 2020-11-12 LAB — CBC WITH DIFFERENTIAL/PLATELET
Basophils Absolute: 0 10*3/uL (ref 0.0–0.2)
Basos: 1 %
EOS (ABSOLUTE): 0.3 10*3/uL (ref 0.0–0.4)
Eos: 5 %
Hematocrit: 40.1 % (ref 34.0–46.6)
Hemoglobin: 13.9 g/dL (ref 11.1–15.9)
Immature Grans (Abs): 0 10*3/uL (ref 0.0–0.1)
Immature Granulocytes: 0 %
Lymphocytes Absolute: 2.3 10*3/uL (ref 0.7–3.1)
Lymphs: 46 %
MCH: 32.9 pg (ref 26.6–33.0)
MCHC: 34.7 g/dL (ref 31.5–35.7)
MCV: 95 fL (ref 79–97)
Monocytes Absolute: 0.8 10*3/uL (ref 0.1–0.9)
Monocytes: 17 %
Neutrophils Absolute: 1.5 10*3/uL (ref 1.4–7.0)
Neutrophils: 31 %
Platelets: 275 10*3/uL (ref 150–450)
RBC: 4.22 x10E6/uL (ref 3.77–5.28)
RDW: 12.5 % (ref 11.7–15.4)
WBC: 4.8 10*3/uL (ref 3.4–10.8)

## 2020-11-12 LAB — LIPID PANEL
Chol/HDL Ratio: 2.3 ratio (ref 0.0–4.4)
Cholesterol, Total: 186 mg/dL (ref 100–199)
HDL: 80 mg/dL (ref >39)
LDL Chol Calc (NIH): 96 mg/dL (ref 0–99)
Triglycerides: 50 mg/dL (ref 0–149)
VLDL Cholesterol Cal: 10 mg/dL (ref 5–40)

## 2020-11-12 LAB — HEMOGLOBIN A1C
Est. average glucose Bld gHb Est-mCnc: 100 mg/dL
Hgb A1c MFr Bld: 5.1 % (ref 4.8–5.6)

## 2020-11-12 LAB — TSH: TSH: 0.073 u[IU]/mL — ABNORMAL LOW (ref 0.450–4.500)

## 2020-11-12 MED ORDER — LEVOTHYROXINE SODIUM 300 MCG PO TABS: ORAL_TABLET | ORAL | 3 refills | Status: DC

## 2020-11-29 ENCOUNTER — Ambulatory Visit: Payer: 59 | Admitting: Diagnostic Neuroimaging

## 2020-12-06 ENCOUNTER — Other Ambulatory Visit: Payer: Self-pay | Admitting: Internal Medicine

## 2020-12-06 DIAGNOSIS — E039 Hypothyroidism, unspecified: Secondary | ICD-10-CM

## 2020-12-06 MED ORDER — LEVOTHYROXINE SODIUM 300 MCG PO TABS: ORAL_TABLET | ORAL | 3 refills | Status: DC

## 2020-12-09 ENCOUNTER — Other Ambulatory Visit: Payer: Self-pay | Admitting: Adult Health

## 2020-12-09 DIAGNOSIS — E039 Hypothyroidism, unspecified: Secondary | ICD-10-CM

## 2020-12-09 MED ORDER — LEVOTHYROXINE SODIUM 300 MCG PO TABS: ORAL_TABLET | ORAL | 3 refills | Status: DC

## 2020-12-14 ENCOUNTER — Other Ambulatory Visit: Payer: Self-pay | Admitting: Internal Medicine

## 2020-12-14 DIAGNOSIS — E039 Hypothyroidism, unspecified: Secondary | ICD-10-CM

## 2020-12-14 MED ORDER — LEVOTHYROXINE SODIUM 300 MCG PO TABS: ORAL_TABLET | ORAL | 3 refills | Status: DC

## 2021-02-02 ENCOUNTER — Encounter: Payer: Self-pay | Admitting: Neurology

## 2021-02-02 ENCOUNTER — Ambulatory Visit: Payer: 59 | Admitting: Neurology

## 2021-02-02 VITALS — BP 123/85 | HR 89 | Ht 68.0 in | Wt 150.5 lb

## 2021-02-02 DIAGNOSIS — G40309 Generalized idiopathic epilepsy and epileptic syndromes, not intractable, without status epilepticus: Secondary | ICD-10-CM | POA: Diagnosis not present

## 2021-02-02 MED ORDER — LAMOTRIGINE 25 MG PO TABS: 25.0000 mg | ORAL_TABLET | Freq: Every day | ORAL | 0 refills | Status: DC

## 2021-02-02 NOTE — Patient Instructions (Addendum)
Week 1: Decrease Depakote 500 mg BID and start Lamotrigine 25 every other day  Week 2: Decrease Depakote to 250 mg in the morning and 500 mg at night and Increase Lamotrigine to 25 mg every day  Week 3: Decrease Depakote to 250 mg twice a day and Increase Lamotrigine to 25 mg BID  Week 4: Decrease Depakote to 250 mg in the morning  and Increase Lamotrigine to 25 mg in the morning and 50 mg at night  Week 5: Discontinue Depakote and continue with Lamotrigine 50 mg in the morning and 50 mg at night  Week 6: Increase Lamotrigine to 50 mg in the morning and 75 mg at night  Week 7: Increase Lamotrigine to 75 mg twice a day  Week 8: Increase Lamotrigine to 100  mg tiwce a day   Then continue with Lamotrigine 100 mg twice a day and monitor for adverse effects that we discussed   Return to clinic in 3 months, at that time, we will obtain a Lamotrigine level.

## 2021-02-02 NOTE — Progress Notes (Signed)
[facility]  PATIENT: Lisa [name] DOB: 13-Sep-1975  REFERRING CLINICIAN: Unk Pinto, MD HISTORY FROM: Patient  REASON FOR VISIT: Seizure/Here to establish care    HISTORICAL  CHIEF COMPLAINT:  Chief Complaint  Patient presents with   Seizures    New patient:  History of seizures 13 years ago, Had a seizure a year ago, feels it was r/t drinking, hasn't drank since, has stayed sober, no seizures since.  Room 13, alone in room    HISTORY OF PRESENT ILLNESS:  This is a 45 year old woman with long standing history of seizures who is presenting to establish care.  She reported she was first diagnosed with seizures at the age of 57.  Initially she was diagnosed with staring spell possibly absence seizure.  She was started on Depakote which controlled her seizures.  She has had her first tonic-clonic seizure was at the age of 32 when she was in college, this is in the setting of high stress and poor sleep.  Before this first tonic-clonic seizure she was having episodes of jerking of the right arm but these episode was not diagnosed. She mentioned that last year she was under a lot of stress, was drinking a lot and did have a tonic-clonic seizure. Previous to that,  her previous seizure was 16 years ago. Patient reported at one point in her life when she was trying to have a baby with her husband and she was switched from Depakote to lamotrigine but due to high co-pay and not in a relationship anymore she was switched back to Depakote. Currently she is taking Depakote 750 mg twice a day.  Denies any side effect from medication.  Occasionally when she is stressed,  will have hand twich but no generalized seizures.    Handedness: Right handed   Seizure Type: Generalized seizure  Current frequency: one last year, prior to that was 16 years ago.   Any injuries from seizures: None   Seizure risk factors: No sz risk factors   Previous ASMs: Valproic acid/ Lamotrigine    Currenty ASMs: Valproic acid 750 mg BID   ASMs side effects: None  Brain Images: Reported ar normal previously   Previous EEGs: Reported as abnormal    OTHER MEDICAL CONDITIONS: Hypothyroidism, DM, Depression/Anxiety   REVIEW OF SYSTEMS: Full 14 system review of systems performed and negative with exception of: as noted in the HPI  ALLERGIES: Allergies  Allergen Reactions   Codeine Itching, Rash and Other (See Comments)    OPIOIDS - MORPHINE & RELATED    HOME MEDICATIONS: Outpatient Medications Prior to Visit  Medication Sig Dispense Refill   diazepam (VALIUM) 5 MG tablet 1/2 to 1 daily as needed for severe anxiety 30 tablet 0   Esketamine HCl, 84 MG Dose, (SPRAVATO, 84 MG DOSE,) 28 MG/DEVICE SOPK Place into the nose every 14 (fourteen) days.     levothyroxine (SYNTHROID) 300 MCG tablet Take  1 tablet  Daily except 1/2 tab on Sunday. Take on an empty stomach with only water for 30 minutes & no Antacid meds, Calcium or Magnesium for 4 hours & avoid Biotin 90 tablet 3   omeprazole (PRILOSEC) 20 MG capsule      phentermine (ADIPEX-P) 37.5 MG tablet Take 1/2- 1 tab daily as needed for appetite and weight loss. 30 tablet 2   Semaglutide, 1 MG/DOSE, (OZEMPIC, 1 MG/DOSE,) 4 MG/3ML SOPN Inject 1 mg into the skin once a week. 9 mL 1   SUMAtriptan (IMITREX) 100 MG  tablet Take 1 tablet (100 mg total) by mouth once as needed for migraine. May repeat in 2 hours if headache persists or recurs. 10 tablet 0   traZODone (DESYREL) 50 MG tablet Take 1 tablet at Bedtime for Sleep 90 tablet 3   valACYclovir (VALTREX) 1000 MG tablet Take 1 tablet (1,000 mg total) by mouth 2 (two) times daily. For 10 days.  Start within 72 hours of symptom onset. 20 tablet 0   varenicline (CHANTIX STARTING MONTH PAK) 0.5 MG X 11 & 1 MG X 42 tablet Take one 0.5 mg tablet by mouth once daily for 3 days, then increase to one 0.5 mg tablet twice daily for 4 days, then increase to one 1 mg tablet twice daily. 53 tablet 0    Vitamin D, Ergocalciferol, (DRISDOL) 1.25 MG (50000 UNIT) CAPS capsule Take  1 capsule  weekly  for Vitamin D Deficiency 13 capsule 3   Vortioxetine HBr (TRINTELLIX PO) Take by mouth.     valproic acid (DEPAKENE) 250 MG capsule Take  2 capsules  3 x /day for Mood Stability 540 capsule 3   etonogestrel-ethinyl estradiol (NUVARING) 0.12-0.015 MG/24HR vaginal ring Insert one per vagina every 21 days for a total of 4 consecutive readings. Take a three-day break and then start with one ring every 21 days for a total of 4 readings again Please note this requires further rings every 3 months 4 each 3   fluticasone (FLONASE) 50 MCG/ACT nasal spray PLACE 2 SPRAYS INTO BOTH NOSTRILS AT BEDTIME. 48 g 3   No facility-administered medications prior to visit.    PAST MEDICAL HISTORY: Past Medical History:  Diagnosis Date   Abdominal cramping 08/10/2011   Anxiety    Asthma    Depression    Habitual abortion history, antepartum    Headache(784.0)    History of diabetes mellitus    prior to gastric bypass in 2012   History of recurrent miscarriages, not currently pregnant 10/11/2011   Hypothyroidism    Infertility associated with anovulation    Obesity    BMi 33   Seizures ([place])    UTI (lower urinary tract infection) 08/14/2011    PAST SURGICAL HISTORY: Past Surgical History:  Procedure Laterality Date   DILATION AND CURETTAGE OF UTERUS     GASTRIC BYPASS  2012    FAMILY HISTORY: Family History  Problem Relation Age of Onset   Depression Mother    Diabetes Mother    Anorexia nervosa Sister    Alcohol abuse Maternal Uncle    Anxiety disorder Maternal Uncle    Depression Maternal Uncle    Alzheimer's disease Maternal Grandfather    Depression Maternal Grandmother    Alzheimer's disease Maternal Grandmother    Diabetes Maternal Grandmother    Alzheimer's disease Paternal Grandmother     SOCIAL HISTORY: Social History   Socioeconomic History   Marital status: Divorced    Spouse  name: Not on file   Number of children: Not on file   Years of education: Not on file   Highest education level: Not on file  Occupational History   Not on file  Tobacco Use   Smoking status: Every Day    Packs/day: 0.25    Types: Cigarettes   Smokeless tobacco: Never  Vaping Use   Vaping Use: Former  Substance and Sexual Activity   Alcohol use: Yes    Alcohol/week: 2.0 standard drinks    Types: 2 Cans of beer per week    Comment: occassionally  Drug use: No   Sexual activity: Yes    Birth control/protection: Other-see comments    Comment: nuva ring  Other Topics Concern   Not on file  Social History Narrative   Lives with roomates   Right Handed   Drinks 1 monster evening twice a day, occasionally coffee twice a day, no soda.    Social Determinants of Health   Financial Resource Strain: Not on file  Food Insecurity: Not on file  Transportation Needs: Not on file  Physical Activity: Not on file  Stress: Not on file  Social Connections: Not on file  Intimate Partner Violence: Not on file     PHYSICAL EXAM  GENERAL EXAM/CONSTITUTIONAL: Vitals:  Vitals:   02/02/21 1100  BP: 123/85  Pulse: 89  Weight: 150 lb 8 oz (68.3 kg)  Height: '5\' 8"'$  (1.727 m)   Body mass index is 22.88 kg/m. Wt Readings from Last 3 Encounters:  02/02/21 150 lb 8 oz (68.3 kg)  11/02/20 157 lb (71.2 kg)  07/05/20 165 lb (74.8 kg)   Patient is in no distress; well developed, nourished and groomed; neck is supple  CARDIOVASCULAR: Examination of carotid arteries is normal; no carotid bruits Regular rate and rhythm, no murmurs Examination of peripheral vascular system by observation and palpation is normal  EYES: Pupils round and reactive to light, Visual fields full to confrontation, Extraocular movements intacts,   MUSCULOSKELETAL: Gait, strength, tone, movements noted in Neurologic exam below  NEUROLOGIC: MENTAL STATUS:  awake, alert, oriented to person, place and time recent  and remote memory intact normal attention and concentration language fluent, comprehension intact, naming intact fund of knowledge appropriate  CRANIAL NERVE:  2nd - no papilledema or hemorrhages on fundoscopic exam 2nd, 3rd, 4th, 6th - pupils equal and reactive to light, visual fields full to confrontation, extraocular muscles intact, no nystagmus 5th - facial sensation symmetric 7th - facial strength symmetric 8th - hearing intact 9th - palate elevates symmetrically, uvula midline 11th - shoulder shrug symmetric 12th - tongue protrusion midline  MOTOR:  normal bulk and tone, full strength in the BUE, BLE  SENSORY:  normal and symmetric to light touch, pinprick, temperature, vibration  COORDINATION:  finger-nose-finger, fine finger movements normal  REFLEXES:  deep tendon reflexes present and symmetric  GAIT/STATION:  normal     DIAGNOSTIC DATA (LABS, IMAGING, TESTING) - I reviewed patient records, labs, notes, testing and imaging myself where available.  Lab Results  Component Value Date   WBC 4.8 11/11/2020   HGB 13.9 11/11/2020   HCT 40.1 11/11/2020   MCV 95 11/11/2020   PLT 275 11/11/2020      Component Value Date/Time   NA 138 01/19/2020 1538   K 4.1 01/19/2020 1538   CL 100 01/19/2020 1538   CO2 24 01/19/2020 1538   GLUCOSE 98 01/19/2020 1538   BUN 12 01/19/2020 1538   CREATININE 0.67 01/19/2020 1538   CALCIUM 9.8 01/19/2020 1538   PROT 6.7 01/29/2020 0930   ALBUMIN 4.2 01/29/2020 0930   AST 45 (H) 01/29/2020 0930   ALT 57 (H) 01/29/2020 0930   ALKPHOS 73 01/29/2020 0930   BILITOT 1.1 01/29/2020 0930   GFRNONAA 108 01/19/2020 1538   GFRAA 125 01/19/2020 1538   Lab Results  Component Value Date   CHOL 186 11/11/2020   HDL 80 11/11/2020   LDLCALC 96 11/11/2020   TRIG 50 11/11/2020   Lab Results  Component Value Date   HGBA1C 5.1 11/11/2020   Lab Results  Component Value Date   VITAMINB12 1,513 (H) 01/19/2020   Lab Results  Component  Value Date   TSH 0.073 (L) 11/11/2020     Brain images and EEG not available for review.   ASSESSMENT AND PLAN  45 y.o. year old female with past medical history of anxiety depression hypothyroidism and generalized epilepsy who is presenting to establish care.  Her seizures are well controlled on Depakote monotherapy.  She did have breakthrough seizure in the setting of alcohol consumption.  Currently she is reporting side effect of abdominal pain every time she takes the Depakote and would like to switch to a different medication.  Since she tolerated lamotrigine in the past we have decided to switching her from Depakote to lamotrigine.  Weekly titration given to the patient.  I will see her in 3 months at that time I will obtain a lamotrigine level.  I advised patient to call me as soon as she notes any side effects from the medication, if she developed a rash or if she has any breakthrough seizure.  Localization:  Ddx:  1. Generalized idiopathic epilepsy and epileptic syndromes, not intractable, without status epilepticus ([place])       PLAN: Week 1: Decrease Depakote 500 mg BID and start Lamotrigine 25 every other day  Week 2: Decrease Depakote to 250 mg in the morning and 500 mg at night and Increase Lamotrigine to 25 mg every day  Week 3: Decrease Depakote to 250 mg twice a day and Increase Lamotrigine to 25 mg BID  Week 4: Decrease Depakote to 250 mg in the morning  and Increase Lamotrigine to 25 mg in the morning and 50 mg at night  Week 5: Discontinue Depakote and continue with Lamotrigine 50 mg in the morning and 50 mg at night  Week 6: Increase Lamotrigine to 50 mg in the morning and 75 mg at night  Week 7: Increase Lamotrigine to 75 mg twice a day  Week 8: Increase Lamotrigine to 100 mg tiwce a day   Then continue with Lamotrigine 100 mg twice a day and monitor for adverse effects that we discussed   Return to clinic in 3 months, at that time, we will obtain a Lamotrigine  level.      Per [facility] statutes, patients with seizures are not allowed to drive until they have been seizure-free for six months.  Other recommendations include using caution when using heavy equipment or power tools. Avoid working on ladders or at heights. Take showers instead of baths.  Do not swim alone.  Ensure the water temperature is not too high on the home water heater. Do not go swimming alone. Do not lock yourself in a room alone (i.e. bathroom). When caring for infants or small children, sit down when holding, feeding, or changing them to minimize risk of injury to the child in the event you have a seizure. Maintain good sleep hygiene. Avoid alcohol.  Also recommend adequate sleep, hydration, good diet and minimize stress.   During the Seizure  - First, ensure adequate ventilation and place patients on the floor on their left side  Loosen clothing around the neck and ensure the airway is patent. If the patient is clenching the teeth, do not force the mouth open with any object as this can cause severe damage - Remove all items from the surrounding that can be hazardous. The patient may be oblivious to what's happening and may not even know what he or she is doing.  If the patient is confused and wandering, either gently guide him/her away and block access to outside areas - Reassure the individual and be comforting - Call 911. In most cases, the seizure ends before EMS arrives. However, there are cases when seizures may last over 3 to 5 minutes. Or the individual may have developed breathing difficulties or severe injuries. If a pregnant patient or a person with diabetes develops a seizure, it is prudent to call an ambulance. - Finally, if the patient does not regain full consciousness, then call EMS. Most patients will remain confused for about 45 to 90 minutes after a seizure, so you must use judgment in calling for help. - Avoid restraints but make sure the patient is in a  bed with padded side rails - Place the individual in a lateral position with the neck slightly flexed; this will help the saliva drain from the mouth and prevent the tongue from falling backward - Remove all nearby furniture and other hazards from the area - Provide verbal assurance as the individual is regaining consciousness - Provide the patient with privacy if possible - Call for help and start treatment as ordered by the caregiver   After the Seizure (Postictal Stage)  After a seizure, most patients experience confusion, fatigue, muscle pain and/or a headache. Thus, one should permit the individual to sleep. For the next few days, reassurance is essential. Being calm and helping reorient the person is also of importance.  Most seizures are painless and end spontaneously. Seizures are not harmful to others but can lead to complications such as stress on the lungs, brain and the heart. Individuals with prior lung problems may develop labored breathing and respiratory distress.     No orders of the defined types were placed in this encounter.   Meds ordered this encounter  Medications   lamoTRIgine (LAMICTAL) 25 MG tablet    Sig: Take 1 tablet (25 mg total) by mouth daily.    Dispense:  240 tablet    Refill:  0    Return in about 3 months (around 05/05/2021).    Alric Ran, MD 02/02/2021, 3:15 PM  [facility] [address] [place], [place] [address] [phone]

## 2021-02-10 ENCOUNTER — Other Ambulatory Visit: Payer: Self-pay | Admitting: Adult Health

## 2021-02-15 ENCOUNTER — Other Ambulatory Visit: Payer: Self-pay

## 2021-02-15 ENCOUNTER — Ambulatory Visit: Payer: 59 | Admitting: Adult Health

## 2021-02-15 ENCOUNTER — Other Ambulatory Visit: Payer: Self-pay | Admitting: Adult Health

## 2021-02-15 VITALS — BP 122/78 | HR 86 | Temp 97.3°F | Wt 150.0 lb

## 2021-02-15 DIAGNOSIS — M79671 Pain in right foot: Secondary | ICD-10-CM | POA: Diagnosis not present

## 2021-02-15 DIAGNOSIS — E1169 Type 2 diabetes mellitus with other specified complication: Secondary | ICD-10-CM | POA: Diagnosis not present

## 2021-02-15 DIAGNOSIS — Z79899 Other long term (current) drug therapy: Secondary | ICD-10-CM | POA: Diagnosis not present

## 2021-02-15 DIAGNOSIS — E785 Hyperlipidemia, unspecified: Secondary | ICD-10-CM

## 2021-02-15 MED ORDER — MELOXICAM 15 MG PO TABS: ORAL_TABLET | ORAL | 1 refills | Status: DC

## 2021-02-15 NOTE — Progress Notes (Signed)
3 FOLLOW UP  Assessment and Plan:  Type 2 diabetes mellitus with hyperlipidemia ([place]) Doing well with ozempic 1 mg/week Eye Exam yearly and Dental Exam every 6 months. Dietary recommendations Physical Activity recommendations - CMP/GFR  Hypothyroidism, unspecified type -continue medications the same, reminded to take on an empty stomach 30-80mns before food.   Hx of morbid obesity/ s/p gastric sleeve - BMI 22 Continue diet/exercise, doing well with ozempic and phentermine PRN during menstrual cycles only (knows not to take on days taking adderal).   Vitamin D deficiency Continue supplement  Med management - CMP/GFR  Right foot pain ? Joint vs neuroma Given meloxicam x 2 weeks then PRN Xray order placed Rest, ice; cushioned insoles, avoid extended walking/standing short term Refer to podiatry if persistent/unexplained  Will do CMP needed for paperwork, defer all other labs to upcoming CPE  [facility], HAS RX FOR LABS AND WILL GET DONE Future Appointments  Date Time Provider DPace 03/13/2021 10:00 AM MMagda Bernheim NP [facility] None  05/09/2021 11:00 AM CAlric Ran MD [facility] None    HPI  45y.o. female  presents for completion of paperwork and Left foot pain for several months.   She reports right foot pain, ball of foot, no injury, vague/nagging for sevearl months, worst 2nd MTP joint, acutely worse this AM and tender with walking/driving despite cushioned shoes. Notes some tingling radiating sensation to toes.   She is smoker, actively cutting down, chantix was working but recently can't get as taken off the market, continues to taper slowly without medication.   BMI is Body mass index is 22.81 kg/m., hx of morbid obesity, peak 260 lb, s/p gastric bypass in 2012, she has been working on diet and exercise, working out (lifting/body weight) and walking dogs 3-4 days  On ozempic 1 mg weekly and tolerating well, occasional lose stools.  Has  severe appetite fluctuations, takes phentermine PRN during menstrual which works well.  Wt Readings from Last 3 Encounters:  02/15/21 150 lb (68 kg)  02/02/21 150 lb 8 oz (68.3 kg)  11/02/20 157 lb (71.2 kg)   Today their BP is BP: 122/78 She does workout, walking with dogs.  She denies chest pain, shortness of breath, dizziness.     She is not on cholesterol medication and denies myalgias. Her cholesterol is at goal. The cholesterol last visit was:   Lab Results  Component Value Date   CHOL 186 11/11/2020   HDL 80 11/11/2020   LDLCALC 96 11/11/2020   TRIG 50 11/11/2020   CHOLHDL 2.3 11/11/2020    She has been working on diet and exercise for diabetes  with hyperlipidemia not on medications due to well controlled A1C Without CKD She was on ozempic 1 mg once a week, has been off but would like to restart, felt she tolerated 1 mg dose the best.  No CAD, CHF, or PVD denies paresthesia of the feet, polydipsia, polyuria and visual disturbances.  Last A1C in the office was:  Lab Results  Component Value Date   HGBA1C 5.1 11/11/2020   Lab Results  Component Value Date   GFRNONAA 108 01/19/2020   Patient is on Vitamin D supplement, 50000 IU once a week.  Lab Results  Component Value Date   VD25OH 48.6 01/19/2020       Current Outpatient Medications on File Prior to Visit  Medication Sig   diazepam (VALIUM) 5 MG tablet 1/2 to 1 daily as needed for severe anxiety  Esketamine HCl, 84 MG Dose, (SPRAVATO, 84 MG DOSE,) 28 MG/DEVICE SOPK Place into the nose every 14 (fourteen) days.   lamoTRIgine (LAMICTAL) 25 MG tablet Take 1 tablet (25 mg total) by mouth daily.   levothyroxine (SYNTHROID) 300 MCG tablet Take  1 tablet  Daily except 1/2 tab on Sunday. Take on an empty stomach with only water for 30 minutes & no Antacid meds, Calcium or Magnesium for 4 hours & avoid Biotin   omeprazole (PRILOSEC) 20 MG capsule    phentermine (ADIPEX-P) 37.5 MG tablet TAKE HALF TO ONE TABLET BY MOUTH  DAILY AS NEEDED FOR APPETITE AND WEIGHT LOSS   Semaglutide, 1 MG/DOSE, (OZEMPIC, 1 MG/DOSE,) 4 MG/3ML SOPN Inject 1 mg into the skin once a week.   SUMAtriptan (IMITREX) 100 MG tablet Take 1 tablet (100 mg total) by mouth once as needed for migraine. May repeat in 2 hours if headache persists or recurs.   traZODone (DESYREL) 50 MG tablet Take 1 tablet at Bedtime for Sleep   valACYclovir (VALTREX) 1000 MG tablet Take 1 tablet (1,000 mg total) by mouth 2 (two) times daily. For 10 days.  Start within 72 hours of symptom onset.   Vitamin D, Ergocalciferol, (DRISDOL) 1.25 MG (50000 UNIT) CAPS capsule Take  1 capsule  weekly  for Vitamin D Deficiency   Vortioxetine HBr (TRINTELLIX PO) Take by mouth.   etonogestrel-ethinyl estradiol (NUVARING) 0.12-0.015 MG/24HR vaginal ring Insert one per vagina every 21 days for a total of 4 consecutive readings. Take a three-day break and then start with one ring every 21 days for a total of 4 readings again Please note this requires further rings every 3 months (Patient not taking: Reported on 02/15/2021)   fluticasone (FLONASE) 50 MCG/ACT nasal spray PLACE 2 SPRAYS INTO BOTH NOSTRILS AT BEDTIME. (Patient not taking: Reported on 02/15/2021)   varenicline (CHANTIX STARTING MONTH PAK) 0.5 MG X 11 & 1 MG X 42 tablet Take one 0.5 mg tablet by mouth once daily for 3 days, then increase to one 0.5 mg tablet twice daily for 4 days, then increase to one 1 mg tablet twice daily. (Patient not taking: Reported on 02/15/2021)   No current facility-administered medications on file prior to visit.   Medical History:  Past Medical History:  Diagnosis Date   Abdominal cramping 08/10/2011   Anxiety    Asthma    Depression    Habitual abortion history, antepartum    Headache(784.0)    History of diabetes mellitus    prior to gastric bypass in 2012   History of recurrent miscarriages, not currently pregnant 10/11/2011   Hypothyroidism    Infertility associated with anovulation     Obesity    BMi 33   Seizures ([place])    UTI (lower urinary tract infection) 08/14/2011   Allergies Allergies  Allergen Reactions   Codeine Itching, Rash and Other (See Comments)    OPIOIDS - MORPHINE & RELATED    SURGICAL HISTORY She  has a past surgical history that includes Gastric bypass (2012) and Dilation and curettage of uterus. FAMILY HISTORY Her family history includes Alcohol abuse in her maternal uncle; Alzheimer's disease in her maternal grandfather, maternal grandmother, and paternal grandmother; Anorexia nervosa in her sister; Anxiety disorder in her maternal uncle; Depression in her maternal grandmother, maternal uncle, and mother; Diabetes in her maternal grandmother and mother. SOCIAL HISTORY She  reports that she has been smoking cigarettes. She has been smoking an average of .25 packs per day. She has  never used smokeless tobacco. She reports current alcohol use of about 2.0 standard drinks per week. She reports that she does not use drugs.   Review of Systems: Review of Systems  Constitutional:  Negative for chills, diaphoresis, fever, malaise/fatigue and weight loss.  HENT: Negative.  Negative for hearing loss and tinnitus.   Eyes:  Negative for blurred vision and double vision.  Respiratory: Negative.  Negative for cough, shortness of breath and wheezing.   Cardiovascular: Negative.  Negative for chest pain, palpitations, orthopnea, claudication and leg swelling.  Gastrointestinal:  Negative for abdominal pain, blood in stool, constipation, diarrhea, heartburn, melena, nausea and vomiting.  Genitourinary: Negative.   Musculoskeletal:  Positive for joint pain (Right foot several months). Negative for myalgias.  Skin: Negative.  Negative for rash.  Neurological: Negative.  Negative for dizziness, tingling, sensory change, weakness and headaches.  Endo/Heme/Allergies:  Negative for polydipsia.  Psychiatric/Behavioral: Negative.  Negative for depression, hallucinations,  memory loss, substance abuse and suicidal ideas. The patient is not nervous/anxious and does not have insomnia.   All other systems reviewed and are negative.  Physical Exam: Estimated body mass index is 22.81 kg/m as calculated from the following:   Height as of 02/02/21: '5\' 8"'$  (1.727 m).   Weight as of this encounter: 150 lb (68 kg). BP 122/78   Pulse 86   Temp (!) 97.3 F (36.3 C)   Wt 150 lb (68 kg)   SpO2 99%   BMI 22.81 kg/m  General Appearance: Well nourished, in no apparent distress.  Eyes: PERRLA, EOMs, conjunctiva no swelling or erythema Sinuses: No Frontal/maxillary tenderness  ENT/Mouth: Ext aud canals clear, normal light reflex with TMs without erythema, bulging. Good dentition. No erythema, swelling, or exudate on post pharynx. Tonsils not swollen or erythematous. Hearing normal. Crowded mouth.  Neck: Supple, thyroid normal. No bruits  Respiratory: Respiratory effort normal, BS equal bilaterally without rales, rhonchi, wheezing or stridor.  Cardio: RRR without murmurs, rubs or gallops. Prominent S2. Brisk peripheral pulses without edema.  Chest: symmetric, with normal excursions and percussion.  Abdomen: Soft, nontender, no guarding, rebound, hernias, masses, or organomegaly. .  Lymphatics: Non tender without lymphadenopathy.  Musculoskeletal: Full ROM all peripheral extremities,5/5 strength, and normal gait. Tender at R 2nd MTP joint without palpable bony abnormality or lump Skin: Warm, dry without rashes, lesions, ecchymosis. Neuro: Cranial nerves intact, reflexes equal bilaterally. Normal muscle tone, no cerebellar symptoms. Sensation intact.  Psych: Awake and oriented X 3, normal affect, Insight and Judgment appropriate.    Gorden Harms Hobie Kohles 2:09 PM [facility]

## 2021-02-16 ENCOUNTER — Encounter: Payer: Self-pay | Admitting: Adult Health

## 2021-02-16 DIAGNOSIS — R7989 Other specified abnormal findings of blood chemistry: Secondary | ICD-10-CM | POA: Insufficient documentation

## 2021-02-16 LAB — COMPREHENSIVE METABOLIC PANEL
ALT: 46 IU/L — ABNORMAL HIGH (ref 0–32)
AST: 54 IU/L — ABNORMAL HIGH (ref 0–40)
Albumin/Globulin Ratio: 1.6 (ref 1.2–2.2)
Albumin: 4.4 g/dL (ref 3.8–4.8)
Alkaline Phosphatase: 91 IU/L (ref 44–121)
BUN/Creatinine Ratio: 15 (ref 9–23)
BUN: 11 mg/dL (ref 6–24)
Bilirubin Total: 0.8 mg/dL (ref 0.0–1.2)
CO2: 25 mmol/L (ref 20–29)
Calcium: 9.4 mg/dL (ref 8.7–10.2)
Chloride: 99 mmol/L (ref 96–106)
Creatinine, Ser: 0.72 mg/dL (ref 0.57–1.00)
Globulin, Total: 2.8 g/dL (ref 1.5–4.5)
Glucose: 102 mg/dL — ABNORMAL HIGH (ref 65–99)
Potassium: 4.4 mmol/L (ref 3.5–5.2)
Sodium: 138 mmol/L (ref 134–144)
Total Protein: 7.2 g/dL (ref 6.0–8.5)
eGFR: 106 mL/min/{1.73_m2} (ref >59)

## 2021-02-22 ENCOUNTER — Ambulatory Visit: Payer: 59 | Admitting: Diagnostic Neuroimaging

## 2021-03-10 NOTE — Progress Notes (Signed)
Complete Physical Exam  Assessment and Plan: Encounter for General Adult Medical examination with Abnormal Results Due Annually EKG  Type 2 diabetes mellitus with hyperlipidemia ([place]) Off meds with weight loss, will restart ozemipic if needed A1C  Hypothyroidism, unspecified type Hypothyroidism-check TSH level, continue medications the same, reminded to take on an empty stomach 30-14mns before food.  With weight loss may need to cut back on medication TSH  Seizure disorder ([place] Continue medications- may need increase Followed by neuro, currently changing from valproic acid to lamictal  Recurrent major depressive disorder, in partial remission ([place] Continue to see psych  Hyperlipidemia No current medication Continue diet and exercise Check Lipid panel and CMP  Obesity surgery status At goal weight, continue to increase exercise  Vitamin D deficiency Continue supplement  Iron deficiency anemia Check CBC, Iron/TIBC, Ferritin  COPD Via CXR Used Chantix in past, currently not interested in quitting smoking  Discussed med's effects and SE's. Screening labs and tests as requested with regular follow-up as recommended. [facility], HAS RX FOR LABS AND WILL GET DONE Future Appointments  Date Time Provider DAlcalde 05/09/2021 11:00 AM CAlric Ran MD [facility] None  03/13/2022 10:00 AM MMagda Bernheim NP [facility] None   zz HPI  45y.o. female  presents for  follow up for chol, DM, seizures, anemia.    Her blood pressure has been controlled at home, today their BP is BP: 120/90 She does workout she is walking more due to 2 dogs.  She denies chest pain, shortness of breath, dizziness.  She had a negative rheumatoid work up  She has had epilepsy since she was 89 switching from Valproic acid to Lamictal. Followed by neurology. She lives very close to her parents and has discussed wanting to drive to her parents house. Since not drinking she is feeling  much better, no more seizures.   She has a productive cough, mostly in the morning. She is still smoking 1/2 pack per day.  CXR showed Mild hyperinflation without acute finding.  She is 7 years s/p gastric bypass, she was 260 before the surgery and now 143 .  Slightly lower than she would like. [place] [employer], seafood did cause some diarrhea BMI is Body mass index is 22.52 kg/m., she is working on diet and exercise. Wt Readings from Last 3 Encounters:  03/13/21 143 lb 12.8 oz (65.2 kg)  02/15/21 150 lb (68 kg)  02/02/21 150 lb 8 oz (68.3 kg)    She is not on cholesterol medication and denies myalgias. Her cholesterol is at goal. The cholesterol last visit was:   Lab Results  Component Value Date   CHOL 186 11/11/2020   HDL 80 11/11/2020   LDLCALC 96 11/11/2020   TRIG 50 11/11/2020   CHOLHDL 2.3 11/11/2020    She has been working on diet and exercise for diabetes  with hyperlipidemia not on medications Without CKD She was on ozempic 1 mg once a week but with weight loss we are doing a trial off but will restart if she needs to No CAD, CHF, or PVD denies paresthesia of the feet, polydipsia, polyuria and visual disturbances.  Last A1C in the office was:  Lab Results  Component Value Date   HGBA1C 5.1 11/11/2020     Lab Results  Component Value Date   GFRNONAA 108 01/19/2020   Patient is on Vitamin D supplement.   Lab Results  Component Value Date   VD25OH  48.6 01/19/2020     She is on thyroid medication. Her medication was changed last visit to levothyroxine 37mg daily except 1/2 tab on Sunday Lab Results  Component Value Date   TSH 0.073 (L) 11/11/2020  .   She is on trazodone for sleep, takes PRN, uses once a month.  She is on the valium PRN as well for anxiety.   Menstrual cycle stopped after having booster for Covid 08/2020  No menopausal symptoms.  Has GYN appointment next month.  Due to her gastric bypass she has has severe symptomatic iron  def anemia, she has had an iron transfusion in the past and remains on iron at this time and is feeling better.  Lab Results  Component Value Date   IRON 36 11/11/2020   TIBC 340 11/11/2020   FERRITIN 42 11/11/2020     Current Outpatient Medications (Endocrine & Metabolic):    levothyroxine (SYNTHROID) 300 MCG tablet, Take  1 tablet  Daily except 1/2 tab on Sunday. Take on an empty stomach with only water for 30 minutes & no Antacid meds, Calcium or Magnesium for 4 hours & avoid Biotin   Semaglutide, 1 MG/DOSE, (OZEMPIC, 1 MG/DOSE,) 4 MG/3ML SOPN, Inject 1 mg into the skin once a week.   Current Outpatient Medications (Respiratory):    fluticasone (FLONASE) 50 MCG/ACT nasal spray, PLACE 2 SPRAYS INTO BOTH NOSTRILS AT BEDTIME.  Current Outpatient Medications (Analgesics):    SUMAtriptan (IMITREX) 100 MG tablet, Take 1 tablet (100 mg total) by mouth once as needed for migraine. May repeat in 2 hours if headache persists or recurs.   Current Outpatient Medications (Other):    diazepam (VALIUM) 5 MG tablet, 1/2 to 1 daily as needed for severe anxiety   Esketamine HCl, 84 MG Dose, (SPRAVATO, 84 MG DOSE,) 28 MG/DEVICE SOPK, Place into the nose every 14 (fourteen) days.   lamoTRIgine (LAMICTAL) 25 MG tablet, Take 1 tablet (25 mg total) by mouth daily.   omeprazole (PRILOSEC) 20 MG capsule,    phentermine (ADIPEX-P) 37.5 MG tablet, TAKE HALF TO ONE TABLET BY MOUTH DAILY AS NEEDED FOR APPETITE AND WEIGHT LOSS   traZODone (DESYREL) 50 MG tablet, Take 1 tablet at Bedtime for Sleep   valACYclovir (VALTREX) 1000 MG tablet, Take 1 tablet (1,000 mg total) by mouth 2 (two) times daily. For 10 days.  Start within 72 hours of symptom onset.   Vitamin D, Ergocalciferol, (DRISDOL) 1.25 MG (50000 UNIT) CAPS capsule, Take  1 capsule  weekly  for Vitamin D Deficiency   Vortioxetine HBr (TRINTELLIX PO), Take by mouth.  Medical History:  Past Medical History:  Diagnosis Date   Abdominal cramping 08/10/2011    Anxiety    Asthma    Depression    Habitual abortion history, antepartum    Headache(784.0)    History of diabetes mellitus    prior to gastric bypass in 2012   History of recurrent miscarriages, not currently pregnant 10/11/2011   Hypothyroidism    Infertility associated with anovulation    Obesity    BMi 33   Seizures ([place]    UTI (lower urinary tract infection) 08/14/2011   Allergies Allergies  Allergen Reactions   Codeine Itching, Rash and Other (See Comments)    OPIOIDS - MORPHINE & RELATED   Immunization History  Administered Date(s) Administered   HPV 9-valent 04/07/2020   Influenza, Seasonal, Injecte, Preservative Fre 04/30/2016   Influenza,inj,Quad PF,6+ Mos 05/24/2017, 04/10/2018   Influenza,inj,quad, With Preservative 04/26/2015   PFIZER(Purple Top)SARS-COV-2  Vaccination 09/24/2019, 10/21/2019, 05/25/2020   Pneumococcal Polysaccharide-23 02/12/2019   Tdap 07/16/2017   Health Maintenance  Topic Date Due   FOOT EXAM  Never done   OPHTHALMOLOGY EXAM  Never done   URINE MICROALBUMIN  Never done   HIV Screening  Never done   Hepatitis C Screening  Never done   PAP SMEAR-Modifier  01/09/2020   COVID-19 Vaccine (4 - Booster for Pfizer series) 08/17/2020   INFLUENZA VACCINE  01/23/2021   HEMOGLOBIN A1C  05/14/2021   TETANUS/TDAP  07/17/2027   HPV VACCINES  Aged Out     SURGICAL HISTORY She  has a past surgical history that includes Gastric bypass (2012) and Dilation and curettage of uterus. FAMILY HISTORY Her family history includes Alcohol abuse in her maternal uncle; Alzheimer's disease in her maternal grandfather, maternal grandmother, and paternal grandmother; Anorexia nervosa in her sister; Anxiety disorder in her maternal uncle; Depression in her maternal grandmother, maternal uncle, and mother; Diabetes in her maternal grandmother and mother. SOCIAL HISTORY She  reports that she has been smoking cigarettes. She has been smoking an average of .25 packs  per day. She has never used smokeless tobacco. She reports current alcohol use of about 2.0 standard drinks per week. She reports that she does not use drugs.  Review of Systems: Review of Systems  Constitutional:  Negative for chills, diaphoresis, fever, malaise/fatigue and weight loss.  HENT: Negative.  Negative for congestion, hearing loss, sinus pain, sore throat and tinnitus.   Eyes:  Negative for blurred vision and double vision.  Respiratory: Negative.  Negative for cough, hemoptysis, sputum production, shortness of breath and wheezing.   Cardiovascular: Negative.  Negative for chest pain, palpitations and leg swelling.  Gastrointestinal:  Negative for abdominal pain, blood in stool, constipation, diarrhea, heartburn, melena, nausea and vomiting.  Genitourinary: Negative.  Negative for dysuria and urgency.  Musculoskeletal: Negative.  Negative for back pain, falls, joint pain, myalgias and neck pain.  Skin: Negative.  Negative for rash.  Neurological: Negative.  Negative for dizziness, tingling, tremors, weakness and headaches.  Endo/Heme/Allergies:  Does not bruise/bleed easily.  Psychiatric/Behavioral:  Negative for depression, hallucinations, memory loss, substance abuse and suicidal ideas. The patient is not nervous/anxious and does not have insomnia.    Physical Exam: Estimated body mass index is 22.52 kg/m as calculated from the following:   Height as of this encounter: '5\' 7"'$  (1.702 m).   Weight as of this encounter: 143 lb 12.8 oz (65.2 kg). BP 120/90   Pulse 96   Temp 97.7 F (36.5 C)   Ht '5\' 7"'$  (1.702 m)   Wt 143 lb 12.8 oz (65.2 kg)   LMP 08/23/2020   SpO2 98%   BMI 22.52 kg/m  General Appearance: Well nourished, in no apparent distress.  Eyes: PERRLA, EOMs, conjunctiva no swelling or erythema, normal fundi and vessels.  Sinuses: No Frontal/maxillary tenderness  ENT/Mouth: Ext aud canals clear, normal light reflex with TMs without erythema, bulging. Good dentition.  No erythema, swelling, or exudate on post pharynx. Tonsils not swollen or erythematous. Hearing normal. Crowded mouth.  Neck: Supple, thyroid normal. No bruits  Respiratory: Respiratory effort normal, BS equal bilaterally without rales, rhonchi, wheezing or stridor.  Cardio: RRR without murmurs, rubs or gallops. Prominent S2. Brisk peripheral pulses without edema.  Chest: symmetric, with normal excursions and percussion.  Abdomen: Soft, nontender, no guarding, rebound, hernias, masses, or organomegaly. .  Lymphatics: Non tender without lymphadenopathy.  Musculoskeletal: Full ROM all peripheral extremities,5/5 strength, and  normal gait.  Skin: Warm, dry without rashes, lesions, ecchymosis. Neuro: Cranial nerves intact, reflexes equal bilaterally. Normal muscle tone, no cerebellar symptoms. Sensation intact.  Psych: Awake and oriented X 3, normal affect, Insight and Judgment appropriate.  Breast/ GU: deferred to GYN EKG: Normal sinus rhythm  Dov Dill W Michelle Vanhise 10:24 AM [facility]

## 2021-03-13 ENCOUNTER — Ambulatory Visit (INDEPENDENT_AMBULATORY_CARE_PROVIDER_SITE_OTHER): Payer: 59 | Admitting: Nurse Practitioner

## 2021-03-13 ENCOUNTER — Other Ambulatory Visit: Payer: Self-pay

## 2021-03-13 ENCOUNTER — Encounter: Payer: Self-pay | Admitting: Nurse Practitioner

## 2021-03-13 ENCOUNTER — Encounter: Payer: 59 | Admitting: Adult Health Nurse Practitioner

## 2021-03-13 VITALS — BP 120/90 | HR 96 | Temp 97.7°F | Ht 67.0 in | Wt 143.8 lb

## 2021-03-13 DIAGNOSIS — I10 Essential (primary) hypertension: Secondary | ICD-10-CM

## 2021-03-13 DIAGNOSIS — Z136 Encounter for screening for cardiovascular disorders: Secondary | ICD-10-CM | POA: Diagnosis not present

## 2021-03-13 DIAGNOSIS — Z79899 Other long term (current) drug therapy: Secondary | ICD-10-CM

## 2021-03-13 DIAGNOSIS — G40909 Epilepsy, unspecified, not intractable, without status epilepticus: Secondary | ICD-10-CM

## 2021-03-13 DIAGNOSIS — E559 Vitamin D deficiency, unspecified: Secondary | ICD-10-CM

## 2021-03-13 DIAGNOSIS — Z0000 Encounter for general adult medical examination without abnormal findings: Secondary | ICD-10-CM | POA: Diagnosis not present

## 2021-03-13 DIAGNOSIS — E669 Obesity, unspecified: Secondary | ICD-10-CM

## 2021-03-13 DIAGNOSIS — F17200 Nicotine dependence, unspecified, uncomplicated: Secondary | ICD-10-CM

## 2021-03-13 DIAGNOSIS — E785 Hyperlipidemia, unspecified: Secondary | ICD-10-CM

## 2021-03-13 DIAGNOSIS — Z0001 Encounter for general adult medical examination with abnormal findings: Secondary | ICD-10-CM

## 2021-03-13 DIAGNOSIS — E039 Hypothyroidism, unspecified: Secondary | ICD-10-CM

## 2021-03-13 DIAGNOSIS — E1169 Type 2 diabetes mellitus with other specified complication: Secondary | ICD-10-CM

## 2021-03-13 DIAGNOSIS — F3341 Major depressive disorder, recurrent, in partial remission: Secondary | ICD-10-CM

## 2021-03-13 DIAGNOSIS — D509 Iron deficiency anemia, unspecified: Secondary | ICD-10-CM

## 2021-03-13 DIAGNOSIS — J439 Emphysema, unspecified: Secondary | ICD-10-CM

## 2021-03-13 NOTE — Patient Instructions (Signed)

## 2021-03-15 IMAGING — DX DG CHEST 2V
2 series · 2 of 2 positions shown · non-contrast
Comparison: None.

CLINICAL DATA: Smoker, cough.

EXAM:
CHEST - 2 VIEW

[dg chest 2 view (1 of 2)]
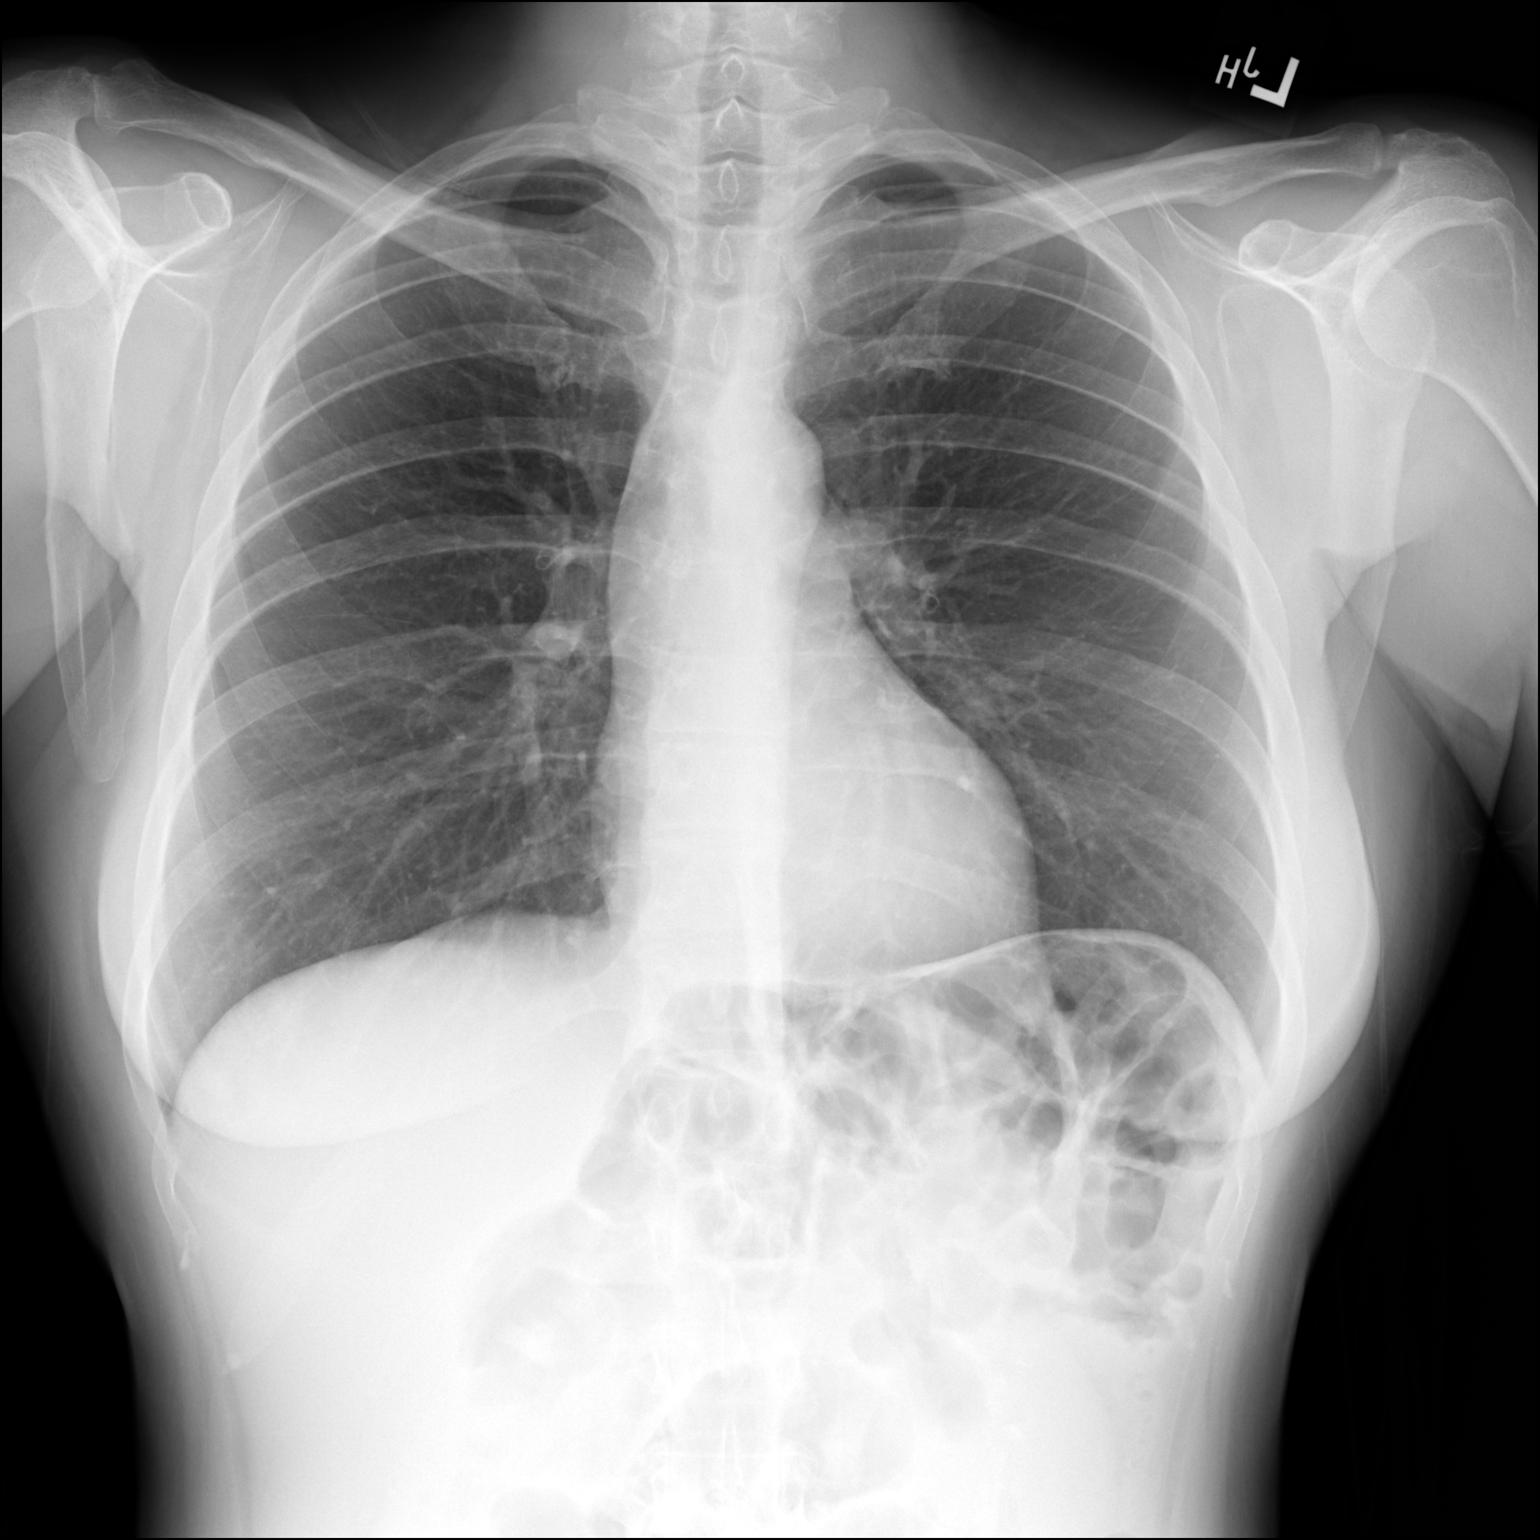

[dg chest 2 view (2 of 2)]
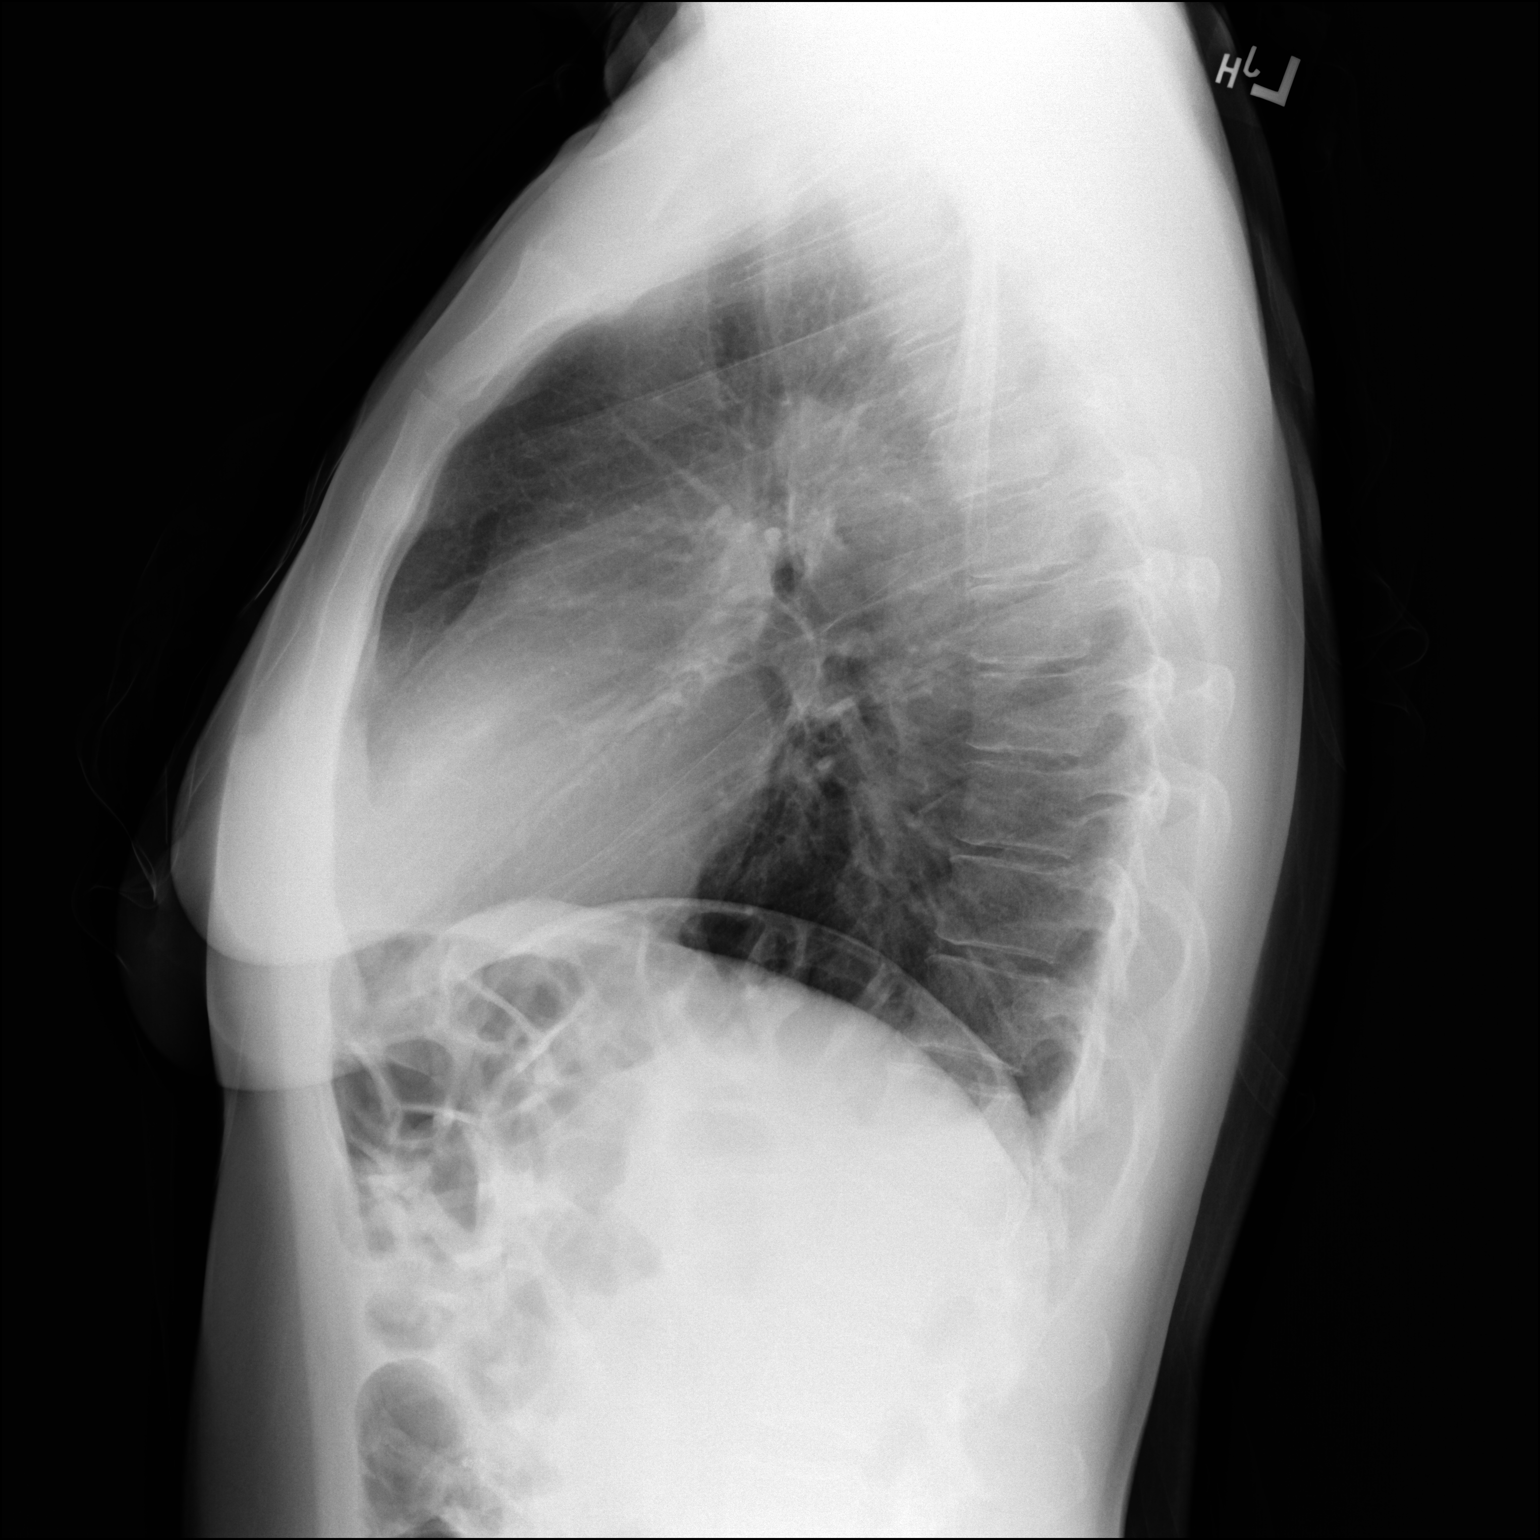

[2 of 2 positions shown; findings below may reference images not displayed]

FINDINGS: Trachea is midline. Heart size normal. Lungs are somewhat
hyperinflated but clear. No pleural fluid.
IMPRESSION: Mild hyperinflation without acute finding.

## 2021-03-24 LAB — CBC WITH DIFFERENTIAL/PLATELET
Basophils Absolute: 0.1 10*3/uL (ref 0.0–0.2)
Basos: 1 %
EOS (ABSOLUTE): 0.3 10*3/uL (ref 0.0–0.4)
Eos: 5 %
Hematocrit: 41.2 % (ref 34.0–46.6)
Hemoglobin: 14.1 g/dL (ref 11.1–15.9)
Immature Grans (Abs): 0 10*3/uL (ref 0.0–0.1)
Immature Granulocytes: 0 %
Lymphocytes Absolute: 2.7 10*3/uL (ref 0.7–3.1)
Lymphs: 38 %
MCH: 33.5 pg — ABNORMAL HIGH (ref 26.6–33.0)
MCHC: 34.2 g/dL (ref 31.5–35.7)
MCV: 98 fL — ABNORMAL HIGH (ref 79–97)
Monocytes Absolute: 0.6 10*3/uL (ref 0.1–0.9)
Monocytes: 9 %
Neutrophils Absolute: 3.3 10*3/uL (ref 1.4–7.0)
Neutrophils: 47 %
Platelets: 312 10*3/uL (ref 150–450)
RBC: 4.21 x10E6/uL (ref 3.77–5.28)
RDW: 12.8 % (ref 11.7–15.4)
WBC: 7 10*3/uL (ref 3.4–10.8)

## 2021-03-24 LAB — LIPID PANEL
Chol/HDL Ratio: 1.8 ratio (ref 0.0–4.4)
Cholesterol, Total: 202 mg/dL — ABNORMAL HIGH (ref 100–199)
HDL: 110 mg/dL (ref >39)
LDL Chol Calc (NIH): 80 mg/dL (ref 0–99)
Triglycerides: 66 mg/dL (ref 0–149)
VLDL Cholesterol Cal: 12 mg/dL (ref 5–40)

## 2021-03-24 LAB — IRON AND TIBC
Iron Saturation: 36 % (ref 15–55)
Iron: 96 ug/dL (ref 27–159)
Total Iron Binding Capacity: 266 ug/dL (ref 250–450)
UIBC: 170 ug/dL (ref 131–425)

## 2021-03-24 LAB — HEMOGLOBIN A1C
Est. average glucose Bld gHb Est-mCnc: 88 mg/dL
Hgb A1c MFr Bld: 4.7 % — ABNORMAL LOW (ref 4.8–5.6)

## 2021-03-24 LAB — FERRITIN: Ferritin: 98 ng/mL (ref 15–150)

## 2021-03-24 LAB — TSH: TSH: 0.065 u[IU]/mL — ABNORMAL LOW (ref 0.450–4.500)

## 2021-03-29 LAB — HM MAMMOGRAPHY

## 2021-04-12 ENCOUNTER — Other Ambulatory Visit: Payer: Self-pay | Admitting: Adult Health

## 2021-04-17 ENCOUNTER — Other Ambulatory Visit: Payer: Self-pay | Admitting: Nurse Practitioner

## 2021-04-17 DIAGNOSIS — E669 Obesity, unspecified: Secondary | ICD-10-CM

## 2021-04-17 MED ORDER — PHENTERMINE HCL 37.5 MG PO TABS: ORAL_TABLET | ORAL | 1 refills | Status: DC

## 2021-04-20 ENCOUNTER — Other Ambulatory Visit: Payer: Self-pay | Admitting: Obstetrics & Gynecology

## 2021-04-29 ENCOUNTER — Other Ambulatory Visit: Payer: Self-pay | Admitting: Adult Health

## 2021-05-09 ENCOUNTER — Ambulatory Visit: Payer: 59 | Admitting: Neurology

## 2021-05-23 ENCOUNTER — Ambulatory Visit: Payer: 59 | Admitting: Neurology

## 2021-05-23 ENCOUNTER — Encounter: Payer: Self-pay | Admitting: Neurology

## 2021-05-23 VITALS — BP 123/86 | HR 86 | Ht 67.0 in | Wt 148.0 lb

## 2021-05-23 DIAGNOSIS — G40309 Generalized idiopathic epilepsy and epileptic syndromes, not intractable, without status epilepticus: Secondary | ICD-10-CM | POA: Diagnosis not present

## 2021-05-23 MED ORDER — LAMOTRIGINE 100 MG PO TABS: 100.0000 mg | ORAL_TABLET | Freq: Two times a day (BID) | ORAL | 4 refills | Status: DC

## 2021-05-23 NOTE — Patient Instructions (Signed)
Increase Lamotrigine to 100 mg in the morning and 50 mg in the evening for one week  Then increase to 100 mg twice daily.  Return in 1 year

## 2021-05-23 NOTE — Progress Notes (Signed)
[facility]  PATIENT: Lisa [name] DOB: 12/09/1975  REFERRING CLINICIAN: Liane Comber, NP HISTORY FROM: Patient  REASON FOR VISIT: Seizure/Here to establish care    HISTORICAL  CHIEF COMPLAINT:  Chief Complaint  Patient presents with   Follow-up    Rm 13. Alone. Pt states lamotrigine is working well. She has not had any breakthrough seizures. Pt reports taking two tablets of lamotrigine 25 mg in the am, and two tablets of lamotrigine 25 mg in the evening. She would like to take lamotrigine once a day if possible. She is also requesting a note to get a [facility] at [facility].    INTERVAL HISTORY 05/23/2021:  Patient presents today for follow up. At last visit, plan was to switch her from Depakote to Lamotrigine due to abdominal pain. Denies any seizures since last visit, she has started Lamotrigine but only taking 50 mg BID and not 100 mg BID as previously recommended.  Denies any side effects from the medications. No other complaints other than requesting a letter to get body massages.    HISTORY OF PRESENT ILLNESS:  This is a 45 year old woman with long standing history of seizures who is presenting to establish care.  She reported she was first diagnosed with seizures at the age of 45.  Initially she was diagnosed with staring spell possibly absence seizure.  She was started on Depakote which controlled her seizures.  She has had her first tonic-clonic seizure was at the age of 45 when she was in college, this is in the setting of high stress and poor sleep.  Before this first tonic-clonic seizure she was having episodes of jerking of the right arm but these episode was not diagnosed. She mentioned that last year she was under a lot of stress, was drinking a lot and did have a tonic-clonic seizure. Previous to that,  her previous seizure was 16 years ago. Patient reported at one point in her life when she was trying to have a baby with her husband and she was  switched from Depakote to lamotrigine but due to high co-pay and not in a relationship anymore she was switched back to Depakote. Currently she is taking Depakote 750 mg twice a day.  Denies any side effect from medication.  Occasionally when she is stressed,  will have hand twich but no generalized seizures.    Handedness: Right handed   Seizure Type: Generalized seizure  Current frequency: one last year, prior to that was 16 years ago.   Any injuries from seizures: None   Seizure risk factors: No sz risk factors   Previous ASMs: Valproic acid/ Lamotrigine   Currenty ASMs: Valproic acid 750 mg BID   ASMs side effects: None  Brain Images: Reported ar normal previously   Previous EEGs: Reported as abnormal    OTHER MEDICAL CONDITIONS: Hypothyroidism, DM, Depression/Anxiety   REVIEW OF SYSTEMS: Full 14 system review of systems performed and negative with exception of: as noted in the HPI  ALLERGIES: Allergies  Allergen Reactions   Codeine Itching, Rash and Other (See Comments)    OPIOIDS - MORPHINE & RELATED    HOME MEDICATIONS: Outpatient Medications Prior to Visit  Medication Sig Dispense Refill   ADDERALL XR 10 MG 24 hr capsule Take 10 mg by mouth every morning.     diazepam (VALIUM) 5 MG tablet 1/2 to 1 daily as needed for severe anxiety 30 tablet 0   Esketamine HCl, 84 MG Dose, (SPRAVATO, 84 MG DOSE,) 28  MG/DEVICE SOPK Place into the nose every 14 (fourteen) days.     fluticasone (FLONASE) 50 MCG/ACT nasal spray PLACE 2 SPRAYS INTO BOTH NOSTRILS AT BEDTIME. 48 g 3   levothyroxine (SYNTHROID) 300 MCG tablet Take  1 tablet  Daily except 1/2 tab on Sunday. Take on an empty stomach with only water for 30 minutes & no Antacid meds, Calcium or Magnesium for 4 hours & avoid Biotin 90 tablet 3   meloxicam (MOBIC) 15 MG tablet Take one Daily with food for Pain & Inflammation 90 tablet 1   omeprazole (PRILOSEC) 20 MG capsule      OZEMPIC, 1 MG/DOSE, 4 MG/3ML SOPN INJECT  SUBCUTANEOUSLY 1MG   ONCE WEEKLY 9 mL 3   phentermine (ADIPEX-P) 37.5 MG tablet TAKE HALF TO ONE TABLET BY MOUTH DAILY AS NEEDED FOR APPETITE AND WEIGHT LOSS 30 tablet 1   SUMAtriptan (IMITREX) 100 MG tablet Take 1 tablet (100 mg total) by mouth once as needed for migraine. May repeat in 2 hours if headache persists or recurs. 10 tablet 0   traZODone (DESYREL) 50 MG tablet Take 1 tablet at Bedtime for Sleep 90 tablet 3   valACYclovir (VALTREX) 1000 MG tablet Take 1 tablet (1,000 mg total) by mouth 2 (two) times daily. For 10 days.  Start within 72 hours of symptom onset. 20 tablet 0   Vitamin D, Ergocalciferol, (DRISDOL) 1.25 MG (50000 UNIT) CAPS capsule Take  1 capsule  weekly  for Vitamin D Deficiency 13 capsule 3   Vortioxetine HBr (TRINTELLIX PO) Take by mouth.     lamoTRIgine (LAMICTAL) 25 MG tablet Take 1 tablet (25 mg total) by mouth daily. (Patient taking differently: Take 25 mg by mouth 4 (four) times daily. Patient reports taking two 25 mg tablets in the am and two 25 mg tablets in the evening.) 240 tablet 0   No facility-administered medications prior to visit.    PAST MEDICAL HISTORY: Past Medical History:  Diagnosis Date   Abdominal cramping 08/10/2011   Anxiety    Asthma    Depression    Habitual abortion history, antepartum    Headache(784.0)    History of diabetes mellitus    prior to gastric bypass in 2012   History of recurrent miscarriages, not currently pregnant 10/11/2011   Hypothyroidism    Infertility associated with anovulation    Obesity    BMi 33   Seizures ([place])    UTI (lower urinary tract infection) 08/14/2011    PAST SURGICAL HISTORY: Past Surgical History:  Procedure Laterality Date   DILATION AND CURETTAGE OF UTERUS     GASTRIC BYPASS  2012    FAMILY HISTORY: Family History  Problem Relation Age of Onset   Depression Mother    Diabetes Mother    Anorexia nervosa Sister    Alcohol abuse Maternal Uncle    Anxiety disorder Maternal Uncle     Depression Maternal Uncle    Alzheimer's disease Maternal Grandfather    Depression Maternal Grandmother    Alzheimer's disease Maternal Grandmother    Diabetes Maternal Grandmother    Alzheimer's disease Paternal Grandmother     SOCIAL HISTORY: Social History   Socioeconomic History   Marital status: Divorced    Spouse name: Not on file   Number of children: Not on file   Years of education: Not on file   Highest education level: Not on file  Occupational History   Not on file  Tobacco Use   Smoking status: Every Day  Packs/day: 0.25    Types: Cigarettes   Smokeless tobacco: Never  Vaping Use   Vaping Use: Former  Substance and Sexual Activity   Alcohol use: Yes    Alcohol/week: 2.0 standard drinks    Types: 2 Cans of beer per week    Comment: occassionally   Drug use: No   Sexual activity: Yes    Birth control/protection: Other-see comments    Comment: nuva ring  Other Topics Concern   Not on file  Social History Narrative   Lives with roomates   Right Handed   Drinks 1 monster evening twice a day, occasionally coffee twice a day, no soda.    Social Determinants of Health   Financial Resource Strain: Not on file  Food Insecurity: Not on file  Transportation Needs: Not on file  Physical Activity: Not on file  Stress: Not on file  Social Connections: Not on file  Intimate Partner Violence: Not on file     PHYSICAL EXAM  GENERAL EXAM/CONSTITUTIONAL: Vitals:  Vitals:   05/23/21 0950  BP: 123/86  Pulse: 86  Weight: 148 lb (67.1 kg)  Height: 5\' 7"  (1.702 m)   Body mass index is 23.18 kg/m. Wt Readings from Last 3 Encounters:  05/23/21 148 lb (67.1 kg)  03/13/21 143 lb 12.8 oz (65.2 kg)  02/15/21 150 lb (68 kg)   Patient is in no distress; well developed, nourished and groomed; neck is supple  EYES: Pupils round and reactive to light, Visual fields full to confrontation, Extraocular movements intacts,   MUSCULOSKELETAL: Gait, strength, tone,  movements noted in Neurologic exam below  NEUROLOGIC: MENTAL STATUS:  awake, alert, oriented to person, place and time recent and remote memory intact normal attention and concentration language fluent, comprehension intact, naming intact fund of knowledge appropriate  CRANIAL NERVE:  2nd, 3rd, 4th, 6th - pupils equal and reactive to light, visual fields full to confrontation, extraocular muscles intact, no nystagmus 5th - facial sensation symmetric 7th - facial strength symmetric 8th - hearing intact 9th - palate elevates symmetrically, uvula midline 11th - shoulder shrug symmetric 12th - tongue protrusion midline  MOTOR:  normal bulk and tone, full strength in the BUE, BLE  SENSORY:  normal and symmetric to light touch, pinprick, temperature, vibration  COORDINATION:  finger-nose-finger, fine finger movements normal  REFLEXES:  deep tendon reflexes present and symmetric  GAIT/STATION:  normal     DIAGNOSTIC DATA (LABS, IMAGING, TESTING) - I reviewed patient records, labs, notes, testing and imaging myself where available.  Lab Results  Component Value Date   WBC 7.0 03/23/2021   HGB 14.1 03/23/2021   HCT 41.2 03/23/2021   MCV 98 (H) 03/23/2021   PLT 312 03/23/2021      Component Value Date/Time   NA 138 02/15/2021 1553   K 4.4 02/15/2021 1553   CL 99 02/15/2021 1553   CO2 25 02/15/2021 1553   GLUCOSE 102 (H) 02/15/2021 1553   BUN 11 02/15/2021 1553   CREATININE 0.72 02/15/2021 1553   CALCIUM 9.4 02/15/2021 1553   PROT 7.2 02/15/2021 1553   ALBUMIN 4.4 02/15/2021 1553   AST 54 (H) 02/15/2021 1553   ALT 46 (H) 02/15/2021 1553   ALKPHOS 91 02/15/2021 1553   BILITOT 0.8 02/15/2021 1553   GFRNONAA 108 01/19/2020 1538   GFRAA 125 01/19/2020 1538   Lab Results  Component Value Date   CHOL 202 (H) 03/23/2021   HDL 110 03/23/2021   LDLCALC 80 03/23/2021   TRIG 66  03/23/2021   Lab Results  Component Value Date   HGBA1C 4.7 (L) 03/23/2021   Lab  Results  Component Value Date   VITAMINB12 1,513 (H) 01/19/2020   Lab Results  Component Value Date   TSH 0.065 (L) 03/23/2021     Brain images and EEG not available for review.   ASSESSMENT AND PLAN  45 y.o. year old female with past medical history of anxiety depression hypothyroidism and generalized epilepsy who is presenting for follow up. No seizure since last visit. Currently she is on Lamotrigine 50 mg BID instead of 100 mg BID as previously discussed. We will increase to 100 mg and obtain a level. Doing well overall,  no seizures, no side effects from the medications.    1. Generalized idiopathic epilepsy and epileptic syndromes, not intractable, without status epilepticus ([place])      PLAN: Increase Lamotrigine to 100 mg in the morning and 50 mg in the evening for one week  Then increase to 100 mg twice daily.  Return in 1 year     Per [place] statutes, patients with seizures are not allowed to drive until they have been seizure-free for six months.  Other recommendations include using caution when using heavy equipment or power tools. Avoid working on ladders or at heights. Take showers instead of baths.  Do not swim alone.  Ensure the water temperature is not too high on the home water heater. Do not go swimming alone. Do not lock yourself in a room alone (i.e. bathroom). When caring for infants or small children, sit down when holding, feeding, or changing them to minimize risk of injury to the child in the event you have a seizure. Maintain good sleep hygiene. Avoid alcohol.  Also recommend adequate sleep, hydration, good diet and minimize stress.   During the Seizure  - First, ensure adequate ventilation and place patients on the floor on their left side  Loosen clothing around the neck and ensure the airway is patent. If the patient is clenching the teeth, do not force the mouth open with any object as this can cause severe damage - Remove all items from the  surrounding that can be hazardous. The patient may be oblivious to what's happening and may not even know what he or she is doing. If the patient is confused and wandering, either gently guide him/her away and block access to outside areas - Reassure the individual and be comforting - Call 911. In most cases, the seizure ends before EMS arrives. However, there are cases when seizures may last over 3 to 5 minutes. Or the individual may have developed breathing difficulties or severe injuries. If a pregnant patient or a person with diabetes develops a seizure, it is prudent to call an ambulance. - Finally, if the patient does not regain full consciousness, then call EMS. Most patients will remain confused for about 45 to 90 minutes after a seizure, so you must use judgment in calling for help. - Avoid restraints but make sure the patient is in a bed with padded side rails - Place the individual in a lateral position with the neck slightly flexed; this will help the saliva drain from the mouth and prevent the tongue from falling backward - Remove all nearby furniture and other hazards from the area - Provide verbal assurance as the individual is regaining consciousness - Provide the patient with privacy if possible - Call for help and start treatment as ordered by the caregiver  After the Seizure (Postictal Stage)  After a seizure, most patients experience confusion, fatigue, muscle pain and/or a headache. Thus, one should permit the individual to sleep. For the next few days, reassurance is essential. Being calm and helping reorient the person is also of importance.  Most seizures are painless and end spontaneously. Seizures are not harmful to others but can lead to complications such as stress on the lungs, brain and the heart. Individuals with prior lung problems may develop labored breathing and respiratory distress.     Orders Placed This Encounter  Procedures   Lamotrigine level     Meds  ordered this encounter  Medications   lamoTRIgine (LAMICTAL) 100 MG tablet    Sig: Take 1 tablet (100 mg total) by mouth 2 (two) times daily.    Dispense:  180 tablet    Refill:  4     Return in about 1 year (around 05/23/2022).    Alric Ran, MD 05/23/2021, 10:22 AM  [facility] [address] [place], [place] [address] [phone]

## 2021-06-09 ENCOUNTER — Encounter (HOSPITAL_COMMUNITY): Payer: Self-pay

## 2021-06-14 ENCOUNTER — Other Ambulatory Visit: Payer: Self-pay | Admitting: Obstetrics & Gynecology

## 2021-06-14 NOTE — Progress Notes (Signed)
Surgical Instructions    Your procedure is scheduled on Thursday, December 29th, 2022.   Report to [facility] Main Entrance "A" at 06:30 A.M., then check in with the Admitting office.  Call this number if you have problems the morning of surgery:  [phone]   If you have any questions prior to your surgery date call [phone]: Open Monday-Friday 8am-4pm    Remember:  Do not eat or drink after midnight the night before your surgery    Take these medicines the morning of surgery with A SIP OF WATER:  lamoTRIgine (LAMICTAL) levothyroxine (SYNTHROID)    If needed:   acetaminophen (TYLENOL)  diazepam (VALIUM) fexofenadine (ALLEGRA) loratadine (CLARITIN) tetrahydrozoline - eye drops  Do not take Semaglutide (OZEMPIC) the day of surgery  As of today, STOP taking any Aspirin (unless otherwise instructed by your surgeon) Aleve, Naproxen, Ibuprofen, Motrin, Advil, Goody's, BC's, all herbal medications, fish oil, and all vitamins.   After your COVID test   You are not required to quarantine however you are required to wear a well-fitting mask when you are out and around people not in your household.  If your mask becomes wet or soiled, replace with a new one.  Wash your hands often with soap and water for 20 seconds or clean your hands with an alcohol-based hand sanitizer that contains at least 60% alcohol.  Do not share personal items.  Notify your provider: if you are in close contact with someone who has COVID  or if you develop a fever of 100.4 or greater, sneezing, cough, sore throat, shortness of breath or body aches.    The day of surgery:          Do not wear jewelry or makeup Do not wear lotions, powders, perfumes, or deodorant. Do not shave 48 hours prior to surgery.   Do not bring valuables to the hospital. DO Not wear nail polish, gel polish, artificial nails, or any other type of covering on natural nails including finger and toenails. If patients have  artificial nails, gel coating, etc. that need to be removed by a nail salon, please have this removed prior to surgery or surgery may need to be canceled/delayed if the surgeon/ anesthesia feels like the patient is unable to be adequately monitored.              [facility] is not responsible for any belongings or valuables.  Do NOT Smoke (Tobacco/Vaping)  24 hours prior to your procedure  If you use a CPAP at night, you may bring your mask for your overnight stay.   Contacts, glasses, hearing aids, dentures or partials may not be worn into surgery, please bring cases for these belongings   For patients admitted to the hospital, discharge time will be determined by your treatment team.   Patients discharged the day of surgery will not be allowed to drive home, and someone needs to stay with them for 24 hours.  NO VISITORS WILL BE ALLOWED IN PRE-OP WHERE PATIENTS ARE PREPPED FOR SURGERY.  ONLY 1 SUPPORT PERSON MAY BE PRESENT IN THE WAITING ROOM WHILE YOU ARE IN SURGERY.  IF YOU ARE TO BE ADMITTED, ONCE YOU ARE IN YOUR ROOM YOU WILL BE ALLOWED TWO (2) VISITORS. 1 (ONE) VISITOR MAY STAY OVERNIGHT BUT MUST ARRIVE TO THE ROOM BY 8pm.  Minor children may have two parents present. Special consideration for safety and communication needs will be reviewed on a case by case basis.  Special instructions:  Oral Hygiene is also important to reduce your risk of infection.  Remember - BRUSH YOUR TEETH THE MORNING OF SURGERY WITH YOUR REGULAR TOOTHPASTE   [place]- Preparing For Surgery  Before surgery, you can play an important role. Because skin is not sterile, your skin needs to be as free of germs as possible. You can reduce the number of germs on your skin by washing with CHG (chlorahexidine gluconate) Soap before surgery.  CHG is an antiseptic cleaner which kills germs and bonds with the skin to continue killing germs even after washing.     Please do not use if you have an allergy to CHG or  antibacterial soaps. If your skin becomes reddened/irritated stop using the CHG.  Do not shave (including legs and underarms) for at least 48 hours prior to first CHG shower. It is OK to shave your face.  Please follow these instructions carefully.     Shower the NIGHT BEFORE SURGERY and the MORNING OF SURGERY with CHG Soap.   If you chose to wash your hair, wash your hair first as usual with your normal shampoo. After you shampoo, rinse your hair and body thoroughly to remove the shampoo.  Then [employer] and genitals (private parts) with your normal soap and rinse thoroughly to remove soap.  After that Use CHG Soap as you would any other liquid soap. You can apply CHG directly to the skin and wash gently with a scrungie or a clean washcloth.   Apply the CHG Soap to your body ONLY FROM THE NECK DOWN.  Do not use on open wounds or open sores. Avoid contact with your eyes, ears, mouth and genitals (private parts). Wash Face and genitals (private parts)  with your normal soap.   Wash thoroughly, paying special attention to the area where your surgery will be performed.  Thoroughly rinse your body with warm water from the neck down.  DO NOT shower/wash with your normal soap after using and rinsing off the CHG Soap.  Pat yourself dry with a CLEAN TOWEL.  Wear CLEAN PAJAMAS to bed the night before surgery  Place CLEAN SHEETS on your bed the night before your surgery  DO NOT SLEEP WITH PETS.   Day of Surgery:  Take a shower with CHG soap. Wear Clean/Comfortable clothing the morning of surgery Do not apply any deodorants/lotions.   Remember to brush your teeth WITH YOUR REGULAR TOOTHPASTE.   Please read over the following fact sheets that you were given.

## 2021-06-15 ENCOUNTER — Encounter (HOSPITAL_COMMUNITY): Payer: Self-pay

## 2021-06-15 ENCOUNTER — Other Ambulatory Visit: Payer: Self-pay

## 2021-06-15 ENCOUNTER — Encounter (HOSPITAL_COMMUNITY)
Admission: RE | Admit: 2021-06-15 | Discharge: 2021-06-15 | Disposition: A | Discharge status: Home / Self Care | Payer: 59 | Source: Ambulatory Visit | Attending: Obstetrics & Gynecology | Admitting: Obstetrics & Gynecology

## 2021-06-15 DIAGNOSIS — Z01812 Encounter for preprocedural laboratory examination: Secondary | ICD-10-CM | POA: Diagnosis present

## 2021-06-15 HISTORY — DX: Headache, unspecified: R51.9

## 2021-06-15 HISTORY — DX: Anemia, unspecified: D64.9

## 2021-06-15 HISTORY — DX: Epilepsy, unspecified, not intractable, without status epilepticus: G40.909

## 2021-06-15 LAB — GLUCOSE, CAPILLARY: Glucose-Capillary: 102 mg/dL — ABNORMAL HIGH (ref 70–99)

## 2021-06-15 NOTE — Progress Notes (Addendum)
PCP - Liane Comber, NP with [facility]  Chest x-ray - Not indicated EKG - Not indicated  Sleep Study - No OSA  COVID TEST- 06/20/21   Anesthesia review: No  Patient denies shortness of breath, fever, cough and chest pain at PAT appointment   All instructions explained to the patient, with a verbal understanding of the material. Patient agrees to go over the instructions while at home for a better understanding. Patient also instructed to wear a mask while in pubic after being tested for COVID-19. The opportunity to ask questions was provided.  Patient wanted to get her own labs for CBC and BMP through [employer] where she works. She can get them done for free there.  She also asked to get surgical pathology there.  I told her she would need to speak to her surgeon.  I informed her that if she was going to get her labs at an outside source ([facility]) that we would need a copy by Tuesday to review.  She will either fax them or bring them by Tuesday when she gets her Covid test. We did draw the type and screen here at [facility].

## 2021-06-16 ENCOUNTER — Encounter: Payer: Self-pay | Admitting: Neurology

## 2021-06-16 ENCOUNTER — Encounter: Payer: Self-pay | Admitting: Hematology

## 2021-06-16 ENCOUNTER — Other Ambulatory Visit (HOSPITAL_COMMUNITY)
Admission: RE | Admit: 2021-06-16 | Discharge: 2021-06-16 | Disposition: A | Discharge status: Home / Self Care | Payer: 59 | Source: Ambulatory Visit | Attending: Obstetrics & Gynecology | Admitting: Obstetrics & Gynecology

## 2021-06-16 DIAGNOSIS — Z01812 Encounter for preprocedural laboratory examination: Secondary | ICD-10-CM | POA: Insufficient documentation

## 2021-06-16 LAB — BASIC METABOLIC PANEL
Anion gap: 8 (ref 5–15)
BUN: 8 mg/dL (ref 6–20)
CO2: 25 mmol/L (ref 22–32)
Calcium: 9.4 mg/dL (ref 8.9–10.3)
Chloride: 107 mmol/L (ref 98–111)
Creatinine, Ser: 0.56 mg/dL (ref 0.44–1.00)
GFR, Estimated: >60 mL/min (ref >60)
Glucose, Bld: 100 mg/dL — ABNORMAL HIGH (ref 70–99)
Potassium: 4.2 mmol/L (ref 3.5–5.1)
Sodium: 140 mmol/L (ref 135–145)

## 2021-06-16 LAB — CBC
HCT: 40.9 % (ref 36.0–46.0)
Hemoglobin: 13.9 g/dL (ref 12.0–15.0)
MCH: 33.7 pg (ref 26.0–34.0)
MCHC: 34 g/dL (ref 30.0–36.0)
MCV: 99.3 fL (ref 80.0–100.0)
Platelets: 293 10*3/uL (ref 150–400)
RBC: 4.12 MIL/uL (ref 3.87–5.11)
RDW: 11.6 % (ref 11.5–15.5)
WBC: 7.5 10*3/uL (ref 4.0–10.5)
nRBC: 0 % (ref 0.0–0.2)

## 2021-06-16 NOTE — Progress Notes (Addendum)
Patient called back wanting to reschedule her Covid test.  She is now scheduled for 1345 on 06/20/21.  She also asked about the CBC and BMP blood work being done with [place] if she is unable to get her surgeon to order through [facility] (her employer who can do it no charge to her)  I told her to come in today by 1400 and we would be able to get the blood drawn at [facility] if she cannot get it done elsewhere.   1106 06/16/21 Patient arrived back to PAT to get her CBC and BMP.  Asking again about pathology from her surgery to be sent to [facility].  I referred her back to her surgeon for this question.

## 2021-06-20 ENCOUNTER — Other Ambulatory Visit (HOSPITAL_COMMUNITY)
Admission: RE | Admit: 2021-06-20 | Discharge: 2021-06-20 | Disposition: A | Discharge status: Home / Self Care | Payer: 59 | Source: Ambulatory Visit | Attending: Obstetrics & Gynecology | Admitting: Obstetrics & Gynecology

## 2021-06-20 ENCOUNTER — Other Ambulatory Visit (HOSPITAL_COMMUNITY): Payer: 59

## 2021-06-20 DIAGNOSIS — Z01818 Encounter for other preprocedural examination: Secondary | ICD-10-CM

## 2021-06-20 DIAGNOSIS — Z01812 Encounter for preprocedural laboratory examination: Secondary | ICD-10-CM | POA: Diagnosis not present

## 2021-06-20 DIAGNOSIS — Z20822 Contact with and (suspected) exposure to covid-19: Secondary | ICD-10-CM | POA: Insufficient documentation

## 2021-06-20 LAB — SARS CORONAVIRUS 2 (TAT 6-24 HRS): SARS Coronavirus 2: NEGATIVE

## 2021-06-21 NOTE — Anesthesia Preprocedure Evaluation (Addendum)
Anesthesia Evaluation  Patient identified by MRN, date of birth, ID band Patient awake    Reviewed: Allergy & Precautions, NPO status , Patient's Chart, lab work & pertinent test results  Airway Mallampati: II  TM Distance: >3 FB Neck ROM: Full    Dental no notable dental hx.    Pulmonary asthma , COPD, Current Smoker,    Pulmonary exam normal        Cardiovascular negative cardio ROS   Rhythm:Regular Rate:Normal     Neuro/Psych  Headaches, Seizures -, Well Controlled,  Anxiety Depression    GI/Hepatic negative GI ROS, Neg liver ROS,   Endo/Other  Hypothyroidism   Renal/GU negative Renal ROS  Female GU complaint     Musculoskeletal negative musculoskeletal ROS (+)   Abdominal Normal abdominal exam  (+)   Peds  Hematology  (+) anemia ,   Anesthesia Other Findings   Reproductive/Obstetrics                            Anesthesia Physical Anesthesia Plan  ASA: 3  Anesthesia Plan: General   Post-op Pain Management: Tylenol PO (pre-op) and Celebrex PO (pre-op)   Induction: Intravenous  PONV Risk Score and Plan: 2 and Ondansetron, Dexamethasone, Midazolam, Treatment may vary due to age or medical condition and Scopolamine patch - Pre-op  Airway Management Planned: Mask and Oral ETT  Additional Equipment: None  Intra-op Plan:   Post-operative Plan: Extubation in OR  Informed Consent: I have reviewed the patients History and Physical, chart, labs and discussed the procedure including the risks, benefits and alternatives for the proposed anesthesia with the patient or authorized representative who has indicated his/her understanding and acceptance.     Dental advisory given  Plan Discussed with: CRNA  Anesthesia Plan Comments: (Lab Results      Component                Value               Date                      WBC                      7.5                 06/16/2021                 HGB                      13.9                06/16/2021                HCT                      40.9                06/16/2021                MCV                      99.3                06/16/2021                PLT  293                 06/16/2021           Lab Results      Component                Value               Date                      NA                       140                 06/16/2021                K                        4.2                 06/16/2021                CO2                      25                  06/16/2021                GLUCOSE                  100 (H)             06/16/2021                BUN                      8                   06/16/2021                CREATININE               0.56                06/16/2021                CALCIUM                  9.4                 06/16/2021                EGFR                     106                 02/15/2021                GFRNONAA                 >60                 06/16/2021          )       Anesthesia Quick Evaluation

## 2021-06-22 ENCOUNTER — Other Ambulatory Visit: Payer: Self-pay

## 2021-06-22 ENCOUNTER — Ambulatory Visit (HOSPITAL_COMMUNITY): Payer: 59 | Admitting: Physician Assistant

## 2021-06-22 ENCOUNTER — Encounter (HOSPITAL_COMMUNITY): Payer: Self-pay | Admitting: Obstetrics & Gynecology

## 2021-06-22 ENCOUNTER — Encounter (HOSPITAL_COMMUNITY)
Admission: RE | Disposition: A | Discharge status: Home / Self Care | Payer: Self-pay | Source: Ambulatory Visit | Attending: Obstetrics & Gynecology

## 2021-06-22 ENCOUNTER — Ambulatory Visit (HOSPITAL_COMMUNITY): Payer: 59 | Admitting: Anesthesiology

## 2021-06-22 ENCOUNTER — Observation Stay (HOSPITAL_COMMUNITY)
Admission: RE | Admit: 2021-06-22 | Discharge: 2021-06-23 | Disposition: A | Discharge status: Home / Self Care | Payer: 59 | Source: Ambulatory Visit | Attending: Obstetrics & Gynecology | Admitting: Obstetrics & Gynecology

## 2021-06-22 DIAGNOSIS — J45909 Unspecified asthma, uncomplicated: Secondary | ICD-10-CM | POA: Insufficient documentation

## 2021-06-22 DIAGNOSIS — D259 Leiomyoma of uterus, unspecified: Principal | ICD-10-CM | POA: Insufficient documentation

## 2021-06-22 DIAGNOSIS — F17210 Nicotine dependence, cigarettes, uncomplicated: Secondary | ICD-10-CM | POA: Diagnosis not present

## 2021-06-22 DIAGNOSIS — R102 Pelvic and perineal pain unspecified side: Secondary | ICD-10-CM | POA: Diagnosis present

## 2021-06-22 DIAGNOSIS — E119 Type 2 diabetes mellitus without complications: Secondary | ICD-10-CM | POA: Insufficient documentation

## 2021-06-22 DIAGNOSIS — Z79899 Other long term (current) drug therapy: Secondary | ICD-10-CM | POA: Insufficient documentation

## 2021-06-22 DIAGNOSIS — Z90710 Acquired absence of both cervix and uterus: Secondary | ICD-10-CM | POA: Diagnosis present

## 2021-06-22 DIAGNOSIS — N838 Other noninflammatory disorders of ovary, fallopian tube and broad ligament: Secondary | ICD-10-CM | POA: Diagnosis not present

## 2021-06-22 DIAGNOSIS — E039 Hypothyroidism, unspecified: Secondary | ICD-10-CM | POA: Diagnosis not present

## 2021-06-22 HISTORY — PX: TOTAL LAPAROSCOPIC HYSTERECTOMY WITH SALPINGECTOMY: SHX6742

## 2021-06-22 LAB — POCT PREGNANCY, URINE: Preg Test, Ur: NEGATIVE

## 2021-06-22 LAB — TYPE AND SCREEN
ABO/RH(D): O POS
ABO/RH(D): O POS
Antibody Screen: NEGATIVE
Antibody Screen: NEGATIVE

## 2021-06-22 LAB — ABO/RH: ABO/RH(D): O POS

## 2021-06-22 LAB — GLUCOSE, CAPILLARY: Glucose-Capillary: 119 mg/dL — ABNORMAL HIGH (ref 70–99)

## 2021-06-22 SURGERY — HYSTERECTOMY, TOTAL, LAPAROSCOPIC, WITH SALPINGECTOMY
Anesthesia: General | Site: Abdomen

## 2021-06-22 MED ORDER — LACTATED RINGERS IV SOLN: INTRAVENOUS | Status: DC

## 2021-06-22 MED ORDER — CEFAZOLIN SODIUM-DEXTROSE 2-4 GM/100ML-% IV SOLN
2.0000 g | INTRAVENOUS | Status: AC
  Administered 2021-06-22: 09:00:00 2 g via INTRAVENOUS
  Filled 2021-06-22: qty 100

## 2021-06-22 MED ORDER — MIDAZOLAM HCL 2 MG/2ML IJ SOLN
INTRAMUSCULAR | Status: AC
  Filled 2021-06-22: qty 2

## 2021-06-22 MED ORDER — PHENYLEPHRINE 40 MCG/ML (10ML) SYRINGE FOR IV PUSH (FOR BLOOD PRESSURE SUPPORT)
PREFILLED_SYRINGE | INTRAVENOUS | Status: AC
  Filled 2021-06-22: qty 10

## 2021-06-22 MED ORDER — KETOROLAC TROMETHAMINE 30 MG/ML IJ SOLN
30.0000 mg | Freq: Four times a day (QID) | INTRAMUSCULAR | Status: AC
  Administered 2021-06-22 – 2021-06-23 (×4): 30 mg via INTRAVENOUS
  Filled 2021-06-22 (×4): qty 1

## 2021-06-22 MED ORDER — FENTANYL CITRATE (PF) 100 MCG/2ML IJ SOLN
INTRAMUSCULAR | Status: AC
  Filled 2021-06-22: qty 2

## 2021-06-22 MED ORDER — ONDANSETRON HCL 4 MG/2ML IJ SOLN: 4.0000 mg | Freq: Four times a day (QID) | INTRAMUSCULAR | Status: DC | PRN

## 2021-06-22 MED ORDER — KETOROLAC TROMETHAMINE 30 MG/ML IJ SOLN
INTRAMUSCULAR | Status: AC
  Filled 2021-06-22: qty 1

## 2021-06-22 MED ORDER — IBUPROFEN 600 MG PO TABS: 600.0000 mg | ORAL_TABLET | Freq: Four times a day (QID) | ORAL | Status: DC

## 2021-06-22 MED ORDER — PHENYLEPHRINE 40 MCG/ML (10ML) SYRINGE FOR IV PUSH (FOR BLOOD PRESSURE SUPPORT)
PREFILLED_SYRINGE | INTRAVENOUS | Status: DC | PRN
  Administered 2021-06-22 (×2): 80 ug via INTRAVENOUS

## 2021-06-22 MED ORDER — DIPHENHYDRAMINE HCL 50 MG/ML IJ SOLN
INTRAMUSCULAR | Status: AC
  Filled 2021-06-22: qty 1

## 2021-06-22 MED ORDER — SODIUM CHLORIDE 0.9 % IR SOLN
Status: DC | PRN
  Administered 2021-06-22: 1000 mL

## 2021-06-22 MED ORDER — KETOROLAC TROMETHAMINE 30 MG/ML IJ SOLN
30.0000 mg | Freq: Once | INTRAMUSCULAR | Status: AC
  Administered 2021-06-22: 12:00:00 30 mg via INTRAVENOUS

## 2021-06-22 MED ORDER — DEXAMETHASONE SODIUM PHOSPHATE 10 MG/ML IJ SOLN
INTRAMUSCULAR | Status: AC
  Filled 2021-06-22: qty 1

## 2021-06-22 MED ORDER — SCOPOLAMINE 1 MG/3DAYS TD PT72
1.0000 | MEDICATED_PATCH | TRANSDERMAL | Status: DC
  Administered 2021-06-22: 08:00:00 1.5 mg via TRANSDERMAL
  Filled 2021-06-22: qty 1

## 2021-06-22 MED ORDER — PROPOFOL 10 MG/ML IV BOLUS
INTRAVENOUS | Status: DC | PRN
  Administered 2021-06-22: 150 mg via INTRAVENOUS

## 2021-06-22 MED ORDER — HEMOSTATIC AGENTS (NO CHARGE) OPTIME
TOPICAL | Status: DC | PRN
  Administered 2021-06-22: 1 via TOPICAL

## 2021-06-22 MED ORDER — DIPHENHYDRAMINE HCL 50 MG/ML IJ SOLN
25.0000 mg | Freq: Four times a day (QID) | INTRAMUSCULAR | Status: DC | PRN
  Filled 2021-06-22: qty 0.5

## 2021-06-22 MED ORDER — CELECOXIB 200 MG PO CAPS
200.0000 mg | ORAL_CAPSULE | Freq: Once | ORAL | Status: AC
  Administered 2021-06-22: 08:00:00 200 mg via ORAL
  Filled 2021-06-22: qty 1

## 2021-06-22 MED ORDER — DEXAMETHASONE SODIUM PHOSPHATE 10 MG/ML IJ SOLN
INTRAMUSCULAR | Status: DC | PRN
  Administered 2021-06-22: 5 mg via INTRAVENOUS

## 2021-06-22 MED ORDER — CHLORHEXIDINE GLUCONATE 0.12 % MT SOLN: 15.0000 mL | Freq: Once | OROMUCOSAL | Status: AC

## 2021-06-22 MED ORDER — ROCURONIUM BROMIDE 10 MG/ML (PF) SYRINGE
PREFILLED_SYRINGE | INTRAVENOUS | Status: AC
  Filled 2021-06-22: qty 10

## 2021-06-22 MED ORDER — DIPHENHYDRAMINE HCL 50 MG/ML IJ SOLN
INTRAMUSCULAR | Status: DC | PRN
  Administered 2021-06-22: 12.5 mg via INTRAVENOUS

## 2021-06-22 MED ORDER — ONDANSETRON HCL 4 MG PO TABS: 4.0000 mg | ORAL_TABLET | Freq: Four times a day (QID) | ORAL | Status: DC | PRN

## 2021-06-22 MED ORDER — METHYLENE BLUE 0.5 % INJ SOLN
INTRAVENOUS | Status: DC | PRN
  Administered 2021-06-22: 5 mL via INTRAVENOUS

## 2021-06-22 MED ORDER — SUGAMMADEX SODIUM 200 MG/2ML IV SOLN
INTRAVENOUS | Status: DC | PRN
  Administered 2021-06-22: 200 mg via INTRAVENOUS

## 2021-06-22 MED ORDER — 0.9 % SODIUM CHLORIDE (POUR BTL) OPTIME
TOPICAL | Status: DC | PRN
  Administered 2021-06-22: 10:00:00 1000 mL

## 2021-06-22 MED ORDER — FENTANYL CITRATE (PF) 250 MCG/5ML IJ SOLN
INTRAMUSCULAR | Status: DC | PRN
  Administered 2021-06-22: 100 ug via INTRAVENOUS
  Administered 2021-06-22 (×2): 50 ug via INTRAVENOUS

## 2021-06-22 MED ORDER — SIMETHICONE 80 MG PO CHEW
80.0000 mg | CHEWABLE_TABLET | Freq: Four times a day (QID) | ORAL | Status: DC
  Administered 2021-06-22 (×2): 80 mg via ORAL
  Filled 2021-06-22 (×2): qty 1

## 2021-06-22 MED ORDER — ROCURONIUM BROMIDE 10 MG/ML (PF) SYRINGE
PREFILLED_SYRINGE | INTRAVENOUS | Status: DC | PRN
  Administered 2021-06-22: 20 mg via INTRAVENOUS
  Administered 2021-06-22: 30 mg via INTRAVENOUS
  Administered 2021-06-22: 50 mg via INTRAVENOUS

## 2021-06-22 MED ORDER — GABAPENTIN 100 MG PO CAPS
100.0000 mg | ORAL_CAPSULE | Freq: Three times a day (TID) | ORAL | Status: DC
  Administered 2021-06-22 (×2): 100 mg via ORAL
  Filled 2021-06-22 (×2): qty 1

## 2021-06-22 MED ORDER — PANTOPRAZOLE SODIUM 40 MG IV SOLR
40.0000 mg | Freq: Every day | INTRAVENOUS | Status: DC
  Administered 2021-06-22: 22:00:00 40 mg via INTRAVENOUS
  Filled 2021-06-22: qty 40

## 2021-06-22 MED ORDER — ONDANSETRON HCL 4 MG/2ML IJ SOLN
INTRAMUSCULAR | Status: DC | PRN
  Administered 2021-06-22: 4 mg via INTRAVENOUS

## 2021-06-22 MED ORDER — MENTHOL 3 MG MT LOZG: 1.0000 | LOZENGE | OROMUCOSAL | Status: DC | PRN

## 2021-06-22 MED ORDER — HYDROMORPHONE HCL 1 MG/ML IJ SOLN
0.2000 mg | INTRAMUSCULAR | Status: DC | PRN
  Administered 2021-06-22 (×3): 0.5 mg via INTRAVENOUS
  Filled 2021-06-22 (×3): qty 1

## 2021-06-22 MED ORDER — LAMOTRIGINE 100 MG PO TABS
100.0000 mg | ORAL_TABLET | Freq: Two times a day (BID) | ORAL | Status: DC
  Administered 2021-06-22: 22:00:00 100 mg via ORAL
  Filled 2021-06-22: qty 1

## 2021-06-22 MED ORDER — METHYLENE BLUE 0.5 % INJ SOLN
INTRAVENOUS | Status: AC
  Filled 2021-06-22: qty 10

## 2021-06-22 MED ORDER — FENTANYL CITRATE (PF) 100 MCG/2ML IJ SOLN
25.0000 ug | INTRAMUSCULAR | Status: DC | PRN
  Administered 2021-06-22 (×5): 25 ug via INTRAVENOUS

## 2021-06-22 MED ORDER — BUPIVACAINE-EPINEPHRINE (PF) 0.25% -1:200000 IJ SOLN
INTRAMUSCULAR | Status: AC
  Filled 2021-06-22: qty 30

## 2021-06-22 MED ORDER — OXYCODONE HCL 5 MG PO TABS
5.0000 mg | ORAL_TABLET | ORAL | Status: DC | PRN
  Administered 2021-06-22: 18:00:00 10 mg via ORAL
  Administered 2021-06-23: 08:00:00 5 mg via ORAL
  Filled 2021-06-22: qty 2
  Filled 2021-06-22: qty 1

## 2021-06-22 MED ORDER — PROMETHAZINE HCL 25 MG/ML IJ SOLN: 6.2500 mg | INTRAMUSCULAR | Status: DC | PRN

## 2021-06-22 MED ORDER — ONDANSETRON HCL 4 MG/2ML IJ SOLN
INTRAMUSCULAR | Status: AC
  Filled 2021-06-22: qty 2

## 2021-06-22 MED ORDER — PROPOFOL 10 MG/ML IV BOLUS
INTRAVENOUS | Status: AC
  Filled 2021-06-22: qty 20

## 2021-06-22 MED ORDER — ACETAMINOPHEN 500 MG PO TABS
1000.0000 mg | ORAL_TABLET | Freq: Once | ORAL | Status: AC
  Administered 2021-06-22: 08:00:00 1000 mg via ORAL
  Filled 2021-06-22: qty 2

## 2021-06-22 MED ORDER — ACETAMINOPHEN 10 MG/ML IV SOLN: 1000.0000 mg | Freq: Once | INTRAVENOUS | Status: DC | PRN

## 2021-06-22 MED ORDER — BUPIVACAINE HCL (PF) 0.25 % IJ SOLN
INTRAMUSCULAR | Status: AC
  Filled 2021-06-22: qty 30

## 2021-06-22 MED ORDER — BUPIVACAINE HCL (PF) 0.25 % IJ SOLN
INTRAMUSCULAR | Status: DC | PRN
  Administered 2021-06-22: 17 mL
  Administered 2021-06-22: 10 mL

## 2021-06-22 MED ORDER — LIDOCAINE 2% (20 MG/ML) 5 ML SYRINGE
INTRAMUSCULAR | Status: AC
  Filled 2021-06-22: qty 5

## 2021-06-22 MED ORDER — ORAL CARE MOUTH RINSE: 15.0000 mL | Freq: Once | OROMUCOSAL | Status: AC

## 2021-06-22 MED ORDER — MIDAZOLAM HCL 2 MG/2ML IJ SOLN
INTRAMUSCULAR | Status: DC | PRN
  Administered 2021-06-22: 2 mg via INTRAVENOUS

## 2021-06-22 MED ORDER — LIDOCAINE 2% (20 MG/ML) 5 ML SYRINGE
INTRAMUSCULAR | Status: DC | PRN
  Administered 2021-06-22: 60 mg via INTRAVENOUS

## 2021-06-22 MED ORDER — CHLORHEXIDINE GLUCONATE 0.12 % MT SOLN
OROMUCOSAL | Status: AC
  Administered 2021-06-22: 08:00:00 15 mL via OROMUCOSAL
  Filled 2021-06-22: qty 15

## 2021-06-22 MED ORDER — ACETAMINOPHEN 500 MG PO TABS
1000.0000 mg | ORAL_TABLET | Freq: Four times a day (QID) | ORAL | Status: DC
  Administered 2021-06-22 – 2021-06-23 (×4): 1000 mg via ORAL
  Filled 2021-06-22 (×4): qty 2

## 2021-06-22 MED ORDER — POVIDONE-IODINE 10 % EX SWAB
2.0000 "application " | Freq: Once | CUTANEOUS | Status: AC
  Administered 2021-06-22: 2 via TOPICAL

## 2021-06-22 MED ORDER — FENTANYL CITRATE (PF) 250 MCG/5ML IJ SOLN
INTRAMUSCULAR | Status: AC
  Filled 2021-06-22: qty 5

## 2021-06-22 SURGICAL SUPPLY — 45 items
APPLICATOR ARISTA FLEXITIP XL (MISCELLANEOUS) ×2 IMPLANT
BARRIER ADHS 3X4 INTERCEED (GAUZE/BANDAGES/DRESSINGS) ×1 IMPLANT
CABLE HIGH FREQUENCY MONO STRZ (ELECTRODE) ×1 IMPLANT
COVER MAYO STAND STRL (DRAPES) ×3 IMPLANT
DERMABOND ADVANCED (GAUZE/BANDAGES/DRESSINGS) ×2
DERMABOND ADVANCED .7 DNX12 (GAUZE/BANDAGES/DRESSINGS) ×1 IMPLANT
DRSG COVADERM PLUS 2X2 (GAUZE/BANDAGES/DRESSINGS) ×2 IMPLANT
DURAPREP 26ML APPLICATOR (WOUND CARE) ×3 IMPLANT
FILTER SMOKE EVAC LAPAROSHD (FILTER) ×3 IMPLANT
GLOVE SURG ENC MOIS LTX SZ7 (GLOVE) ×3 IMPLANT
GLOVE SURG UNDER POLY LF SZ7 (GLOVE) ×12 IMPLANT
GOWN STRL REUS W/ TWL LRG LVL3 (GOWN DISPOSABLE) ×3 IMPLANT
GOWN STRL REUS W/TWL LRG LVL3 (GOWN DISPOSABLE) ×6
HEMOSTAT ARISTA ABSORB 3G PWDR (HEMOSTASIS) ×2 IMPLANT
HIBICLENS CHG 4% 4OZ BTL (MISCELLANEOUS) ×3 IMPLANT
IRRIGATION STRYKERFLOW (MISCELLANEOUS) ×1 IMPLANT
IRRIGATOR STRYKERFLOW (MISCELLANEOUS) ×3
KIT TURNOVER KIT B (KITS) ×3 IMPLANT
LIGASURE VESSEL 5MM BLUNT TIP (ELECTROSURGICAL) ×3 IMPLANT
MANIPULATOR ADVINCU DEL 3.5 PL (MISCELLANEOUS) ×2 IMPLANT
NS IRRIG 1000ML POUR BTL (IV SOLUTION) ×3 IMPLANT
PACK LAPAROSCOPY BASIN (CUSTOM PROCEDURE TRAY) ×3 IMPLANT
PACK TRENDGUARD 450 HYBRID PRO (MISCELLANEOUS) IMPLANT
PROTECTOR NERVE ULNAR (MISCELLANEOUS) ×6 IMPLANT
SCISSORS LAP 5X35 DISP (ENDOMECHANICALS) ×3 IMPLANT
SET CYSTO W/LG BORE CLAMP LF (SET/KITS/TRAYS/PACK) IMPLANT
SET TRI-LUMEN FLTR TB AIRSEAL (TUBING) ×2 IMPLANT
SLEEVE ENDOPATH XCEL 5M (ENDOMECHANICALS) ×3 IMPLANT
SUT DVC VLOC 180 0 12IN GS21 (SUTURE) ×3
SUT MNCRL AB 4-0 PS2 18 (SUTURE) ×4 IMPLANT
SUT VIC AB 0 CT1 18XCR BRD8 (SUTURE) IMPLANT
SUT VIC AB 0 CT1 8-18 (SUTURE)
SUT VIC AB 2-0 CT1 (SUTURE) ×4 IMPLANT
SUT VICRYL 0 UR6 27IN ABS (SUTURE) ×3 IMPLANT
SUTURE DVC VLC 180 0 12IN GS21 (SUTURE) IMPLANT
SYR 10ML LL (SYRINGE) IMPLANT
SYR 50ML LL SCALE MARK (SYRINGE) IMPLANT
SYR BULB IRRIG 60ML STRL (SYRINGE) ×3 IMPLANT
TOWEL GREEN STERILE FF (TOWEL DISPOSABLE) ×9 IMPLANT
TRAY FOLEY W/BAG SLVR 14FR (SET/KITS/TRAYS/PACK) ×3 IMPLANT
TRENDGUARD 450 HYBRID PRO PACK (MISCELLANEOUS) ×3
TROCAR XCEL NON-BLD 11X100MML (ENDOMECHANICALS) ×3 IMPLANT
TROCAR XCEL NON-BLD 5MMX100MML (ENDOMECHANICALS) ×3 IMPLANT
UNDERPAD 30X36 HEAVY ABSORB (UNDERPADS AND DIAPERS) ×3 IMPLANT
WARMER LAPAROSCOPE (MISCELLANEOUS) ×3 IMPLANT

## 2021-06-22 NOTE — Anesthesia Procedure Notes (Signed)
Procedure Name: Intubation Date/Time: 06/22/2021 9:07 AM Performed by: Trinna Post., CRNA Pre-anesthesia Checklist: Patient identified, Emergency Drugs available, Suction available, Timeout performed and Patient being monitored Patient Re-evaluated:Patient Re-evaluated prior to induction Oxygen Delivery Method: Circle system utilized Preoxygenation: Pre-oxygenation with 100% oxygen Induction Type: IV induction Ventilation: Mask ventilation without difficulty Laryngoscope Size: Mac and 3 Grade View: Grade I Tube type: Oral Tube size: 7.0 mm Number of attempts: 1 Airway Equipment and Method: Stylet Placement Confirmation: ETT inserted through vocal cords under direct vision, positive ETCO2 and breath sounds checked- equal and bilateral Secured at: 22 cm Tube secured with: Tape Dental Injury: Teeth and Oropharynx as per pre-operative assessment

## 2021-06-22 NOTE — H&P (Signed)
MD GYN HISTORY AND PHYSICAL  Admission Date: 06/22/2021  6:23 AM  Admit Diagnosis: Dysmenorrhea, abnormal uterine bleeding Patient Name: Lisa [name]        MRN#: [id number]  Subjective:    Patient is a 45 y.o. female [id number] presents for scheduled TLH BS cystoscopy.  The indications for procedure are abnormal uterine bleeding.   Pertinent Gynecological History: Menses:flow is excessive with use of 6 pads or tampons on heaviest days  Bleeding: dysfunctional uterine bleeding Contraception: none DES exposure: unknown Blood transfusions: none Sexually transmitted diseases: no past history Previous GYN Procedures:  none   Last mammogram: normal Date: 9/22 Last pap: abnormal: lsil  Date: 9/22 - colpo neg  OB History: G0, P0   Menstrual History: Menarche age: 74  Medical / Surgical History: Past medical history:  Past Medical History:  Diagnosis Date   Abdominal cramping 08/10/2011   Anemia    Anxiety    Asthma    Depression    Epilepsy ([place])    Habitual abortion history, antepartum    Headache    Headache(784.0)    History of diabetes mellitus    prior to gastric bypass in 2012   History of recurrent miscarriages, not currently pregnant 10/11/2011   Hypothyroidism    Infertility associated with anovulation    Obesity    BMi 33   Seizures (Ages)    UTI (lower urinary tract infection) 08/14/2011    Past surgical history:  Past Surgical History:  Procedure Laterality Date   DILATION AND CURETTAGE OF UTERUS     GASTRIC BYPASS  2012   GASTRIC BYPASS     WISDOM TOOTH EXTRACTION     Family History:  Family History  Problem Relation Age of Onset   Depression Mother    Diabetes Mother    Anorexia nervosa Sister    Alcohol abuse Maternal Uncle    Anxiety disorder Maternal Uncle    Depression Maternal Uncle    Alzheimer's disease Maternal Grandfather    Depression Maternal Grandmother    Alzheimer's disease Maternal Grandmother    Diabetes Maternal Grandmother     Alzheimer's disease Paternal Grandmother     Social History:  reports that she has been smoking cigarettes. She has been smoking an average of .5 packs per day. She has never used smokeless tobacco. She reports current alcohol use. She reports that she does not use drugs.  Allergies: Allergies  Allergen Reactions   Codeine Itching   Codeine Itching, Rash and Other (See Comments)    OPIOIDS - MORPHINE & RELATED     Current Medications at time of admission:  Prior to Admission medications   Medication Sig Start Date End Date Taking? Authorizing Provider  acetaminophen (TYLENOL) 500 MG tablet Take 1,000 mg by mouth every 6 (six) hours as needed (pain.).   Yes [provider]  ADDERALL XR 10 MG 24 hr capsule Take 10 mg by mouth every morning. 05/19/21  Yes [provider]  amphetamine-dextroamphetamine (ADDERALL XR) 10 MG 24 hr capsule Take 10 mg by mouth daily at 12 noon.   Yes [provider]  amphetamine-dextroamphetamine (ADDERALL XR) 20 MG 24 hr capsule Take 20 mg by mouth in the morning.   Yes [provider]  calcium carbonate (TUMS - DOSED IN MG ELEMENTAL CALCIUM) 500 MG chewable tablet Chew 1-2 tablets by mouth 3 (three) times daily as needed for indigestion or heartburn.   Yes [provider]  Cholecalciferol (VITAMIN D3 PO) Take 1 tablet by  mouth every evening.   Yes [provider]  diazepam (VALIUM) 5 MG tablet 1/2 to 1 daily as needed for severe anxiety 08/17/11  Yes Arfeen, Arlyce Harman, MD  diazepam (VALIUM) 5 MG tablet Take 5 mg by mouth 2 (two) times daily as needed for anxiety.   Yes [provider]  diclofenac Sodium (VOLTAREN) 1 % GEL Apply 1 application topically 3 (three) times daily as needed (arthritis pain (hands)).   Yes [provider]  Esketamine HCl, 84 MG Dose, (SPRAVATO, 84 MG DOSE,) 28 MG/DEVICE SOPK Place into the nose every 14 (fourteen) days.   Yes [provider]  Ferrous Sulfate (IRON  PO) Take 1 tablet by mouth daily at 12 noon.   Yes [provider]  fexofenadine (ALLEGRA) 180 MG tablet Take 180 mg by mouth daily as needed for allergies or rhinitis.   Yes [provider]  fluticasone (FLONASE) 50 MCG/ACT nasal spray PLACE 2 SPRAYS INTO BOTH NOSTRILS AT BEDTIME. 05/11/18  Yes Unk Pinto, MD  lamoTRIgine (LAMICTAL) 100 MG tablet Take 1 tablet (100 mg total) by mouth 2 (two) times daily. 05/23/21 08/21/21 Yes Alric Ran, MD  lamoTRIgine (LAMICTAL) 100 MG tablet Take 100 mg by mouth in the morning and at bedtime.   Yes [provider]  levothyroxine (SYNTHROID) 300 MCG tablet Take  1 tablet  Daily except 1/2 tab on Sunday. Take on an empty stomach with only water for 30 minutes & no Antacid meds, Calcium or Magnesium for 4 hours & avoid Biotin 12/14/20  Yes Unk Pinto, MD  levothyroxine (SYNTHROID) 300 MCG tablet Take 300 mcg by mouth daily before breakfast.   Yes [provider]  loratadine (CLARITIN) 10 MG tablet Take 10 mg by mouth daily as needed for allergies.   Yes [provider]  meloxicam (MOBIC) 15 MG tablet Take one Daily with food for Pain & Inflammation 04/29/21  Yes Unk Pinto, MD  meloxicam (MOBIC) 15 MG tablet Take 15 mg by mouth daily as needed for pain.   Yes [provider]  Menthol, Topical Analgesic, (ICY HOT EX) Apply 1 application topically 3 (three) times daily as needed (pain.).   Yes [provider]  Menthol-Camphor (TIGER BALM ARTHRITIS RUB EX) Apply 1-2 application topically 3 (three) times daily as needed (pain.).   Yes [provider]  naproxen sodium (ALEVE) 220 MG tablet Take 440 mg by mouth 2 (two) times daily as needed (pain.).   Yes [provider]  omeprazole (PRILOSEC) 20 MG capsule  05/01/11  Yes [provider]  OZEMPIC, 1 MG/DOSE, 4 MG/3ML SOPN INJECT SUBCUTANEOUSLY 1MG   ONCE WEEKLY 04/12/21  Yes Liane Comber, NP  phentermine (ADIPEX-P)  37.5 MG tablet TAKE HALF TO ONE TABLET BY MOUTH DAILY AS NEEDED FOR APPETITE AND WEIGHT LOSS 04/17/21  Yes Magda Bernheim, NP  Prenatal Vit-Fe Fumarate-FA (MULTIVITAMIN-PRENATAL) 27-0.8 MG TABS tablet Take 1 tablet by mouth in the morning.   Yes [provider]  Semaglutide (OZEMPIC, 1 MG/DOSE, [place]) Inject 1 mg into the skin every Friday.   Yes [provider]  SUMAtriptan (IMITREX) 100 MG tablet Take 1 tablet (100 mg total) by mouth once as needed for migraine. May repeat in 2 hours if headache persists or recurs. 11/02/20 11/03/21 Yes Liane Comber, NP  tetrahydrozoline 0.05 % ophthalmic solution 1-2 drops 3 (three) times daily as needed (allergy eyes).   Yes [provider]  traZODone (DESYREL) 50 MG tablet Take 1 tablet at Bedtime for  Sleep 04/14/19  Yes Unk Pinto, MD  traZODone (DESYREL) 50 MG tablet Take 50-100 mg by mouth at bedtime as needed for sleep.   Yes [provider]  Vitamin D, Ergocalciferol, (DRISDOL) 1.25 MG (50000 UNIT) CAPS capsule Take  1 capsule  weekly  for Vitamin D Deficiency 10/02/20  Yes Unk Pinto, MD  Vortioxetine HBr (TRINTELLIX PO) Take by mouth.   Yes [provider]  valACYclovir (VALTREX) 1000 MG tablet Take 1 tablet (1,000 mg total) by mouth 2 (two) times daily. For 10 days.  Start within 72 hours of symptom onset. 11/02/20   Liane Comber, NP    Review of Systems: Constitutional: Negative   HENT: Negative   Eyes: Negative   Respiratory: Negative   Cardiovascular: Negative   Gastrointestinal: Negative  Genitourinary: pos for vaginal bleeding   Musculoskeletal: Negative   Skin: Negative   Neurological: Negative   Endo/Heme/Allergies: Negative   Psychiatric/Behavioral: Negative      Objective:     Physical Exam: VS: Blood pressure (!) 142/89, pulse 99, temperature 98.8 F (37.1 C), temperature source Oral, resp. rate 17, height 5' 7.5" (1.715 m), weight 65.8 kg, SpO2 100 %. Physical  Exam General:   alert, cooperative, and no distress  Skin:   normal  Lungs:   clear to auscultation bilaterally  Heart:   regular rate and rhythm, S1, S2 normal, no murmur, click, rub or gallop  Abdomen:  soft, non-tender; bowel sounds normal; no masses,  no organomegaly  Pelvis:  Exam deferred.  Uterus: Defered to OR  Adnexa:   Cervix:    Labs / Imaging: Results for orders placed or performed during the hospital encounter of 06/22/21 (from the past 24 hour(s))  ABO/Rh     Status: None   Collection Time: 06/22/21  7:25 AM  Result Value Ref Range   ABO/RH(D)      O POS Performed at [facility] [address], [place], [place] [address]   Pregnancy, urine POC     Status: None   Collection Time: 06/22/21  7:40 AM  Result Value Ref Range   Preg Test, Ur NEGATIVE NEGATIVE  Type and screen [place]     Status: None   Collection Time: 06/22/21  7:48 AM  Result Value Ref Range   ABO/RH(D) O POS    Antibody Screen NEG    Sample Expiration      06/25/2021,2359 Performed at [facility], [place] [address] [place], [place] [address]    No results found.  [place] in office normal Assessment:        Patient is a 45 y.o. y.o G3P0030 with diagnosis of  abnormal uterine bleeding dysmenorrhea        Plan:    I had a lengthy discussion with the patient regarding her diagnosis. She was counseled about the procedure, risks, reasons, benefits and complications to include: injury to bowel, bladder, major blood vessel, ureter, bleeding, possibility of transfusion, infection, abnormal scar formation and the possibility of diagnosis of malignancy requiring further medical or surgical treatment. All the above was reviewed in detail.  All inquiries made by patient were answered.  Consent was signed, witnessed and placed into chart. Post op Instructions were reviewed, including office follow up.      Sanjuana Kava MD 06/22/2021, 8:47 AM

## 2021-06-22 NOTE — Transfer of Care (Signed)
Immediate Anesthesia Transfer of Care Note  Patient: Lisa [name]  Procedure(s) Performed: TOTAL LAPAROSCOPIC HYSTERECTOMY WITH BILATERAL SALPINGECTOMY (Abdomen)  Patient Location: PACU  Anesthesia Type:General  Level of Consciousness: drowsy  Airway & Oxygen Therapy: Patient Spontanous Breathing and Patient connected to nasal cannula oxygen  Post-op Assessment: Report given to RN and Post -op Vital signs reviewed and stable  Post vital signs: Reviewed and stable  Last Vitals:  Vitals Value Taken Time  BP 107/76 06/22/21 1142  Temp    Pulse 82 06/22/21 1144  Resp 14 06/22/21 1144  SpO2 100 % 06/22/21 1144  Vitals shown include unvalidated device data.  Last Pain:  Vitals:   06/22/21 0734  TempSrc:   PainSc: 3       Patients Stated Pain Goal: 3 (71/95/97 4718)  Complications: No notable events documented.

## 2021-06-22 NOTE — Anesthesia Postprocedure Evaluation (Signed)
Anesthesia Post Note  Patient: Krystalynn Ridgeway  Procedure(s) Performed: TOTAL LAPAROSCOPIC HYSTERECTOMY WITH BILATERAL SALPINGECTOMY (Abdomen)     Patient location during evaluation: PACU Anesthesia Type: General Level of consciousness: awake and alert Pain management: pain level controlled Vital Signs Assessment: post-procedure vital signs reviewed and stable Respiratory status: spontaneous breathing, nonlabored ventilation, respiratory function stable and patient connected to nasal cannula oxygen Cardiovascular status: blood pressure returned to baseline and stable Postop Assessment: no apparent nausea or vomiting Anesthetic complications: no   No notable events documented.  Last Vitals:  Vitals:   06/22/21 1315 06/22/21 1334  BP: 115/72 118/73  Pulse: 66 (!) 59  Resp: 12 16  Temp: (!) 36.3 C 37.1 C  SpO2: 98% 100%    Last Pain:  Vitals:   06/22/21 1334  TempSrc: Axillary  PainSc: 8                  Kaytlan Behrman P [employer] [employer]

## 2021-06-22 NOTE — Progress Notes (Signed)
Charts were merged and the MRN that was used to obtain original T&S was dropped. Redrew T&S under current MRN per blood bank.

## 2021-06-23 ENCOUNTER — Encounter: Payer: Self-pay | Admitting: Hematology

## 2021-06-23 ENCOUNTER — Other Ambulatory Visit (HOSPITAL_COMMUNITY): Payer: Self-pay

## 2021-06-23 DIAGNOSIS — D259 Leiomyoma of uterus, unspecified: Secondary | ICD-10-CM | POA: Diagnosis not present

## 2021-06-23 LAB — CBC
HCT: 34.5 % — ABNORMAL LOW (ref 36.0–46.0)
Hemoglobin: 11.9 g/dL — ABNORMAL LOW (ref 12.0–15.0)
MCH: 33.9 pg (ref 26.0–34.0)
MCHC: 34.5 g/dL (ref 30.0–36.0)
MCV: 98.3 fL (ref 80.0–100.0)
Platelets: 282 10*3/uL (ref 150–400)
RBC: 3.51 MIL/uL — ABNORMAL LOW (ref 3.87–5.11)
RDW: 11.6 % (ref 11.5–15.5)
WBC: 12.2 10*3/uL — ABNORMAL HIGH (ref 4.0–10.5)
nRBC: 0 % (ref 0.0–0.2)

## 2021-06-23 MED ORDER — GABAPENTIN 100 MG PO CAPS
100.0000 mg | ORAL_CAPSULE | Freq: Three times a day (TID) | ORAL | 3 refills | Status: DC | PRN
  Filled 2021-06-23: qty 30, 10d supply, fill #0

## 2021-06-23 MED ORDER — IBUPROFEN 800 MG PO TABS
800.0000 mg | ORAL_TABLET | Freq: Three times a day (TID) | ORAL | 3 refills | Status: DC | PRN
  Filled 2021-06-23: qty 60, 20d supply, fill #0

## 2021-06-23 MED ORDER — HYDROCODONE-ACETAMINOPHEN 5-325 MG PO TABS
1.0000 | ORAL_TABLET | ORAL | 0 refills | Status: DC | PRN
  Filled 2021-06-23: qty 30, 4d supply, fill #0

## 2021-06-23 NOTE — Op Note (Signed)
OPERATIVE NOTE  Perl Folmar  DOB:    [birth year]  MRN:    [id number]  CSN:    [id number]  Date of Surgery:  12/292022  Preoperative Diagnosis: Abnormal uterine bleeding Dysmenorrhea  Postoperative Diagnosis: same  Procedure: Total Laparoscopic hysterectomy Bilateral salpingectomy Cystoscopy  Surgeon: Mady Haagensen. Alwyn Pea, M.D.  Assistant: Waymon Amato, M.D.  Anesthetic: General  ETA  EBL: 25 cc  IVF:  1433mL LR  UOP:  450 mL Clear urine ( Foley)  Specimen:  Uterus, cervix, bilateral tubes   Complications:  None  Disposition: The patient presents with the above-mentioned diagnosis. She understands the indications for surgical procedure.  She also understands the alternative treatment options. She accepts the risk of, but not limited to, anesthetic complications, bleeding, infections, and possible damage to the surrounding organs.  Indication: 45 year old with long history of heavy vaginal bleeding during her menstruation and severe, debilitating menstrual cramps.  Patient's symptoms refractory to conservative medical management.   Findings: Laparoscopy: globular appearing uterus, normal appearing fallopian tubes and ovaries bilaterally. Old endometriosis scarring posterior cul de sac. Normal appendix, normal liver edge.  Cystoscopy: normal bladder urothelium, brisk jets from ureteral orifices bilaterally  Procedure: The patient was brought into the operating room with an intravenous line in placed, and anesthetic was administered. She was placed in a low dorsal lithotomy position using Allen stirrups. The patient's  abdomen was prepped with ChloraPrep. The perineum and vagina were prepped with multiple layers of Betadine the bladder catheterized with a foley.    A Time out was performed confirming the patient name and procedure. A weight speculum was placed in the posterior vagina and a Deaver retractor in the anterior vaginal. The anterior lip of the cervix was held with a  single tooth tenaculum. The uterus was sounded to 9cm  and dilated with [facility] dilators. The tenaculum was removed and the anterior lip of the cervix held with a stitch of 0-vicryl. 3.5 cm Advincula uterine manipulator was placed and the intrauterine balloon inflated. The  Speculum was removed. Surgeons gloves and gown were changed and attention turned to the abdomen.   The patient was sterilely draped. The subumbilical area was injected with half percent Marcaine.  An incision was made in the umbilicus with 11 blade scalpel after infiltration with 0.5% Marcaine. A 26mm Excel Visiport trocar was used with l3mm 0 degree laparoscope attached to perform direct entry into the patient's abdomen. Direct video visualization confirmed entry into the abdomen and pneumoperitoneum was obtained using approximately 3L CO2 gas. The patient was placed in steep Trendelenburg position. After infiltration with Marcaine, a small incision was made and a 5 mm trocar was inserted into the abdominal cavity along the left lower pelvis under direct visualization.  Another 36mm  trocar was placed along the mid upper left abdomen. A third 68mm trocar was placed along the  right lower pelvis. There was no injury noted with placement of trocars. The pelvic contents were visualized and findings noted above, both ureters were identified crossing the pelvic brim and pelvic sidewall coursing well below operative field.  Pictures were taken of the patient's pelvic structures.    The left fallopian tube was identified and followed to its fimbriated end. The proximal portion of the left fallopian tube was cauterized and cut. The left round ligament was cauterized and transected. The left utero-ovarian ligament was cauterized and cut. The bladder flap was developed by entering the anterior broad ligament.  The right fallopian tube was identified and  followed to its fimbriated end. The proximal portion of the right fallopian tube was cauterized and  cut. The right upper pedicle was then isolated. The right round ligament was then cauterized and transected via the Ligasure. The anterior broad ligament was then isolated and the the mesovarium was skeletonized. A bladder flap was mobilized by the endoshears attached to monopolar cautery and the peritoneum on the vesicouterine fold was incised to mobilize the bladder.The right utero-ovarian ligament was cauterized and cut. The bladder flap was developed further. Once the [facility] colpotomy ring was skeletonized and in position, the uterine arteries were sealed and transected using the 86mm Ligasure at the level of the colpotomy ring.  The uterine arteries were coagulated and transected bilaterally.   The vagina was transected using the monopolar endoshears resulting in separation of the uterus and attached tubes. The uterus, cervix and tubes were then delivered through the vagina. The vaginal occluder was insufflated with 60 cc of water and the vaginal vault was laparoscopically with 0-v-loc suture.  A modified colposuspension was performed by incorporating the uterosacral ligaments at the angles of the vaginal cuff.  The surgical technician confirmed that the vault was closed by placing a finger in the vagina and there were no notable defects. Irrigation was performed and the vaginal cuff and the pedicles were noted to be hemostatic.      A cystoscopy was then performed using a 70 degree cystoscopy and a complete bladder survey was performed. There was no noted injury to the bladder urothelium and there were brisk jets from both ureteral orifices.    The accessory ports were removed under direct laparoscopic guidance and  the pneumoperitoneum was released. The umbilical port was removed using laparoscopic guidance. The umbilical fascia was closed with an interrupted figure-of-eight stitch using 0 Vicryl on a UR-6. The skin was closed with subcuticular stitches using 4-0 Monocryl suture. Dermabond was used to  cover each incision site and op sites placed.  The final sponge, needle, and instrument counts were correct at the completion of the procedure. The patient tolerated the procedure well and was awakened and taken to the post anesthesia care unit in stable condition.     Sanjuana Kava, MD     Attending Attestation: I PERFORMED THE PROCEDURE AND MY ASSISTANT WAS NEEDED FOR THE COMPLEXITY OF THE CASE

## 2021-06-23 NOTE — Discharge Summary (Signed)
Physician Discharge Summary  Patient ID: Lisa [name] MRN: [id number] DOB/AGE: 45-Nov-1977 45 y.o.  Admit date: 06/22/2021 Discharge date: 06/23/2021  Admission Diagnoses: Abnormal uterine bleeding Dysmenorrhea  Discharge Diagnoses:  Principal Problem:   Pelvic pain Active Problems:   S/P laparoscopic hysterectomy   Discharged Condition: good  Hospital Course: Patient taken to operating room where below procedures were performed.  There were no intraoperative or post operative complications.  Patient progressed well in the hospital and on POD#1 was meeting all discharge criteria and was discharged home.    Consults: None  Significant Diagnostic Studies: labs: post op cbc  Treatments: IV hydration, antibiotics: Ancef, analgesia: acetaminophen, Dilaudid, and percocet, and surgery: Total laparoscopic hysterectomy , bilateral salpingectomy, cystoscopy  Discharge Exam: Blood pressure 111/72, pulse 71, temperature 98.2 F (36.8 C), temperature source Oral, resp. rate 19, height 5' 7.5" (1.715 m), weight 65.8 kg, SpO2 100 %. General appearance: alert, cooperative, and no distress Chest wall: no tenderness Cardio: regular rate and rhythm, S1, S2 normal, no murmur, click, rub or gallop GI: soft, non-tender; bowel sounds normal; no masses,  no organomegaly Pelvic: deferred Extremities: extremities normal, atraumatic, no cyanosis or edema, edema  , and Homans sign is negative, no sign of DVT Incision/Wound: clean dry intact  Disposition: Discharge disposition: 01-Home or Self Care       Discharge Instructions     Call MD for:   Complete by: As directed    Abnormal foul smelling vaginal discharge or heavy vaginal bleeding (soaking a pad an hour)   Call MD for:  difficulty breathing, headache or visual disturbances   Complete by: As directed    Call MD for:  extreme fatigue   Complete by: As directed    Call MD for:  hives   Complete by: As directed    Call MD for:   persistant dizziness or light-headedness   Complete by: As directed    Call MD for:  persistant nausea and vomiting   Complete by: As directed    Call MD for:  redness, tenderness, or signs of infection (pain, swelling, redness, odor or green/yellow discharge around incision site)   Complete by: As directed    Call MD for:  severe uncontrolled pain   Complete by: As directed    Call MD for:  temperature >100.4   Complete by: As directed    Diet - low sodium heart healthy   Complete by: As directed    Discharge instructions   Complete by: As directed    If you have not made a follow up visit please call [place] office at [phone] or if you have any questions or concerns. One week post op incision check and 4 weeks full post operative visit.  In case of Emergency call Dr. Alwyn Pea [phone]   Discharge wound care:   Complete by: As directed    Your incisions have skin glue which will dissolve over time.  You may shower, no baths. Don't use lotions or savs on incision sites.   Driving Restrictions   Complete by: As directed    No driving for 10 to 14 days or while taking narcotic medication   If the dressing is still on your incision site when you go home, remove it on the third day after your surgery date. Remove dressing if it begins to fall off, or if it is dirty or damaged before the third day.   Complete by: As directed    Increase activity slowly  Complete by: As directed    Lifting restrictions   Complete by: As directed    Don't lift anything heavier than 20 pounds   Other Restrictions   Complete by: As directed    Showers only no baths, pools, saunas      Allergies as of 06/23/2021       Reactions   Codeine Itching   Codeine Itching, Rash, Other (See Comments)   OPIOIDS - MORPHINE & RELATED        Medication List     STOP taking these medications    acetaminophen 500 MG tablet Commonly known as: TYLENOL   calcium carbonate 500 MG chewable  tablet Commonly known as: TUMS - dosed in mg elemental calcium   multivitamin-prenatal 27-0.8 MG Tabs tablet   naproxen sodium 220 MG tablet Commonly known as: ALEVE   Ozempic (1 MG/DOSE) 4 MG/3ML Sopn Generic drug: Semaglutide (1 MG/DOSE)   OZEMPIC (1 MG/DOSE) [place]   phentermine 37.5 MG tablet Commonly known as: ADIPEX-P   valACYclovir 1000 MG tablet Commonly known as: Valtrex       TAKE these medications    amphetamine-dextroamphetamine 10 MG 24 hr capsule Commonly known as: ADDERALL XR Take 10 mg by mouth daily at 12 noon.   amphetamine-dextroamphetamine 20 MG 24 hr capsule Commonly known as: ADDERALL XR Take 20 mg by mouth in the morning.   Adderall XR 10 MG 24 hr capsule Generic drug: amphetamine-dextroamphetamine Take 10 mg by mouth every morning.   diazepam 5 MG tablet Commonly known as: VALIUM Take 5 mg by mouth 2 (two) times daily as needed for anxiety.   diazepam 5 MG tablet Commonly known as: VALIUM 1/2 to 1 daily as needed for severe anxiety   diclofenac Sodium 1 % Gel Commonly known as: VOLTAREN Apply 1 application topically 3 (three) times daily as needed (arthritis pain (hands)).   fexofenadine 180 MG tablet Commonly known as: ALLEGRA Take 180 mg by mouth daily as needed for allergies or rhinitis.   fluticasone 50 MCG/ACT nasal spray Commonly known as: FLONASE PLACE 2 SPRAYS INTO BOTH NOSTRILS AT BEDTIME.   gabapentin 100 MG capsule Commonly known as: Neurontin Take 1 capsule (100 mg total) by mouth 3 (three) times daily as needed (nerve pain burning pain).   HYDROcodone-acetaminophen 5-325 MG tablet Commonly known as: NORCO/VICODIN Take 1-2 tablets by mouth every 4 (four) hours as needed for moderate pain or severe pain.   ibuprofen 800 MG tablet Commonly known as: ADVIL Take 1 tablet (800 mg total) by mouth every 8 (eight) hours as needed for cramping or moderate pain.   ICY HOT EX Apply 1 application topically 3 (three) times daily  as needed (pain.).   IRON PO Take 1 tablet by mouth daily at 12 noon.   lamoTRIgine 100 MG tablet Commonly known as: LaMICtal Take 1 tablet (100 mg total) by mouth 2 (two) times daily. What changed: Another medication with the same name was removed. Continue taking this medication, and follow the directions you see here.   levothyroxine 300 MCG tablet Commonly known as: SYNTHROID Take 300 mcg by mouth daily before breakfast.   levothyroxine 300 MCG tablet Commonly known as: SYNTHROID Take  1 tablet  Daily except 1/2 tab on Sunday. Take on an empty stomach with only water for 30 minutes & no Antacid meds, Calcium or Magnesium for 4 hours & avoid Biotin   loratadine 10 MG tablet Commonly known as: CLARITIN Take 10 mg by mouth daily as needed  for allergies.   meloxicam 15 MG tablet Commonly known as: MOBIC Take one Daily with food for Pain & Inflammation What changed: Another medication with the same name was removed. Continue taking this medication, and follow the directions you see here.   omeprazole 20 MG capsule Commonly known as: PRILOSEC   Spravato (84 MG Dose) 28 MG/DEVICE Sopk Generic drug: Esketamine HCl (84 MG Dose) Place into the nose every 14 (fourteen) days.   SUMAtriptan 100 MG tablet Commonly known as: IMITREX Take 1 tablet (100 mg total) by mouth once as needed for migraine. May repeat in 2 hours if headache persists or recurs.   tetrahydrozoline 0.05 % ophthalmic solution 1-2 drops 3 (three) times daily as needed (allergy eyes).   TIGER BALM ARTHRITIS RUB EX Apply 1-2 application topically 3 (three) times daily as needed (pain.).   traZODone 50 MG tablet Commonly known as: DESYREL Take 1 tablet at Bedtime for Sleep What changed: Another medication with the same name was removed. Continue taking this medication, and follow the directions you see here.   TRINTELLIX PO Take by mouth.   Vitamin D (Ergocalciferol) 1.25 MG (50000 UNIT) Caps  capsule Commonly known as: DRISDOL Take  1 capsule  weekly  for Vitamin D Deficiency   VITAMIN D3 PO Take 1 tablet by mouth every evening.               Discharge Care Instructions  (From admission, onward)           Start     Ordered   06/23/21 0000  If the dressing is still on your incision site when you go home, remove it on the third day after your surgery date. Remove dressing if it begins to fall off, or if it is dirty or damaged before the third day.        06/23/21 0909   06/23/21 0000  Discharge wound care:       Comments: Your incisions have skin glue which will dissolve over time.  You may shower, no baths. Don't use lotions or savs on incision sites.   06/23/21 2836             Signed: Rogelio Seen Herschel Fleagle 06/23/2021, 9:10 AM

## 2021-06-27 ENCOUNTER — Other Ambulatory Visit: Payer: Self-pay | Admitting: Nurse Practitioner

## 2021-06-27 DIAGNOSIS — E6609 Other obesity due to excess calories: Secondary | ICD-10-CM

## 2021-06-27 DIAGNOSIS — Z6833 Body mass index (BMI) 33.0-33.9, adult: Secondary | ICD-10-CM

## 2021-06-27 MED ORDER — PHENTERMINE HCL 37.5 MG PO TABS: ORAL_TABLET | ORAL | 0 refills | Status: DC

## 2021-06-29 ENCOUNTER — Encounter: Payer: Self-pay | Admitting: Hematology

## 2021-06-30 ENCOUNTER — Other Ambulatory Visit (HOSPITAL_COMMUNITY): Payer: Self-pay

## 2021-07-04 NOTE — Progress Notes (Signed)
3 FOLLOW UP  Assessment and Plan:  Type 2 diabetes mellitus with hyperlipidemia ([place]) Currently controlled without medication Eye Exam yearly and Dental Exam every 6 months. Dietary recommendations Physical Activity recommendations - A1C - lipid panel - CMP/GFR  Hypothyroidism, unspecified type -check TSH level, continue medications the same, reminded to take on an empty stomach 30-27mins before food.  - TSH  Seizure disorder (HCC) Continue medications, neuro following  Recurrent major depressive disorder, in partial remission ([place]) Continue to see psych  Hx of morbid obesity/ s/p gastric sleeve - BMI 23 Continue diet/exercise, uses phentermine PRN during menstrual cycles only (knows not to take on days taking adderal).   Vitamin D deficiency Continue supplement  Med management - CBC, CMP/GFR, magnesium   URI, acute Bactrim DS bid x 10 days Atrovent nasal spray Continue Mucinex for decongesting Push fluids   LAB DONE AT [facility], HAS RX FOR LABS AND WILL GET DONE Future Appointments  Date Time Provider [place]  03/13/2022 10:00 AM Magda Bernheim, NP [facility] None  05/23/2022 10:15 AM Alric Ran, MD [facility] None    HPI  46 y.o. female  presents for  follow up for chol, DM, seizures, anemia.  Works for [employer], gets labs there.   Has lifestyle controlled reflux, rare breakthrough.  She is having a lot of sinus congestion, has yellow /green nasal drainage.  Denies cough, myalgias, nausea, vomiting and diarrhea  She is currently being treated with Macrobid for a UTI.  Having frequency, dysuria.  Denies blood in urine and fevers.  She had TAH- LSO laparoscopic 10 days ago.  Pain is 3/10 currently.  Incisions are healing well   BMI is Body mass index is 22.9 kg/m., hx of morbid obesity, peak 260 lb, s/p gastric bypass in 2012, she has been working on diet and exercise, working out (lifting/body weight) and walking dogs 3-4 days Has new puppy, planning  to start walking. Continues to use Phentermine as needed.  Wt Readings from Last 3 Encounters:  07/06/21 148 lb 6.4 oz (67.3 kg)  06/22/21 145 lb (65.8 kg)  06/15/21 145 lb 12.8 oz (66.1 kg)   She had BP cuff but hasn't been checking, today their BP is BP: 98/60 She does workout, walking with dogs.  She denies chest pain, shortness of breath, dizziness.     She is not on cholesterol medication and denies myalgias. Her cholesterol is at goal. The cholesterol last visit was:   Lab Results  Component Value Date   CHOL 202 (H) 03/23/2021   HDL 110 03/23/2021   LDLCALC 80 03/23/2021   TRIG 66 03/23/2021   CHOLHDL 1.8 03/23/2021    She has been working on diet and exercise for diabetes  with hyperlipidemia not on medications due to well controlled A1C Without CKD No CAD, CHF, or PVD denies paresthesia of the feet, polydipsia, polyuria and visual disturbances.  Last A1C in the office was:  Lab Results  Component Value Date   HGBA1C 4.7 (L) 03/23/2021   Lab Results  Component Value Date   GFRNONAA >60 06/16/2021   Patient is on Vitamin D supplement, 50000 IU once a week.  Lab Results  Component Value Date   VD25OH 48.6 01/19/2020     She is on thyroid medication. Her medication was not changed last visit. Taking levothyroxine 300 mcg daily, takes on empty stomach.  Lab Results  Component Value Date   TSH 0.065 (L) 03/23/2021   Due to her gastric bypass she  has has severe symptomatic iron def anemia, has PRN iron infusions.   Lab Results  Component Value Date   IRON 96 03/23/2021   TIBC 266 03/23/2021   FERRITIN 98 03/23/2021   She has had epilepsy since she was 89, currently on Lamictal 100 mg BID. Should remain on medication. Follows with Dr. Tommy Medal. He checks levels.    Current Outpatient Medications on File Prior to Visit  Medication Sig   amphetamine-dextroamphetamine (ADDERALL XR) 20 MG 24 hr capsule Take 20 mg by mouth in the morning.   Cholecalciferol  (VITAMIN D3 PO) Take 1 tablet by mouth every evening.   diazepam (VALIUM) 5 MG tablet Take 5 mg by mouth 2 (two) times daily as needed for anxiety.   diclofenac Sodium (VOLTAREN) 1 % GEL Apply 1 application topically 3 (three) times daily as needed (arthritis pain (hands)).   Esketamine HCl, 84 MG Dose, (SPRAVATO, 84 MG DOSE,) 28 MG/DEVICE SOPK Place into the nose every 14 (fourteen) days.   Ferrous Sulfate (IRON PO) Take 1 tablet by mouth daily at 12 noon.   fexofenadine (ALLEGRA) 180 MG tablet Take 180 mg by mouth daily as needed for allergies or rhinitis.   fluticasone (FLONASE) 50 MCG/ACT nasal spray PLACE 2 SPRAYS INTO BOTH NOSTRILS AT BEDTIME.   gabapentin (NEURONTIN) 100 MG capsule Take 1 capsule (100 mg total) by mouth 3 (three) times daily as needed (nerve pain burning pain).   HYDROcodone-acetaminophen (NORCO/VICODIN) 5-325 MG tablet Take 1-2 tablets by mouth every 4 (four) hours as needed for moderate pain or severe pain.   ibuprofen (ADVIL) 800 MG tablet Take 1 tablet (800 mg total) by mouth every 8 (eight) hours as needed for cramping or moderate pain.   lamoTRIgine (LAMICTAL) 100 MG tablet Take 1 tablet (100 mg total) by mouth 2 (two) times daily.   levothyroxine (SYNTHROID) 300 MCG tablet Take  1 tablet  Daily except 1/2 tab on Sunday. Take on an empty stomach with only water for 30 minutes & no Antacid meds, Calcium or Magnesium for 4 hours & avoid Biotin   loratadine (CLARITIN) 10 MG tablet Take 10 mg by mouth daily as needed for allergies.   meloxicam (MOBIC) 15 MG tablet Take one Daily with food for Pain & Inflammation   Menthol-Camphor (TIGER BALM ARTHRITIS RUB EX) Apply 1-2 application topically 3 (three) times daily as needed (pain.).   omeprazole (PRILOSEC) 20 MG capsule    phentermine (ADIPEX-P) 37.5 MG tablet Take 1/2 to 1 tablet Daily for Dieting & Weight Loss   SUMAtriptan (IMITREX) 100 MG tablet Take 1 tablet (100 mg total) by mouth once as needed for migraine. May repeat  in 2 hours if headache persists or recurs.   tetrahydrozoline 0.05 % ophthalmic solution 1-2 drops 3 (three) times daily as needed (allergy eyes).   traZODone (DESYREL) 50 MG tablet Take 1 tablet at Bedtime for Sleep   Vitamin D, Ergocalciferol, (DRISDOL) 1.25 MG (50000 UNIT) CAPS capsule Take  1 capsule  weekly  for Vitamin D Deficiency   Vortioxetine HBr (TRINTELLIX PO) Take by mouth.   amphetamine-dextroamphetamine (ADDERALL XR) 10 MG 24 hr capsule Take 10 mg by mouth daily at 12 noon.   levothyroxine (SYNTHROID) 300 MCG tablet Take 300 mcg by mouth daily before breakfast.   Menthol, Topical Analgesic, (ICY HOT EX) Apply 1 application topically 3 (three) times daily as needed (pain.). (Patient not taking: Reported on 07/06/2021)   No current facility-administered medications on file prior to visit.  Medical History:  Past Medical History:  Diagnosis Date   Abdominal cramping 08/10/2011   Anemia    Anxiety    Asthma    Depression    Epilepsy ([place])    Habitual abortion history, antepartum    Headache    Headache(784.0)    History of diabetes mellitus    prior to gastric bypass in 2012   History of recurrent miscarriages, not currently pregnant 10/11/2011   Hypothyroidism    Infertility associated with anovulation    Obesity    BMi 33   Seizures ([place])    UTI (lower urinary tract infection) 08/14/2011   Allergies Allergies  Allergen Reactions   Codeine Itching   Codeine Itching, Rash and Other (See Comments)    OPIOIDS - MORPHINE & RELATED    SURGICAL HISTORY She  has a past surgical history that includes Gastric bypass (2012); Dilation and curettage of uterus; Gastric bypass; Wisdom tooth extraction; and Total laparoscopic hysterectomy with salpingectomy (N/A, 06/22/2021). FAMILY HISTORY Her family history includes Alcohol abuse in her maternal uncle; Alzheimer's disease in her maternal grandfather, maternal grandmother, and paternal grandmother; Anorexia nervosa in her  sister; Anxiety disorder in her maternal uncle; Depression in her maternal grandmother, maternal uncle, and mother; Diabetes in her maternal grandmother and mother. SOCIAL HISTORY She  reports that she has been smoking cigarettes. She has been smoking an average of .5 packs per day. She has never used smokeless tobacco. She reports current alcohol use. She reports that she does not use drugs.   Review of Systems: Review of Systems  Constitutional:  Negative for chills, diaphoresis, fever, malaise/fatigue and weight loss.  HENT:  Positive for congestion and sinus pain.   Eyes:  Negative for blurred vision and double vision.  Respiratory: Negative.  Negative for cough, shortness of breath and wheezing.   Cardiovascular:  Positive for leg swelling. Negative for chest pain and palpitations.  Gastrointestinal:  Negative for abdominal pain, blood in stool, constipation, diarrhea, heartburn, melena, nausea and vomiting.  Genitourinary:  Positive for dysuria and frequency.  Musculoskeletal: Negative.  Negative for back pain and joint pain.  Skin: Negative.   Neurological: Negative.  Negative for weakness.  Psychiatric/Behavioral:  Negative for depression, hallucinations, memory loss, substance abuse and suicidal ideas. The patient is not nervous/anxious and does not have insomnia.    Physical Exam: Estimated body mass index is 22.9 kg/m as calculated from the following:   Height as of 06/22/21: 5' 7.5" (1.715 m).   Weight as of this encounter: 148 lb 6.4 oz (67.3 kg). BP 98/60    Pulse 87    Temp 97.9 F (36.6 C)    Wt 148 lb 6.4 oz (67.3 kg)    SpO2 99%    BMI 22.90 kg/m  General Appearance: Well nourished, in no apparent distress.  Eyes: PERRLA, EOMs, conjunctiva no swelling or erythema Sinuses: Positive frontal tenderness ENT/Mouth: Ext aud canals clear, normal light reflex with TMs without erythema, bulging. Good dentition. No erythema, swelling, or exudate on post pharynx. Tonsils not  swollen or erythematous. Hearing normal. Crowded mouth.  Neck: Supple, thyroid normal. No bruits  Respiratory: Respiratory effort normal, BS equal bilaterally without rales, rhonchi, wheezing or stridor.  Cardio: RRR without murmurs, rubs or gallops. Prominent S2. Brisk peripheral pulses without edema.  Chest: symmetric, with normal excursions and percussion.  Abdomen: Soft, nontender, no guarding, rebound, hernias, masses, or organomegaly. .  Lymphatics: + right cervical adenopathy  Musculoskeletal: Full ROM all peripheral extremities,5/5 strength,  and normal gait.  Skin: Warm, dry without rashes, lesions, ecchymosis. 5 laparoscopic incisions noted on abdomen- clean, intact and healing well Neuro: Cranial nerves intact, reflexes equal bilaterally. Normal muscle tone, no cerebellar symptoms. Sensation intact.  Psych: Awake and oriented X 3, normal affect, Insight and Judgment appropriate.    Andreanna Mikolajczak W Avner Stroder 10:05 AM  [facility]

## 2021-07-06 ENCOUNTER — Other Ambulatory Visit: Payer: Self-pay

## 2021-07-06 ENCOUNTER — Ambulatory Visit (INDEPENDENT_AMBULATORY_CARE_PROVIDER_SITE_OTHER): Payer: Managed Care, Other (non HMO) | Admitting: Nurse Practitioner

## 2021-07-06 ENCOUNTER — Encounter: Payer: Self-pay | Admitting: Nurse Practitioner

## 2021-07-06 ENCOUNTER — Encounter: Payer: Self-pay | Admitting: Hematology

## 2021-07-06 VITALS — BP 98/60 | HR 87 | Temp 97.9°F | Wt 148.4 lb

## 2021-07-06 DIAGNOSIS — E559 Vitamin D deficiency, unspecified: Secondary | ICD-10-CM

## 2021-07-06 DIAGNOSIS — Z79899 Other long term (current) drug therapy: Secondary | ICD-10-CM

## 2021-07-06 DIAGNOSIS — J069 Acute upper respiratory infection, unspecified: Secondary | ICD-10-CM

## 2021-07-06 DIAGNOSIS — E785 Hyperlipidemia, unspecified: Secondary | ICD-10-CM

## 2021-07-06 DIAGNOSIS — G40909 Epilepsy, unspecified, not intractable, without status epilepticus: Secondary | ICD-10-CM

## 2021-07-06 DIAGNOSIS — E6609 Other obesity due to excess calories: Secondary | ICD-10-CM

## 2021-07-06 DIAGNOSIS — E039 Hypothyroidism, unspecified: Secondary | ICD-10-CM

## 2021-07-06 DIAGNOSIS — Z1152 Encounter for screening for COVID-19: Secondary | ICD-10-CM

## 2021-07-06 DIAGNOSIS — F3341 Major depressive disorder, recurrent, in partial remission: Secondary | ICD-10-CM

## 2021-07-06 DIAGNOSIS — Z6833 Body mass index (BMI) 33.0-33.9, adult: Secondary | ICD-10-CM

## 2021-07-06 DIAGNOSIS — N390 Urinary tract infection, site not specified: Secondary | ICD-10-CM

## 2021-07-06 DIAGNOSIS — E1169 Type 2 diabetes mellitus with other specified complication: Secondary | ICD-10-CM | POA: Diagnosis not present

## 2021-07-06 LAB — POC COVID19 BINAXNOW: SARS Coronavirus 2 Ag: NEGATIVE

## 2021-07-06 MED ORDER — IPRATROPIUM BROMIDE 0.03 % NA SOLN
2.0000 | Freq: Three times a day (TID) | NASAL | 2 refills | Status: DC
Start: 2021-07-06 — End: 2021-08-09

## 2021-07-06 MED ORDER — SULFAMETHOXAZOLE-TRIMETHOPRIM 400-80 MG PO TABS: 1.0000 | ORAL_TABLET | Freq: Two times a day (BID) | ORAL | 0 refills | Status: AC

## 2021-07-26 ENCOUNTER — Encounter: Payer: Self-pay | Admitting: Adult Health

## 2021-07-26 NOTE — Telephone Encounter (Signed)
Please mail lab order to patient

## 2021-08-09 ENCOUNTER — Other Ambulatory Visit: Payer: Self-pay

## 2021-08-09 ENCOUNTER — Encounter (HOSPITAL_BASED_OUTPATIENT_CLINIC_OR_DEPARTMENT_OTHER): Payer: Self-pay | Admitting: Obstetrics & Gynecology

## 2021-08-09 ENCOUNTER — Encounter: Payer: Self-pay | Admitting: Hematology

## 2021-08-09 DIAGNOSIS — J302 Other seasonal allergic rhinitis: Secondary | ICD-10-CM

## 2021-08-09 DIAGNOSIS — N760 Acute vaginitis: Secondary | ICD-10-CM

## 2021-08-09 HISTORY — DX: Acute vaginitis: N76.0

## 2021-08-09 HISTORY — DX: Other seasonal allergic rhinitis: J30.2

## 2021-08-09 NOTE — Progress Notes (Addendum)
Spoke w/ via phone for pre-op interview---PT Lab needs dos----    I stat per anesthesia, surgery orders pending           Lab results------neurology lov 05-23-2021  dr a April Manson epic last seizure was twitching of right hand spring on 2022, ekg 03-13-2021 chart/epic COVID test -----patient states asymptomatic no test needed Arrive at -------630 am 08-14-18-23 NPO after MN NO Solid Food.  Clear liquids from MN until---530 am Med rec completed Medications to take morning of surgery -----lamictal, levothyroxine, valium prn Diabetic medication -----diet controlled  Patient instructed no nail polish to be worn day of surgery Patient instructed to bring photo id and insurance card day of surgery Patient aware to have Driver (ride ) / caregiver    for 24 hours after surgery mother Johnella Moloney cell [phone] driver/caregiver Patient Special Instructions -----surgery orders req dr Alwyn Pea epic ib Pre-Op special Istructions -----no smoking x 24 hours before surgery Patient verbalized understanding of instructions that were given at this phone interview. Patient denies shortness of breath, chest pain, fever, cough at this phone interview.   Pt states she has bilateral ear daith piericings she cannot remove and she was allowed to tape the daith piercing down for 06-22-2021 surgery at [facility] or

## 2021-08-11 ENCOUNTER — Encounter: Payer: Self-pay | Admitting: Hematology

## 2021-08-11 ENCOUNTER — Other Ambulatory Visit: Payer: Self-pay | Admitting: Obstetrics & Gynecology

## 2021-08-14 ENCOUNTER — Ambulatory Visit (HOSPITAL_BASED_OUTPATIENT_CLINIC_OR_DEPARTMENT_OTHER): Payer: Managed Care, Other (non HMO) | Admitting: Anesthesiology

## 2021-08-14 ENCOUNTER — Other Ambulatory Visit: Payer: Self-pay

## 2021-08-14 ENCOUNTER — Ambulatory Visit (HOSPITAL_BASED_OUTPATIENT_CLINIC_OR_DEPARTMENT_OTHER)
Admission: RE | Admit: 2021-08-14 | Discharge: 2021-08-14 | Disposition: A | Discharge status: Home / Self Care | Payer: Managed Care, Other (non HMO) | Source: Ambulatory Visit | Attending: Obstetrics & Gynecology | Admitting: Obstetrics & Gynecology

## 2021-08-14 ENCOUNTER — Encounter (HOSPITAL_BASED_OUTPATIENT_CLINIC_OR_DEPARTMENT_OTHER): Payer: Self-pay | Admitting: Obstetrics & Gynecology

## 2021-08-14 ENCOUNTER — Encounter (HOSPITAL_BASED_OUTPATIENT_CLINIC_OR_DEPARTMENT_OTHER)
Admission: RE | Disposition: A | Discharge status: Home / Self Care | Payer: Self-pay | Source: Ambulatory Visit | Attending: Obstetrics & Gynecology

## 2021-08-14 DIAGNOSIS — Z9079 Acquired absence of other genital organ(s): Secondary | ICD-10-CM | POA: Diagnosis not present

## 2021-08-14 DIAGNOSIS — K219 Gastro-esophageal reflux disease without esophagitis: Secondary | ICD-10-CM | POA: Insufficient documentation

## 2021-08-14 DIAGNOSIS — F419 Anxiety disorder, unspecified: Secondary | ICD-10-CM | POA: Insufficient documentation

## 2021-08-14 DIAGNOSIS — T8131XA Disruption of external operation (surgical) wound, not elsewhere classified, initial encounter: Secondary | ICD-10-CM | POA: Diagnosis not present

## 2021-08-14 DIAGNOSIS — T8132XA Disruption of internal operation (surgical) wound, not elsewhere classified, initial encounter: Secondary | ICD-10-CM | POA: Insufficient documentation

## 2021-08-14 DIAGNOSIS — G40909 Epilepsy, unspecified, not intractable, without status epilepticus: Secondary | ICD-10-CM | POA: Diagnosis not present

## 2021-08-14 DIAGNOSIS — Y838 Other surgical procedures as the cause of abnormal reaction of the patient, or of later complication, without mention of misadventure at the time of the procedure: Secondary | ICD-10-CM | POA: Diagnosis not present

## 2021-08-14 DIAGNOSIS — N760 Acute vaginitis: Secondary | ICD-10-CM

## 2021-08-14 DIAGNOSIS — F32A Depression, unspecified: Secondary | ICD-10-CM | POA: Insufficient documentation

## 2021-08-14 DIAGNOSIS — F17210 Nicotine dependence, cigarettes, uncomplicated: Secondary | ICD-10-CM | POA: Diagnosis not present

## 2021-08-14 DIAGNOSIS — E039 Hypothyroidism, unspecified: Secondary | ICD-10-CM | POA: Diagnosis not present

## 2021-08-14 DIAGNOSIS — E119 Type 2 diabetes mellitus without complications: Secondary | ICD-10-CM | POA: Insufficient documentation

## 2021-08-14 DIAGNOSIS — J449 Chronic obstructive pulmonary disease, unspecified: Secondary | ICD-10-CM | POA: Diagnosis not present

## 2021-08-14 DIAGNOSIS — Z90710 Acquired absence of both cervix and uterus: Secondary | ICD-10-CM | POA: Insufficient documentation

## 2021-08-14 HISTORY — DX: Attention-deficit hyperactivity disorder, unspecified type: F90.9

## 2021-08-14 HISTORY — PX: REPAIR VAGINAL CUFF: SHX6067

## 2021-08-14 HISTORY — PX: LAPAROSCOPY: SHX197

## 2021-08-14 HISTORY — DX: Other complications of anesthesia, initial encounter: T88.59XA

## 2021-08-14 HISTORY — DX: Type 2 diabetes mellitus without complications: E11.9

## 2021-08-14 HISTORY — DX: Unspecified asthma, uncomplicated: J45.909

## 2021-08-14 HISTORY — DX: Personal history of other diseases of the nervous system and sense organs: Z86.69

## 2021-08-14 LAB — TYPE AND SCREEN
ABO/RH(D): O POS
Antibody Screen: NEGATIVE

## 2021-08-14 SURGERY — LAPAROSCOPY, DIAGNOSTIC
Anesthesia: General

## 2021-08-14 MED ORDER — POVIDONE-IODINE 10 % EX SWAB: 2.0000 "application " | Freq: Once | CUTANEOUS | Status: DC

## 2021-08-14 MED ORDER — PROPOFOL 10 MG/ML IV BOLUS
INTRAVENOUS | Status: AC
Start: 2021-08-14 — End: ?
  Filled 2021-08-14: qty 20

## 2021-08-14 MED ORDER — DEXAMETHASONE SODIUM PHOSPHATE 10 MG/ML IJ SOLN
INTRAMUSCULAR | Status: AC
  Filled 2021-08-14: qty 1

## 2021-08-14 MED ORDER — PROMETHAZINE HCL 25 MG/ML IJ SOLN: 6.2500 mg | INTRAMUSCULAR | Status: DC | PRN

## 2021-08-14 MED ORDER — ESTRADIOL 0.1 MG/GM VA CREA
TOPICAL_CREAM | VAGINAL | Status: DC | PRN
  Administered 2021-08-14: 1 via VAGINAL

## 2021-08-14 MED ORDER — ONDANSETRON HCL 4 MG/2ML IJ SOLN
INTRAMUSCULAR | Status: AC
  Filled 2021-08-14: qty 2

## 2021-08-14 MED ORDER — LIDOCAINE HCL (PF) 2 % IJ SOLN
INTRAMUSCULAR | Status: AC
  Filled 2021-08-14: qty 5

## 2021-08-14 MED ORDER — OXYCODONE HCL 5 MG PO TABS: 5.0000 mg | ORAL_TABLET | Freq: Once | ORAL | Status: DC | PRN

## 2021-08-14 MED ORDER — FENTANYL CITRATE (PF) 100 MCG/2ML IJ SOLN
INTRAMUSCULAR | Status: AC
  Filled 2021-08-14: qty 2

## 2021-08-14 MED ORDER — ACETAMINOPHEN 10 MG/ML IV SOLN: 1000.0000 mg | Freq: Once | INTRAVENOUS | Status: DC | PRN

## 2021-08-14 MED ORDER — LACTATED RINGERS IV SOLN: INTRAVENOUS | Status: DC

## 2021-08-14 MED ORDER — ROCURONIUM BROMIDE 10 MG/ML (PF) SYRINGE
PREFILLED_SYRINGE | INTRAVENOUS | Status: AC
  Filled 2021-08-14: qty 10

## 2021-08-14 MED ORDER — ACETAMINOPHEN 325 MG PO TABS: 325.0000 mg | ORAL_TABLET | ORAL | Status: DC | PRN

## 2021-08-14 MED ORDER — ACETAMINOPHEN 10 MG/ML IV SOLN
INTRAVENOUS | Status: DC | PRN
  Administered 2021-08-14: 1000 mg via INTRAVENOUS

## 2021-08-14 MED ORDER — MIDAZOLAM HCL 2 MG/2ML IJ SOLN
INTRAMUSCULAR | Status: AC
  Filled 2021-08-14: qty 2

## 2021-08-14 MED ORDER — ONDANSETRON HCL 4 MG/2ML IJ SOLN
INTRAMUSCULAR | Status: DC | PRN
  Administered 2021-08-14: 4 mg via INTRAVENOUS

## 2021-08-14 MED ORDER — BUPIVACAINE HCL (PF) 0.25 % IJ SOLN
INTRAMUSCULAR | Status: DC | PRN
Start: 2021-08-14 — End: 2021-08-14
  Administered 2021-08-14: 16 mL

## 2021-08-14 MED ORDER — TRAMADOL HCL ER 100 MG PO TB24: 100.0000 mg | ORAL_TABLET | ORAL | 0 refills | Status: DC | PRN

## 2021-08-14 MED ORDER — OXYCODONE HCL 5 MG/5ML PO SOLN: 5.0000 mg | Freq: Once | ORAL | Status: DC | PRN

## 2021-08-14 MED ORDER — FENTANYL CITRATE (PF) 100 MCG/2ML IJ SOLN
25.0000 ug | INTRAMUSCULAR | Status: DC | PRN
  Administered 2021-08-14 (×2): 50 ug via INTRAVENOUS

## 2021-08-14 MED ORDER — AMISULPRIDE (ANTIEMETIC) 5 MG/2ML IV SOLN: 10.0000 mg | Freq: Once | INTRAVENOUS | Status: DC | PRN

## 2021-08-14 MED ORDER — TRAMADOL HCL 50 MG PO TABS
ORAL_TABLET | ORAL | Status: AC
  Filled 2021-08-14: qty 2

## 2021-08-14 MED ORDER — DEXAMETHASONE SODIUM PHOSPHATE 4 MG/ML IJ SOLN
INTRAMUSCULAR | Status: DC | PRN
  Administered 2021-08-14: 10 mg via INTRAVENOUS

## 2021-08-14 MED ORDER — ACETAMINOPHEN 160 MG/5ML PO SOLN: 325.0000 mg | ORAL | Status: DC | PRN

## 2021-08-14 MED ORDER — TRAMADOL HCL 50 MG PO TABS
100.0000 mg | ORAL_TABLET | Freq: Once | ORAL | Status: AC
  Administered 2021-08-14: 100 mg via ORAL

## 2021-08-14 MED ORDER — ROCURONIUM BROMIDE 100 MG/10ML IV SOLN
INTRAVENOUS | Status: DC | PRN
  Administered 2021-08-14: 70 mg via INTRAVENOUS

## 2021-08-14 MED ORDER — LIDOCAINE-EPINEPHRINE 1 %-1:100000 IJ SOLN
INTRAMUSCULAR | Status: DC | PRN
  Administered 2021-08-14: 20 mL

## 2021-08-14 MED ORDER — CEFAZOLIN SODIUM-DEXTROSE 2-4 GM/100ML-% IV SOLN
INTRAVENOUS | Status: AC
  Filled 2021-08-14: qty 100

## 2021-08-14 MED ORDER — FENTANYL CITRATE (PF) 100 MCG/2ML IJ SOLN
INTRAMUSCULAR | Status: DC | PRN
  Administered 2021-08-14 (×2): 50 ug via INTRAVENOUS
  Administered 2021-08-14: 100 ug via INTRAVENOUS

## 2021-08-14 MED ORDER — KETOROLAC TROMETHAMINE 30 MG/ML IJ SOLN
INTRAMUSCULAR | Status: DC | PRN
Start: 2021-08-14 — End: 2021-08-14
  Administered 2021-08-14: 30 mg via INTRAVENOUS

## 2021-08-14 MED ORDER — MIDAZOLAM HCL 5 MG/5ML IJ SOLN
INTRAMUSCULAR | Status: DC | PRN
Start: 2021-08-14 — End: 2021-08-14
  Administered 2021-08-14: 2 mg via INTRAVENOUS

## 2021-08-14 MED ORDER — SUGAMMADEX SODIUM 200 MG/2ML IV SOLN
INTRAVENOUS | Status: DC | PRN
  Administered 2021-08-14: 200 mg via INTRAVENOUS

## 2021-08-14 MED ORDER — IBUPROFEN 800 MG PO TABS: 800.0000 mg | ORAL_TABLET | Freq: Three times a day (TID) | ORAL | 0 refills | Status: DC | PRN

## 2021-08-14 MED ORDER — PROPOFOL 10 MG/ML IV BOLUS
INTRAVENOUS | Status: DC | PRN
  Administered 2021-08-14: 150 mg via INTRAVENOUS
  Administered 2021-08-14: 50 mg via INTRAVENOUS

## 2021-08-14 MED ORDER — KETOROLAC TROMETHAMINE 30 MG/ML IJ SOLN
INTRAMUSCULAR | Status: AC
Start: 2021-08-14 — End: ?
  Filled 2021-08-14: qty 1

## 2021-08-14 MED ORDER — LIDOCAINE HCL (CARDIAC) PF 100 MG/5ML IV SOSY
PREFILLED_SYRINGE | INTRAVENOUS | Status: DC | PRN
  Administered 2021-08-14: 60 mg via INTRAVENOUS

## 2021-08-14 MED ORDER — CEFAZOLIN SODIUM-DEXTROSE 2-4 GM/100ML-% IV SOLN
2.0000 g | INTRAVENOUS | Status: AC
  Administered 2021-08-14: 2 g via INTRAVENOUS

## 2021-08-14 MED ORDER — ACETAMINOPHEN 10 MG/ML IV SOLN
INTRAVENOUS | Status: AC
  Filled 2021-08-14: qty 100

## 2021-08-14 SURGICAL SUPPLY — 30 items
ADH SKN CLS APL DERMABOND .7 (GAUZE/BANDAGES/DRESSINGS) ×1
COVER BACK TABLE 60X90IN (DRAPES) ×2 IMPLANT
COVER MAYO STAND STRL (DRAPES) ×2 IMPLANT
DEFOGGER SCOPE WARMER CLEARIFY (MISCELLANEOUS) ×2 IMPLANT
DERMABOND ADVANCED (GAUZE/BANDAGES/DRESSINGS) ×1
DERMABOND ADVANCED .7 DNX12 (GAUZE/BANDAGES/DRESSINGS) IMPLANT
DURAPREP 26ML APPLICATOR (WOUND CARE) ×2 IMPLANT
ELECT REM PT RETURN 9FT ADLT (ELECTROSURGICAL) ×2
ELECTRODE REM PT RTRN 9FT ADLT (ELECTROSURGICAL) ×1 IMPLANT
GAUZE 4X4 16PLY ~~LOC~~+RFID DBL (SPONGE) ×4 IMPLANT
GAUZE PACKING 1/2X5YD (GAUZE/BANDAGES/DRESSINGS) ×1 IMPLANT
GLOVE SURG ENC MOIS LTX SZ6.5 (GLOVE) ×3 IMPLANT
GLOVE SURG ENC MOIS LTX SZ7 (GLOVE) ×4 IMPLANT
GLOVE SURG UNDER POLY LF SZ7 (GLOVE) ×2 IMPLANT
KIT TURNOVER CYSTO (KITS) ×2 IMPLANT
NS IRRIG 1000ML POUR BTL (IV SOLUTION) ×2 IMPLANT
PACK LAVH (CUSTOM PROCEDURE TRAY) ×2 IMPLANT
PACK TRENDGUARD 450 HYBRID PRO (MISCELLANEOUS) IMPLANT
PROTECTOR NERVE ULNAR (MISCELLANEOUS) ×3 IMPLANT
SET TUBE SMOKE EVAC HIGH FLOW (TUBING) ×1 IMPLANT
SLEEVE XCEL OPT CAN 5 100 (ENDOMECHANICALS) ×1 IMPLANT
SPONGE T-LAP 4X18 ~~LOC~~+RFID (SPONGE) ×2 IMPLANT
SUT MON AB 4-0 PS1 27 (SUTURE) ×2 IMPLANT
SUT VIC AB 0 CT1 27 (SUTURE) ×2
SUT VIC AB 0 CT1 27XBRD ANBCTR (SUTURE) IMPLANT
SYR BULB IRRIG 60ML STRL (SYRINGE) ×2 IMPLANT
TOWEL OR 17X26 10 PK STRL BLUE (TOWEL DISPOSABLE) ×2 IMPLANT
TRAY FOLEY W/BAG SLVR 14FR LF (SET/KITS/TRAYS/PACK) ×2 IMPLANT
TRENDGUARD 450 HYBRID PRO PACK (MISCELLANEOUS) ×2
TROCAR BLADELESS OPT 5 100 (ENDOMECHANICALS) ×2 IMPLANT

## 2021-08-14 NOTE — Transfer of Care (Signed)
Immediate Anesthesia Transfer of Care Note  Patient: Lisa [name]  Procedure(s) Performed: Procedure(s) (LRB): LAPAROSCOPY DIAGNOSTIC (N/A) SECONDARY CLOSURE OF VAGINAL CUFF DEHISCENCE (N/A)  Patient Location: PACU  Anesthesia Type: General  Level of Consciousness: awake, sedated, patient cooperative and responds to stimulation  Airway & Oxygen Therapy: Patient Spontanous Breathing and Patient connected to [place] 02 and soft FM   Post-op Assessment: Report given to PACU RN, Post -op Vital signs reviewed and stable and Patient moving all extremities  Post vital signs: Reviewed and stable  Complications: No apparent anesthesia complications

## 2021-08-14 NOTE — H&P (Signed)
MD GYN HISTORY AND PHYSICAL  Admission Date: 08/14/2021  6:14 AM  Admit Diagnosis: vaginal cuff dehiscence versus abscess Patient Name: Lisa [name]        MRN#: [id number]  Subjective:    Patient is a 46 y.o. female [id number] presents for scheduled for diagnostic laparoscopy, vaginal cuff repair.  The indications for procedure are vaginal cuff dehiscence.  Patient is s/p TLH BS on 06/23/21. Patient reported spotting since surgery.    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   Depression    Diabetes mellitus without complication Type 2 diet controlled    Epilepsy ([place])    dx age 80   Habitual abortion history, antepartum    Headache(784.0)    History of diabetes mellitus type 2    prior to gastric bypass in 2012   history of migraine    none since dec 2022   History of recurrent miscarriages, not currently pregnant 10/11/2011   History of sleep apnea    no osa sonce 2013 weight loss after gastric bypass   Hypothyroidism    Infertility associated with anovulation    Obesity    BMi 33   Personal history of COVID-19 07/2020   fever, migraine, extreme lethargy x  1 week all symptoms resolved   Seasonal allergies 08/09/2021   Seizures ([place])    spring 2022 twitching of right hand only   UTI (lower urinary tract infection) 06/2021   after 05-2022 surgery    Past surgical history:  Past Surgical History:  Procedure Laterality Date   DILATION AND CURETTAGE OF UTERUS     yrs ago per  pt on 08-09-2021   GASTRIC BYPASS  06/25/2010   TOTAL LAPAROSCOPIC HYSTERECTOMY WITH SALPINGECTOMY N/A 06/22/2021   Procedure: TOTAL LAPAROSCOPIC HYSTERECTOMY WITH BILATERAL SALPINGECTOMY;  Surgeon: Sanjuana Kava, MD;  Location: [facility];  Service: Gynecology;  Laterality: N/A;   WISDOM TOOTH EXTRACTION     as teenager   Family History:  Family History  Problem Relation Age of Onset   Depression Mother    Diabetes Mother    Anorexia nervosa Sister    Alcohol abuse Maternal Uncle    Anxiety disorder Maternal Uncle    Depression Maternal Uncle    Alzheimer's disease Maternal Grandfather    Depression Maternal Grandmother    Alzheimer's disease Maternal Grandmother    Diabetes Maternal Grandmother    Alzheimer's disease Paternal Grandmother     Social History:  reports that she has been smoking cigarettes. She has a 2.50 pack-year smoking history. She has never used smokeless tobacco. She reports current alcohol use. She reports that she does not use drugs.  Allergies: Allergies  Allergen Reactions   Codeine Itching   Codeine Itching, Rash and Other (See Comments)    OPIOIDS - MORPHINE & RELATED CAN TAKE TRAMADOL CAN TAKE DEMEROL CAN TAKE     Current Medications at time of admission:  Prior to Admission medications   Medication Sig Start Date End Date Taking?  Authorizing Provider  amphetamine-dextroamphetamine (ADDERALL XR) 10 MG 24 hr capsule Take 10 mg by mouth daily at 12 noon.   Yes [provider]  amphetamine-dextroamphetamine (ADDERALL XR) 20 MG 24 hr capsule Take 20 mg by mouth in the morning. At 600 am   Yes [provider]  diazepam (VALIUM) 5 MG tablet Take 5 mg by mouth 2 (two) times daily as needed for anxiety.   Yes [provider]  Esketamine HCl, 84 MG Dose, (SPRAVATO, 84 MG DOSE,) 28 MG/DEVICE SOPK Place into the nose as needed. Prn q 14 days   Yes [provider]  Ferrous Sulfate (IRON PO) Take 1 tablet by mouth daily at 12 noon.    Yes [provider]  fluticasone (FLONASE) 50 MCG/ACT nasal spray PLACE 2 SPRAYS INTO BOTH NOSTRILS AT BEDTIME. Patient taking differently: Place 2 sprays into both nostrils at bedtime as needed. 05/11/18  Yes Unk Pinto, MD  lamoTRIgine (LAMICTAL) 100 MG tablet Take 1 tablet (100 mg total) by mouth 2 (two) times daily. 05/23/21 08/21/21 Yes Alric Ran, MD  levothyroxine (SYNTHROID) 300 MCG tablet Take  1 tablet  Daily except 1/2 tab on Sunday. Take on an empty stomach with only water for 30 minutes & no Antacid meds, Calcium or Magnesium for 4 hours & avoid Biotin 12/14/20  Yes Unk Pinto, MD  loratadine (CLARITIN) 10 MG tablet Take 10 mg by mouth daily as needed for allergies.   Yes [provider]  naproxen sodium (ALEVE) 220 MG tablet Take 440 mg by mouth daily as needed.   Yes [provider]  omeprazole (PRILOSEC) 20 MG capsule as needed. 05/01/11  Yes [provider]  traZODone (DESYREL) 50 MG tablet Take 1 tablet at Bedtime for Sleep Patient taking differently: at bedtime as needed. 04/14/19  Yes Unk Pinto, MD  Vitamin D, Ergocalciferol, (DRISDOL) 1.25 MG (50000 UNIT) CAPS capsule Take  1 capsule  weekly  for Vitamin D Deficiency Patient taking differently: Take  1 capsule  weekly  for Vitamin D Deficiency FRIDAY 10/02/20  Yes Unk Pinto, MD  Vortioxetine HBr (TRINTELLIX PO) Take by mouth at bedtime.   Yes [provider]  diclofenac Sodium (VOLTAREN) 1 % GEL Apply 1 application topically 3 (three) times daily as needed (arthritis pain (hands)).    [provider]  fexofenadine (ALLEGRA) 180 MG tablet Take 180 mg by mouth daily as needed for allergies or rhinitis.    [provider]  ibuprofen (ADVIL) 800 MG tablet Take 1 tablet (800 mg total) by mouth every 8 (eight) hours as needed for cramping or moderate pain. 06/23/21   Sanjuana Kava, MD  Menthol-Camphor (TIGER BALM ARTHRITIS RUB EX) Apply 1-2  application topically 3 (three) times daily as needed (pain.).    [provider]  SUMAtriptan (IMITREX) 100 MG tablet Take 1 tablet (100 mg total) by mouth once as needed for migraine. May repeat in 2 hours if headache persists or recurs. 11/02/20 11/03/21  Liane Comber, NP    Review of Systems: Constitutional: Negative   HENT: Negative   Eyes: Negative   Respiratory: Negative   Cardiovascular: Negative   Gastrointestinal: Negative  Genitourinary: pos for vaginal bleeding   Musculoskeletal: Negative   Skin: Negative   Neurological: Negative   Endo/Heme/Allergies: Negative   Psychiatric/Behavioral: Negative      Objective:     Physical Exam: VS: Height 5' 7.5" (1.715 m), weight 62.4 kg, last menstrual period 08/23/2020. Physical Exam General:   alert  and cooperative  Skin:   normal  Lungs:   clear to auscultation bilaterally  Heart:   regular rate and rhythm, S1, S2 normal, no murmur, click, rub or gallop  Abdomen:  soft, non-tender; bowel sounds normal; no masses,  no organomegaly  Pelvis:  Vaginal cuff: small necrotic tissue and opening at left edge of cuff            Labs / Imaging: Results for orders placed or performed during the hospital encounter of 08/14/21 (from the past 24 hour(s))  Type and screen     Status: None   Collection Time: 08/14/21  6:59 AM  Result Value Ref Range   ABO/RH(D) O POS    Antibody Screen NEG    Sample Expiration      08/17/2021,2359 Performed at [facility], [place] [address], [place], [place] [address]    No results found.    Assessment:        Patient is a 46 y.o. y.o G3P0030 with diagnosis of  possible vaginal cuff dehiscence versus abscess.         Plan:   To OR for diagnostic laparoscopy to assess if there is a pelvic abscess Will repair vaginal cuff   I had a lengthy discussion with the patient regarding her diagnosis. She was counseled about the procedure, risks, reasons, benefits and  complications to include: injury to bowel, bladder, major blood vessel, ureter, bleeding, possibility of transfusion, infection, abnormal scar formation  All the above was reviewed in detail.  All inquiries made by patient were answered.  Consent was signed, witnessed and placed into chart. Post op Instructions were reviewed, including office follow up.    Rogelio Seen Corin Formisano 08/14/2021    Sanjuana Kava MD 08/14/2021, 8:38 AM

## 2021-08-14 NOTE — Anesthesia Preprocedure Evaluation (Addendum)
Anesthesia Evaluation  Patient identified by MRN, date of birth, ID band Patient awake    Reviewed: Allergy & Precautions, NPO status , Patient's Chart, lab work & pertinent test results  Airway Mallampati: II  TM Distance: >3 FB Neck ROM: Full    Dental  (+) Chipped, Dental Advisory Given,    Pulmonary asthma , COPD, Current Smoker,    + rhonchi  + decreased breath sounds      Cardiovascular  Rhythm:Regular Rate:Normal     Neuro/Psych  Headaches, Seizures -,  PSYCHIATRIC DISORDERS Anxiety Depression    GI/Hepatic GERD  Medicated,  Endo/Other  diabetes, Type 2Hypothyroidism   Renal/GU      Musculoskeletal   Abdominal Normal abdominal exam  (+)   Peds  Hematology   Anesthesia Other Findings Clears with cough  Reproductive/Obstetrics                            Anesthesia Physical Anesthesia Plan  ASA: 2  Anesthesia Plan: General   Post-op Pain Management:    Induction: Intravenous  PONV Risk Score and Plan: 3 and Ondansetron, Dexamethasone and Midazolam  Airway Management Planned: Oral ETT  Additional Equipment: None  Intra-op Plan:   Post-operative Plan: Extubation in OR  Informed Consent: I have reviewed the patients History and Physical, chart, labs and discussed the procedure including the risks, benefits and alternatives for the proposed anesthesia with the patient or authorized representative who has indicated his/her understanding and acceptance.       Plan Discussed with: CRNA  Anesthesia Plan Comments:        Anesthesia Quick Evaluation

## 2021-08-14 NOTE — OR Nursing (Signed)
Foley cathetor d/c and vaginal packing. Scant drainage on packing. Darin Engels rn

## 2021-08-14 NOTE — Op Note (Signed)
OPERATIVE NOTE  Lisa [name]  DOB:    03-23-1976  MRN:    [id number]  CSN:    [id number]  Date of Surgery:  08/14/2021  Preop Diagnosis: ACUTE VAGINITIS   Postop Diagnosis: VAGINAL CUFF DEHISCENCE   Procedure: LAPAROSCOPY DIAGNOSTIC REPAIR VAGINAL CUFF   Anesthesia: General   Surgeon: Mady Haagensen. Alwyn Pea, M.D.  Assistant: None  Findings: Exam under anesthesia: External vulva normal vaginal cuff: vaginal suture present and apparent small area of dehiscence at left edge of cuff. Laparoscopic findings: Normal post hysterectomy pelvis. Normal vaginal cuff line no evidence of infection.  Exposed v-loc suture at cuff with hyperemia. Normal ovaries bilaterally.  Normal liver edge  Pathology: No Specimen  Fluids: 1000 mL LR  UOP:  100 mL clear urine  in foley catheter  EBL: 5 mL  Complications: None   Procedure: The patient was taken to the operating room after the risks, benefits, alternatives, complications, treatment options, and expected outcomes were discussed with the patient. The patient verbalized understanding, the patient concurred with the proposed plan and consent signed and witnessed. The patient was taken to the Operating Room, identified as Sharanya Templin and the procedure verified and a Time Out was held and the above information confirmed.  The patient was taken to the operating room after appropriate identification and placed on the operating table. She was placed in the modified lithotomy position using Allen stirrups and then placed under general anesthesia.   An examination under anesthesia was performed.  The abdomen was prepped with ChloraPrep. The perineum and vagina were prepped with multiple layers of Betadine.  The bladder was catheterized with a foley. The abdomen and perineum were draped as a sterile field.    A 5 mm midline infra-umbilical incision was made after infiltration with 0.25% Marcaine. Using direct entry 80mm Excel Optiview port attached to the  0-degree 17mm operative laparoscope was entered into the tented up abdomen under direct video visualization.  Confirmation of placement was made with the laparoscope and pneumoperitoneum was made with approximately 2L C02 gas.  The patient was placed in steep Trendelenburg and Marcaine injected in the Left abdominal quadrant where the prior laparoscopic scar was placed;  a 5 mm incision was made and 5 mm trocar advanced into the intraabdominal cavity. A complete abdominal and pelvic survey was performed with findings noted above. There was no noted injury with placement of any trochars.   All trochars were then removed from the peritoneal cavity under direct visualization as the CO2 was allowed to escape. The infra-umbilical incision was closed with 4-0 Monocryl and Dermabond and the Left lower incision with Dermabond.   Attention was turned to the vaginal portion.  An operative Graves speculum was placed. The vaginal cuff line was incised and opened using Mayo scissors.  All prior v-loc suture was removed.  The edges of the vaginal cuff were inspected for necrosis and noted to be healthy tissue.  The cuff was repaired and closed again using running locked sutures of 0- Vicryl.  The cuff was irrigated and hemostatic.  Estrace soaked vaginal packing was placed to be removed in the Recovery room prior to patient discharge.    The patient was awakened from general anesthesia and taken to the recovery room in satisfactory condition having tolerated the procedure well with sponge and instrument counts correct x 3.   It was anticipated that she would be discharged home later that afternoon.  Sanjuana Kava MD 08/14/2021 10:33 AM

## 2021-08-14 NOTE — Anesthesia Procedure Notes (Signed)
Procedure Name: Intubation Date/Time: 08/14/2021 9:02 AM Performed by: Justice Rocher, CRNA Pre-anesthesia Checklist: Patient identified, Emergency Drugs available, Suction available, Patient being monitored and Timeout performed Patient Re-evaluated:Patient Re-evaluated prior to induction Oxygen Delivery Method: Circle system utilized Preoxygenation: Pre-oxygenation with 100% oxygen Induction Type: IV induction Ventilation: Mask ventilation without difficulty Laryngoscope Size: Mac Grade View: Grade II Tube type: Oral Tube size: 7.0 mm Number of attempts: 1 Airway Equipment and Method: Stylet and Oral airway Placement Confirmation: ETT inserted through vocal cords under direct vision, positive ETCO2, breath sounds checked- equal and bilateral and CO2 detector Secured at: 22 cm Tube secured with: Tape Dental Injury: Teeth and Oropharynx as per pre-operative assessment

## 2021-08-14 NOTE — Anesthesia Postprocedure Evaluation (Signed)
Anesthesia Post Note  Patient: Lisa [name]  Procedure(s) Performed: LAPAROSCOPY DIAGNOSTIC SECONDARY CLOSURE OF VAGINAL CUFF DEHISCENCE     Patient location during evaluation: PACU Anesthesia Type: General Level of consciousness: awake and alert Pain management: pain level controlled Vital Signs Assessment: post-procedure vital signs reviewed and stable Respiratory status: spontaneous breathing, nonlabored ventilation, respiratory function stable and patient connected to nasal cannula oxygen Cardiovascular status: blood pressure returned to baseline and stable Postop Assessment: no apparent nausea or vomiting Anesthetic complications: no   No notable events documented.  Last Vitals:  Vitals:   08/14/21 1126 08/14/21 1205  BP:  110/68  Pulse: 69 68  Resp: 15 14  Temp:  (!) 36.4 C  SpO2: 96% 99%    Last Pain:  Vitals:   08/14/21 1205  PainSc: [name]

## 2021-08-14 NOTE — Discharge Instructions (Addendum)
°  Post Anesthesia Home Care Instructions  Activity: Get plenty of rest for the remainder of the day. A responsible adult should stay with you for 24 hours following the procedure.  For the next 24 hours, DO NOT: -Drive a car -Paediatric nurse -Drink alcoholic beverages -Take any medication unless instructed by your physician -Make any legal decisions or sign important papers.  Meals: Start with liquid foods such as gelatin or soup. Progress to regular foods as tolerated. Avoid greasy, spicy, heavy foods. If nausea and/or vomiting occur, drink only clear liquids until the nausea and/or vomiting subsides. Call your physician if vomiting continues.  Special Instructions/Symptoms: Your throat may feel dry or sore from the anesthesia or the breathing tube placed in your throat during surgery. If this causes discomfort, gargle with warm salt water. The discomfort should disappear within 24 hours.     DISCHARGE INSTRUCTIONS: Laparoscopy  The following instructions have been prepared to help you care for yourself upon your return home today.  Wound care:  Do not get the incision wet for the first 24 hours. The incision should be kept clean and dry.  The Band-Aids or dressings may be removed the day after surgery.  Should the incision become sore, red, and swollen after the first week, check with your doctor.  Personal hygiene:  Shower the day after your procedure.  Activity and limitations:  Do NOT drive or operate any equipment today.  Do NOT lift anything more than 15 pounds for 2-3 weeks after surgery.  Do NOT rest in bed all day.  Walking is encouraged. Walk each day, starting slowly with 5-minute walks 3 or 4 times a day. Slowly increase the length of your walks.  Walk up and down stairs slowly.  Do NOT do strenuous activities, such as golfing, playing tennis, bowling, running, biking, weight lifting, gardening, mowing, or vacuuming for 2-4 weeks. Ask your doctor when it is okay to  start.  Diet: Eat a light meal as desired this evening. You may resume your usual diet tomorrow.  Return to work: This is dependent on the type of work you do. For the most part you can return to a desk job within a week of surgery. If you are more active at work, please discuss this with your doctor.  What to expect after your surgery: You may have a slight burning sensation when you urinate on the first day. You may have a very small amount of blood in the urine. Expect to have a small amount of vaginal discharge/light bleeding for 1-2 weeks. It is not unusual to have abdominal soreness and bruising for up to 2 weeks. You may be tired and need more rest for about 1 week. You may experience shoulder pain for 24-72 hours. Lying flat in bed may relieve it.  Call your doctor for any of the following:  Develop a fever of 100.4 or greater  Inability to urinate 6 hours after discharge from hospital  Severe pain not relieved by pain medications  Persistent of heavy bleeding at incision site  Redness or swelling around incision site after a week  Increasing nausea or vomiting  Patient Signature________________________________________ Nurse Signature_________________________________________

## 2021-08-15 ENCOUNTER — Encounter (HOSPITAL_BASED_OUTPATIENT_CLINIC_OR_DEPARTMENT_OTHER): Payer: Self-pay | Admitting: Obstetrics & Gynecology

## 2021-10-11 ENCOUNTER — Encounter: Payer: Self-pay | Admitting: Nurse Practitioner

## 2021-10-11 ENCOUNTER — Other Ambulatory Visit: Payer: Self-pay | Admitting: Nurse Practitioner

## 2021-10-20 ENCOUNTER — Other Ambulatory Visit: Payer: Self-pay | Admitting: Adult Health

## 2021-10-20 DIAGNOSIS — E039 Hypothyroidism, unspecified: Secondary | ICD-10-CM

## 2021-10-24 NOTE — Progress Notes (Deleted)
3 FOLLOW UP  Assessment and Plan:  Type 2 diabetes mellitus with hyperlipidemia ([place]) Currently controlled without medication Eye Exam yearly and Dental Exam every 6 months. Dietary recommendations Physical Activity recommendations - A1C - lipid panel - CMP/GFR  Hypothyroidism, unspecified type -check TSH level, continue medications the same, reminded to take on an empty stomach 30-49mns before food.  - TSH  Seizure disorder (HCC) Continue medications, neuro following  Recurrent major depressive disorder, in partial remission ([place] Continue to see psych  Hx of morbid obesity/ s/p gastric sleeve - BMI 23 Continue diet/exercise, uses phentermine PRN during menstrual cycles only (knows not to take on days taking adderal).   Vitamin D deficiency Continue supplement  Med management - CBC, CMP/GFR, magnesium    [facility], HAS RX FOR LABS AND WILL GET DONE Future Appointments  Date Time Provider [place] 10/25/2021 10:00 AM MMagda Bernheim NP [facility] None  03/13/2022 10:00 AM MMagda Bernheim NP [facility] None  05/23/2022 10:15 AM CAlric Ran MD [facility] None    HPI  46y.o. female  presents for  follow up for chol, DM, seizures, anemia.  Works for [employer] gets labs there.   Has lifestyle controlled reflux, rare breakthrough.  She is having a lot of sinus congestion, has yellow /green nasal drainage.  Denies cough, myalgias, nausea, vomiting and diarrhea  She is currently being treated with Macrobid for a UTI.  Having frequency, dysuria.  Denies blood in urine and fevers.  She had TAH- LSO laparoscopic 10 days ago.  Pain is 3/10 currently.  Incisions are healing well   BMI is There is no height or weight on file to calculate BMI., hx of morbid obesity, peak 260 lb, s/p gastric bypass in 2012, she has been working on diet and exercise, working out (lifting/body weight) and walking dogs 3-4 days Has new puppy, planning to start walking. Continues to  use Phentermine as needed.  Wt Readings from Last 3 Encounters:  08/14/21 137 lb 9.6 oz (62.4 kg)  07/06/21 148 lb 6.4 oz (67.3 kg)  06/22/21 145 lb (65.8 kg)   She had BP cuff but hasn't been checking, today their BP is   She does workout, walking with dogs.  She denies chest pain, shortness of breath, dizziness.     She is not on cholesterol medication and denies myalgias. Her cholesterol is at goal. The cholesterol last visit was:   Lab Results  Component Value Date   CHOL 202 (H) 03/23/2021   HDL 110 03/23/2021   LDLCALC 80 03/23/2021   TRIG 66 03/23/2021   CHOLHDL 1.8 03/23/2021    She has been working on diet and exercise for diabetes  with hyperlipidemia not on medications due to well controlled A1C Without CKD No CAD, CHF, or PVD denies paresthesia of the feet, polydipsia, polyuria and visual disturbances.  Last A1C in the office was:  Lab Results  Component Value Date   HGBA1C 4.7 (L) 03/23/2021   Lab Results  Component Value Date   GFRNONAA >60 06/16/2021   Patient is on Vitamin D supplement, 50000 IU once a week.  Lab Results  Component Value Date   VD25OH 48.6 01/19/2020     She is on thyroid medication. Her medication was not changed last visit. Taking levothyroxine 300 mcg daily, takes on empty stomach.  Lab Results  Component Value Date   TSH 0.065 (L) 03/23/2021   Due to her gastric bypass she has has severe  symptomatic iron def anemia, has PRN iron infusions.   Lab Results  Component Value Date   IRON 96 03/23/2021   TIBC 266 03/23/2021   FERRITIN 98 03/23/2021   She has had epilepsy since she was 44, currently on Lamictal 100 mg BID. Should remain on medication. Follows with Dr. Tommy Medal. He checks levels.    Current Outpatient Medications on File Prior to Visit  Medication Sig   amphetamine-dextroamphetamine (ADDERALL XR) 10 MG 24 hr capsule Take 10 mg by mouth daily at 12 noon.   amphetamine-dextroamphetamine (ADDERALL XR) 20 MG 24 hr  capsule Take 20 mg by mouth in the morning. At 600 am   diazepam (VALIUM) 5 MG tablet Take 5 mg by mouth 2 (two) times daily as needed for anxiety.   diclofenac Sodium (VOLTAREN) 1 % GEL Apply 1 application topically 3 (three) times daily as needed (arthritis pain (hands)).   Esketamine HCl, 84 MG Dose, (SPRAVATO, 84 MG DOSE,) 28 MG/DEVICE SOPK Place into the nose as needed. Prn q 14 days   Ferrous Sulfate (IRON PO) Take 1 tablet by mouth daily at 12 noon.   fexofenadine (ALLEGRA) 180 MG tablet Take 180 mg by mouth daily as needed for allergies or rhinitis.   fluticasone (FLONASE) 50 MCG/ACT nasal spray PLACE 2 SPRAYS INTO BOTH NOSTRILS AT BEDTIME. (Patient taking differently: Place 2 sprays into both nostrils at bedtime as needed.)   ibuprofen (ADVIL) 800 MG tablet Take 1 tablet (800 mg total) by mouth every 8 (eight) hours as needed for cramping or moderate pain.   ibuprofen (ADVIL) 800 MG tablet Take 1 tablet (800 mg total) by mouth every 8 (eight) hours as needed.   lamoTRIgine (LAMICTAL) 100 MG tablet Take 1 tablet (100 mg total) by mouth 2 (two) times daily.   levothyroxine (SYNTHROID) 300 MCG tablet TAKE 1 TABLET BY MOUTH DAILY EXCEPT 1/2 TABLET ON SUNDAY - TAKE ON AN EMPTY STOMACH WITH ONLY WATER FOR 30 MINUTES AND NO ANTACID MEDS, CALCIUM OR MAGNESIUM FOR 4 HOURS AND AVOID BIOTIN   loratadine (CLARITIN) 10 MG tablet Take 10 mg by mouth daily as needed for allergies.   Menthol-Camphor (TIGER BALM ARTHRITIS RUB EX) Apply 1-2 application topically 3 (three) times daily as needed (pain.).   naproxen sodium (ALEVE) 220 MG tablet Take 440 mg by mouth daily as needed.   omeprazole (PRILOSEC) 20 MG capsule as needed.   SUMAtriptan (IMITREX) 100 MG tablet Take 1 tablet (100 mg total) by mouth once as needed for migraine. May repeat in 2 hours if headache persists or recurs.   traMADol (ULTRAM-ER) 100 MG 24 hr tablet Take 1 tablet (100 mg total) by mouth every 4 (four) hours as needed for pain.    traZODone (DESYREL) 50 MG tablet Take 1 tablet at Bedtime for Sleep (Patient taking differently: at bedtime as needed.)   Vitamin D, Ergocalciferol, (DRISDOL) 1.25 MG (50000 UNIT) CAPS capsule Take  1 capsule  weekly  for Vitamin D Deficiency (Patient taking differently: Take  1 capsule  weekly  for Vitamin D Deficiency FRIDAY)   Vortioxetine HBr (TRINTELLIX PO) Take by mouth at bedtime.   No current facility-administered medications on file prior to visit.   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   Depression    Diabetes mellitus without complication Type 2 diet controlled    Epilepsy ([place])    dx age 33   Habitual abortion history, antepartum    Headache(784.0)    History of diabetes mellitus type 2    prior to gastric bypass in 2012   history of migraine    none since dec 2022   History of recurrent miscarriages, not currently pregnant 10/11/2011   History of sleep apnea    no osa sonce 2013 weight loss after gastric bypass   Hypothyroidism    Infertility associated with anovulation    Obesity    BMi 33   Personal history of COVID-19 07/2020   fever, migraine, extreme lethargy x  1 week all symptoms resolved   Seasonal allergies 08/09/2021   Seizures ([place])    spring 2022 twitching of right hand only   UTI (lower urinary tract infection) 06/2021   after 05-2022 surgery   Allergies Allergies  Allergen Reactions   Codeine Itching   Codeine Itching, Rash and Other (See Comments)    OPIOIDS - MORPHINE & RELATED CAN TAKE TRAMADOL CAN TAKE DEMEROL CAN TAKE    SURGICAL HISTORY She  has a past surgical history that includes Gastric bypass (06/25/2010);  Dilation and curettage of uterus; Wisdom tooth extraction; Total laparoscopic hysterectomy with salpingectomy (N/A, 06/22/2021); laparoscopy (N/A, 08/14/2021); and Repair vaginal cuff (N/A, 08/14/2021). FAMILY HISTORY Her family history includes Alcohol abuse in her maternal uncle; Alzheimer's disease in her maternal grandfather, maternal grandmother, and paternal grandmother; Anorexia nervosa in her sister; Anxiety disorder in her maternal uncle; Depression in her maternal grandmother, maternal uncle, and mother; Diabetes in her maternal grandmother and mother. SOCIAL HISTORY She  reports that she has been smoking cigarettes. She has a 2.50 pack-year smoking history. She has never used smokeless tobacco. She reports current alcohol use. She reports that she does not use drugs.   Review of Systems: Review of Systems  Constitutional:  Negative for chills, diaphoresis, fever, malaise/fatigue and weight loss.  HENT:  Positive for congestion and sinus pain.   Eyes:  Negative for blurred vision and double vision.  Respiratory: Negative.  Negative for cough, shortness of breath and wheezing.   Cardiovascular:  Positive for leg swelling. Negative for chest pain and palpitations.  Gastrointestinal:  Negative for abdominal pain, blood in stool, constipation, diarrhea, heartburn, melena, nausea and vomiting.  Genitourinary:  Positive for dysuria and frequency.  Musculoskeletal: Negative.  Negative for back pain and joint pain.  Skin: Negative.   Neurological: Negative.  Negative for weakness.  Psychiatric/Behavioral:  Negative for depression, hallucinations, memory loss, substance abuse and suicidal ideas. The patient is not nervous/anxious and does not have insomnia.    Physical Exam: Estimated body mass index is 21.23 kg/m as calculated from the following:   Height as of 08/14/21: 5' 7.5" (1.715 m).   Weight as of 08/14/21: 137 lb 9.6 oz (62.4 kg). LMP 08/23/2020  General Appearance: Well nourished,  in no apparent distress.  Eyes: PERRLA, EOMs, conjunctiva no swelling or erythema Sinuses: Positive frontal tenderness ENT/Mouth: Ext aud canals clear, normal light reflex with TMs without erythema, bulging. Good dentition. No erythema, swelling, or exudate on post pharynx. Tonsils not swollen or erythematous. Hearing normal. Crowded mouth.  Neck: Supple, thyroid normal. No bruits  Respiratory: Respiratory effort normal, BS equal bilaterally without rales, rhonchi, wheezing or stridor.  Cardio: RRR without murmurs, rubs or gallops. Prominent S2. Brisk peripheral pulses without edema.  Chest: symmetric, with normal excursions and percussion.  Abdomen: Soft, nontender, no guarding, rebound, hernias, masses, or organomegaly. .  Lymphatics: + right cervical adenopathy  Musculoskeletal: Full ROM all peripheral extremities,5/5 strength, and normal gait.  Skin: Warm, dry without rashes, lesions, ecchymosis. 5 laparoscopic incisions noted on abdomen- clean, intact and healing well Neuro: Cranial nerves intact, reflexes equal bilaterally. Normal muscle tone, no cerebellar symptoms. Sensation intact.  Psych: Awake and oriented X 3, normal affect, Insight and Judgment appropriate.    Genella Bas W Khambrel Amsden 9:03 AM  [facility]

## 2021-10-25 ENCOUNTER — Ambulatory Visit: Payer: Managed Care, Other (non HMO) | Admitting: Nurse Practitioner

## 2021-10-25 ENCOUNTER — Encounter: Payer: Self-pay | Admitting: Hematology

## 2021-10-25 ENCOUNTER — Encounter: Payer: Self-pay | Admitting: Nurse Practitioner

## 2021-10-25 VITALS — BP 118/72 | HR 94 | Temp 97.7°F | Wt 136.2 lb

## 2021-10-25 DIAGNOSIS — E6609 Other obesity due to excess calories: Secondary | ICD-10-CM | POA: Diagnosis not present

## 2021-10-25 DIAGNOSIS — E1169 Type 2 diabetes mellitus with other specified complication: Secondary | ICD-10-CM

## 2021-10-25 DIAGNOSIS — F3341 Major depressive disorder, recurrent, in partial remission: Secondary | ICD-10-CM

## 2021-10-25 DIAGNOSIS — Z6833 Body mass index (BMI) 33.0-33.9, adult: Secondary | ICD-10-CM

## 2021-10-25 DIAGNOSIS — Z79899 Other long term (current) drug therapy: Secondary | ICD-10-CM

## 2021-10-25 DIAGNOSIS — E039 Hypothyroidism, unspecified: Secondary | ICD-10-CM

## 2021-10-25 DIAGNOSIS — E559 Vitamin D deficiency, unspecified: Secondary | ICD-10-CM

## 2021-10-25 DIAGNOSIS — G40909 Epilepsy, unspecified, not intractable, without status epilepticus: Secondary | ICD-10-CM

## 2021-10-25 DIAGNOSIS — R5383 Other fatigue: Secondary | ICD-10-CM | POA: Diagnosis not present

## 2021-10-25 DIAGNOSIS — Z9884 Bariatric surgery status: Secondary | ICD-10-CM

## 2021-10-25 DIAGNOSIS — E785 Hyperlipidemia, unspecified: Secondary | ICD-10-CM | POA: Diagnosis not present

## 2021-10-25 DIAGNOSIS — J439 Emphysema, unspecified: Secondary | ICD-10-CM

## 2021-10-25 NOTE — Progress Notes (Signed)
3 FOLLOW UP ? ?Assessment and Plan: ? ?Type 2 diabetes mellitus with hyperlipidemia ([place]) ?Currently controlled without medication ?Eye Exam yearly and Dental Exam every 6 months. ?Dietary recommendations ?Physical Activity recommendations ?Currently off Ozmepic ?- A1C ?- lipid panel ?- CMP/GFR ? ?Hypothyroidism, unspecified type ?-currently off Thyroid medication  ?- Thyroid panel ?Forward results to Dr Gray Bernhardt- OB/GYN ? ?Fatigue/ Weight loss ?Will check Iron, TIBC, Ferritin, Vit B12 ? ?Seizure disorder ([place]) ?Continue medications, neuro following ? ?Recurrent major depressive disorder, in partial remission ([place]) ?Continue to see psych ? ?Hx of morbid obesity/ s/p gastric sleeve - BMI 23 ?Continue diet/exercise, uses phentermine PRN during menstrual cycles only (knows not to take on days taking adderal).  ? ?Vitamin D deficiency ?Continue supplement ? ?Med management ?- CBC, CMP/GFR, magnesium  ? ? ?[facility], HAS RX FOR LABS AND WILL GET DONE ?Future Appointments  ?Date Time Provider [place]  ?10/25/2021 11:00 AM Magda Bernheim, NP [facility] None  ?03/13/2022 10:00 AM Magda Bernheim, NP [facility] None  ?05/23/2022 10:15 AM Alric Ran, MD [facility] None  ? ? ?HPI  ?46 y.o. female  presents for  follow up for chol, DM, seizures, anemia.  Works for [employer], gets labs there.  ? ?She is having a lot of issues with her allergies. She is currently using Claritin, Flonase with some relief. ? ?She had TAH- LSO laparoscopic 05/2021 , had blood work done 07/2021 which showed she was hyperthyroid so she has been off her medication.  ? ?She is having more fatigue and feeling cold all the time.  Is wondering if her iron is low.  Bruising very easily. Has also noticed Hair falling out a lot more ? ?BMI is Body mass index is 21.02 kg/m?., hx of morbid obesity, peak 260 lb, s/p gastric bypass in 2012, she has been working on diet and exercise, working out (lifting/body weight) and walking dogs 3-4 days Has new  puppy, planning to start walking. Eats every 2 hours, high protein. ?Wt Readings from Last 3 Encounters:  ?10/25/21 136 lb 3.2 oz (61.8 kg)  ?08/14/21 137 lb 9.6 oz (62.4 kg)  ?07/06/21 148 lb 6.4 oz (67.3 kg)  ? ?She had BP cuff but hasn't been checking, today their BP is BP: 118/72 ?She does workout, walking with dogs.  She denies chest pain, shortness of breath, dizziness.   ? ? She is not on cholesterol medication and denies myalgias. Her cholesterol is at goal. The cholesterol last visit was:   ?Lab Results  ?Component Value Date  ? CHOL 202 (H) 03/23/2021  ? HDL 110 03/23/2021  ? [place] 80 03/23/2021  ? TRIG 66 03/23/2021  ? CHOLHDL 1.8 03/23/2021  ? ? She has been working on diet and exercise for diabetes ? with hyperlipidemia not on medications due to well controlled A1C ?She has been off her Ozempic since March. ?Without CKD ?No CAD, CHF, or PVD ?denies paresthesia of the feet, polydipsia, polyuria and visual disturbances.  ?Last A1C in the office was:  ?Lab Results  ?Component Value Date  ? HGBA1C 4.7 (L) 03/23/2021  ? ?Lab Results  ?Component Value Date  ? GFRNONAA >60 06/16/2021  ? ?Patient is on Vitamin D supplement, 50000 IU once a week.  ?Lab Results  ?Component Value Date  ? VD25OH 48.6 01/19/2020  ?   ?She is on thyroid medication. Her medication was not changed last visit. Taking levothyroxine 300 mcg daily, takes on empty stomach.  ?Lab Results  ?  Component Value Date  ? TSH 0.065 (L) 03/23/2021  ? ?Due to her gastric bypass she has has severe symptomatic iron def anemia, has PRN iron infusions.   ?Lab Results  ?Component Value Date  ? IRON 96 03/23/2021  ? TIBC 266 03/23/2021  ? FERRITIN 98 03/23/2021  ? ?She has had epilepsy since she was 62, currently on Lamictal 100 mg BID. Should remain on medication. Follows with Dr. Tommy Medal. He checks levels.  ? ? ?Current Outpatient Medications on File Prior to Visit  ?Medication Sig  ? amphetamine-dextroamphetamine (ADDERALL XR) 10 MG 24 hr capsule  Take 10 mg by mouth daily at 12 noon.  ? amphetamine-dextroamphetamine (ADDERALL XR) 20 MG 24 hr capsule Take 20 mg by mouth in the morning. At 600 am  ? diazepam (VALIUM) 5 MG tablet Take 5 mg by mouth 2 (two) times daily as needed for anxiety.  ? diclofenac Sodium (VOLTAREN) 1 % GEL Apply 1 application topically 3 (three) times daily as needed (arthritis pain (hands)).  ? Esketamine HCl, 84 MG Dose, (SPRAVATO, 84 MG DOSE,) 28 MG/DEVICE SOPK Place into the nose as needed. Prn q 14 days  ? Ferrous Sulfate (IRON PO) Take 1 tablet by mouth daily at 12 noon.  ? fexofenadine (ALLEGRA) 180 MG tablet Take 180 mg by mouth daily as needed for allergies or rhinitis.  ? fluticasone (FLONASE) 50 MCG/ACT nasal spray PLACE 2 SPRAYS INTO BOTH NOSTRILS AT BEDTIME. (Patient taking differently: Place 2 sprays into both nostrils at bedtime as needed.)  ? ibuprofen (ADVIL) 800 MG tablet Take 1 tablet (800 mg total) by mouth every 8 (eight) hours as needed for cramping or moderate pain.  ? levothyroxine (SYNTHROID) 300 MCG tablet TAKE 1 TABLET BY MOUTH DAILY EXCEPT 1/2 TABLET ON SUNDAY - TAKE ON AN EMPTY STOMACH WITH ONLY WATER FOR 30 MINUTES AND NO ANTACID MEDS, CALCIUM OR MAGNESIUM FOR 4 HOURS AND AVOID BIOTIN  ? loratadine (CLARITIN) 10 MG tablet Take 10 mg by mouth daily as needed for allergies.  ? Menthol-Camphor (TIGER BALM ARTHRITIS RUB EX) Apply 1-2 application topically 3 (three) times daily as needed (pain.).  ? naproxen sodium (ALEVE) 220 MG tablet Take 440 mg by mouth daily as needed.  ? omeprazole (PRILOSEC) 20 MG capsule as needed.  ? SUMAtriptan (IMITREX) 100 MG tablet Take 1 tablet (100 mg total) by mouth once as needed for migraine. May repeat in 2 hours if headache persists or recurs.  ? traMADol (ULTRAM-ER) 100 MG 24 hr tablet Take 1 tablet (100 mg total) by mouth every 4 (four) hours as needed for pain.  ? traZODone (DESYREL) 50 MG tablet Take 1 tablet at Bedtime for Sleep (Patient taking differently: at bedtime as  needed.)  ? Vitamin D, Ergocalciferol, (DRISDOL) 1.25 MG (50000 UNIT) CAPS capsule Take  1 capsule  weekly  for Vitamin D Deficiency (Patient taking differently: Take  1 capsule  weekly  for Vitamin D Deficiency FRIDAY)  ? Vortioxetine HBr (TRINTELLIX PO) Take by mouth at bedtime.  ? ibuprofen (ADVIL) 800 MG tablet Take 1 tablet (800 mg total) by mouth every 8 (eight) hours as needed.  ? lamoTRIgine (LAMICTAL) 100 MG tablet Take 1 tablet (100 mg total) by mouth 2 (two) times daily.  ? ?No current facility-administered medications on file prior to visit.  ? ?Medical History:  ?Past Medical History:  ?Diagnosis Date  ? Abdominal cramping and spotting since 12 -2022 surgery 08/10/2011  ? Acute vaginitis 08/09/2021  ? ADHD (attention  deficit hyperactivity disorder)   ? Anemia   ? Anxiety   ? Childhood Asthma   ? resolved  ? Complication of anesthesia   ? 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 Depression   ? Diabetes mellitus without complication Type 2 diet controlled   ? Epilepsy ([place])   ? dx age 62  ? Habitual abortion history, antepartum   ? Headache(784.0)   ? History of diabetes mellitus type 2   ? prior to gastric bypass in 2012  ? history of migraine   ? none since dec 2022  ? History of recurrent miscarriages, not currently pregnant 10/11/2011  ? History of sleep apnea   ? no osa sonce 2013 weight loss after gastric bypass  ? Hypothyroidism   ? Infertility associated with anovulation   ? Obesity   ? BMi 33  ? Personal history of COVID-19 07/2020  ? fever, migraine, extreme lethargy x  1 week all symptoms resolved  ? Seasonal allergies 08/09/2021  ? Seizures ([place])   ? spring 2022 twitching of right hand only  ? UTI (lower urinary tract infection) 06/2021  ? after 05-2022 surgery  ? ?Allergies ?Allergies  ?Allergen Reactions  ? Codeine Itching  ? Codeine Itching, Rash and Other (See Comments)  ?  OPIOIDS - MORPHINE & RELATED CAN TAKE ?TRAMADOL CAN TAKE ?DEMEROL CAN TAKE   ? ? ?SURGICAL HISTORY ?She  has a past surgical history that includes Gastric bypass (06/25/2010); Dilation and curettage of uterus; Wisdom tooth extraction; Total laparoscopic hysterectomy with salpingectomy

## 2021-10-26 LAB — SPECIMEN STATUS REPORT

## 2021-10-27 ENCOUNTER — Ambulatory Visit: Payer: Managed Care, Other (non HMO) | Admitting: Nurse Practitioner

## 2021-10-27 LAB — COMPREHENSIVE METABOLIC PANEL
ALT: 26 IU/L (ref 0–32)
AST: 26 IU/L (ref 0–40)
Albumin/Globulin Ratio: 1.8 (ref 1.2–2.2)
Albumin: 4.5 g/dL (ref 3.8–4.8)
Alkaline Phosphatase: 94 IU/L (ref 44–121)
BUN/Creatinine Ratio: 15 (ref 9–23)
BUN: 11 mg/dL (ref 6–24)
Bilirubin Total: 0.4 mg/dL (ref 0.0–1.2)
CO2: 23 mmol/L (ref 20–29)
Calcium: 9.7 mg/dL (ref 8.7–10.2)
Chloride: 102 mmol/L (ref 96–106)
Creatinine, Ser: 0.72 mg/dL (ref 0.57–1.00)
Globulin, Total: 2.5 g/dL (ref 1.5–4.5)
Glucose: 104 mg/dL — ABNORMAL HIGH (ref 70–99)
Potassium: 4.1 mmol/L (ref 3.5–5.2)
Sodium: 137 mmol/L (ref 134–144)
Total Protein: 7 g/dL (ref 6.0–8.5)
eGFR: 105 mL/min/{1.73_m2} (ref >59)

## 2021-10-27 LAB — CBC WITH DIFFERENTIAL/PLATELET
Basophils Absolute: 0.1 10*3/uL (ref 0.0–0.2)
Basos: 1 %
EOS (ABSOLUTE): 0.2 10*3/uL (ref 0.0–0.4)
Eos: 3 %
Hematocrit: 40.3 % (ref 34.0–46.6)
Hemoglobin: 14.3 g/dL (ref 11.1–15.9)
Immature Grans (Abs): 0 10*3/uL (ref 0.0–0.1)
Immature Granulocytes: 0 %
Lymphocytes Absolute: 3.5 10*3/uL — ABNORMAL HIGH (ref 0.7–3.1)
Lymphs: 44 %
MCH: 33.1 pg — ABNORMAL HIGH (ref 26.6–33.0)
MCHC: 35.5 g/dL (ref 31.5–35.7)
MCV: 93 fL (ref 79–97)
Monocytes Absolute: 0.7 10*3/uL (ref 0.1–0.9)
Monocytes: 8 %
Neutrophils Absolute: 3.5 10*3/uL (ref 1.4–7.0)
Neutrophils: 44 %
Platelets: 378 10*3/uL (ref 150–450)
RBC: 4.32 x10E6/uL (ref 3.77–5.28)
RDW: 14.1 % (ref 11.7–15.4)
WBC: 7.9 10*3/uL (ref 3.4–10.8)

## 2021-10-27 LAB — THYROID PANEL WITH TSH
Free Thyroxine Index: 0.8 — ABNORMAL LOW (ref 1.2–4.9)
T3 Uptake Ratio: 24 % (ref 24–39)
T4, Total: 3.5 ug/dL — ABNORMAL LOW (ref 4.5–12.0)
TSH: 12.5 u[IU]/mL — ABNORMAL HIGH (ref 0.450–4.500)

## 2021-10-27 LAB — LIPID PANEL W/O CHOL/HDL RATIO
Cholesterol, Total: 242 mg/dL — ABNORMAL HIGH (ref 100–199)
HDL: 123 mg/dL (ref >39)
LDL Chol Calc (NIH): 109 mg/dL — ABNORMAL HIGH (ref 0–99)
Triglycerides: 59 mg/dL (ref 0–149)
VLDL Cholesterol Cal: 10 mg/dL (ref 5–40)

## 2021-10-27 LAB — IRON,TIBC AND FERRITIN PANEL
Ferritin: 42 ng/mL (ref 15–150)
Iron Saturation: 9 % — CL (ref 15–55)
Iron: 31 ug/dL (ref 27–159)
Total Iron Binding Capacity: 341 ug/dL (ref 250–450)
UIBC: 310 ug/dL (ref 131–425)

## 2021-10-27 LAB — HGB A1C W/O EAG: Hgb A1c MFr Bld: 5.1 % (ref 4.8–5.6)

## 2021-10-30 ENCOUNTER — Encounter: Payer: Self-pay | Admitting: Nurse Practitioner

## 2021-10-30 ENCOUNTER — Other Ambulatory Visit: Payer: Self-pay | Admitting: Nurse Practitioner

## 2021-10-30 DIAGNOSIS — E669 Obesity, unspecified: Secondary | ICD-10-CM

## 2021-10-30 DIAGNOSIS — R790 Abnormal level of blood mineral: Secondary | ICD-10-CM

## 2021-10-30 DIAGNOSIS — E039 Hypothyroidism, unspecified: Secondary | ICD-10-CM

## 2021-10-30 MED ORDER — LEVOTHYROXINE SODIUM 125 MCG PO TABS: 125.0000 ug | ORAL_TABLET | Freq: Every day | ORAL | 3 refills | Status: DC

## 2021-10-30 NOTE — Telephone Encounter (Signed)
Please mail her a copy of her labs

## 2021-11-01 ENCOUNTER — Telehealth: Payer: Self-pay | Admitting: Hematology

## 2021-11-01 NOTE — Telephone Encounter (Signed)
Scheduled appt per 5/8 referral. Pt is aware of appt date and time. Pt is aware to arrive 15 mins prior to appt time and to bring and updated insurance card. Pt is aware of appt location.   ?

## 2021-11-16 ENCOUNTER — Inpatient Hospital Stay: Payer: Managed Care, Other (non HMO) | Attending: Hematology | Admitting: Hematology

## 2021-11-16 ENCOUNTER — Other Ambulatory Visit: Payer: Self-pay

## 2021-11-16 VITALS — BP 132/90 | HR 93 | Temp 97.9°F | Resp 20 | Wt 138.0 lb

## 2021-11-16 DIAGNOSIS — R5383 Other fatigue: Secondary | ICD-10-CM | POA: Diagnosis not present

## 2021-11-16 DIAGNOSIS — Z9884 Bariatric surgery status: Secondary | ICD-10-CM | POA: Diagnosis not present

## 2021-11-16 DIAGNOSIS — F17210 Nicotine dependence, cigarettes, uncomplicated: Secondary | ICD-10-CM | POA: Diagnosis not present

## 2021-11-16 DIAGNOSIS — D509 Iron deficiency anemia, unspecified: Secondary | ICD-10-CM | POA: Diagnosis not present

## 2021-11-16 DIAGNOSIS — E119 Type 2 diabetes mellitus without complications: Secondary | ICD-10-CM | POA: Diagnosis not present

## 2021-11-16 DIAGNOSIS — E669 Obesity, unspecified: Secondary | ICD-10-CM | POA: Diagnosis not present

## 2021-11-22 ENCOUNTER — Other Ambulatory Visit: Payer: Self-pay | Admitting: Hematology

## 2021-11-23 ENCOUNTER — Encounter: Payer: Self-pay | Admitting: Hematology

## 2021-11-23 NOTE — Progress Notes (Signed)
HEMATOLOGY/ONCOLOGY CONSULTATION NOTE  Date of Service: 11/16/2021   Patient Care Team: Magda Bernheim, NP as PCP - General (Nurse Practitioner) Unk Pinto, MD as Consulting Physician (Internal Medicine)  CHIEF COMPLAINTS/PURPOSE OF CONSULTATION:  Follow-up for management of iron deficiency anemia  HISTORY OF PRESENTING ILLNESS:   Lisa [name] is a wonderful 46 y.o. female who has been referred to [place] by Dr Demetra Shiner, [place]send Roger, NP for evaluation and management of iron deficiency.   Patient has a history of obesity, diabetes and has had a gastric bypass surgery about 10 years ago.  She was previously referred to [place] in 2018 for management of iron deficiency anemia and received IV iron replacement at the time.  She recently had labs with her primary care physician which in December 2022 showed mild anemia with a hemoglobin of 11.9 with a WBC count of 12.2k and platelets of 282k. Recent labs on 10/25/2021 showed normal hemoglobin of 14.3 with a WBC count of 7.9k and platelets of 378k. Iron labs showed ferritin of 42 with an iron saturation of 9%.  She was referred back to [place] for consideration of IV iron therapy due to previous lack of response to p.o. iron. She notes her menstrual losses which were previously adding to her iron deficiency anemia were addressed with her total laparoscopic hysterectomy in December 2022.  Patient is on chronic PPI therapy which also affects her iron absorption in addition to her gastric bypass surgery.  Patient notes fatigue and asking to have her iron replaced aggressively. Patient notes no overt GI bleeding.  No black stools no blood in the stools.   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   Depression    Diabetes mellitus without complication Type 2 diet controlled    Epilepsy ([place])    dx age 59   Habitual abortion history, antepartum    Headache(784.0)    History of diabetes mellitus type 2    prior to gastric bypass in 2012   history of migraine    none since dec 2022   History of recurrent miscarriages, not currently pregnant 10/11/2011   History of sleep apnea    no osa sonce 2013 weight loss after gastric bypass   Hypothyroidism    Infertility associated with anovulation    Obesity    BMi 33   Personal history of COVID-19 07/2020   fever, migraine, extreme lethargy x  1 week all symptoms resolved   Seasonal allergies 08/09/2021   Seizures ([place])    spring 2022 twitching of right hand only   UTI (lower urinary tract infection) 06/2021   after 05-2022 surgery    SURGICAL HISTORY: Past Surgical History:  Procedure Laterality Date   DILATION AND CURETTAGE OF UTERUS     yrs ago per pt on 08-09-2021   GASTRIC BYPASS  06/25/2010   LAPAROSCOPY N/A 08/14/2021   Procedure: LAPAROSCOPY DIAGNOSTIC;  Surgeon: Sanjuana Kava, MD;  Location: [facility];  Service: Gynecology;  Laterality: N/A;   REPAIR VAGINAL CUFF N/A 08/14/2021   Procedure: SECONDARY CLOSURE OF VAGINAL CUFF DEHISCENCE;  Surgeon: Sanjuana Kava, MD;  Location: [facility];  Service: Gynecology;  Laterality: N/A;   TOTAL LAPAROSCOPIC HYSTERECTOMY WITH SALPINGECTOMY N/A 06/22/2021   Procedure: TOTAL LAPAROSCOPIC HYSTERECTOMY WITH BILATERAL SALPINGECTOMY;  Surgeon: Sanjuana Kava, MD;  Location: [facility];  Service: Gynecology;  Laterality: N/A;   WISDOM TOOTH EXTRACTION     as teenager    SOCIAL HISTORY: Social History   Socioeconomic History   Marital status: Single    Spouse name: Not on file   Number of children: Not on file   Years of education: Not on file   Highest education level: Not on file   Occupational History   Not on file  Tobacco Use   Smoking status: Every Day    Packs/day: 0.50    Years: 5.00    Pack years: 2.50    Types: Cigarettes   Smokeless tobacco: Never  Vaping Use   Vaping Use: Never used  Substance and Sexual Activity   Alcohol use: Yes    Comment: on weekends   Drug use: Never   Sexual activity: Not Currently    Birth control/protection: Other-see comments    Comment: nuva ring  Other Topics Concern   Not on file  Social History Narrative   ** Merged History Encounter **       Lives with roomates Right Handed Drinks 1 monster evening twice a day, occasionally coffee twice a day, no soda.     Social Determinants of Health   Financial Resource Strain: Not on file  Food Insecurity: Not on file  Transportation Needs: Not on file  Physical Activity: Not on file  Stress: Not on file  Social Connections: Not on file  Intimate Partner Violence: Not on file    FAMILY HISTORY: Family History  Problem Relation Age of Onset   Depression Mother    Diabetes Mother    Anorexia nervosa Sister    Alcohol abuse Maternal Uncle    Anxiety disorder Maternal Uncle    Depression Maternal Uncle    Alzheimer's disease Maternal Grandfather    Depression Maternal Grandmother    Alzheimer's disease Maternal Grandmother    Diabetes Maternal Grandmother    Alzheimer's disease Paternal Grandmother     ALLERGIES:  is allergic to codeine and codeine.  MEDICATIONS:  Current Outpatient Medications  Medication Sig Dispense Refill   amphetamine-dextroamphetamine (ADDERALL XR) 10 MG 24 hr capsule Take 10 mg by mouth daily at 12 noon.     amphetamine-dextroamphetamine (ADDERALL XR) 20 MG 24 hr capsule Take 20 mg by mouth in the morning. At 600 am     diazepam (VALIUM) 5 MG tablet Take 5 mg by mouth 2 (two) times daily as needed for anxiety.     diclofenac Sodium (VOLTAREN) 1 % GEL Apply 1 application topically 3 (three) times daily as needed (arthritis pain  (hands)).     Esketamine HCl, 84 MG Dose, (SPRAVATO, 84 MG DOSE,) 28 MG/DEVICE SOPK Place into the nose as needed. Prn q 14 days     Ferrous Sulfate (IRON PO) Take 1 tablet by mouth daily at 12 noon.     fexofenadine (ALLEGRA) 180 MG tablet Take 180 mg by mouth daily as needed for allergies or rhinitis.     fluticasone (FLONASE) 50 MCG/ACT nasal spray PLACE 2 SPRAYS INTO BOTH NOSTRILS AT BEDTIME. (Patient taking differently: Place 2 sprays into both nostrils at bedtime as needed.) 48 g 3   ibuprofen (ADVIL) 800 MG tablet Take 1 tablet (800 mg total) by mouth every 8 (eight) hours  as needed for cramping or moderate pain. 60 tablet 3   ibuprofen (ADVIL) 800 MG tablet Take 1 tablet (800 mg total) by mouth every 8 (eight) hours as needed. 30 tablet 0   lamoTRIgine (LAMICTAL) 100 MG tablet Take 1 tablet (100 mg total) by mouth 2 (two) times daily. 180 tablet 4   levothyroxine (SYNTHROID) 125 MCG tablet Take 1 tablet (125 mcg total) by mouth daily. 30 tablet 3   loratadine (CLARITIN) 10 MG tablet Take 10 mg by mouth daily as needed for allergies.     Menthol-Camphor (TIGER BALM ARTHRITIS RUB EX) Apply 1-2 application topically 3 (three) times daily as needed (pain.).     naproxen sodium (ALEVE) 220 MG tablet Take 440 mg by mouth daily as needed.     omeprazole (PRILOSEC) 20 MG capsule as needed.     SUMAtriptan (IMITREX) 100 MG tablet Take 1 tablet (100 mg total) by mouth once as needed for migraine. May repeat in 2 hours if headache persists or recurs. 10 tablet 0   traMADol (ULTRAM-ER) 100 MG 24 hr tablet Take 1 tablet (100 mg total) by mouth every 4 (four) hours as needed for pain. 30 tablet 0   traZODone (DESYREL) 50 MG tablet Take 1 tablet at Bedtime for Sleep (Patient taking differently: at bedtime as needed.) 90 tablet 3   Vitamin D, Ergocalciferol, (DRISDOL) 1.25 MG (50000 UNIT) CAPS capsule Take  1 capsule  weekly  for Vitamin D Deficiency (Patient taking differently: Take  1 capsule  weekly  for  Vitamin D Deficiency FRIDAY) 13 capsule 3   Vortioxetine HBr (TRINTELLIX PO) Take by mouth at bedtime.     No current facility-administered medications for this visit.    REVIEW OF SYSTEMS:    10 Point review of Systems was done is negative except as noted above. PHYSICAL EXAMINATION: ECOG PERFORMANCE STATUS: 1 - Symptomatic but completely ambulatory  . Vitals:   11/16/21 1448  BP: 132/90  Pulse: 93  Resp: 20  Temp: 97.9 F (36.6 C)  SpO2: 100%   Filed Weights   11/16/21 1448  Weight: 138 lb (62.6 kg)   .Body mass index is 21.29 kg/m.  Marland Kitchen GENERAL:alert, in no acute distress and comfortable SKIN: no acute rashes, no significant lesions EYES: conjunctiva are pink and non-injected, sclera anicteric OROPHARYNX: MMM, no exudates, no oropharyngeal erythema or ulceration NECK: supple, no JVD LYMPH:  no palpable lymphadenopathy in the cervical, axillary or inguinal regions LUNGS: clear to auscultation b/l with normal respiratory effort HEART: regular rate & rhythm ABDOMEN:  normoactive bowel sounds , non tender, not distended. Extremity: no pedal edema PSYCH: alert & oriented x 3 with fluent speech NEURO: no focal motor/sensory deficits   LABORATORY DATA:  I have reviewed the data as listed  .    Latest Ref Rng & Units 10/25/2021    1:26 PM 06/23/2021    1:17 AM 06/16/2021   10:59 AM  CBC  WBC 3.4 - 10.8 x10E3/uL 7.9   12.2   7.5    Hemoglobin 11.1 - 15.9 g/dL 14.3   11.9   13.9    Hematocrit 34.0 - 46.6 % 40.3   34.5   40.9    Platelets 150 - 450 x10E3/uL 378   282   293     . CBC    Component Value Date/Time   WBC 7.9 10/25/2021 1326   WBC 12.2 (H) 06/23/2021 0117   RBC 4.32 10/25/2021 1326   RBC 3.51 (L) 06/23/2021  0117   HGB 14.3 10/25/2021 1326   HCT 40.3 10/25/2021 1326   PLT 378 10/25/2021 1326   MCV 93 10/25/2021 1326   MCH 33.1 (H) 10/25/2021 1326   MCH 33.9 06/23/2021 0117   MCHC 35.5 10/25/2021 1326   MCHC 34.5 06/23/2021 0117   RDW 14.1  10/25/2021 1326   LYMPHSABS 3.5 (H) 10/25/2021 1326   EOSABS 0.2 10/25/2021 1326   BASOSABS 0.1 10/25/2021 1326    .    Latest Ref Rng & Units 10/25/2021    1:26 PM 06/16/2021   10:59 AM 02/15/2021    3:53 PM  CMP  Glucose 70 - 99 mg/dL 104   100   102    BUN 6 - 24 mg/dL '11   8   11    '$ Creatinine 0.57 - 1.00 mg/dL 0.72   0.56   0.72    Sodium 134 - 144 mmol/L 137   140   138    Potassium 3.5 - 5.2 mmol/L 4.1   4.2   4.4    Chloride 96 - 106 mmol/L 102   107   99    CO2 20 - 29 mmol/L '23   25   25    '$ Calcium 8.7 - 10.2 mg/dL 9.7   9.4   9.4    Total Protein 6.0 - 8.5 g/dL 7.0    7.2    Total Bilirubin 0.0 - 1.2 mg/dL 0.4    0.8    Alkaline Phos 44 - 121 IU/L 94    91    AST 0 - 40 IU/L 26    54    ALT 0 - 32 IU/L 26    46     . Lab Results  Component Value Date   IRON 31 10/25/2021   TIBC 341 10/25/2021   IRONPCTSAT 9 (LL) 10/25/2021   (Iron and TIBC)  Lab Results  Component Value Date   FERRITIN 42 10/25/2021     RADIOGRAPHIC STUDIES: I have personally reviewed the radiological images as listed and agreed with the findings in the report. No results found.  ASSESSMENT & PLAN:  Lisa [name] is a wonderful 46 y.o. female   1) Recurrent iron deficiency anemia 2)Iron deficiency anemia - not responsive to PO iron PLAN  -Patient has recurrent iron deficiency due to poor iron absorption related to her chronic PPI therapy and previous history of gastric bypass surgery.  She has previously taken oral iron for more than a year without correction of her iron levels. -Her menorrhagia was more of an issue in the past but she has now had hysterectomy in December 2022 which is addressed an element of the iron deficiency which was due to blood loss from menorrhagia. -Her last iron labs showed iron saturation of 9% and ferritin in the 40s.  Her goal is to have the iron saturation more than 20% and ferritin more than 100. -Patient notes fatigue and is keen to treat her iron  deficiency more aggressively.  We ordered 1 dose of IV Injectafer but this was declined by the patient's insurance.  This was switched to Venofer IV 300 mg weekly x2 doses. -Patient will return to [place] in 3 months for repeat labs. -She will continue taking her vitamin B complex 1 capsule p.o. daily and B12 1000 mcg sublingual daily.  Follow-up IV Injectafer x 1 dose Labs in 3 months Phone visit with Dr Irene Limbo the day after these labs  The total time spent  in the appointment was 30 minutes*.  All of the patient's questions were answered with apparent satisfaction. The patient knows to call the clinic with any problems, questions or concerns.   Sullivan Lone MD MS AAHIVMS [facility] Hematology/Oncology Physician [facility]  .*Total Encounter Time as defined by the Centers for Medicare and Medicaid Services includes, in addition to the face-to-face time of a patient visit (documented in the note above) non-face-to-face time: obtaining and reviewing outside history, ordering and reviewing medications, tests or procedures, care coordination (communications with other health care professionals or caregivers) and documentation in the medical record.

## 2021-11-24 ENCOUNTER — Telehealth: Payer: Self-pay | Admitting: Hematology

## 2021-11-24 ENCOUNTER — Inpatient Hospital Stay: Payer: Managed Care, Other (non HMO)

## 2021-11-24 NOTE — Telephone Encounter (Signed)
.  Called patient to schedule appointment per 5/31 inbasket, patient is aware of date and time.   

## 2021-12-01 ENCOUNTER — Other Ambulatory Visit: Payer: Self-pay

## 2021-12-01 ENCOUNTER — Inpatient Hospital Stay: Payer: Managed Care, Other (non HMO) | Attending: Hematology

## 2021-12-01 VITALS — BP 147/89 | HR 88 | Temp 98.2°F | Resp 18

## 2021-12-01 DIAGNOSIS — E119 Type 2 diabetes mellitus without complications: Secondary | ICD-10-CM | POA: Insufficient documentation

## 2021-12-01 DIAGNOSIS — D509 Iron deficiency anemia, unspecified: Secondary | ICD-10-CM | POA: Diagnosis present

## 2021-12-01 DIAGNOSIS — F17210 Nicotine dependence, cigarettes, uncomplicated: Secondary | ICD-10-CM | POA: Insufficient documentation

## 2021-12-01 DIAGNOSIS — E669 Obesity, unspecified: Secondary | ICD-10-CM | POA: Diagnosis not present

## 2021-12-01 DIAGNOSIS — Z9884 Bariatric surgery status: Secondary | ICD-10-CM | POA: Insufficient documentation

## 2021-12-01 DIAGNOSIS — R5383 Other fatigue: Secondary | ICD-10-CM | POA: Insufficient documentation

## 2021-12-01 MED ORDER — LORATADINE 10 MG PO TABS
10.0000 mg | ORAL_TABLET | Freq: Once | ORAL | Status: AC
  Administered 2021-12-01: 10 mg via ORAL
  Filled 2021-12-01: qty 1

## 2021-12-01 MED ORDER — SODIUM CHLORIDE 0.9 % IV SOLN
300.0000 mg | Freq: Once | INTRAVENOUS | Status: AC
  Administered 2021-12-01: 300 mg via INTRAVENOUS
  Filled 2021-12-01: qty 300

## 2021-12-01 MED ORDER — ACETAMINOPHEN 325 MG PO TABS
650.0000 mg | ORAL_TABLET | Freq: Once | ORAL | Status: AC
  Administered 2021-12-01: 650 mg via ORAL
  Filled 2021-12-01: qty 2

## 2021-12-01 MED ORDER — SODIUM CHLORIDE 0.9 % IV SOLN: Freq: Once | INTRAVENOUS | Status: AC

## 2021-12-01 NOTE — Progress Notes (Signed)
Patient tolerated therapy well.  Declined to stay for 21mnutes.  Discharged to lobby in stable condition

## 2021-12-01 NOTE — Patient Instructions (Signed)

## 2021-12-08 ENCOUNTER — Other Ambulatory Visit: Payer: Self-pay

## 2021-12-08 ENCOUNTER — Inpatient Hospital Stay: Payer: Managed Care, Other (non HMO)

## 2021-12-08 VITALS — BP 125/83 | HR 85 | Temp 98.4°F | Resp 16

## 2021-12-08 DIAGNOSIS — D509 Iron deficiency anemia, unspecified: Secondary | ICD-10-CM | POA: Diagnosis not present

## 2021-12-08 MED ORDER — SODIUM CHLORIDE 0.9 % IV SOLN: Freq: Once | INTRAVENOUS | Status: DC | PRN

## 2021-12-08 MED ORDER — SODIUM CHLORIDE 0.9 % IV SOLN
300.0000 mg | Freq: Once | INTRAVENOUS | Status: AC
  Administered 2021-12-08: 300 mg via INTRAVENOUS
  Filled 2021-12-08: qty 300

## 2021-12-08 MED ORDER — ONDANSETRON HCL 4 MG/2ML IJ SOLN: 4.0000 mg | Freq: Once | INTRAMUSCULAR | Status: AC

## 2021-12-08 MED ORDER — LORATADINE 10 MG PO TABS
10.0000 mg | ORAL_TABLET | Freq: Once | ORAL | Status: AC
  Administered 2021-12-08: 10 mg via ORAL
  Filled 2021-12-08: qty 1

## 2021-12-08 MED ORDER — METHYLPREDNISOLONE SODIUM SUCC 125 MG IJ SOLR
125.0000 mg | Freq: Once | INTRAMUSCULAR | Status: AC | PRN
  Administered 2021-12-08: 125 mg via INTRAVENOUS

## 2021-12-08 MED ORDER — ONDANSETRON HCL 4 MG/2ML IJ SOLN
INTRAMUSCULAR | Status: AC
  Administered 2021-12-08: 4 mg via INTRAVENOUS
  Filled 2021-12-08: qty 2

## 2021-12-08 MED ORDER — SODIUM CHLORIDE 0.9 % IV SOLN: Freq: Once | INTRAVENOUS | Status: AC

## 2021-12-08 MED ORDER — FAMOTIDINE IN NACL 20-0.9 MG/50ML-% IV SOLN
20.0000 mg | Freq: Once | INTRAVENOUS | Status: AC | PRN
  Administered 2021-12-08: 20 mg via INTRAVENOUS

## 2021-12-08 MED ORDER — ACETAMINOPHEN 325 MG PO TABS
650.0000 mg | ORAL_TABLET | Freq: Once | ORAL | Status: AC
  Administered 2021-12-08: 650 mg via ORAL
  Filled 2021-12-08: qty 2

## 2021-12-08 MED ORDER — DIPHENHYDRAMINE HCL 50 MG/ML IJ SOLN
50.0000 mg | Freq: Once | INTRAMUSCULAR | Status: AC | PRN
  Administered 2021-12-08: 50 mg via INTRAVENOUS

## 2021-12-08 NOTE — Patient Instructions (Signed)

## 2021-12-08 NOTE — Progress Notes (Signed)
Hypersensitivity Reaction note  Date of event: 12/08/21 Time of event: 0925 Generic name of drug involved: iron sucrose (venofer) Name of provider notified of the hypersensitivity reaction: Irene Limbo Was agent that likely caused hypersensitivity reaction added to Allergies List within EMR? yes Chain of events including reaction signs/symptoms, treatment administered, and outcome (e.g., drug resumed; drug discontinued; sent to Emergency Department; etc.) 20 minutes after venofer began, pt c/o nausea and itching. Infusion stopped. Fluid bolus started. Pt c/o hot flashes, increased nausea, overall "discomfort." Pepcid given. VSS. No change in pt condition. Solumedrol  given. Cipriana.Capra MD at chairside. Pt continues to complain of itching, flushing and severe nausea. Pt begins clearing throat- states 'something feels stuck in my throat.' Benadryl given. VSS. Pt still complaining of severe nausea. States "it feels like a blood sugar spike." CBG 112. Zofran IV given. [place], PA at chairside. VSS. Additional fluid bolus of 541m given, for a total of 10081m and pt states she is beginning to feel better. Pt remained in infusion for 40 more minutes. Discharged in stable condition with family present.   ShGillian ShieldsRN 12/08/2021 11:17 AM

## 2021-12-12 ENCOUNTER — Other Ambulatory Visit: Payer: Self-pay | Admitting: Hematology

## 2021-12-14 ENCOUNTER — Telehealth: Payer: Self-pay | Admitting: Hematology

## 2021-12-14 NOTE — Telephone Encounter (Signed)
Called patient regarding upcoming August appointment, patient did not receive one of her Iron transfusion due to a allergic reaction so we rescheduled another Iron transfusion on 07/07 per patient's request.

## 2021-12-15 ENCOUNTER — Inpatient Hospital Stay: Payer: Managed Care, Other (non HMO)

## 2021-12-15 ENCOUNTER — Other Ambulatory Visit: Payer: Self-pay

## 2021-12-15 VITALS — BP 141/93 | HR 83 | Temp 97.8°F | Resp 18

## 2021-12-15 DIAGNOSIS — D509 Iron deficiency anemia, unspecified: Secondary | ICD-10-CM

## 2021-12-15 MED ORDER — LORATADINE 10 MG PO TABS
10.0000 mg | ORAL_TABLET | Freq: Once | ORAL | Status: AC
  Administered 2021-12-15: 10 mg via ORAL
  Filled 2021-12-15: qty 1

## 2021-12-15 MED ORDER — ACETAMINOPHEN 325 MG PO TABS
650.0000 mg | ORAL_TABLET | Freq: Once | ORAL | Status: AC
  Administered 2021-12-15: 650 mg via ORAL
  Filled 2021-12-15: qty 2

## 2021-12-15 MED ORDER — METHYLPREDNISOLONE SODIUM SUCC 125 MG IJ SOLR
125.0000 mg | Freq: Once | INTRAMUSCULAR | Status: AC | PRN
  Administered 2021-12-15: 40 mg via INTRAVENOUS

## 2021-12-15 MED ORDER — SODIUM CHLORIDE 0.9 % IV SOLN: Freq: Once | INTRAVENOUS | Status: DC | PRN

## 2021-12-15 MED ORDER — METHYLPREDNISOLONE SODIUM SUCC 40 MG IJ SOLR
40.0000 mg | Freq: Once | INTRAMUSCULAR | Status: AC
  Administered 2021-12-15: 40 mg via INTRAVENOUS
  Filled 2021-12-15: qty 1

## 2021-12-15 MED ORDER — SODIUM CHLORIDE 0.9 % IV SOLN
750.0000 mg | Freq: Once | INTRAVENOUS | Status: AC
  Administered 2021-12-15: 750 mg via INTRAVENOUS
  Filled 2021-12-15: qty 15

## 2021-12-15 MED ORDER — DIPHENHYDRAMINE HCL 50 MG/ML IJ SOLN
50.0000 mg | Freq: Once | INTRAMUSCULAR | Status: AC | PRN
  Administered 2021-12-15: 50 mg via INTRAVENOUS

## 2021-12-15 MED ORDER — FAMOTIDINE IN NACL 20-0.9 MG/50ML-% IV SOLN
20.0000 mg | Freq: Once | INTRAVENOUS | Status: AC | PRN
  Administered 2021-12-15: 20 mg via INTRAVENOUS

## 2021-12-15 MED ORDER — SODIUM CHLORIDE 0.9 % IV SOLN: Freq: Once | INTRAVENOUS | Status: AC

## 2021-12-29 ENCOUNTER — Ambulatory Visit: Payer: Managed Care, Other (non HMO)

## 2021-12-29 ENCOUNTER — Other Ambulatory Visit: Payer: Self-pay | Admitting: Nurse Practitioner

## 2021-12-29 DIAGNOSIS — E039 Hypothyroidism, unspecified: Secondary | ICD-10-CM

## 2022-01-04 ENCOUNTER — Other Ambulatory Visit: Payer: Self-pay | Admitting: Adult Health

## 2022-01-04 DIAGNOSIS — E6609 Other obesity due to excess calories: Secondary | ICD-10-CM

## 2022-02-14 ENCOUNTER — Other Ambulatory Visit: Payer: Self-pay

## 2022-02-14 DIAGNOSIS — D509 Iron deficiency anemia, unspecified: Secondary | ICD-10-CM

## 2022-02-15 ENCOUNTER — Encounter: Payer: Self-pay | Admitting: Nurse Practitioner

## 2022-02-15 ENCOUNTER — Inpatient Hospital Stay: Payer: Managed Care, Other (non HMO) | Attending: Hematology

## 2022-02-16 ENCOUNTER — Inpatient Hospital Stay: Payer: Managed Care, Other (non HMO) | Admitting: Hematology

## 2022-02-19 NOTE — Progress Notes (Incomplete)
HEMATOLOGY/ONCOLOGY PHONE VISIT NOTE  Date of Service: 02/16/2022   Patient Care Team: [name], NP as PCP - General (Nurse Practitioner) Unk Pinto, MD as Consulting Physician (Internal Medicine)  CHIEF COMPLAINTS/PURPOSE OF CONSULTATION:  Follow-up for management of iron deficiency anemia  HISTORY OF PRESENTING ILLNESS:   Lisa [name] is a wonderful 46 y.o. female who has been referred to [place] by Dr Carlye Grippe, Raeford Razor, NP for evaluation and management of iron deficiency.   Patient has a history of obesity, diabetes and has had a gastric bypass surgery about 10 years ago.  She was previously referred to [place] in 2018 for management of iron deficiency anemia and received IV iron replacement at the time.  She recently had labs with her primary care physician which in December 2022 showed mild anemia with a hemoglobin of 11.9 with a WBC count of 12.2k and platelets of 282k. Recent labs on 10/25/2021 showed normal hemoglobin of 14.3 with a WBC count of 7.9k and platelets of 378k. Iron labs showed ferritin of 42 with an iron saturation of 9%.  She was referred back to [place] for consideration of IV iron therapy due to previous lack of response to p.o. iron. She notes her menstrual losses which were previously adding to her iron deficiency anemia were addressed with her total laparoscopic hysterectomy in December 2022.  Patient is on chronic PPI therapy which also affects her iron absorption in addition to her gastric bypass surgery.  Patient notes fatigue and asking to have her iron replaced aggressively. Patient notes no overt GI bleeding.  No black stools no blood in the stools.  INTERVAL HISTORY: I connected with Court Joy on 02/19/2022 at 3:30 PM EST by telephone visit and verified that I am speaking with the correct person using two identifiers.   I discussed the limitations, risks, security and privacy concerns of performing an evaluation and management service by  telemedicine and the availability of in-person appointments. I also discussed with the patient that there may be a patient responsible charge related to this service. The patient expressed understanding and agreed to proceed.   Other persons participating in the visit and their role in the encounter: None  Patient's location: Home Provider's location: Lisa [name] is a 46 y.o. female who was contacted via phone for evaluation and management of iron deficiency anemia. She reports She is doing well with no new symptoms or concerns.  ***  She is currently tolerating Venofer IV 300 mg weekly x2 doses well with no prohibitive toxicities.  She notes persistent fatigue that remains unchanged.  No overt GI bleeding. No black or bloody stools. No other new or acute focal symptoms.  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   Depression    Diabetes mellitus without complication Type 2 diet controlled    Epilepsy ([place])    dx age 46   Habitual abortion history, antepartum    Headache(784.0)    History of diabetes mellitus type 2    prior to gastric bypass in 2012   history of migraine    none since dec  2022   History of recurrent miscarriages, not currently pregnant 10/11/2011   History of sleep apnea    no osa sonce 2013 weight loss after gastric bypass   Hypothyroidism    Infertility associated with anovulation    Obesity    BMi 33   Personal history of COVID-19 07/2020   fever, migraine, extreme lethargy x  1 week all symptoms resolved   Seasonal allergies 08/09/2021   Seizures ([place])    spring 2022 twitching of right hand only   UTI  (lower urinary tract infection) 06/2021   after 05-2022 surgery    SURGICAL HISTORY: Past Surgical History:  Procedure Laterality Date   DILATION AND CURETTAGE OF UTERUS     yrs ago per pt on 08-09-2021   GASTRIC BYPASS  06/25/2010   LAPAROSCOPY N/A 08/14/2021   Procedure: LAPAROSCOPY DIAGNOSTIC;  Surgeon: Sanjuana Kava, MD;  Location: [facility];  Service: Gynecology;  Laterality: N/A;   REPAIR VAGINAL CUFF N/A 08/14/2021   Procedure: SECONDARY CLOSURE OF VAGINAL CUFF DEHISCENCE;  Surgeon: Sanjuana Kava, MD;  Location: [facility];  Service: Gynecology;  Laterality: N/A;   TOTAL LAPAROSCOPIC HYSTERECTOMY WITH SALPINGECTOMY N/A 06/22/2021   Procedure: TOTAL LAPAROSCOPIC HYSTERECTOMY WITH BILATERAL SALPINGECTOMY;  Surgeon: Sanjuana Kava, MD;  Location: [facility];  Service: Gynecology;  Laterality: N/A;   WISDOM TOOTH EXTRACTION     as teenager    SOCIAL HISTORY: Social History   Socioeconomic History   Marital status: Single    Spouse name: Not on file   Number of children: Not on file   Years of education: Not on file   Highest education level: Not on file  Occupational History   Not on file  Tobacco Use   Smoking status: Every Day    Packs/day: 0.50    Years: 5.00    Total pack years: 2.50    Types: Cigarettes   Smokeless tobacco: Never  Vaping Use   Vaping Use: Never used  Substance and Sexual Activity   Alcohol use: Yes    Comment: on weekends   Drug use: Never   Sexual activity: Not Currently    Birth control/protection: Other-see comments    Comment: nuva ring  Other Topics Concern   Not on file  Social History Narrative   ** Merged History Encounter **       Lives with roomates Right Handed Drinks 1 monster evening twice a day, occasionally coffee twice a day, no soda.     Social Determinants of Health   Financial Resource Strain: Not on file  Food Insecurity: Not on file  Transportation Needs: Not on file  Physical Activity: Not  on file  Stress: Not on file  Social Connections: Not on file  Intimate Partner Violence: Not on file    FAMILY HISTORY: Family History  Problem Relation Age of Onset   Depression Mother    Diabetes Mother    Anorexia nervosa Sister    Alcohol abuse Maternal Uncle    Anxiety disorder Maternal Uncle    Depression Maternal Uncle    Alzheimer's disease Maternal Grandfather    Depression Maternal Grandmother    Alzheimer's disease Maternal Grandmother    Diabetes Maternal Grandmother    Alzheimer's disease Paternal Grandmother     ALLERGIES:  is allergic to injectafer [ferric carboxymaltose], venofer [iron sucrose], codeine, and codeine.  MEDICATIONS:  Current Outpatient Medications  Medication Sig Dispense Refill   amphetamine-dextroamphetamine (ADDERALL XR) 10 MG 24 hr capsule Take  10 mg by mouth daily at 12 noon.     amphetamine-dextroamphetamine (ADDERALL XR) 20 MG 24 hr capsule Take 20 mg by mouth in the morning. At 600 am     diazepam (VALIUM) 5 MG tablet Take 5 mg by mouth 2 (two) times daily as needed for anxiety.     diclofenac Sodium (VOLTAREN) 1 % GEL Apply 1 application topically 3 (three) times daily as needed (arthritis pain (hands)).     Esketamine HCl, 84 MG Dose, (SPRAVATO, 84 MG DOSE,) 28 MG/DEVICE SOPK Place into the nose as needed. Prn q 14 days     Ferrous Sulfate (IRON PO) Take 1 tablet by mouth daily at 12 noon.     fexofenadine (ALLEGRA) 180 MG tablet Take 180 mg by mouth daily as needed for allergies or rhinitis.     fluticasone (FLONASE) 50 MCG/ACT nasal spray PLACE 2 SPRAYS INTO BOTH NOSTRILS AT BEDTIME. (Patient taking differently: Place 2 sprays into both nostrils at bedtime as needed.) 48 g 3   ibuprofen (ADVIL) 800 MG tablet Take 1 tablet (800 mg total) by mouth every 8 (eight) hours as needed for cramping or moderate pain. 60 tablet 3   ibuprofen (ADVIL) 800 MG tablet Take 1 tablet (800 mg total) by mouth every 8 (eight) hours as needed. 30 tablet 0    lamoTRIgine (LAMICTAL) 100 MG tablet Take 1 tablet (100 mg total) by mouth 2 (two) times daily. 180 tablet 4   levothyroxine (SYNTHROID) 125 MCG tablet TAKE 1 TABLET BY MOUTH DAILY 90 tablet 3   loratadine (CLARITIN) 10 MG tablet Take 10 mg by mouth daily as needed for allergies.     Menthol-Camphor (TIGER BALM ARTHRITIS RUB EX) Apply 1-2 application topically 3 (three) times daily as needed (pain.).     naproxen sodium (ALEVE) 220 MG tablet Take 440 mg by mouth daily as needed.     omeprazole (PRILOSEC) 20 MG capsule as needed.     SUMAtriptan (IMITREX) 100 MG tablet Take 1 tablet (100 mg total) by mouth once as needed for migraine. May repeat in 2 hours if headache persists or recurs. 10 tablet 0   traMADol (ULTRAM-ER) 100 MG 24 hr tablet Take 1 tablet (100 mg total) by mouth every 4 (four) hours as needed for pain. 30 tablet 0   traZODone (DESYREL) 50 MG tablet Take 1 tablet at Bedtime for Sleep (Patient taking differently: at bedtime as needed.) 90 tablet 3   Vitamin D, Ergocalciferol, (DRISDOL) 1.25 MG (50000 UNIT) CAPS capsule Take  1 capsule  weekly  for Vitamin D Deficiency (Patient taking differently: Take  1 capsule  weekly  for Vitamin D Deficiency FRIDAY) 13 capsule 3   Vortioxetine HBr (TRINTELLIX PO) Take by mouth at bedtime.     No current facility-administered medications for this visit.    REVIEW OF SYSTEMS:    10 Point review of Systems was done is negative except as noted above. PHYSICAL EXAMINATION: Telemedicine appointment  LABORATORY DATA:  I have reviewed the data as listed  .    Latest Ref Rng & Units 10/25/2021    1:26 PM 06/23/2021    1:17 AM 06/16/2021   10:59 AM  CBC  WBC 3.4 - 10.8 x10E3/uL 7.9  12.2  7.5   Hemoglobin 11.1 - 15.9 g/dL 14.3  11.9  13.9   Hematocrit 34.0 - 46.6 % 40.3  34.5  40.9   Platelets 150 - 450 x10E3/uL 378  282  293    .  CBC    Component Value Date/Time   WBC 7.9 10/25/2021 1326   WBC 12.2 (H) 06/23/2021 0117   RBC 4.32  10/25/2021 1326   RBC 3.51 (L) 06/23/2021 0117   HGB 14.3 10/25/2021 1326   HCT 40.3 10/25/2021 1326   PLT 378 10/25/2021 1326   MCV 93 10/25/2021 1326   MCH 33.1 (H) 10/25/2021 1326   MCH 33.9 06/23/2021 0117   MCHC 35.5 10/25/2021 1326   MCHC 34.5 06/23/2021 0117   RDW 14.1 10/25/2021 1326   LYMPHSABS 3.5 (H) 10/25/2021 1326   EOSABS 0.2 10/25/2021 1326   BASOSABS 0.1 10/25/2021 1326    .    Latest Ref Rng & Units 10/25/2021    1:26 PM 06/16/2021   10:59 AM 02/15/2021    3:53 PM  CMP  Glucose 70 - 99 mg/dL 104  100  102   BUN 6 - 24 mg/dL '11  8  11   '$ Creatinine 0.57 - 1.00 mg/dL 0.72  0.56  0.72   Sodium 134 - 144 mmol/L 137  140  138   Potassium 3.5 - 5.2 mmol/L 4.1  4.2  4.4   Chloride 96 - 106 mmol/L 102  107  99   CO2 20 - 29 mmol/L '23  25  25   '$ Calcium 8.7 - 10.2 mg/dL 9.7  9.4  9.4   Total Protein 6.0 - 8.5 g/dL 7.0   7.2   Total Bilirubin 0.0 - 1.2 mg/dL 0.4   0.8   Alkaline Phos 44 - 121 IU/L 94   91   AST 0 - 40 IU/L 26   54   ALT 0 - 32 IU/L 26   46    . Lab Results  Component Value Date   IRON 31 10/25/2021   TIBC 341 10/25/2021   IRONPCTSAT 9 (LL) 10/25/2021   (Iron and TIBC)  Lab Results  Component Value Date   FERRITIN 42 10/25/2021     RADIOGRAPHIC STUDIES: I have personally reviewed the radiological images as listed and agreed with the findings in the report. No results found.  ASSESSMENT & PLAN:  NEESA KNAPIK is a wonderful 46 y.o. female   1) Recurrent iron deficiency anemia 2)Iron deficiency anemia - not responsive to PO iron PLAN -Patient has recurrent iron deficiency due to poor iron absorption related to her chronic PPI therapy and previous history of gastric bypass surgery.  She has previously taken oral iron for more than a year without correction of her iron levels. -Her menorrhagia was more of an issue in the past but she has now had hysterectomy in December 2022 which is addressed an element of the iron deficiency which was  due to blood loss from menorrhagia. -Her goal is to have the iron saturation more than 20% and ferritin more than 100. -She is currently tolerating Venofer IV 300 mg weekly x2 doses well with no prohibitive toxicities. -She will continue taking her vitamin B complex 1 capsule p.o. daily and B12 1000 mcg sublingual daily.  Follow-up ***  The total time spent in the appointment was *** minutes*.  All of the patient's questions were answered with apparent satisfaction. The patient knows to call the clinic with any problems, questions or concerns.   Sullivan Lone MD MS AAHIVMS [facility] Hematology/Oncology Physician [facility]  .*Total Encounter Time as defined by the Centers for Medicare and Medicaid Services includes, in addition to the face-to-face time of a patient visit (documented in  the note above) non-face-to-face time: obtaining and reviewing outside history, ordering and reviewing medications, tests or procedures, care coordination (communications with other health care professionals or caregivers) and documentation in the medical record.  I, Melene Muller, am acting as scribe for Dr. Sullivan Lone, MD.

## 2022-02-22 NOTE — Progress Notes (Deleted)
Assessment and Plan:  There are no diagnoses linked to this encounter.    Further disposition pending results of labs. Discussed med's effects and SE's.   Over 30 minutes of exam, counseling, chart review, and critical decision making was performed.   Future Appointments  Date Time Provider [place]  02/27/2022  3:30 PM [name], NP [facility] None  03/13/2022 10:00 AM [name], NP [facility] None  05/23/2022 10:15 AM Alric Ran, MD [facility] None    ------------------------------------------------------------------------------------------------------------------   HPI LMP 08/23/2020  46 y.o.female presents for  Past Medical History:  Diagnosis Date   Abdominal cramping and spotting since 12 -2022 surgery 08/10/2011   Acute vaginitis 08/09/2021   ADHD (attention deficit hyperactivity disorder)    Anemia    Anxiety    Childhood Asthma    resolved   Complication of anesthesia    disorientation upon awakening 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   Depression    Diabetes mellitus without complication Type 2 diet controlled    Epilepsy ([place])    dx age 41   Habitual abortion history, antepartum    Headache(784.0)    History of diabetes mellitus type 2    prior to gastric bypass in 2012   history of migraine    none since dec 2022   History of recurrent miscarriages, not currently pregnant 10/11/2011   History of sleep apnea    no osa sonce 2013 weight loss after gastric bypass   Hypothyroidism    Infertility associated with anovulation    Obesity    BMi 33   Personal history of COVID-19 07/2020   fever, migraine, extreme lethargy x  1 week all symptoms resolved   Seasonal allergies 08/09/2021   Seizures ([place])    spring 2022 twitching of right hand only   UTI (lower urinary tract infection) 06/2021   after 05-2022 surgery     Allergies  Allergen Reactions   Injectafer [Ferric Carboxymaltose] Other (See Comments)    Pt had  hypersensitivity rxn to [facility]. See progress note from 12/15/21 at 1011. Pt able to complete infusion.    Venofer [Iron Sucrose] Anaphylaxis, Itching, Nausea Only, Other (See Comments) and Cough    Reaction to Venofer. See progress note 12/08/21. Unable to complete infusion. Extreme flushing; itching to body and throat, severe nausea.   Codeine Itching   Codeine Itching, Rash and Other (See Comments)    OPIOIDS - MORPHINE & RELATED CAN TAKE TRAMADOL CAN TAKE DEMEROL CAN TAKE    Current Outpatient Medications on File Prior to Visit  Medication Sig   amphetamine-dextroamphetamine (ADDERALL XR) 10 MG 24 hr capsule Take 10 mg by mouth daily at 12 noon.   amphetamine-dextroamphetamine (ADDERALL XR) 20 MG 24 hr capsule Take 20 mg by mouth in the morning. At 600 am   diazepam (VALIUM) 5 MG tablet Take 5 mg by mouth 2 (two) times daily as needed for anxiety.   diclofenac Sodium (VOLTAREN) 1 % GEL Apply 1 application topically 3 (three) times daily as needed (arthritis pain (hands)).   Esketamine HCl, 84 MG Dose, (SPRAVATO, 84 MG DOSE,) 28 MG/DEVICE SOPK Place into the nose as needed. Prn q 14 days   Ferrous Sulfate (IRON PO) Take 1 tablet by mouth daily at 12 noon.   fexofenadine (ALLEGRA) 180 MG tablet Take 180 mg by mouth daily as needed for allergies or rhinitis.   fluticasone (FLONASE) 50 MCG/ACT nasal spray PLACE 2 SPRAYS INTO BOTH NOSTRILS AT BEDTIME. (Patient  taking differently: Place 2 sprays into both nostrils at bedtime as needed.)   ibuprofen (ADVIL) 800 MG tablet Take 1 tablet (800 mg total) by mouth every 8 (eight) hours as needed for cramping or moderate pain.   ibuprofen (ADVIL) 800 MG tablet Take 1 tablet (800 mg total) by mouth every 8 (eight) hours as needed.   lamoTRIgine (LAMICTAL) 100 MG tablet Take 1 tablet (100 mg total) by mouth 2 (two) times daily.   levothyroxine (SYNTHROID) 125 MCG tablet TAKE 1 TABLET BY MOUTH DAILY   loratadine (CLARITIN) 10 MG tablet Take 10 mg by  mouth daily as needed for allergies.   Menthol-Camphor (TIGER BALM ARTHRITIS RUB EX) Apply 1-2 application topically 3 (three) times daily as needed (pain.).   naproxen sodium (ALEVE) 220 MG tablet Take 440 mg by mouth daily as needed.   omeprazole (PRILOSEC) 20 MG capsule as needed.   SUMAtriptan (IMITREX) 100 MG tablet Take 1 tablet (100 mg total) by mouth once as needed for migraine. May repeat in 2 hours if headache persists or recurs.   traMADol (ULTRAM-ER) 100 MG 24 hr tablet Take 1 tablet (100 mg total) by mouth every 4 (four) hours as needed for pain.   traZODone (DESYREL) 50 MG tablet Take 1 tablet at Bedtime for Sleep (Patient taking differently: at bedtime as needed.)   Vitamin D, Ergocalciferol, (DRISDOL) 1.25 MG (50000 UNIT) CAPS capsule Take  1 capsule  weekly  for Vitamin D Deficiency (Patient taking differently: Take  1 capsule  weekly  for Vitamin D Deficiency FRIDAY)   Vortioxetine HBr (TRINTELLIX PO) Take by mouth at bedtime.   No current facility-administered medications on file prior to visit.    ROS: all negative except above.   Physical Exam:  LMP 08/23/2020   General Appearance: Well nourished, in no apparent distress. Eyes: PERRLA, EOMs, conjunctiva no swelling or erythema Sinuses: No Frontal/maxillary tenderness ENT/Mouth: Ext aud canals clear, TMs without erythema, bulging. No erythema, swelling, or exudate on post pharynx.  Tonsils not swollen or erythematous. Hearing normal.  Neck: Supple, thyroid normal.  Respiratory: Respiratory effort normal, BS equal bilaterally without rales, rhonchi, wheezing or stridor.  Cardio: RRR with no MRGs. Brisk peripheral pulses without edema.  Abdomen: Soft, + BS.  Non tender, no guarding, rebound, hernias, masses. Lymphatics: Non tender without lymphadenopathy.  Musculoskeletal: Full ROM, 5/5 strength, normal gait.  Skin: Warm, dry without rashes, lesions, ecchymosis.  Neuro: Cranial nerves intact. Normal muscle tone, no  cerebellar symptoms. Sensation intact.  Psych: Awake and oriented X 3, normal affect, Insight and Judgment appropriate.     [name], NP 10:04 AM [facility]

## 2022-02-27 ENCOUNTER — Ambulatory Visit: Payer: Managed Care, Other (non HMO) | Admitting: Nurse Practitioner

## 2022-03-01 ENCOUNTER — Encounter: Payer: Self-pay | Admitting: Nurse Practitioner

## 2022-03-12 NOTE — Progress Notes (Deleted)
CPE Assessment and Plan: Type 2 diabetes mellitus with hyperlipidemia ([place]) Off meds with weight loss, will restart ozemipic if needed  Hypothyroidism, unspecified type Hypothyroidism-check TSH level, continue medications the same, reminded to take on an empty stomach 30-101mns before food.  With weight loss may need to cut back on medication  Seizure disorder ([place] Continue medications- may need increase Will check valproic acid level Stop ETOH- on neltrexone ? Lowered seizure threshold with ETOH/lack of sleep Will refer to neuro for follow up  Recurrent major depressive disorder, in partial remission ([place] Continue to see psych  Medication management  Obesity surgery status At goal weight, continue to increase exercise  Vitamin D deficiency Continue supplement  COPD Via CXR Will send in chantix for patient to start  Discussed med's effects and SE's. Screening labs and tests as requested with regular follow-up as recommended. LAB DONE AT [facility], HAS RX FOR LABS AND WILL GET DONE Future Appointments  Date Time Provider [place] 03/13/2022 10:00 AM WAlycia Rossetti NP [facility] None  05/23/2022 10:15 AM CAlric Ran MD [facility] None  03/14/2023 10:00 AM WAlycia Rossetti NP [facility] None   zz HPI  46y.o. female  presents for  follow up for chol, DM, seizures, anemia.    Her blood pressure has been controlled at home, today their BP is   She does workout she is walking more due to new puppy, lily.  She denies chest pain, shortness of breath, dizziness.  She had a negative rheumatoid work up  She has been having more nausea/vomiting and heart burn, ie with pot pie, pizza, ribs and due to elevated labs she had an AB UKoreathat showed sludge without evidence of cholecysitis, and shadowing of right lobe of the liver that they recommended CT AB pelvis- patient declines at this time.  IMPRESSION: 1. Markedly limited study secondary to overlying bowel gas. 2.  Moderate amount of gallbladder sludge without evidence of cholelithiasis or acute cholecystitis. 3. Shadowing echogenic focus within the right lobe of the liver which may represent an area of parenchymal calcification. Correlation with follow-up nonemergent abdomen pelvis CT is recommended.  She has had epilepsy since she was 89 followed with Dr. PMinna Antisin the past, on valproic acid '250mg'$  takes '750mg'$  BID, had a trial off valproic acid but had seizures, she had not had a seizure in over a decade until recently. She reports 1 episode of possible absence seizure and 1 episode of possible tonic-clonic seizure. She states she has had extra stress with moving into a new house, work and admits to drinking too much alcohol, both episodes were after heavy drinking the night before  She was advised to stop drinking, given naltrexone to help, has never had DTs in the past.  She has been advised to not drive long distances and follow up with neuro for clearance, her medication will be increased but it is possible these are provoked from ETOH/lack of sleep lowering seizure threshold. She lives very close to her parents and has discussed wanting to drive to her parents house. Since not drinking she is feeling much better, no more seizures.   She has a productive cough, mostly in the morning. She is still smoking.  CXR showed Mild hyperinflation without acute finding.  She is 6 years s/p gastric bypass, she was 260 before the surgery and got down to 167.  She is now at 156 lbs which was lower than her surgery.  BMI is There is no  height or weight on file to calculate BMI., she is working on diet and exercise. Wt Readings from Last 3 Encounters:  11/16/21 138 lb (62.6 kg)  10/25/21 136 lb 3.2 oz (61.8 kg)  08/14/21 137 lb 9.6 oz (62.4 kg)    She is not on cholesterol medication and denies myalgias. Her cholesterol is at goal. The cholesterol last visit was:   Lab Results  Component Value Date   CHOL 242 (H)  10/25/2021   HDL 123 10/25/2021   LDLCALC 109 (H) 10/25/2021   TRIG 59 10/25/2021   CHOLHDL 1.8 03/23/2021    She has been working on diet and exercise for diabetes  with hyperlipidemia not on medications Without CKD She was on ozempic 1 mg once a week but with weight loss we are doing a trial off but will restart if she needs to No CAD, CHF, or PVD denies paresthesia of the feet, polydipsia, polyuria and visual disturbances.  Last A1C in the office was:  Lab Results  Component Value Date   HGBA1C 5.1 10/25/2021     Lab Results  Component Value Date   GFRNONAA >60 06/16/2021   Patient is on Vitamin D supplement.   Lab Results  Component Value Date   VD25OH 48.6 01/19/2020     She is on thyroid medication. Her medication was not changed last visit.   Lab Results  Component Value Date   TSH 12.500 (H) 10/25/2021  .   She is on trazodone for sleep, takes PRN, uses once a month.  She is on the valium PRN as well for anxiety.   She is on the [place] ring and is on it continuously. She has a history of miscarriages and infertility HOWEVER has seen her GYN.   Due to her gastric bypass she has has severe symptomatic iron def anemia, she has had an iron transfusion in the past and remains on iron at this time and is feeling better.  Lab Results  Component Value Date   IRON 31 10/25/2021   TIBC 341 10/25/2021   FERRITIN 42 10/25/2021     Current Outpatient Medications (Endocrine & Metabolic):    levothyroxine (SYNTHROID) 125 MCG tablet, TAKE 1 TABLET BY MOUTH DAILY   Current Outpatient Medications (Respiratory):    fexofenadine (ALLEGRA) 180 MG tablet, Take 180 mg by mouth daily as needed for allergies or rhinitis.   fluticasone (FLONASE) 50 MCG/ACT nasal spray, PLACE 2 SPRAYS INTO BOTH NOSTRILS AT BEDTIME. (Patient taking differently: Place 2 sprays into both nostrils at bedtime as needed.)   loratadine (CLARITIN) 10 MG tablet, Take 10 mg by mouth daily as needed for  allergies.  Current Outpatient Medications (Analgesics):    ibuprofen (ADVIL) 800 MG tablet, Take 1 tablet (800 mg total) by mouth every 8 (eight) hours as needed for cramping or moderate pain.   ibuprofen (ADVIL) 800 MG tablet, Take 1 tablet (800 mg total) by mouth every 8 (eight) hours as needed.   naproxen sodium (ALEVE) 220 MG tablet, Take 440 mg by mouth daily as needed.   SUMAtriptan (IMITREX) 100 MG tablet, Take 1 tablet (100 mg total) by mouth once as needed for migraine. May repeat in 2 hours if headache persists or recurs.   traMADol (ULTRAM-ER) 100 MG 24 hr tablet, Take 1 tablet (100 mg total) by mouth every 4 (four) hours as needed for pain.  Current Outpatient Medications (Hematological):    Ferrous Sulfate (IRON PO), Take 1 tablet by mouth daily at 12 noon.  Current Outpatient Medications (Other):    amphetamine-dextroamphetamine (ADDERALL XR) 10 MG 24 hr capsule, Take 10 mg by mouth daily at 12 noon.   amphetamine-dextroamphetamine (ADDERALL XR) 20 MG 24 hr capsule, Take 20 mg by mouth in the morning. At 600 am   diazepam (VALIUM) 5 MG tablet, Take 5 mg by mouth 2 (two) times daily as needed for anxiety.   diclofenac Sodium (VOLTAREN) 1 % GEL, Apply 1 application topically 3 (three) times daily as needed (arthritis pain (hands)).   Esketamine HCl, 84 MG Dose, (SPRAVATO, 84 MG DOSE,) 28 MG/DEVICE SOPK, Place into the nose as needed. Prn q 14 days   lamoTRIgine (LAMICTAL) 100 MG tablet, Take 1 tablet (100 mg total) by mouth 2 (two) times daily.   Menthol-Camphor (TIGER BALM ARTHRITIS RUB EX), Apply 1-2 application topically 3 (three) times daily as needed (pain.).   omeprazole (PRILOSEC) 20 MG capsule, as needed.   traZODone (DESYREL) 50 MG tablet, Take 1 tablet at Bedtime for Sleep (Patient taking differently: at bedtime as needed.)   Vitamin D, Ergocalciferol, (DRISDOL) 1.25 MG (50000 UNIT) CAPS capsule, Take  1 capsule  weekly  for Vitamin D Deficiency (Patient taking  differently: Take  1 capsule  weekly  for Vitamin D Deficiency FRIDAY)   Vortioxetine HBr (TRINTELLIX PO), Take by mouth at bedtime.  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   Depression    Diabetes mellitus without complication Type 2 diet controlled    Epilepsy ([place])    dx age 38   Habitual abortion history, antepartum    Headache(784.0)    History of diabetes mellitus type 2    prior to gastric bypass in 2012   history of migraine    none since dec 2022   History of recurrent miscarriages, not currently pregnant 10/11/2011   History of sleep apnea    no osa sonce 2013 weight loss after gastric bypass   Hypothyroidism    Infertility associated with anovulation    Obesity    BMi 33   Personal history of COVID-19 07/2020   fever, migraine, extreme lethargy x  1 week all symptoms resolved   Seasonal allergies 08/09/2021   Seizures ([place])    spring 2022 twitching of right hand only   UTI (lower urinary tract infection) 06/2021   after 05-2022 surgery   Allergies Allergies  Allergen Reactions   Injectafer [Ferric Carboxymaltose] Other (See Comments)    Pt had hypersensitivity rxn to [facility]. See progress note from 12/15/21 at 1011. Pt able to complete infusion.    Venofer [Iron Sucrose] Anaphylaxis, Itching, Nausea Only, Other (See Comments) and Cough    Reaction to Venofer. See progress note 12/08/21. Unable to complete infusion. Extreme flushing; itching to body and throat, severe nausea.   Codeine Itching   Codeine Itching, Rash and Other (See Comments)    OPIOIDS - MORPHINE & RELATED CAN TAKE TRAMADOL CAN TAKE DEMEROL CAN TAKE   Immunization History   Administered Date(s) Administered   HPV 9-valent 04/07/2020   Influenza, Seasonal, Injecte, Preservative Fre 04/30/2016   Influenza,inj,Quad PF,6+ Mos 05/24/2017, 04/10/2018   Influenza,inj,quad, With Preservative 04/26/2015  PFIZER(Purple Top)SARS-COV-2 Vaccination 09/24/2019, 10/21/2019, 05/25/2020   Pneumococcal Polysaccharide-23 02/12/2019   Tdap 07/16/2017   Health Maintenance  Topic Date Due   FOOT EXAM  Never done   OPHTHALMOLOGY EXAM  Never done   HIV Screening  Never done   Diabetic kidney evaluation - Urine ACR  Never done   Hepatitis C Screening  Never done   HPV VACCINES (2 - Risk 3-dose SCDM series) 05/05/2020   COVID-19 Vaccine (4 - Pfizer risk series) 07/20/2020   COLONOSCOPY (Pts 45-32yr Insurance coverage will need to be confirmed)  Never done   INFLUENZA VACCINE  01/23/2022   HEMOGLOBIN A1C  04/27/2022   Diabetic kidney evaluation - GFR measurement  10/26/2022   PAP SMEAR-Modifier  03/22/2023   TETANUS/TDAP  07/17/2027     SURGICAL HISTORY She  has a past surgical history that includes Gastric bypass (06/25/2010); Dilation and curettage of uterus; Wisdom tooth extraction; Total laparoscopic hysterectomy with salpingectomy (N/A, 06/22/2021); laparoscopy (N/A, 08/14/2021); and Repair vaginal cuff (N/A, 08/14/2021). FAMILY HISTORY Her family history includes Alcohol abuse in her maternal uncle; Alzheimer's disease in her maternal grandfather, maternal grandmother, and paternal grandmother; Anorexia nervosa in her sister; Anxiety disorder in her maternal uncle; Depression in her maternal grandmother, maternal uncle, and mother; Diabetes in her maternal grandmother and mother. SOCIAL HISTORY She  reports that she has been smoking cigarettes. She has a 2.50 pack-year smoking history. She has never used smokeless tobacco. She reports current alcohol use. She reports that she does not use drugs.  Review of Systems: Review of Systems  Constitutional:  Negative for  chills, diaphoresis, fever, malaise/fatigue and weight loss.  HENT: Negative.    Respiratory: Negative.    Cardiovascular: Negative.   Gastrointestinal:  Negative for abdominal pain, blood in stool, constipation, diarrhea, heartburn, melena, nausea and vomiting.  Genitourinary: Negative.   Musculoskeletal: Negative.   Skin: Negative.   Neurological: Negative.  Negative for weakness.  Psychiatric/Behavioral:  Negative for depression, hallucinations, memory loss, substance abuse and suicidal ideas. The patient is not nervous/anxious and does not have insomnia.     Physical Exam: Estimated body mass index is 21.29 kg/m as calculated from the following:   Height as of 08/14/21: 5' 7.5" (1.715 m).   Weight as of 11/16/21: 138 lb (62.6 kg). LMP 08/23/2020  General Appearance: Well nourished, in no apparent distress.  Eyes: PERRLA, EOMs, conjunctiva no swelling or erythema, normal fundi and vessels.  Sinuses: No Frontal/maxillary tenderness  ENT/Mouth: Ext aud canals clear, normal light reflex with TMs without erythema, bulging. Good dentition. No erythema, swelling, or exudate on post pharynx. Tonsils not swollen or erythematous. Hearing normal. Crowded mouth.  Neck: Supple, thyroid normal. No bruits  Respiratory: Respiratory effort normal, BS equal bilaterally without rales, rhonchi, wheezing or stridor.  Cardio: RRR without murmurs, rubs or gallops. Prominent S2. Brisk peripheral pulses without edema.  Chest: symmetric, with normal excursions and percussion.  Abdomen: Soft, nontender, no guarding, rebound, hernias, masses, or organomegaly. .  Lymphatics: Non tender without lymphadenopathy.  Musculoskeletal: Full ROM all peripheral extremities,5/5 strength, and normal gait.  Skin: Warm, dry without rashes, lesions, ecchymosis. Neuro: Cranial nerves intact, reflexes equal bilaterally. Normal muscle tone, no cerebellar symptoms. Sensation intact.  Psych: Awake and oriented X 3, normal affect,  Insight and Judgment appropriate.    Lisa [name]  1:24 PM [facility]

## 2022-03-13 ENCOUNTER — Encounter: Payer: Managed Care, Other (non HMO) | Admitting: Nurse Practitioner

## 2022-03-13 DIAGNOSIS — Z1389 Encounter for screening for other disorder: Secondary | ICD-10-CM

## 2022-03-13 DIAGNOSIS — Z79899 Other long term (current) drug therapy: Secondary | ICD-10-CM

## 2022-03-13 DIAGNOSIS — F3341 Major depressive disorder, recurrent, in partial remission: Secondary | ICD-10-CM

## 2022-03-13 DIAGNOSIS — J439 Emphysema, unspecified: Secondary | ICD-10-CM

## 2022-03-13 DIAGNOSIS — Z136 Encounter for screening for cardiovascular disorders: Secondary | ICD-10-CM

## 2022-03-13 DIAGNOSIS — G40909 Epilepsy, unspecified, not intractable, without status epilepticus: Secondary | ICD-10-CM

## 2022-03-13 DIAGNOSIS — E039 Hypothyroidism, unspecified: Secondary | ICD-10-CM

## 2022-03-13 DIAGNOSIS — Z9884 Bariatric surgery status: Secondary | ICD-10-CM

## 2022-03-13 DIAGNOSIS — E559 Vitamin D deficiency, unspecified: Secondary | ICD-10-CM

## 2022-03-13 DIAGNOSIS — E6609 Other obesity due to excess calories: Secondary | ICD-10-CM

## 2022-03-13 DIAGNOSIS — Z0001 Encounter for general adult medical examination with abnormal findings: Secondary | ICD-10-CM

## 2022-03-13 DIAGNOSIS — E785 Hyperlipidemia, unspecified: Secondary | ICD-10-CM

## 2022-03-13 DIAGNOSIS — Z124 Encounter for screening for malignant neoplasm of cervix: Secondary | ICD-10-CM

## 2022-03-13 DIAGNOSIS — E1169 Type 2 diabetes mellitus with other specified complication: Secondary | ICD-10-CM

## 2022-03-22 ENCOUNTER — Encounter: Payer: Self-pay | Admitting: Nurse Practitioner

## 2022-03-28 NOTE — Progress Notes (Signed)
CPE Assessment and Plan: Type 2 diabetes mellitus with hyperlipidemia ([place]) Off meds with weight loss, will restart ozemipic if needed  Hypothyroidism, unspecified type Hypothyroidism-check TSH level, continue medications the same, reminded to take on an empty stomach 30-59mns before food.  With weight loss may need to cut back on medication  Seizure disorder ([place] Continue medications- may need increase Will check valproic acid level Stop ETOH- on neltrexone ? Lowered seizure threshold with ETOH/lack of sleep Will refer to neuro for follow up  Recurrent major depressive disorder, in partial remission ([place] Continue to see psych  Medication management  Obesity surgery status, S/P gastric bypass At goal weight, continue to increase exercise  Vitamin D deficiency Continue supplement  COPD Via CXR Will send in chantix for patient to start  Discussed med's effects and SE's. Screening labs and tests as requested with regular follow-up as recommended. LAB DONE AT [facility], HAS RX FOR LABS AND WILL GET DONE Future Appointments  Date Time Provider [place] 03/29/2022 10:00 AM WAlycia Rossetti NP [facility] None  05/23/2022 10:15 AM CAlric Ran MD [facility] None   zz HPI  46y.o. female  presents for  follow up for chol, DM, seizures, anemia.    Her blood pressure has been controlled at home, today their BP is   She does workout she is walking more due to new puppy, lily.  She denies chest pain, shortness of breath, dizziness.  She had a negative rheumatoid work up  She has been having more nausea/vomiting and heart burn, ie with pot pie, pizza, ribs and due to elevated labs she had an AB [place]at showed sludge without evidence of cholecysitis, and shadowing of right lobe of the liver that they recommended CT AB pelvis- patient declines at this time.  IMPRESSION: 1. Markedly limited study secondary to overlying bowel gas. 2. Moderate amount of gallbladder sludge  without evidence of cholelithiasis or acute cholecystitis. 3. Shadowing echogenic focus within the right lobe of the liver which may represent an area of parenchymal calcification. Correlation with follow-up nonemergent abdomen pelvis CT is recommended.  She has had epilepsy since she was 89 followed with Dr. PMinna Antisin the past, on valproic acid '250mg'$  takes '750mg'$  BID, had a trial off valproic acid but had seizures, she had not had a seizure in over a decade until recently. She reports 1 episode of possible absence seizure and 1 episode of possible tonic-clonic seizure. She states she has had extra stress with moving into a new house, work and admits to drinking too much alcohol, both episodes were after heavy drinking the night before  She was advised to stop drinking, given naltrexone to help, has never had DTs in the past.  She has been advised to not drive long distances and follow up with neuro for clearance, her medication will be increased but it is possible these are provoked from ETOH/lack of sleep lowering seizure threshold. She lives very close to her parents and has discussed wanting to drive to her parents house. Since not drinking she is feeling much better, no more seizures.   She has a productive cough, mostly in the morning. She is still smoking.  CXR showed Mild hyperinflation without acute finding.  She is 6 years s/p gastric bypass, she was 260 before the surgery and got down to 167.  She is now at 156 lbs which was lower than her surgery.  BMI is There is no height or weight on file to calculate  BMI., she is working on diet and exercise. Wt Readings from Last 3 Encounters:  11/16/21 138 lb (62.6 kg)  10/25/21 136 lb 3.2 oz (61.8 kg)  08/14/21 137 lb 9.6 oz (62.4 kg)    She is not on cholesterol medication and denies myalgias. Her cholesterol is at goal. The cholesterol last visit was:   Lab Results  Component Value Date   CHOL 242 (H) 10/25/2021   HDL 123 10/25/2021    LDLCALC 109 (H) 10/25/2021   TRIG 59 10/25/2021   CHOLHDL 1.8 03/23/2021    She has been working on diet and exercise for diabetes  with hyperlipidemia not on medications Without CKD She was on ozempic 1 mg once a week but with weight loss we are doing a trial off but will restart if she needs to No CAD, CHF, or PVD denies paresthesia of the feet, polydipsia, polyuria and visual disturbances.  Last A1C in the office was:  Lab Results  Component Value Date   HGBA1C 5.1 10/25/2021     Lab Results  Component Value Date   GFRNONAA >60 06/16/2021   Patient is on Vitamin D supplement.   Lab Results  Component Value Date   VD25OH 48.6 01/19/2020     She is on thyroid medication. Her medication was not changed last visit.   Lab Results  Component Value Date   TSH 12.500 (H) 10/25/2021  .   She is on trazodone for sleep, takes PRN, uses once a month.  She is on the valium PRN as well for anxiety.   She is on the [place] ring and is on it continuously. She has a history of miscarriages and infertility HOWEVER has seen her GYN.   Due to her gastric bypass she has has severe symptomatic iron def anemia, she has had an iron transfusion in the past and remains on iron at this time and is feeling better.  Lab Results  Component Value Date   IRON 31 10/25/2021   TIBC 341 10/25/2021   FERRITIN 42 10/25/2021     Current Outpatient Medications (Endocrine & Metabolic):    levothyroxine (SYNTHROID) 125 MCG tablet, TAKE 1 TABLET BY MOUTH DAILY   Current Outpatient Medications (Respiratory):    fexofenadine (ALLEGRA) 180 MG tablet, Take 180 mg by mouth daily as needed for allergies or rhinitis.   fluticasone (FLONASE) 50 MCG/ACT nasal spray, PLACE 2 SPRAYS INTO BOTH NOSTRILS AT BEDTIME. (Patient taking differently: Place 2 sprays into both nostrils at bedtime as needed.)   loratadine (CLARITIN) 10 MG tablet, Take 10 mg by mouth daily as needed for allergies.  Current Outpatient Medications  (Analgesics):    ibuprofen (ADVIL) 800 MG tablet, Take 1 tablet (800 mg total) by mouth every 8 (eight) hours as needed for cramping or moderate pain.   ibuprofen (ADVIL) 800 MG tablet, Take 1 tablet (800 mg total) by mouth every 8 (eight) hours as needed.   naproxen sodium (ALEVE) 220 MG tablet, Take 440 mg by mouth daily as needed.   SUMAtriptan (IMITREX) 100 MG tablet, Take 1 tablet (100 mg total) by mouth once as needed for migraine. May repeat in 2 hours if headache persists or recurs.   traMADol (ULTRAM-ER) 100 MG 24 hr tablet, Take 1 tablet (100 mg total) by mouth every 4 (four) hours as needed for pain.  Current Outpatient Medications (Hematological):    Ferrous Sulfate (IRON PO), Take 1 tablet by mouth daily at 12 noon.  Current Outpatient Medications (Other):  amphetamine-dextroamphetamine (ADDERALL XR) 10 MG 24 hr capsule, Take 10 mg by mouth daily at 12 noon.   amphetamine-dextroamphetamine (ADDERALL XR) 20 MG 24 hr capsule, Take 20 mg by mouth in the morning. At 600 am   diazepam (VALIUM) 5 MG tablet, Take 5 mg by mouth 2 (two) times daily as needed for anxiety.   diclofenac Sodium (VOLTAREN) 1 % GEL, Apply 1 application topically 3 (three) times daily as needed (arthritis pain (hands)).   Esketamine HCl, 84 MG Dose, (SPRAVATO, 84 MG DOSE,) 28 MG/DEVICE SOPK, Place into the nose as needed. Prn q 14 days   lamoTRIgine (LAMICTAL) 100 MG tablet, Take 1 tablet (100 mg total) by mouth 2 (two) times daily.   Menthol-Camphor (TIGER BALM ARTHRITIS RUB EX), Apply 1-2 application topically 3 (three) times daily as needed (pain.).   omeprazole (PRILOSEC) 20 MG capsule, as needed.   traZODone (DESYREL) 50 MG tablet, Take 1 tablet at Bedtime for Sleep (Patient taking differently: at bedtime as needed.)   Vitamin D, Ergocalciferol, (DRISDOL) 1.25 MG (50000 UNIT) CAPS capsule, Take  1 capsule  weekly  for Vitamin D Deficiency (Patient taking differently: Take  1 capsule  weekly  for Vitamin D  Deficiency FRIDAY)   Vortioxetine HBr (TRINTELLIX PO), Take by mouth at bedtime.  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   Depression    Diabetes mellitus without complication Type 2 diet controlled    Epilepsy ([place])    dx age 60   Habitual abortion history, antepartum    Headache(784.0)    History of diabetes mellitus type 2    prior to gastric bypass in 2012   history of migraine    none since dec 2022   History of recurrent miscarriages, not currently pregnant 10/11/2011   History of sleep apnea    no osa sonce 2013 weight loss after gastric bypass   Hypothyroidism    Infertility associated with anovulation    Obesity    BMi 33   Personal history of COVID-19 07/2020   fever, migraine, extreme lethargy x  1 week all symptoms resolved   Seasonal allergies 08/09/2021   Seizures ([place])    spring 2022 twitching of right hand only   UTI (lower urinary tract infection) 06/2021   after 05-2022 surgery   Allergies Allergies  Allergen Reactions   Injectafer [Ferric Carboxymaltose] Other (See Comments)    Pt had hypersensitivity rxn to [place]. See progress note from 12/15/21 at 1011. Pt able to complete infusion.    Venofer [Iron Sucrose] Anaphylaxis, Itching, Nausea Only, Other (See Comments) and Cough    Reaction to Venofer. See progress note 12/08/21. Unable to complete infusion. Extreme flushing; itching to body and throat, severe nausea.   Codeine Itching   Codeine Itching, Rash and Other (See Comments)    OPIOIDS - MORPHINE & RELATED CAN TAKE TRAMADOL CAN TAKE DEMEROL CAN TAKE   Immunization History  Administered Date(s) Administered   HPV 9-valent  04/07/2020   Influenza, Seasonal, Injecte, Preservative Fre 04/30/2016   Influenza,inj,Quad PF,6+ Mos 05/24/2017, 04/10/2018   Influenza,inj,quad, With Preservative 04/26/2015   PFIZER(Purple Top)SARS-COV-2 Vaccination 09/24/2019, 10/21/2019, 05/25/2020  Pneumococcal Polysaccharide-23 02/12/2019   Tdap 07/16/2017   Health Maintenance  Topic Date Due   FOOT EXAM  Never done   OPHTHALMOLOGY EXAM  Never done   HIV Screening  Never done   Diabetic kidney evaluation - Urine ACR  Never done   Hepatitis C Screening  Never done   HPV VACCINES (2 - Risk 3-dose SCDM series) 05/05/2020   COVID-19 Vaccine (4 - Pfizer risk series) 07/20/2020   COLONOSCOPY (Pts 45-106yr Insurance coverage will need to be confirmed)  Never done   INFLUENZA VACCINE  01/23/2022   HEMOGLOBIN A1C  04/27/2022   Diabetic kidney evaluation - GFR measurement  10/26/2022   PAP SMEAR-Modifier  03/22/2023   TETANUS/TDAP  07/17/2027     SURGICAL HISTORY She  has a past surgical history that includes Gastric bypass (06/25/2010); Dilation and curettage of uterus; Wisdom tooth extraction; Total laparoscopic hysterectomy with salpingectomy (N/A, 06/22/2021); laparoscopy (N/A, 08/14/2021); and Repair vaginal cuff (N/A, 08/14/2021). FAMILY HISTORY Her family history includes Alcohol abuse in her maternal uncle; Alzheimer's disease in her maternal grandfather, maternal grandmother, and paternal grandmother; Anorexia nervosa in her sister; Anxiety disorder in her maternal uncle; Depression in her maternal grandmother, maternal uncle, and mother; Diabetes in her maternal grandmother and mother. SOCIAL HISTORY She  reports that she has been smoking cigarettes. She has a 2.50 pack-year smoking history. She has never used smokeless tobacco. She reports current alcohol use. She reports that she does not use drugs.  Review of Systems: Review of Systems  Constitutional:  Negative for chills, diaphoresis, fever, malaise/fatigue and weight  loss.  HENT: Negative.    Respiratory: Negative.    Cardiovascular: Negative.   Gastrointestinal:  Negative for abdominal pain, blood in stool, constipation, diarrhea, heartburn, melena, nausea and vomiting.  Genitourinary: Negative.   Musculoskeletal: Negative.   Skin: Negative.   Neurological: Negative.  Negative for weakness.  Psychiatric/Behavioral:  Negative for depression, hallucinations, memory loss, substance abuse and suicidal ideas. The patient is not nervous/anxious and does not have insomnia.     Physical Exam: Estimated body mass index is 21.29 kg/m as calculated from the following:   Height as of 08/14/21: 5' 7.5" (1.715 m).   Weight as of 11/16/21: 138 lb (62.6 kg). LMP 08/23/2020  General Appearance: Well nourished, in no apparent distress.  Eyes: PERRLA, EOMs, conjunctiva no swelling or erythema, normal fundi and vessels.  Sinuses: No Frontal/maxillary tenderness  ENT/Mouth: Ext aud canals clear, normal light reflex with TMs without erythema, bulging. Good dentition. No erythema, swelling, or exudate on post pharynx. Tonsils not swollen or erythematous. Hearing normal. Crowded mouth.  Neck: Supple, thyroid normal. No bruits  Respiratory: Respiratory effort normal, BS equal bilaterally without rales, rhonchi, wheezing or stridor.  Cardio: RRR without murmurs, rubs or gallops. Prominent S2. Brisk peripheral pulses without edema.  Chest: symmetric, with normal excursions and percussion.  Abdomen: Soft, nontender, no guarding, rebound, hernias, masses, or organomegaly. .  Lymphatics: Non tender without lymphadenopathy.  Musculoskeletal: Full ROM all peripheral extremities,5/5 strength, and normal gait.  Skin: Warm, dry without rashes, lesions, ecchymosis. Neuro: Cranial nerves intact, reflexes equal bilaterally. Normal muscle tone, no cerebellar symptoms. Sensation intact.  Psych: Awake and oriented X 3, normal affect, Insight and Judgment appropriate.    Deneice Wack E   12:30 PM [facility]

## 2022-03-29 ENCOUNTER — Ambulatory Visit: Payer: Managed Care, Other (non HMO) | Admitting: Nurse Practitioner

## 2022-03-29 ENCOUNTER — Encounter: Payer: Self-pay | Admitting: Nurse Practitioner

## 2022-03-29 VITALS — BP 120/78 | HR 85 | Temp 97.9°F | Ht 67.0 in | Wt 128.0 lb

## 2022-03-29 DIAGNOSIS — F3341 Major depressive disorder, recurrent, in partial remission: Secondary | ICD-10-CM

## 2022-03-29 DIAGNOSIS — E1169 Type 2 diabetes mellitus with other specified complication: Secondary | ICD-10-CM

## 2022-03-29 DIAGNOSIS — Z1272 Encounter for screening for malignant neoplasm of vagina: Secondary | ICD-10-CM

## 2022-03-29 DIAGNOSIS — Z130 Encounter for screening for diseases of the blood and blood-forming organs and certain disorders involving the immune mechanism: Secondary | ICD-10-CM

## 2022-03-29 DIAGNOSIS — G40909 Epilepsy, unspecified, not intractable, without status epilepticus: Secondary | ICD-10-CM

## 2022-03-29 DIAGNOSIS — Z131 Encounter for screening for diabetes mellitus: Secondary | ICD-10-CM | POA: Diagnosis not present

## 2022-03-29 DIAGNOSIS — D509 Iron deficiency anemia, unspecified: Secondary | ICD-10-CM

## 2022-03-29 DIAGNOSIS — Z23 Encounter for immunization: Secondary | ICD-10-CM | POA: Diagnosis not present

## 2022-03-29 DIAGNOSIS — E559 Vitamin D deficiency, unspecified: Secondary | ICD-10-CM

## 2022-03-29 DIAGNOSIS — Z0000 Encounter for general adult medical examination without abnormal findings: Secondary | ICD-10-CM

## 2022-03-29 DIAGNOSIS — Z79899 Other long term (current) drug therapy: Secondary | ICD-10-CM

## 2022-03-29 DIAGNOSIS — Z136 Encounter for screening for cardiovascular disorders: Secondary | ICD-10-CM | POA: Diagnosis not present

## 2022-03-29 DIAGNOSIS — I10 Essential (primary) hypertension: Secondary | ICD-10-CM

## 2022-03-29 DIAGNOSIS — Z13220 Encounter for screening for lipoid disorders: Secondary | ICD-10-CM

## 2022-03-29 DIAGNOSIS — Z1389 Encounter for screening for other disorder: Secondary | ICD-10-CM

## 2022-03-29 DIAGNOSIS — J439 Emphysema, unspecified: Secondary | ICD-10-CM

## 2022-03-29 DIAGNOSIS — R1904 Left lower quadrant abdominal swelling, mass and lump: Secondary | ICD-10-CM

## 2022-03-29 DIAGNOSIS — N898 Other specified noninflammatory disorders of vagina: Secondary | ICD-10-CM

## 2022-03-29 DIAGNOSIS — Z0001 Encounter for general adult medical examination with abnormal findings: Secondary | ICD-10-CM

## 2022-03-29 DIAGNOSIS — F4310 Post-traumatic stress disorder, unspecified: Secondary | ICD-10-CM

## 2022-03-29 DIAGNOSIS — E039 Hypothyroidism, unspecified: Secondary | ICD-10-CM

## 2022-03-29 DIAGNOSIS — Z9884 Bariatric surgery status: Secondary | ICD-10-CM

## 2022-03-29 DIAGNOSIS — F17200 Nicotine dependence, unspecified, uncomplicated: Secondary | ICD-10-CM

## 2022-03-29 MED ORDER — PHENTERMINE HCL 37.5 MG PO TABS: ORAL_TABLET | ORAL | 0 refills | Status: DC

## 2022-03-29 MED ORDER — ESTROGENS CONJUGATED 0.625 MG/GM VA CREA: TOPICAL_CREAM | VAGINAL | 12 refills | Status: DC

## 2022-03-29 NOTE — Patient Instructions (Signed)
Strongly encouraged high caloric dense foods to increase weight gain.  Limit use of phentermine to only days when not using Aderall for increased energy  1/2 gram applicator to vagina daily x 14 days then maintenance dose will be 1/2 gram twice a week  Conjugated Estrogens Vaginal Cream What is this medication? CONJUGATED ESTROGENS (CON ju gate ed ESS troe jenz) relieves the symptoms of menopause, such as vaginal irritation, dryness, or pain during sex. It works by increasing levels of the hormone estrogen in the body. This medication is an estrogen hormone. This medicine may be used for other purposes; ask your health care provider or pharmacist if you have questions. COMMON BRAND NAME(S): Premarin What should I tell my care team before I take this medication? They need to know if you have any of these conditions: Abnormal vaginal bleeding Blood vessel disease or blood clots Breast, cervical, endometrial, or uterine cancer Dementia Diabetes Gallbladder disease Heart disease or recent heart attack High blood pressure High cholesterol High level of calcium in the blood Hysterectomy Kidney disease Liver disease Migraine headaches Protein C deficiency Protein S deficiency Stroke Systemic lupus erythematosus (SLE) Tobacco use An unusual or allergic reaction to estrogens other medications, foods, dyes, or preservatives Pregnant or trying to get pregnant Breast-feeding How should I use this medication? This medication is for use in the vagina only. Do not take by mouth. Follow the directions on the prescription label. Use at bedtime unless otherwise directed by your care team. Use the special applicator supplied with the cream. Wash hands before and after use. Fill the applicator with the cream and remove from the tube. Lie on your back, part and bend your knees. Insert the applicator into the vagina and push the plunger to expel the cream into the vagina. Wash the applicator with warm  soapy water and rinse well. Use exactly as directed for the complete length of time prescribed. Do not stop using except on the advice of your care team. Talk to your care team about the use of this medication in children. Special care may be needed. A patient package insert for the product will be given with each prescription and refill. Read this sheet carefully each time. The sheet may change frequently. Overdosage: If you think you have taken too much of this medicine contact a poison control center or emergency room at once. NOTE: This medicine is only for you. Do not share this medicine with others. What if I miss a dose? If you miss a dose, use it as soon as you can. If it is almost time for your next dose, use only that dose. Do not use double or extra doses. What may interact with this medication? Do not take this medication with any of the following: Aromatase inhibitors, such as aminoglutethimide, anastrozole, exemestane, letrozole, testolactone This medication may also interact with the following: Barbiturates used for inducing sleep or treating seizures Carbamazepine Certain antibiotics used to treat infections Grapefruit juice Medications for fungal infections, such as itraconazole and ketoconazole Raloxifene or tamoxifen Rifabutin Rifampin Rifapentine Ritonavir St. John's Wort Warfarin This list may not describe all possible interactions. Give your health care provider a list of all the medicines, herbs, non-prescription drugs, or dietary supplements you use. Also tell them if you smoke, drink alcohol, or use illegal drugs. Some items may interact with your medicine. What should I watch for while using this medication? Visit your care team for regular checks on your progress. You will need a regular breast and  pelvic exam. You should also discuss the need for regular mammograms with your care team, and follow their guidelines. This medication can make your body retain fluid,  making your fingers, hands, or ankles swell. Your blood pressure can go up. Contact your care team if you feel you are retaining fluid. If you have any reason to think you are pregnant; stop taking this medication at once and contact your care team. Tobacco smoking increases the risk of getting a blood clot or having a stroke, especially if you are more than 46 years old. You are strongly advised not to smoke. If you wear contact lenses and notice visual changes, or if the lenses begin to feel uncomfortable, consult your care team. If you are going to have elective surgery, you may need to stop taking this medication beforehand. Consult your care team for advice prior to scheduling the surgery. What side effects may I notice from receiving this medication? Side effects that you should report to your care team as soon as possible: Allergic reactions or angioedema--skin rash, itching or hives, swelling of the face, eyes, lips, tongue, arms, or legs, trouble swallowing or breathing Blood clot--pain, swelling, or warmth in the leg, shortness of breath, chest pain Breast tissue changes, new lumps, redness, pain, or discharge from the nipple Gallbladder problems--severe stomach pain, nausea, vomiting, fever Heart attack--pain or tightness in the chest, shoulders, arms, or jaw, nausea, shortness of breath, cold or clammy skin, feeling faint or lightheaded Heavy vaginal bleeding Increase in blood pressure Stroke--sudden numbness or weakness of the face, arm, or leg, trouble speaking, confusion, trouble walking, loss of balance or coordination, dizziness, severe headache, change in vision Sudden eye pain or change in vision such as blurry vision, seeing halos around lights, vision loss Unusual vaginal discharge, itching, or odor Side effects that usually do not require medical attention (report to your care team if they continue or are bothersome): Bloating Breast pain or tenderness Dark patches of skin  on the face or other sun-exposed areas Hair loss Irregular menstrual cycles or spotting Nausea Stomach pain Swelling of the ankles, hands, or feet This list may not describe all possible side effects. Call your doctor for medical advice about side effects. You may report side effects to FDA at 1-800-FDA-1088. Where should I keep my medication? Keep out of the reach of children and pets. Store at room temperature between 15 and 30 degrees C (59 and 86 degrees F). Throw away any unused medication after the expiration date. NOTE: This sheet is a summary. It may not cover all possible information. If you have questions about this medicine, talk to your doctor, pharmacist, or health care provider.  2023 Elsevier/Gold Standard (2021-05-01 00:00:00)

## 2022-03-30 ENCOUNTER — Other Ambulatory Visit: Payer: Self-pay | Admitting: Nurse Practitioner

## 2022-03-30 ENCOUNTER — Encounter: Payer: Self-pay | Admitting: Nurse Practitioner

## 2022-03-30 DIAGNOSIS — Z1272 Encounter for screening for malignant neoplasm of vagina: Secondary | ICD-10-CM

## 2022-03-30 NOTE — Telephone Encounter (Signed)
Can you see what else she wants with this courier thing.  I sent the pap through [facility]

## 2022-04-03 LAB — PAP, TP IMAGING, WNL RFLX HPV

## 2022-04-03 LAB — PAP, TP IMAGING W/ HPV RNA, RFLX HPV TYPE 16,18/45: HPV DNA High Risk: NOT DETECTED

## 2022-04-16 ENCOUNTER — Encounter: Payer: Self-pay | Admitting: Nurse Practitioner

## 2022-04-16 NOTE — Telephone Encounter (Signed)
Katrina-Can you check on this referral Pam- can you address this courier issue, I would advise her she needs to see if she can get that

## 2022-04-17 ENCOUNTER — Other Ambulatory Visit: Payer: Self-pay | Admitting: Nurse Practitioner

## 2022-04-17 ENCOUNTER — Encounter: Payer: Self-pay | Admitting: Nurse Practitioner

## 2022-04-17 DIAGNOSIS — F17200 Nicotine dependence, unspecified, uncomplicated: Secondary | ICD-10-CM

## 2022-04-17 MED ORDER — VARENICLINE TARTRATE (STARTER) 0.5 MG X 11 & 1 MG X 42 PO TBPK: ORAL_TABLET | ORAL | 1 refills | Status: DC

## 2022-04-23 ENCOUNTER — Other Ambulatory Visit: Payer: Self-pay | Admitting: Surgery

## 2022-04-23 DIAGNOSIS — R1030 Lower abdominal pain, unspecified: Secondary | ICD-10-CM

## 2022-05-04 ENCOUNTER — Ambulatory Visit
Admission: RE | Admit: 2022-05-04 | Discharge: 2022-05-04 | Disposition: A | Discharge status: Home / Self Care | Payer: Managed Care, Other (non HMO) | Source: Ambulatory Visit | Attending: Surgery

## 2022-05-04 DIAGNOSIS — R1030 Lower abdominal pain, unspecified: Secondary | ICD-10-CM

## 2022-05-04 MED ORDER — IOPAMIDOL (ISOVUE-300) INJECTION 61%
100.0000 mL | Freq: Once | INTRAVENOUS | Status: AC | PRN
  Administered 2022-05-04: 100 mL via INTRAVENOUS

## 2022-05-16 ENCOUNTER — Encounter: Payer: Self-pay | Admitting: Hematology

## 2022-05-23 ENCOUNTER — Telehealth: Payer: Self-pay | Admitting: Neurology

## 2022-05-23 ENCOUNTER — Ambulatory Visit: Payer: Managed Care, Other (non HMO) | Admitting: Neurology

## 2022-05-23 NOTE — Telephone Encounter (Signed)
Pt called. Stated she woke up with a fever and needed to cancel appointment.

## 2022-06-13 ENCOUNTER — Other Ambulatory Visit: Payer: Self-pay | Admitting: Neurology

## 2022-07-09 IMAGING — US US ABDOMEN COMPLETE
2 series · 13 of 25 positions shown · non-contrast
Comparison: None.

CLINICAL DATA: Elevated liver function tests.

EXAM:
ABDOMEN ULTRASOUND COMPLETE

[Series 1: us abdomen complete · 0.23mm/px · 12 of 103 slices shown (1 of 2)]
[im 1/103]
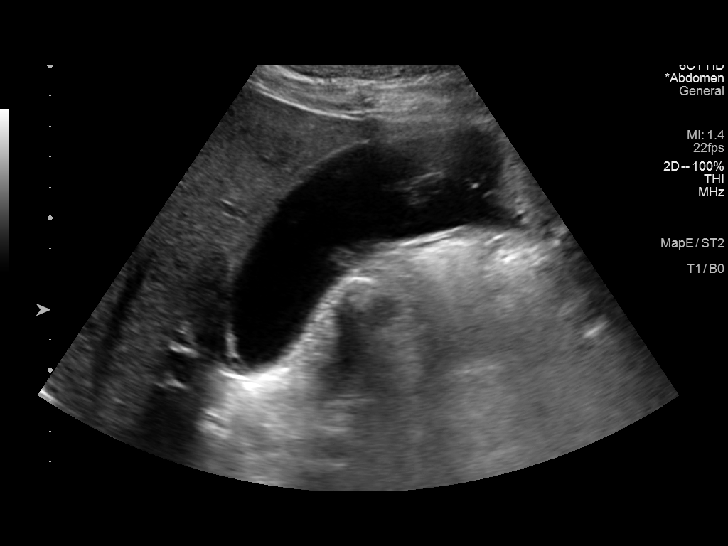
[im 9/103]
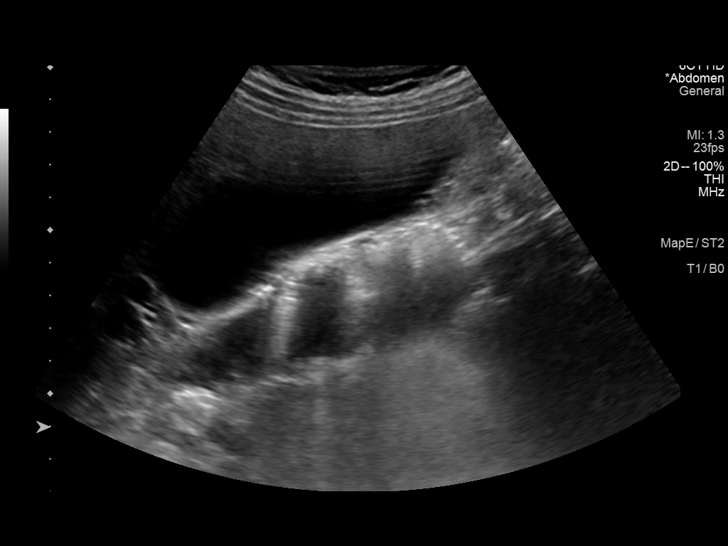
[im 18/103]
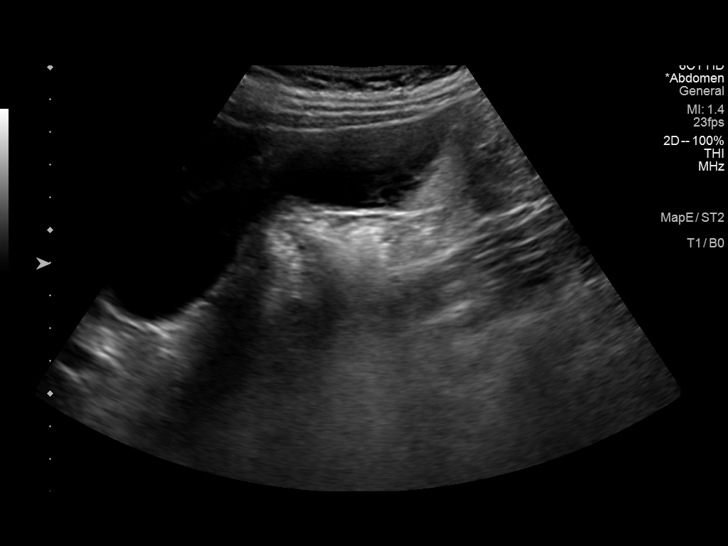
[im 27/103]
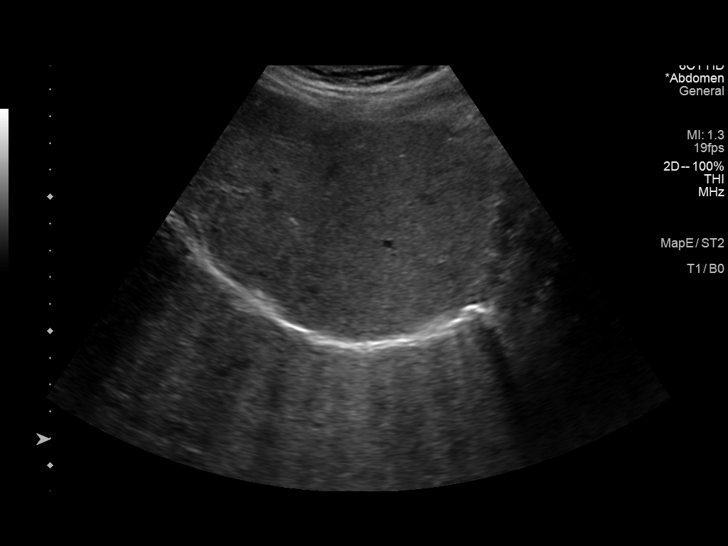
[im 36/103]
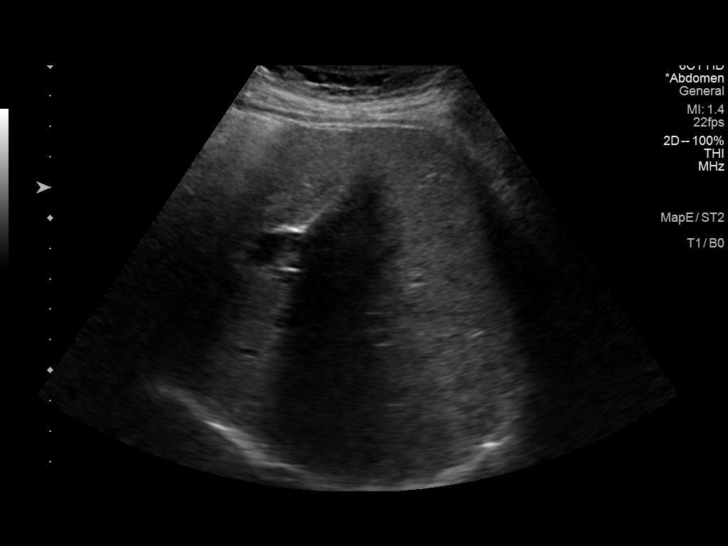
[im 45/103]
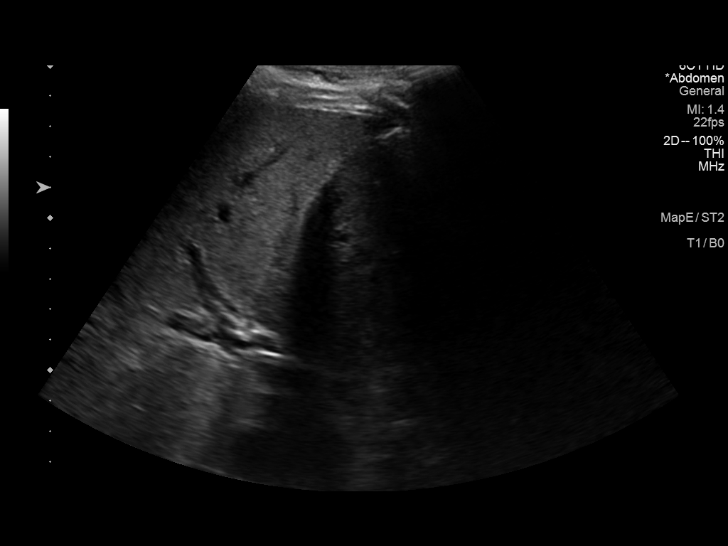
[im 54/103]
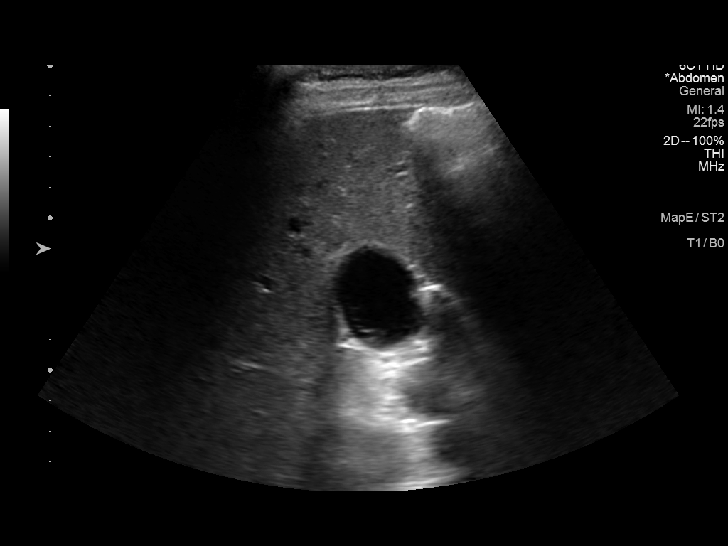
[im 63/103]
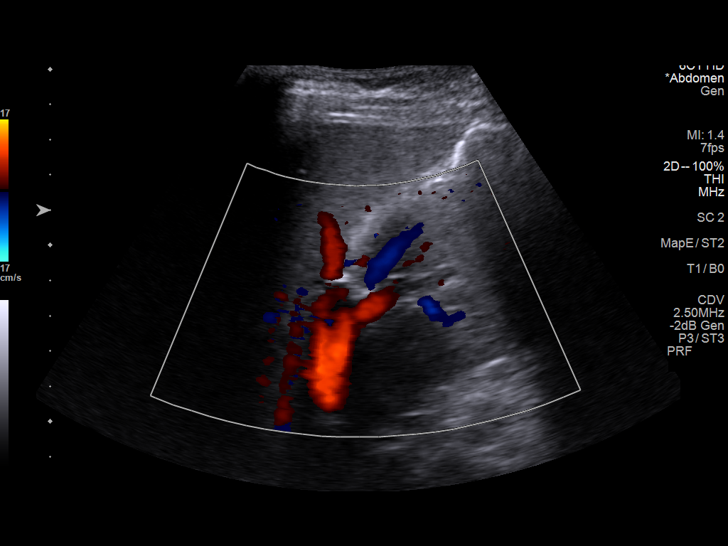
[im 71/103]
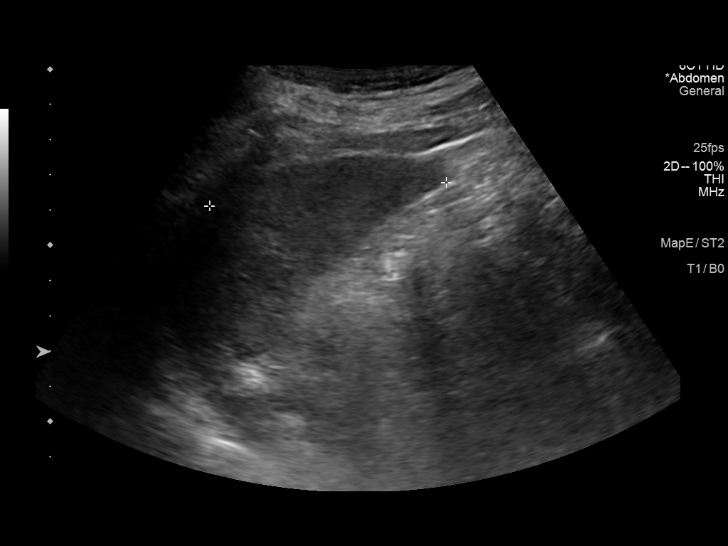
[im 80/103]
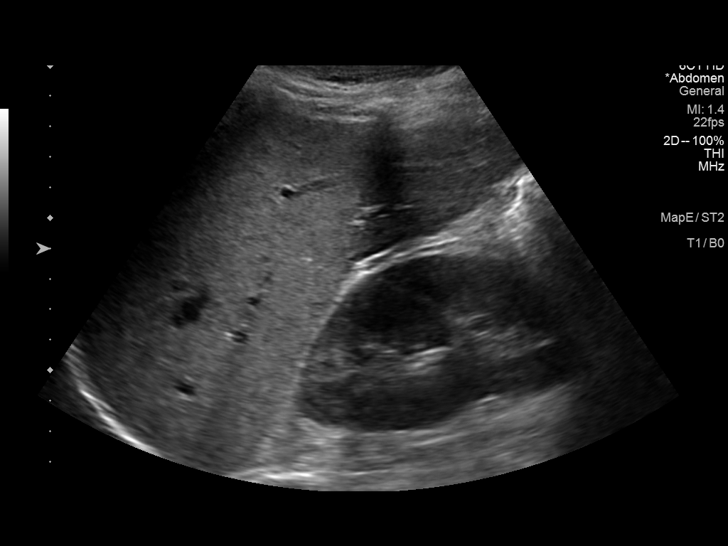
[im 89/103]
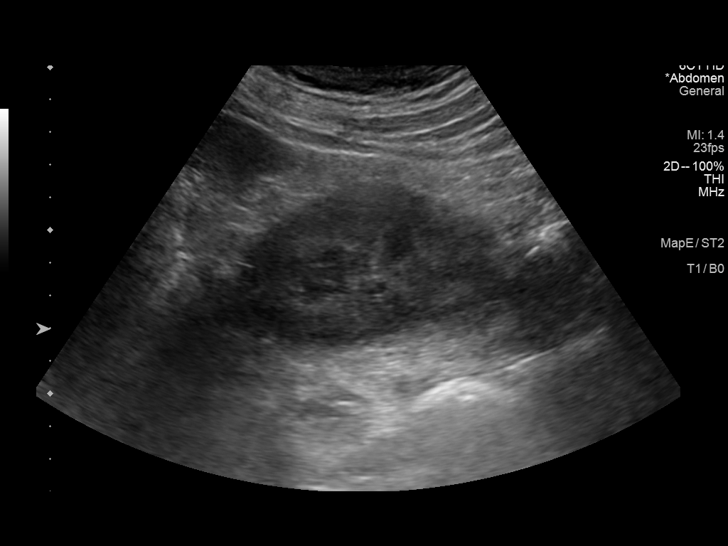
[im 98/103]
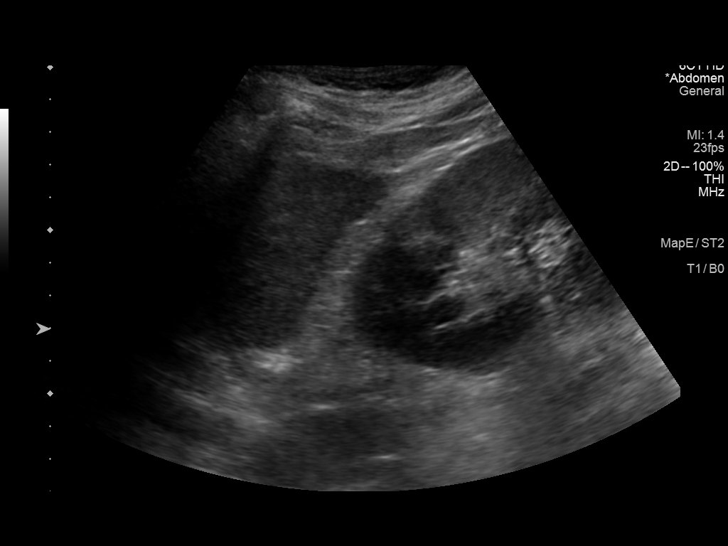

[Series 2001: us abdomen complete · 0.20mm/px · 1 of 1 slices shown (2 of 2)]
[im 1/1]
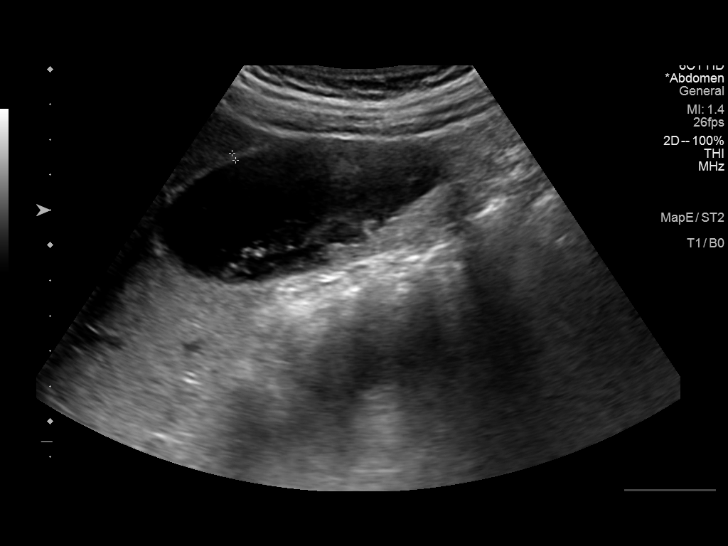

[13 of 25 positions shown; findings below may reference images not displayed]

FINDINGS: Gallbladder: A moderate amount of echogenic sludge is seen within
the gallbladder lumen. No gallstones or wall thickening visualized
(2.3 mm). No sonographic Murphy sign noted by sonographer.

Common bile duct: Diameter: 7.4 mm

Liver: It should be noted that evaluation of the left lobe of the
liver is limited secondary to overlying bowel gas. In area of
heterogeneous shadowing echogenic area is seen within the right lobe
of the liver. Within normal limits in parenchymal echogenicity.
Portal vein is patent on color Doppler imaging with normal direction
of blood flow towards the liver.

IVC: Not clearly visualized secondary to overlying bowel gas.

Pancreas: Not clearly visualized secondary to overlying bowel gas.

Spleen: Size and appearance within normal limits.

Right Kidney: Length: 11.3 cm. Echogenicity within normal limits. No
mass or hydronephrosis visualized.

Left Kidney: Length: 11.7 cm. Echogenicity within normal limits. No
mass or hydronephrosis visualized.

Abdominal aorta: Evaluation of the proximal portion of the abdominal
aorta is limited secondary to overlying bowel gas.

Other findings: None.
IMPRESSION: 1. Markedly limited study secondary to overlying bowel gas.
2. Moderate amount of gallbladder sludge without evidence of
cholelithiasis or acute cholecystitis.
3. Shadowing echogenic focus within the right lobe of the liver
which may represent an area of parenchymal calcification.
Correlation with follow-up nonemergent abdomen pelvis CT is
recommended.

## 2022-07-12 ENCOUNTER — Ambulatory Visit: Payer: Managed Care, Other (non HMO) | Admitting: Neurology

## 2022-07-12 ENCOUNTER — Telehealth: Payer: Self-pay | Admitting: Neurology

## 2022-07-12 NOTE — Telephone Encounter (Signed)
Pt cancelled appt due to exposed to Covid in the household. Transferred to Billing.

## 2022-09-05 ENCOUNTER — Encounter: Payer: Self-pay | Admitting: Physician Assistant

## 2022-09-05 MED ORDER — SCOPOLAMINE 1 MG/3DAYS TD PT72: 1.0000 | MEDICATED_PATCH | TRANSDERMAL | 0 refills | Status: AC

## 2022-09-10 ENCOUNTER — Other Ambulatory Visit: Payer: Self-pay | Admitting: Neurology

## 2022-10-01 NOTE — Progress Notes (Signed)
6 FOLLOW UP  Assessment and Plan:  Type 2 diabetes mellitus with hyperlipidemia (HCC) Currently controlled without medication Eye Exam yearly and Dental Exam every 6 months. Dietary recommendations Physical Activity recommendations Currently off Ozmepic - A1C - lipid panel - CMP/GFR - Urine routine with reflex microscopic - Microalbumin/creatinine urine ratio  Hypothyroidism, unspecified type -currently on levothyroxine 125 mcg QD - TSH Forward results to Dr Tenna Child- OB/GYN  Seizure disorder [facility]) Continue medications, neuro following  History of anemia due to gastric bypass - CBC - Iron Total and TIBC - Ferritin  History of right shoulder fracture Had surgery 04/2022 with Dr. Everardo Pacific Doing well with good ROM and no pain  Vaginal dryness Premarin vaginal cream as directed Advised to use twice a week as maintenance  Recurrent major depressive disorder, in partial remission (HCC) Continue to see psych for medications  Hx of morbid obesity/ s/p gastric sleeve - BMI 22 Continue diet/exercise No longer qualifies for Phentermine  Vitamin D deficiency Continue supplement  Med management - CBC, CMP/GFR, magnesium   Smoker Chantix continuing pack Strongly encouraged to cut down and quit- she is trying  LAB DONE AT [facility], HAS RX FOR LABS AND WILL GET DONE Future Appointments  Date Time Provider Department Center  04/04/2023 10:00 AM Raynelle Dick, NP [facility] None    HPI  47 y.o. female  presents for  follow up for chol, DM, seizures, anemia.  Works for [employer], gets labs there. Had labs ordered 03/29/22 and she never had the labs drawn.   She is having a lot of issues with her allergies. She is currently using Allegra/Claritin alternating and Flonase with some relief.  She was walking her girls to the car and she tri[[ed on pavement and landed on her shoulder and broke collarbone and had surgery with hardware placement by Dr. Everardo Pacific 04/2022.   She  had TAH- LSO laparoscopic 05/2021 .  She tried to stop smoking using chantix had cut down to 4 but went on cruise and is now smoking a ppd.  She does want to continue Chantix.   She has used Premarin vaginal cream with relief of vaginal dryness.  BMI is Body mass index is 22.24 kg/m., hx of morbid obesity, peak 260 lb, s/p gastric bypass in 2012, she has been working on diet and exercise, working out (lifting/body weight) and walking dogs 3-4 days Has new puppy, planning to start walking. Eats every 2 hours, high protein. Wt Readings from Last 3 Encounters:  10/02/22 142 lb (64.4 kg)  03/29/22 128 lb (58.1 kg)  11/16/21 138 lb (62.6 kg)   She had BP cuff but hasn't been checking, today their BP is BP: 118/80  BP Readings from Last 3 Encounters:  10/02/22 118/80  03/29/22 120/78  12/15/21 (!) 141/93  She does workout, walking with dogs.  She denies chest pain, shortness of breath, dizziness.     She is not on cholesterol medication and denies myalgias. Her cholesterol is at goal. The cholesterol last visit was:   Lab Results  Component Value Date   CHOL 242 (H) 10/25/2021   HDL 123 10/25/2021   LDLCALC 109 (H) 10/25/2021   TRIG 59 10/25/2021   CHOLHDL 1.8 03/23/2021    She has been working on diet and exercise for diabetes  with hyperlipidemia not on medications due to well controlled A1C Without CKD No CAD, CHF, or PVD denies paresthesia of the feet, polydipsia, polyuria and visual disturbances.  Last A1C in the  office was:  Lab Results  Component Value Date   HGBA1C 5.1 10/25/2021   Lab Results  Component Value Date   EGFR 105 10/25/2021    Patient is on Vitamin D supplement, 50000 IU once a week.  Lab Results  Component Value Date   VD25OH 48.6 01/19/2020     She is on thyroid medication. Her medication was not changed last visit. Taking levothyroxine 125 mcg daily, takes on empty stomach.  Lab Results  Component Value Date   TSH 12.500 (H) 10/25/2021   Due to  her gastric bypass she has has severe symptomatic iron def anemia, has PRN iron infusions.   Lab Results  Component Value Date   IRON 31 10/25/2021   TIBC 341 10/25/2021   FERRITIN 42 10/25/2021   She has had epilepsy since she was 14, currently on Lamictal 100 mg BID. Should remain on medication. Follows with Dr. Juline Patch. He checks levels.    Current Outpatient Medications on File Prior to Visit  Medication Sig   Acetaminophen (TYLENOL PO) Take by mouth.   amphetamine-dextroamphetamine (ADDERALL XR) 10 MG 24 hr capsule Take 10 mg by mouth daily at 12 noon.   amphetamine-dextroamphetamine (ADDERALL XR) 20 MG 24 hr capsule Take 20 mg by mouth in the morning. At 600 am   diazepam (VALIUM) 5 MG tablet Take 5 mg by mouth 2 (two) times daily as needed for anxiety.   diclofenac Sodium (VOLTAREN) 1 % GEL Apply 1 application topically 3 (three) times daily as needed (arthritis pain (hands)).   Esketamine HCl, 84 MG Dose, (SPRAVATO, 84 MG DOSE,) 28 MG/DEVICE SOPK Place into the nose as needed. Prn q 14 days   Ferrous Sulfate (IRON PO) Take 1 tablet by mouth daily at 12 noon.   fexofenadine (ALLEGRA) 180 MG tablet Take 180 mg by mouth daily as needed for allergies or rhinitis.   fluticasone (FLONASE) 50 MCG/ACT nasal spray PLACE 2 SPRAYS INTO BOTH NOSTRILS AT BEDTIME. (Patient taking differently: Place 2 sprays into both nostrils at bedtime as needed.)   ibuprofen (ADVIL) 800 MG tablet Take 1 tablet (800 mg total) by mouth every 8 (eight) hours as needed.   lamoTRIgine (LAMICTAL) 100 MG tablet TAKE 1 TABLET BY MOUTH TWICE  DAILY   levothyroxine (SYNTHROID) 125 MCG tablet TAKE 1 TABLET BY MOUTH DAILY   loratadine (CLARITIN) 10 MG tablet Take 10 mg by mouth daily as needed for allergies.   Menthol-Camphor (TIGER BALM ARTHRITIS RUB EX) Apply 1-2 application topically 3 (three) times daily as needed (pain.).   naproxen sodium (ALEVE) 220 MG tablet Take 440 mg by mouth daily as needed.   omeprazole  (PRILOSEC) 20 MG capsule as needed.   phentermine (ADIPEX-P) 37.5 MG tablet Take 1/2 to 1 tablet on days when not using adderall.  Monitor weight   traZODone (DESYREL) 50 MG tablet Take 1 tablet at Bedtime for Sleep (Patient taking differently: at bedtime as needed.)   Varenicline Tartrate, Starter, (CHANTIX STARTING MONTH PAK) 0.5 MG X 11 & 1 MG X 42 TBPK Take by mouth as directed   Vitamin D, Ergocalciferol, (DRISDOL) 1.25 MG (50000 UNIT) CAPS capsule Take  1 capsule  weekly  for Vitamin D Deficiency (Patient taking differently: Take  1 capsule  weekly  for Vitamin D Deficiency FRIDAY)   Vortioxetine HBr (TRINTELLIX PO) Take by mouth at bedtime.   conjugated estrogens (PREMARIN) vaginal cream 0.5 gram vaginally nightly x 2 weeks and then do 0.5 gram vaginally twice a  week (Patient not taking: Reported on 10/02/2022)   ibuprofen (ADVIL) 800 MG tablet Take 1 tablet (800 mg total) by mouth every 8 (eight) hours as needed for cramping or moderate pain. (Patient not taking: Reported on 10/02/2022)   SUMAtriptan (IMITREX) 100 MG tablet Take 1 tablet (100 mg total) by mouth once as needed for migraine. May repeat in 2 hours if headache persists or recurs.   No current facility-administered medications on file prior to visit.   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   Depression    Diabetes mellitus without complication Type 2 diet controlled    Epilepsy    dx age 36   Habitual abortion history, antepartum    Headache(784.0)    History of diabetes mellitus type 2    prior to gastric bypass in 2012   history of migraine    none since dec 2022   History of recurrent miscarriages,  not currently pregnant 10/11/2011   History of sleep apnea    no osa sonce 2013 weight loss after gastric bypass   Hypothyroidism    Infertility associated with anovulation    Obesity    BMi 33   Personal history of COVID-19 07/2020   fever, migraine, extreme lethargy x  1 week all symptoms resolved   Seasonal allergies 08/09/2021   Seizures    spring 2022 twitching of right hand only   UTI (lower urinary tract infection) 06/2021   after 05-2022 surgery   Allergies Allergies  Allergen Reactions   Injectafer [Ferric Carboxymaltose] Other (See Comments)    Pt had hypersensitivity rxn to Injectafer. See progress note from 12/15/21 at 1011. Pt able to complete infusion.    Venofer [Iron Sucrose] Anaphylaxis, Itching, Nausea Only, Other (See Comments) and Cough    Reaction to Venofer. See progress note 12/08/21. Unable to complete infusion. Extreme flushing; itching to body and throat, severe nausea.   Codeine Itching   Codeine Itching, Rash and Other (See Comments)    OPIOIDS - MORPHINE & RELATED CAN TAKE TRAMADOL CAN TAKE DEMEROL CAN TAKE    SURGICAL HISTORY She  has a past surgical history that includes Gastric bypass (06/25/2010); Dilation and curettage of uterus; Wisdom tooth extraction; Total laparoscopic hysterectomy with salpingectomy (N/A, 06/22/2021); laparoscopy (N/A, 08/14/2021); and Repair vaginal cuff (N/A, 08/14/2021). FAMILY HISTORY Her family history includes Alcohol abuse in her maternal uncle; Alzheimer's disease in her maternal grandfather, maternal grandmother, and paternal grandmother; Anorexia nervosa in her sister; Anxiety disorder in her maternal uncle; Depression in her maternal grandmother, maternal uncle, and mother; Diabetes in her maternal grandmother and mother. SOCIAL HISTORY She  reports that she has been smoking cigarettes. She has a 2.50 pack-year smoking history. She has never used smokeless tobacco. She reports current alcohol use. She reports that she  does not use drugs.   Review of Systems: Review of Systems  Constitutional:  Negative for chills, diaphoresis, fever, malaise/fatigue and weight loss.  HENT:  Negative for congestion and sinus pain.   Eyes:  Negative for blurred vision and double vision.  Respiratory: Negative.  Negative for cough, shortness of breath and wheezing.   Cardiovascular:  Negative for chest pain, palpitations and leg swelling.  Gastrointestinal:  Negative for abdominal pain, blood in stool, constipation, diarrhea, heartburn, melena, nausea and vomiting.  Genitourinary:  Negative for frequency.  Musculoskeletal: Negative.  Negative for back pain and joint pain.  Skin: Negative.   Neurological: Negative.  Negative for weakness.  Psychiatric/Behavioral:  Negative for depression, hallucinations, memory loss, substance abuse and suicidal ideas. The patient is not nervous/anxious and does not have insomnia.     Physical Exam: Estimated body mass index is 22.24 kg/m as calculated from the following:   Height as of this encounter: 5\' 7"  (1.702 m).   Weight as of this encounter: 142 lb (64.4 kg). BP 118/80   Pulse (!) 101   Temp (!) 97.3 F (36.3 C)   Ht 5\' 7"  (1.702 m)   Wt 142 lb (64.4 kg)   LMP 08/23/2020   SpO2 97%   BMI 22.24 kg/m  General Appearance: Thin female, in no apparent distress.  Eyes: PERRLA, EOMs, conjunctiva no swelling or erythema Sinuses: No tenderness ENT/Mouth: Ext aud canals clear, normal light reflex with TMs without erythema, bulging. Good dentition. No erythema, swelling, or exudate on post pharynx. Tonsils not swollen or erythematous. Hearing normal. Crowded mouth.  Neck: Supple, thyroid normal. No bruits  Respiratory: Respiratory effort normal, BS equal bilaterally without rales, rhonchi, wheezing or stridor.  Cardio: RRR without murmurs, rubs or gallops. Prominent S2. Brisk peripheral pulses without edema.  Chest: symmetric, with normal excursions and percussion.  Abdomen:  Soft, nontender, no guarding, rebound, hernias, masses, or organomegaly. .  Lymphatics: no adenopathy Musculoskeletal: Full ROM all peripheral extremities,5/5 strength, and normal gait.  Skin: Warm, dry without rashes, lesions, ecchymosis. Incision well healed on right shoulder Neuro: Cranial nerves intact, reflexes equal bilaterally. Normal muscle tone, no cerebellar symptoms. Sensation intact.  Psych: Awake and oriented X 3, normal affect, Insight and Judgment appropriate.    Zarina Pe E  10:57 AM [facility]

## 2022-10-02 ENCOUNTER — Encounter: Payer: Self-pay | Admitting: Nurse Practitioner

## 2022-10-02 ENCOUNTER — Ambulatory Visit: Payer: Managed Care, Other (non HMO) | Admitting: Nurse Practitioner

## 2022-10-02 VITALS — BP 118/80 | HR 101 | Temp 97.3°F | Ht 67.0 in | Wt 142.0 lb

## 2022-10-02 DIAGNOSIS — J302 Other seasonal allergic rhinitis: Secondary | ICD-10-CM

## 2022-10-02 DIAGNOSIS — N898 Other specified noninflammatory disorders of vagina: Secondary | ICD-10-CM

## 2022-10-02 DIAGNOSIS — D508 Other iron deficiency anemias: Secondary | ICD-10-CM

## 2022-10-02 DIAGNOSIS — F3341 Major depressive disorder, recurrent, in partial remission: Secondary | ICD-10-CM | POA: Diagnosis not present

## 2022-10-02 DIAGNOSIS — E785 Hyperlipidemia, unspecified: Secondary | ICD-10-CM

## 2022-10-02 DIAGNOSIS — E039 Hypothyroidism, unspecified: Secondary | ICD-10-CM

## 2022-10-02 DIAGNOSIS — G40909 Epilepsy, unspecified, not intractable, without status epilepticus: Secondary | ICD-10-CM

## 2022-10-02 DIAGNOSIS — F17200 Nicotine dependence, unspecified, uncomplicated: Secondary | ICD-10-CM

## 2022-10-02 DIAGNOSIS — Z8781 Personal history of (healed) traumatic fracture: Secondary | ICD-10-CM

## 2022-10-02 DIAGNOSIS — E1169 Type 2 diabetes mellitus with other specified complication: Secondary | ICD-10-CM

## 2022-10-02 DIAGNOSIS — Z9884 Bariatric surgery status: Secondary | ICD-10-CM

## 2022-10-02 DIAGNOSIS — E559 Vitamin D deficiency, unspecified: Secondary | ICD-10-CM

## 2022-10-02 DIAGNOSIS — Z79899 Other long term (current) drug therapy: Secondary | ICD-10-CM

## 2022-10-02 MED ORDER — VARENICLINE TARTRATE 1 MG PO TABS
1.0000 mg | ORAL_TABLET | Freq: Two times a day (BID) | ORAL | 2 refills | Status: DC
Start: 2022-10-02 — End: 2024-01-01

## 2022-10-02 MED ORDER — PREMARIN 0.625 MG/GM VA CREA
TOPICAL_CREAM | VAGINAL | 12 refills | Status: DC
Start: 2022-10-02 — End: 2024-01-01

## 2022-10-02 MED ORDER — FLUTICASONE PROPIONATE 50 MCG/ACT NA SUSP
2.0000 | Freq: Every day | NASAL | 3 refills | Status: DC
Start: 2022-10-02 — End: 2024-01-01

## 2022-10-02 NOTE — Patient Instructions (Signed)

## 2022-10-08 ENCOUNTER — Telehealth: Payer: Self-pay | Admitting: Nurse Practitioner

## 2022-10-08 NOTE — Telephone Encounter (Signed)
Can you call and verify the directions or tell them we sent the printed request

## 2022-10-08 NOTE — Telephone Encounter (Signed)
Spoke with Carollee Herter C. With [facility] and clarified prescription.

## 2022-10-08 NOTE — Telephone Encounter (Signed)
[facility] has questions on dispensing instructions for the Premarin that was sent in on 10/02/22. They would like a call back to clarify. The Ref. Number for the patient is [id number]

## 2022-10-29 ENCOUNTER — Telehealth: Payer: Self-pay

## 2022-10-29 NOTE — Telephone Encounter (Signed)
LMOM to remind patient she needs to get her lab work done and to make sure our office gets the results. I have sent Mychart messages with no response.

## 2022-11-01 ENCOUNTER — Other Ambulatory Visit: Payer: Self-pay | Admitting: Nurse Practitioner

## 2022-11-01 DIAGNOSIS — E039 Hypothyroidism, unspecified: Secondary | ICD-10-CM

## 2022-11-01 LAB — COMPREHENSIVE METABOLIC PANEL
ALT: 39 IU/L — ABNORMAL HIGH (ref 0–32)
AST: 58 IU/L — ABNORMAL HIGH (ref 0–40)
Albumin/Globulin Ratio: 2 (ref 1.2–2.2)
Albumin: 4.7 g/dL (ref 3.9–4.9)
Alkaline Phosphatase: 82 IU/L (ref 44–121)
BUN/Creatinine Ratio: 15 (ref 9–23)
BUN: 12 mg/dL (ref 6–24)
Bilirubin Total: 0.6 mg/dL (ref 0.0–1.2)
CO2: 21 mmol/L (ref 20–29)
Calcium: 9.4 mg/dL (ref 8.7–10.2)
Chloride: 100 mmol/L (ref 96–106)
Creatinine, Ser: 0.82 mg/dL (ref 0.57–1.00)
Globulin, Total: 2.4 g/dL (ref 1.5–4.5)
Glucose: 148 mg/dL — ABNORMAL HIGH (ref 70–99)
Potassium: 4.8 mmol/L (ref 3.5–5.2)
Sodium: 136 mmol/L (ref 134–144)
Total Protein: 7.1 g/dL (ref 6.0–8.5)
eGFR: 89 mL/min/{1.73_m2} (ref >59)

## 2022-11-01 LAB — TSH: TSH: 18.2 u[IU]/mL — ABNORMAL HIGH (ref 0.450–4.500)

## 2022-11-01 LAB — LIPID PANEL
Chol/HDL Ratio: 1.9 ratio (ref 0.0–4.4)
Cholesterol, Total: 237 mg/dL — ABNORMAL HIGH (ref 100–199)
HDL: 124 mg/dL (ref >39)
LDL Chol Calc (NIH): 97 mg/dL (ref 0–99)
Triglycerides: 98 mg/dL (ref 0–149)
VLDL Cholesterol Cal: 16 mg/dL (ref 5–40)

## 2022-11-01 LAB — URINALYSIS, ROUTINE W REFLEX MICROSCOPIC
Bilirubin, UA: NEGATIVE
Glucose, UA: NEGATIVE
Ketones, UA: NEGATIVE
Nitrite, UA: NEGATIVE
Protein,UA: NEGATIVE
RBC, UA: NEGATIVE
Specific Gravity, UA: 1.013 (ref 1.005–1.030)
Urobilinogen, Ur: 0.2 mg/dL (ref 0.2–1.0)
pH, UA: 6 (ref 5.0–7.5)

## 2022-11-01 LAB — MICROSCOPIC EXAMINATION
Bacteria, UA: NONE SEEN
Casts: NONE SEEN /lpf
RBC, Urine: NONE SEEN /hpf (ref 0–2)

## 2022-11-01 LAB — IRON,TIBC AND FERRITIN PANEL
Ferritin: 153 ng/mL — ABNORMAL HIGH (ref 15–150)
Iron Saturation: 40 % (ref 15–55)
Iron: 136 ug/dL (ref 27–159)
Total Iron Binding Capacity: 338 ug/dL (ref 250–450)
UIBC: 202 ug/dL (ref 131–425)

## 2022-11-01 LAB — MICROALBUMIN / CREATININE URINE RATIO
Creatinine, Urine: 60.1 mg/dL
Microalb/Creat Ratio: 7 mg/g creat (ref 0–29)
Microalbumin, Urine: 4.4 ug/mL

## 2022-11-01 LAB — VITAMIN D 25 HYDROXY (VIT D DEFICIENCY, FRACTURES): Vit D, 25-Hydroxy: 30.4 ng/mL (ref 30.0–100.0)

## 2022-11-01 LAB — MAGNESIUM: Magnesium: 2.4 mg/dL — ABNORMAL HIGH (ref 1.6–2.3)

## 2022-11-01 LAB — HEMOGLOBIN A1C
Est. average glucose Bld gHb Est-mCnc: 105 mg/dL
Hgb A1c MFr Bld: 5.3 % (ref 4.8–5.6)

## 2022-11-01 MED ORDER — LEVOTHYROXINE SODIUM 150 MCG PO CAPS
150.0000 ug | ORAL_CAPSULE | Freq: Every day | ORAL | 5 refills | Status: DC
Start: 2022-11-01 — End: 2022-11-05

## 2022-11-05 ENCOUNTER — Other Ambulatory Visit: Payer: Self-pay | Admitting: Internal Medicine

## 2022-11-05 DIAGNOSIS — E039 Hypothyroidism, unspecified: Secondary | ICD-10-CM

## 2022-11-05 MED ORDER — LEVOTHYROXINE SODIUM 150 MCG PO CAPS
ORAL_CAPSULE | ORAL | 3 refills | Status: AC
Start: 2022-11-05 — End: ?

## 2022-11-12 ENCOUNTER — Encounter: Payer: Self-pay | Admitting: Nurse Practitioner

## 2022-12-02 ENCOUNTER — Other Ambulatory Visit: Payer: Self-pay | Admitting: Neurology

## 2023-03-14 ENCOUNTER — Encounter: Payer: Managed Care, Other (non HMO) | Admitting: Nurse Practitioner

## 2023-04-04 ENCOUNTER — Encounter: Payer: Managed Care, Other (non HMO) | Admitting: Nurse Practitioner

## 2023-04-16 NOTE — Progress Notes (Deleted)
CPE Assessment and Plan: Encounter for general Adult Medical Examination with Abnormal Findings Due Yearly Will call [facility]   Type 2 diabetes mellitus with hyperlipidemia (HCC) Off meds with weight loss, will restart ozemipic if needed Currently on no cholesterol medications  Hypothyroidism, unspecified type Hypothyroidism-check TSH level, continue medications the same, reminded to take on an empty stomach 30-36mins before food.  With weight loss may need to cut back on medication  Seizure disorder (HCC) Off medications Will check valproic acid level Will refer to neuro for follow up  Recurrent major depressive disorder, in partial remission (HCC)/PTSD Continue to see psych Continue medications  Medication management - Magnesium  Iron deficiency anemia - CBC - IRON/TIBC - Ferritin Continue iron supplementation  Obesity surgery status, S/P gastric bypass At goal weight, continue to increase exercise - Vitamin B12  Vitamin D deficiency Continue supplement  COPD/Pulmonary Emphysema(HCC)/SMOKER Via CXR Continue medications and monitor symptoms  Screening for hematuria/proteinuria - Routine UA with reflex microscopic - Microalbumin/creatinine urine ratio  Screening for ischemic heart disease - EKG  ASCUS PAP - Thin Prep pap taken and submitted  Flu Vaccine need - Flu vaccine Quad 6 + months PF IM given  LLQ abdominal mass Noted after laprascopic TVH Will refer to general surgery for evaluation  Discussed med's effects and SE's. Screening labs and tests as requested with regular follow-up as recommended. LAB DONE AT [facility], HAS RX FOR LABS AND WILL GET DONE Future Appointments  Date Time Provider Department Center  04/18/2023  2:00 PM Raynelle Dick, NP [facility] None  04/20/2024  2:00 PM Raynelle Dick, NP [facility] None   zz HPI  47 y.o. female  presents for  follow up for chol, DM, seizures, anemia.    Her  blood pressure has been controlled at home, today their BP is    BP Readings from Last 3 Encounters:  10/02/22 118/80  03/29/22 120/78  12/15/21 (!) 141/93  She does workout she is walking more with her dog Lilly She denies chest pain, shortness of breath, dizziness.  She had a negative rheumatoid work up  [employer] to have issues with scar tissue from [facility] and removal of 1 ovary. Was allergic to sutures that were used.  Had bac reaction to venifer Iron transfusion- panic attack, shortness of breath, all over body heat, inability to focus. She has had 2 infusions and is now on Ferrous Sulfate   She has had epilepsy since she was 14, followed with Dr. Ricki Miller in the past, on valproic acid 250mg  takes 750mg  BID has been off approximately 1 year and is currently on Lamictal and doing well- seizure free. She is continuing to have some alcohol intake but no increase in seizure activity, advised of risk   She is still smoking. She is currently smoking 1/2-1 ppd depending on stress.  CXR showed Mild hyperinflation without acute finding.  She is 6 years s/p gastric bypass, she was 260 before the surgery and got down to 167.  She is now at 128 continues to eat every 2 hours, trying to increase protein and eating more carbs. She will occasionally have emesis and is having difficulty eating only small meals.   BMI is There is no height or weight on file to calculate BMI., she is working on diet and exercise. Wt Readings from Last 3 Encounters:  10/02/22 142 lb (64.4 kg)  03/29/22 128 lb (58.1 kg)  11/16/21 138 lb (62.6 kg)  She is not on cholesterol medication and denies myalgias. Her cholesterol is at goal. The cholesterol last visit was:   Lab Results  Component Value Date   CHOL 237 (H) 10/31/2022   HDL 124 10/31/2022   LDLCALC 97 10/31/2022   TRIG 98 10/31/2022   CHOLHDL 1.9 10/31/2022    She has been working on diet and exercise for diabetes  with hyperlipidemia not on medications Without  CKD No CAD, CHF, or PVD denies paresthesia of the feet, polydipsia, polyuria and visual disturbances.  Last A1C in the office was:  Lab Results  Component Value Date   HGBA1C 5.3 10/31/2022     Lab Results  Component Value Date   EGFR 89 10/31/2022   She is having vaginal dryness and is a relationship x 7 months and needs to use lubrication with every act of intercourse.   Patient is on Vitamin D supplement.   Lab Results  Component Value Date   VD25OH 30.4 10/31/2022     She is on thyroid medication.She is currently on Levothyroxine 125 mcg daily. Continues to have fatigue and hair loss.  Her medication was not changed last visit.   Lab Results  Component Value Date   TSH 18.200 (H) 10/31/2022  .   She is on trazodone for sleep, takes PRN, uses once a month.  She is on the valium PRN as well for anxiety.   She has a history of miscarriages and infertility HOWEVER has seen her GYN.   Due to her gastric bypass she has has severe symptomatic iron def anemia, she has had an iron transfusion in the past and remains on iron at this time and is feeling better.  Lab Results  Component Value Date   IRON 136 10/31/2022   TIBC 338 10/31/2022   FERRITIN 153 (H) 10/31/2022     Current Outpatient Medications (Endocrine & Metabolic):    Levothyroxine Sodium 150 MCG CAPS, Take  1 tablet  Daily  on an empty stomach with only water for 30 minutes & no Antacid meds, Calcium or Magnesium for 4 hours & avoid Biotin                                                                                                                                  /                                                                                 TAKE  BY                                                        MOUTH   Current Outpatient Medications (Respiratory):    fexofenadine (ALLEGRA) 180 MG tablet, Take 180 mg by mouth daily as needed for allergies or  rhinitis.   fluticasone (FLONASE) 50 MCG/ACT nasal spray, Place 2 sprays into both nostrils at bedtime.   loratadine (CLARITIN) 10 MG tablet, Take 10 mg by mouth daily as needed for allergies.  Current Outpatient Medications (Analgesics):    Acetaminophen (TYLENOL PO), Take by mouth.   ibuprofen (ADVIL) 800 MG tablet, Take 1 tablet (800 mg total) by mouth every 8 (eight) hours as needed.   naproxen sodium (ALEVE) 220 MG tablet, Take 440 mg by mouth daily as needed.   SUMAtriptan (IMITREX) 100 MG tablet, Take 1 tablet (100 mg total) by mouth once as needed for migraine. May repeat in 2 hours if headache persists or recurs.  Current Outpatient Medications (Hematological):    Ferrous Sulfate (IRON PO), Take 1 tablet by mouth daily at 12 noon.  Current Outpatient Medications (Other):    amphetamine-dextroamphetamine (ADDERALL XR) 10 MG 24 hr capsule, Take 10 mg by mouth daily at 12 noon.   amphetamine-dextroamphetamine (ADDERALL XR) 20 MG 24 hr capsule, Take 20 mg by mouth in the morning. At 600 am   conjugated estrogens (PREMARIN) vaginal cream, 1 applicator vaginally every night x 2 weeks and then twice a week maintenance   diazepam (VALIUM) 5 MG tablet, Take 5 mg by mouth 2 (two) times daily as needed for anxiety.   diclofenac Sodium (VOLTAREN) 1 % GEL, Apply 1 application topically 3 (three) times daily as needed (arthritis pain (hands)).   Esketamine HCl, 84 MG Dose, (SPRAVATO, 84 MG DOSE,) 28 MG/DEVICE SOPK, Place into the nose as needed. Prn q 14 days   lamoTRIgine (LAMICTAL) 100 MG tablet, TAKE 1 TABLET BY MOUTH TWICE  DAILY   Menthol-Camphor (TIGER BALM ARTHRITIS RUB EX), Apply 1-2 application topically 3 (three) times daily as needed (pain.).   omeprazole (PRILOSEC) 20 MG capsule, as needed.   traZODone (DESYREL) 50 MG tablet, Take 1 tablet at Bedtime for Sleep (Patient taking differently: at bedtime as needed.)   varenicline (CHANTIX CONTINUING MONTH PAK) 1 MG tablet, Take 1 tablet (1  mg total) by mouth 2 (two) times daily.   Vitamin D, Ergocalciferol, (DRISDOL) 1.25 MG (50000 UNIT) CAPS capsule, Take  1 capsule  weekly  for Vitamin D Deficiency (Patient taking differently: Take  1 capsule  weekly  for Vitamin D Deficiency FRIDAY)   Vortioxetine HBr (TRINTELLIX PO), Take by mouth at bedtime.  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   Depression    Diabetes mellitus without complication Type 2 diet controlled    Epilepsy (HCC)    dx age 54   Habitual abortion history, antepartum    Headache(784.0)    History of diabetes mellitus type 2  prior to gastric bypass in 2012   history of migraine    none since dec 2022   History of recurrent miscarriages, not currently pregnant 10/11/2011   History of sleep apnea    no osa sonce 2013 weight loss after gastric bypass   Hypothyroidism    Infertility associated with anovulation    Obesity    BMi 33   Personal history of COVID-19 07/2020   fever, migraine, extreme lethargy x  1 week all symptoms resolved   Seasonal allergies 08/09/2021   Seizures (HCC)    spring 2022 twitching of right hand only   UTI (lower urinary tract infection) 06/2021   after 05-2022 surgery   Allergies Allergies  Allergen Reactions   Injectafer [Ferric Carboxymaltose] Other (See Comments)    Pt had hypersensitivity rxn to Injectafer. See progress note from 12/15/21 at 1011. Pt able to complete infusion.    Venofer [Iron Sucrose] Anaphylaxis, Itching, Nausea Only, Other (See Comments) and Cough    Reaction to Venofer. See progress note 12/08/21. Unable to complete infusion. Extreme flushing; itching to body and throat, severe  nausea.   Codeine Itching   Codeine Itching, Rash and Other (See Comments)    OPIOIDS - MORPHINE & RELATED CAN TAKE TRAMADOL CAN TAKE DEMEROL CAN TAKE   Immunization History  Administered Date(s) Administered   HPV 9-valent 04/07/2020   Influenza, Seasonal, Injecte, Preservative Fre 04/30/2016   Influenza,inj,Quad PF,6+ Mos 05/24/2017, 04/10/2018, 03/29/2022   Influenza,inj,quad, With Preservative 04/26/2015   PFIZER(Purple Top)SARS-COV-2 Vaccination 09/24/2019, 10/21/2019, 05/25/2020   Pneumococcal Polysaccharide-23 02/12/2019   Tdap 07/16/2017   Health Maintenance  Topic Date Due   FOOT EXAM  Never done   OPHTHALMOLOGY EXAM  Never done   HIV Screening  Never done   Hepatitis C Screening  Never done   HPV VACCINES (2 - 3-dose SCDM series) 05/05/2020   Colonoscopy  Never done   INFLUENZA VACCINE  01/24/2023   COVID-19 Vaccine (4 - 2023-24 season) 02/24/2023   HEMOGLOBIN A1C  05/03/2023   Diabetic kidney evaluation - eGFR measurement  10/31/2023   Diabetic kidney evaluation - Urine ACR  10/31/2023   DTaP/Tdap/Td (2 - Td or Tdap) 07/17/2027     SURGICAL HISTORY She  has a past surgical history that includes Gastric bypass (06/25/2010); Dilation and curettage of uterus; Wisdom tooth extraction; Total laparoscopic hysterectomy with salpingectomy (N/A, 06/22/2021); laparoscopy (N/A, 08/14/2021); and Repair vaginal cuff (N/A, 08/14/2021). FAMILY HISTORY Her family history includes Alcohol abuse in her maternal uncle; Alzheimer's disease in her maternal grandfather, maternal grandmother, and paternal grandmother; Anorexia nervosa in her sister; Anxiety disorder in her maternal uncle; Depression in her maternal grandmother, maternal uncle, and mother; Diabetes in her maternal grandmother and mother. SOCIAL HISTORY She  reports that she has been smoking cigarettes. She has a 2.5 pack-year smoking history. She has never used smokeless tobacco. She reports current alcohol use. She reports  that she does not use drugs.  Review of Systems: Review of Systems  Constitutional:  Positive for weight loss. Negative for chills, diaphoresis, fever and malaise/fatigue.  HENT: Negative.    Respiratory: Negative.    Cardiovascular: Negative.   Gastrointestinal:  Positive for abdominal pain (LLQ with mass). Negative for blood in stool, constipation, diarrhea, heartburn, melena, nausea and vomiting.  Genitourinary: Negative.        Vaginal dryness  Musculoskeletal: Negative.   Skin: Negative.   Neurological: Negative.  Negative for weakness.  Psychiatric/Behavioral:  Negative for depression, hallucinations, memory loss, substance  abuse and suicidal ideas. The patient is not nervous/anxious and does not have insomnia.     Physical Exam: Estimated body mass index is 22.24 kg/m as calculated from the following:   Height as of 10/02/22: 5\' 7"  (1.702 m).   Weight as of 10/02/22: 142 lb (64.4 kg). LMP 08/23/2020  General Appearance: Well nourished, in no apparent distress.  Eyes: PERRLA, EOMs, conjunctiva no swelling or erythema, normal fundi and vessels.  Sinuses: No Frontal/maxillary tenderness  ENT/Mouth: Ext aud canals clear, normal light reflex with TMs without erythema, bulging. Good dentition. No erythema, swelling, or exudate on post pharynx. Tonsils not swollen or erythematous. Hearing normal. Crowded mouth.  Neck: Supple, thyroid normal. No bruits  Respiratory: Respiratory effort normal, BS equal bilaterally without rales, rhonchi, wheezing or stridor.  Cardio: RRR without murmurs, rubs or gallops. Prominent S2. Brisk peripheral pulses without edema.  Chest: symmetric, with normal excursions and percussion.  Breasts: breasts appear normal, no suspicious masses, no skin or nipple changes or axillary nodes. Abdomen: Soft, nontender,Protruding mass LLQ worse with cough Lymphatics: Non tender without lymphadenopathy.  Musculoskeletal: Full ROM all peripheral extremities,5/5 strength,  and normal gait.  Skin: Warm, dry without rashes, lesions, ecchymosis. Neuro: Cranial nerves intact, reflexes equal bilaterally. Normal muscle tone, no cerebellar symptoms. Sensation intact.  Psych: Awake and oriented X 3, normal affect, Insight and Judgment appropriate.  Pelvic exam: VULVA: normal appearing vulva with no masses, tenderness or lesions, VAGINA: normal appearing vagina with normal color and discharge, no lesions, PAP: Pap smear done today, thin-prep method.  EKG: NSR, no ST changes   Deakon Frix E  1:51 PM [facility]

## 2023-04-18 ENCOUNTER — Encounter: Payer: Managed Care, Other (non HMO) | Admitting: Nurse Practitioner

## 2023-04-18 DIAGNOSIS — F3341 Major depressive disorder, recurrent, in partial remission: Secondary | ICD-10-CM

## 2023-04-18 DIAGNOSIS — Z136 Encounter for screening for cardiovascular disorders: Secondary | ICD-10-CM

## 2023-04-18 DIAGNOSIS — Z1329 Encounter for screening for other suspected endocrine disorder: Secondary | ICD-10-CM

## 2023-04-18 DIAGNOSIS — Z1389 Encounter for screening for other disorder: Secondary | ICD-10-CM

## 2023-04-18 DIAGNOSIS — E559 Vitamin D deficiency, unspecified: Secondary | ICD-10-CM

## 2023-04-18 DIAGNOSIS — E785 Hyperlipidemia, unspecified: Secondary | ICD-10-CM

## 2023-04-18 DIAGNOSIS — F17200 Nicotine dependence, unspecified, uncomplicated: Secondary | ICD-10-CM

## 2023-04-18 DIAGNOSIS — Z9884 Bariatric surgery status: Secondary | ICD-10-CM

## 2023-04-18 DIAGNOSIS — G40909 Epilepsy, unspecified, not intractable, without status epilepticus: Secondary | ICD-10-CM

## 2023-04-18 DIAGNOSIS — D508 Other iron deficiency anemias: Secondary | ICD-10-CM

## 2023-04-18 DIAGNOSIS — E1169 Type 2 diabetes mellitus with other specified complication: Secondary | ICD-10-CM

## 2023-04-18 DIAGNOSIS — R87610 Atypical squamous cells of undetermined significance on cytologic smear of cervix (ASC-US): Secondary | ICD-10-CM

## 2023-04-18 DIAGNOSIS — J439 Emphysema, unspecified: Secondary | ICD-10-CM

## 2023-04-18 DIAGNOSIS — Z79899 Other long term (current) drug therapy: Secondary | ICD-10-CM

## 2023-04-18 DIAGNOSIS — Z0001 Encounter for general adult medical examination with abnormal findings: Secondary | ICD-10-CM

## 2023-04-18 DIAGNOSIS — E039 Hypothyroidism, unspecified: Secondary | ICD-10-CM

## 2023-12-30 ENCOUNTER — Telehealth (HOSPITAL_COMMUNITY): Payer: Self-pay | Admitting: Licensed Clinical Social Worker

## 2023-12-30 NOTE — Telephone Encounter (Signed)
 The therapist returns call confirming her identity via two identifiers. She says that she is struggling with alcoholism. She says that her parents want her to stop cold [place] having staged an intervention of sorts this morning. Porschea admits that she questions this recommendation as she has bene drinking so much for so long. She says that two of her dogs got poisoned last October and she has been essentially drinking daily since then consuming approximately 14 standard drinks in a day or day-and-a-half. She says that she has experienced withdrawal tremors and has been diagnosed with Epilepsy.  Dr. Trinidad Aurora is her psychiatrist and Dr. Randine Collet is her therpaist. She says that Dr. Aurora has prescribed her Diazepam  5 mg which she takes approximately twice a week but says Dr. Aurora wrote her a few days of Xanax recently as she was going on a trip.  The therapist discusses the dangers of abruptly reducing or stopping her alcohol consumption and makes her aware of inpatient detox at the [facility] which she will talk over with her family. She will call this therapist back on a p.r.n. basis.   Zell Maier, MA, LCSW, [facility], LCAS 12/30/2023

## 2023-12-31 ENCOUNTER — Other Ambulatory Visit (HOSPITAL_COMMUNITY)
Admission: EM | Admit: 2023-12-31 | Discharge: 2024-01-04 | Disposition: A | Discharge status: Home / Self Care | Source: Intra-hospital | Attending: Psychiatry | Admitting: Psychiatry

## 2023-12-31 ENCOUNTER — Encounter (HOSPITAL_COMMUNITY): Payer: Self-pay | Admitting: Psychiatry

## 2023-12-31 ENCOUNTER — Ambulatory Visit (HOSPITAL_COMMUNITY)
Admission: EM | Admit: 2023-12-31 | Discharge: 2023-12-31 | Disposition: A | Discharge status: Home / Self Care | Attending: Psychiatry | Admitting: Psychiatry

## 2023-12-31 ENCOUNTER — Other Ambulatory Visit (HOSPITAL_COMMUNITY)
Admission: EM | Admit: 2023-12-31 | Discharge: 2023-12-31 | Disposition: A | Discharge status: Other Acute Inpatient Hospital | Attending: Psychiatry | Admitting: Psychiatry

## 2023-12-31 ENCOUNTER — Encounter: Payer: Self-pay | Admitting: Hematology

## 2023-12-31 DIAGNOSIS — F339 Major depressive disorder, recurrent, unspecified: Secondary | ICD-10-CM

## 2023-12-31 DIAGNOSIS — F411 Generalized anxiety disorder: Secondary | ICD-10-CM

## 2023-12-31 DIAGNOSIS — F1020 Alcohol dependence, uncomplicated: Secondary | ICD-10-CM

## 2023-12-31 DIAGNOSIS — G40909 Epilepsy, unspecified, not intractable, without status epilepticus: Secondary | ICD-10-CM | POA: Insufficient documentation

## 2023-12-31 DIAGNOSIS — Z79899 Other long term (current) drug therapy: Secondary | ICD-10-CM | POA: Insufficient documentation

## 2023-12-31 DIAGNOSIS — G40A01 Absence epileptic syndrome, not intractable, with status epilepticus: Secondary | ICD-10-CM | POA: Diagnosis not present

## 2023-12-31 DIAGNOSIS — F332 Major depressive disorder, recurrent severe without psychotic features: Secondary | ICD-10-CM | POA: Insufficient documentation

## 2023-12-31 DIAGNOSIS — F10230 Alcohol dependence with withdrawal, uncomplicated: Secondary | ICD-10-CM

## 2023-12-31 DIAGNOSIS — Z91148 Patient's other noncompliance with medication regimen for other reason: Secondary | ICD-10-CM | POA: Insufficient documentation

## 2023-12-31 LAB — POCT URINE DRUG SCREEN - MANUAL ENTRY (I-SCREEN)
POC Amphetamine UR: NOT DETECTED
POC Buprenorphine (BUP): NOT DETECTED
POC Cocaine UR: NOT DETECTED
POC Marijuana UR: NOT DETECTED
POC Methadone UR: NOT DETECTED
POC Methamphetamine UR: NOT DETECTED
POC Morphine: NOT DETECTED
POC Oxazepam (BZO): POSITIVE — AB
POC Oxycodone UR: NOT DETECTED
POC Secobarbital (BAR): NOT DETECTED

## 2023-12-31 LAB — POC URINE PREG, ED: Preg Test, Ur: NEGATIVE

## 2023-12-31 MED ORDER — DIPHENHYDRAMINE HCL 50 MG/ML IJ SOLN: 50.0000 mg | Freq: Three times a day (TID) | INTRAMUSCULAR | Status: DC | PRN

## 2023-12-31 MED ORDER — THIAMINE HCL 100 MG/ML IJ SOLN: 100.0000 mg | Freq: Once | INTRAMUSCULAR | Status: DC

## 2023-12-31 MED ORDER — DIPHENHYDRAMINE HCL 50 MG PO CAPS: 50.0000 mg | ORAL_CAPSULE | Freq: Three times a day (TID) | ORAL | Status: DC | PRN

## 2023-12-31 MED ORDER — LEVOTHYROXINE SODIUM 75 MCG PO TABS
150.0000 ug | ORAL_TABLET | Freq: Every day | ORAL | Status: DC
  Administered 2024-01-01 – 2024-01-04 (×4): 150 ug via ORAL
  Filled 2023-12-31 (×4): qty 2

## 2023-12-31 MED ORDER — VENLAFAXINE HCL ER 150 MG PO CP24
150.0000 mg | ORAL_CAPSULE | Freq: Every day | ORAL | Status: DC
  Administered 2024-01-01 – 2024-01-04 (×4): 150 mg via ORAL
  Filled 2023-12-31 (×4): qty 1

## 2023-12-31 MED ORDER — LORAZEPAM 1 MG PO TABS
1.0000 mg | ORAL_TABLET | Freq: Every day | ORAL | Status: AC
  Administered 2024-01-04: 1 mg via ORAL
  Filled 2023-12-31: qty 1

## 2023-12-31 MED ORDER — HALOPERIDOL 5 MG PO TABS: 5.0000 mg | ORAL_TABLET | Freq: Three times a day (TID) | ORAL | Status: DC | PRN

## 2023-12-31 MED ORDER — ONDANSETRON 4 MG PO TBDP
4.0000 mg | ORAL_TABLET | Freq: Four times a day (QID) | ORAL | Status: AC | PRN
  Administered 2024-01-01 – 2024-01-02 (×2): 4 mg via ORAL
  Filled 2023-12-31: qty 1

## 2023-12-31 MED ORDER — LORAZEPAM 2 MG/ML IJ SOLN: 2.0000 mg | Freq: Three times a day (TID) | INTRAMUSCULAR | Status: DC | PRN

## 2023-12-31 MED ORDER — LORAZEPAM 1 MG PO TABS: 1.0000 mg | ORAL_TABLET | Freq: Two times a day (BID) | ORAL | Status: DC

## 2023-12-31 MED ORDER — HYDROXYZINE HCL 25 MG PO TABS: 25.0000 mg | ORAL_TABLET | Freq: Four times a day (QID) | ORAL | Status: DC | PRN

## 2023-12-31 MED ORDER — HALOPERIDOL LACTATE 5 MG/ML IJ SOLN: 5.0000 mg | Freq: Three times a day (TID) | INTRAMUSCULAR | Status: DC | PRN

## 2023-12-31 MED ORDER — ACETAMINOPHEN 325 MG PO TABS: 650.0000 mg | ORAL_TABLET | Freq: Four times a day (QID) | ORAL | Status: DC | PRN

## 2023-12-31 MED ORDER — LORAZEPAM 1 MG PO TABS
1.0000 mg | ORAL_TABLET | Freq: Four times a day (QID) | ORAL | Status: DC | PRN
Start: 2023-12-31 — End: 2023-12-31

## 2023-12-31 MED ORDER — HALOPERIDOL LACTATE 5 MG/ML IJ SOLN: 10.0000 mg | Freq: Three times a day (TID) | INTRAMUSCULAR | Status: DC | PRN

## 2023-12-31 MED ORDER — LEVOTHYROXINE SODIUM 75 MCG PO TABS: 150.0000 ug | ORAL_TABLET | Freq: Once | ORAL | Status: DC

## 2023-12-31 MED ORDER — LORAZEPAM 1 MG PO TABS: 1.0000 mg | ORAL_TABLET | Freq: Four times a day (QID) | ORAL | Status: DC

## 2023-12-31 MED ORDER — DIPHENHYDRAMINE HCL 50 MG/ML IJ SOLN
50.0000 mg | Freq: Three times a day (TID) | INTRAMUSCULAR | Status: DC | PRN
Start: 2023-12-31 — End: 2023-12-31

## 2023-12-31 MED ORDER — NICOTINE 21 MG/24HR TD PT24
21.0000 mg | MEDICATED_PATCH | Freq: Every day | TRANSDERMAL | Status: DC
  Administered 2024-01-01 – 2024-01-04 (×4): 21 mg via TRANSDERMAL
  Filled 2023-12-31 (×4): qty 1

## 2023-12-31 MED ORDER — LORAZEPAM 1 MG PO TABS: 1.0000 mg | ORAL_TABLET | Freq: Every day | ORAL | Status: DC

## 2023-12-31 MED ORDER — LOPERAMIDE HCL 2 MG PO CAPS: 2.0000 mg | ORAL_CAPSULE | ORAL | Status: DC | PRN

## 2023-12-31 MED ORDER — MAGNESIUM HYDROXIDE 400 MG/5ML PO SUSP: 30.0000 mL | Freq: Every day | ORAL | Status: DC | PRN

## 2023-12-31 MED ORDER — THIAMINE MONONITRATE 100 MG PO TABS
100.0000 mg | ORAL_TABLET | Freq: Every day | ORAL | Status: DC
  Administered 2024-01-01 – 2024-01-04 (×4): 100 mg via ORAL
  Filled 2023-12-31 (×4): qty 1

## 2023-12-31 MED ORDER — LORAZEPAM 1 MG PO TABS
1.0000 mg | ORAL_TABLET | Freq: Three times a day (TID) | ORAL | Status: AC
  Administered 2024-01-02 (×3): 1 mg via ORAL
  Filled 2023-12-31 (×3): qty 1

## 2023-12-31 MED ORDER — HALOPERIDOL LACTATE 5 MG/ML IJ SOLN
10.0000 mg | Freq: Three times a day (TID) | INTRAMUSCULAR | Status: DC | PRN
Start: 2023-12-31 — End: 2023-12-31

## 2023-12-31 MED ORDER — TRAZODONE HCL 50 MG PO TABS
50.0000 mg | ORAL_TABLET | Freq: Every day | ORAL | Status: DC
  Administered 2023-12-31 – 2024-01-01 (×2): 50 mg via ORAL
  Filled 2023-12-31 (×2): qty 1

## 2023-12-31 MED ORDER — THIAMINE HCL 100 MG/ML IJ SOLN
100.0000 mg | Freq: Once | INTRAMUSCULAR | Status: AC
  Administered 2023-12-31: 100 mg via INTRAMUSCULAR
  Filled 2023-12-31: qty 2

## 2023-12-31 MED ORDER — TRAZODONE HCL 50 MG PO TABS: 50.0000 mg | ORAL_TABLET | Freq: Every day | ORAL | Status: DC

## 2023-12-31 MED ORDER — LOPERAMIDE HCL 2 MG PO CAPS
2.0000 mg | ORAL_CAPSULE | ORAL | Status: AC | PRN
  Administered 2024-01-01: 4 mg via ORAL
  Filled 2023-12-31: qty 2

## 2023-12-31 MED ORDER — LORAZEPAM 1 MG PO TABS
1.0000 mg | ORAL_TABLET | Freq: Four times a day (QID) | ORAL | Status: AC
  Administered 2023-12-31 – 2024-01-01 (×5): 1 mg via ORAL
  Filled 2023-12-31 (×5): qty 1

## 2023-12-31 MED ORDER — NICOTINE 21 MG/24HR TD PT24: 21.0000 mg | MEDICATED_PATCH | Freq: Every day | TRANSDERMAL | Status: DC

## 2023-12-31 MED ORDER — HYDROXYZINE HCL 25 MG PO TABS
25.0000 mg | ORAL_TABLET | Freq: Four times a day (QID) | ORAL | Status: AC | PRN
  Administered 2024-01-02 – 2024-01-03 (×2): 25 mg via ORAL
  Filled 2023-12-31 (×2): qty 1

## 2023-12-31 MED ORDER — LORAZEPAM 1 MG PO TABS: 1.0000 mg | ORAL_TABLET | Freq: Three times a day (TID) | ORAL | Status: DC

## 2023-12-31 MED ORDER — ALUM & MAG HYDROXIDE-SIMETH 200-200-20 MG/5ML PO SUSP: 30.0000 mL | ORAL | Status: DC | PRN

## 2023-12-31 MED ORDER — THIAMINE MONONITRATE 100 MG PO TABS: 100.0000 mg | ORAL_TABLET | Freq: Every day | ORAL | Status: DC

## 2023-12-31 MED ORDER — ONDANSETRON 4 MG PO TBDP: 4.0000 mg | ORAL_TABLET | Freq: Four times a day (QID) | ORAL | Status: DC | PRN

## 2023-12-31 MED ORDER — LAMOTRIGINE 100 MG PO TABS: 100.0000 mg | ORAL_TABLET | Freq: Every day | ORAL | Status: DC

## 2023-12-31 MED ORDER — ACETAMINOPHEN 325 MG PO TABS
650.0000 mg | ORAL_TABLET | Freq: Four times a day (QID) | ORAL | Status: DC | PRN
  Administered 2024-01-02: 650 mg via ORAL
  Filled 2023-12-31: qty 2

## 2023-12-31 MED ORDER — LORAZEPAM 1 MG PO TABS
1.0000 mg | ORAL_TABLET | Freq: Two times a day (BID) | ORAL | Status: AC
  Administered 2024-01-03 (×2): 1 mg via ORAL
  Filled 2023-12-31 (×2): qty 1

## 2023-12-31 MED ORDER — VENLAFAXINE HCL ER 150 MG PO CP24: 150.0000 mg | ORAL_CAPSULE | Freq: Every day | ORAL | Status: DC
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
## 2023-12-31 NOTE — ED Notes (Signed)
 Pt oriented to unit, questions denied. Pt provided with dinner as requested. Pt denies SI, verbal contract for safety provided.

## 2023-12-31 NOTE — ED Notes (Signed)
Patient transferred to [facility] ?

## 2023-12-31 NOTE — Discharge Instructions (Addendum)
 Enclosed are some resources for outpatient detox services.  Get help right away if: You have thoughts about hurting yourself or others. Get help right away if you feel like you may hurt yourself or others, or have thoughts about taking your own life. Go to your nearest emergency room or: Call 911. Call the National Suicide Prevention Lifeline at 651-685-0611 or 988 in the [place] This is open 24 hours a day. If you're a Veteran: Call 988 and press 1. This is open 24 hours a day. Text the [employer] at 8303767560.  Pt was given the opportunity to ask questions, and all were answered to her satisfaction. Pt  verbalized feeling comfortable with her discharge plan, and denies any current suicidal or homicidal thoughts. Patient was instructed prior to discharge to: Take all medications as prescribed by her mental healthcare provider. Report any adverse effects and or reactions from the medicines to her outpatient provider promptly. Patient was instructed & cautioned: To not engage in alcohol and or illegal drug use while on prescription medicines. In the event of worsening symptoms, patient is instructed to call the mobile crisis hotline 208-024-7530, National Suicide prevention Lifeline (208)446-9050, 911 and/or go to the nearest ED for appropriate evaluation and treatment of symptoms. Pt educated to follow-up with her primary care provider for any other medical issues, concerns and or health care needs.   Summary Mental health is not just the absence of mental illness. It involves understanding your emotions and behaviors, and taking steps to manage them in a healthy way. If you have symptoms of mental or emotional distress, get help from family, friends, a health care provider, or a mental health professional. Practice good mental health behaviors such as stress management skills, self-calming skills, exercise, healthy sleeping and eating, and supportive relationships. This information is not  intended to replace advice given to you by your health care provider. Make sure you discuss any questions you have with your health care provider.  Education provided on the fact that if experiencing worsening of psychiatry symptoms including suicidal ideations, homicidal ideations, or having auditory/visual hallucinations, etc, to call 911, 988, come back to this location, or go to the nearest ER. Pt verbalized understanding.

## 2023-12-31 NOTE — Group Note (Signed)
 Group Topic: Identity and Relationships  Group Date: 12/31/2023 Start Time: 1715 End Time: 1750 Facilitators: Daved Tinnie HERO, RN  Department: [facility]  Number of Participants: 8  Group Focus: nursing group Treatment Modality:  Psychoeducation Interventions utilized were exploration Purpose: increase insight  Name: Lisa [name] Date of Birth: 23-Apr-1976  MR: [id number]    Level of Participation: pt arrived to unit after group was completed. Pt was provided with group handouts and encouraged to review them.   Patients Problems:  Patient Active Problem List   Diagnosis Date Noted   Alcohol use disorder, moderate, dependence (HCC) 12/31/2023   Pelvic pain 06/22/2021   S/P laparoscopic hysterectomy 06/22/2021   Elevated LFTs 02/16/2021   Right foot pain 02/15/2021   Smoker 11/02/2020   Pulmonary emphysema (HCC) 03/15/2020   Iron  deficiency anemia 05/01/2017   Medication management 01/24/2016   Vitamin D  deficiency 12/13/2014   Hyperlipidemia LDL goal <70 12/13/2014   BMI 23.0-23.9, adult 09/07/2014   Anxiety    Dissociative identity disorder (HCC) 10/19/2011   History of gastric bypass (2012) 10/11/2011   Seizure disorder (HCC) 10/11/2011   Type 2 diabetes mellitus with hyperlipidemia (HCC) 10/11/2011   Hypothyroidism 10/11/2011   Panic attacks 10/11/2011   PTSD (post-traumatic stress disorder) 09/20/2011   Depression 08/17/2011
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
## 2024-01-01 DIAGNOSIS — F1020 Alcohol dependence, uncomplicated: Secondary | ICD-10-CM | POA: Diagnosis present

## 2024-01-01 DIAGNOSIS — F332 Major depressive disorder, recurrent severe without psychotic features: Secondary | ICD-10-CM | POA: Diagnosis not present

## 2024-01-01 DIAGNOSIS — Z79899 Other long term (current) drug therapy: Secondary | ICD-10-CM | POA: Diagnosis not present

## 2024-01-01 DIAGNOSIS — F411 Generalized anxiety disorder: Secondary | ICD-10-CM | POA: Diagnosis not present

## 2024-01-01 DIAGNOSIS — G40909 Epilepsy, unspecified, not intractable, without status epilepticus: Secondary | ICD-10-CM | POA: Diagnosis not present

## 2024-01-01 DIAGNOSIS — Z91148 Patient's other noncompliance with medication regimen for other reason: Secondary | ICD-10-CM | POA: Diagnosis not present

## 2024-01-01 DIAGNOSIS — F10230 Alcohol dependence with withdrawal, uncomplicated: Secondary | ICD-10-CM | POA: Diagnosis not present

## 2024-01-01 LAB — HEMOGLOBIN A1C
Hgb A1c MFr Bld: 5.1 % (ref 4.8–5.6)
Mean Plasma Glucose: 100 mg/dL

## 2024-01-01 LAB — MAGNESIUM: Magnesium: 2 mg/dL (ref 1.7–2.4)

## 2024-01-01 LAB — CBC WITH DIFFERENTIAL/PLATELET
Abs Immature Granulocytes: 0.02 K/uL (ref 0.00–0.07)
Basophils Absolute: 0.1 K/uL (ref 0.0–0.1)
Basophils Relative: 1 %
Eosinophils Absolute: 0.3 K/uL (ref 0.0–0.5)
Eosinophils Relative: 5 %
HCT: 38.8 % (ref 36.0–46.0)
Hemoglobin: 13.2 g/dL (ref 12.0–15.0)
Immature Granulocytes: 0 %
Lymphocytes Relative: 34 %
Lymphs Abs: 2.1 K/uL (ref 0.7–4.0)
MCH: 35.2 pg — ABNORMAL HIGH (ref 26.0–34.0)
MCHC: 34 g/dL (ref 30.0–36.0)
MCV: 103.5 fL — ABNORMAL HIGH (ref 80.0–100.0)
Monocytes Absolute: 0.7 K/uL (ref 0.1–1.0)
Monocytes Relative: 12 %
Neutro Abs: 2.9 K/uL (ref 1.7–7.7)
Neutrophils Relative %: 48 %
Platelets: 213 K/uL (ref 150–400)
RBC: 3.75 MIL/uL — ABNORMAL LOW (ref 3.87–5.11)
RDW: 12.1 % (ref 11.5–15.5)
WBC: 6.1 K/uL (ref 4.0–10.5)
nRBC: 0 % (ref 0.0–0.2)

## 2024-01-01 LAB — COMPREHENSIVE METABOLIC PANEL WITH GFR
ALT: 90 U/L — ABNORMAL HIGH (ref 0–44)
AST: 139 U/L — ABNORMAL HIGH (ref 15–41)
Albumin: 4.1 g/dL (ref 3.5–5.0)
Alkaline Phosphatase: 93 U/L (ref 38–126)
Anion gap: 14 (ref 5–15)
BUN: 11 mg/dL (ref 6–20)
CO2: 25 mmol/L (ref 22–32)
Calcium: 9.7 mg/dL (ref 8.9–10.3)
Chloride: 100 mmol/L (ref 98–111)
Creatinine, Ser: 0.74 mg/dL (ref 0.44–1.00)
GFR, Estimated: >60 mL/min (ref >60)
Glucose, Bld: 106 mg/dL — ABNORMAL HIGH (ref 70–99)
Potassium: 3.5 mmol/L (ref 3.5–5.1)
Sodium: 139 mmol/L (ref 135–145)
Total Bilirubin: 1.7 mg/dL — ABNORMAL HIGH (ref 0.0–1.2)
Total Protein: 7 g/dL (ref 6.5–8.1)

## 2024-01-01 LAB — LIPID PANEL
Cholesterol: 225 mg/dL — ABNORMAL HIGH (ref 0–200)
HDL: 122 mg/dL
LDL Cholesterol: 93 mg/dL (ref 0–99)
Total CHOL/HDL Ratio: 1.8 ratio
Triglycerides: 51 mg/dL
VLDL: 10 mg/dL (ref 0–40)

## 2024-01-01 LAB — TSH: TSH: 30.258 u[IU]/mL — ABNORMAL HIGH (ref 0.350–4.500)

## 2024-01-01 LAB — VALPROIC ACID LEVEL: Valproic Acid Lvl: <10 ug/mL — ABNORMAL LOW (ref 50–100)

## 2024-01-01 LAB — ETHANOL: Alcohol, Ethyl (B): <15 mg/dL (ref <15)

## 2024-01-01 MED ORDER — FLUTICASONE PROPIONATE 50 MCG/ACT NA SUSP: 2.0000 | Freq: Every day | NASAL | Status: DC | PRN

## 2024-01-01 MED ORDER — NICOTINE POLACRILEX 2 MG MT GUM
2.0000 mg | CHEWING_GUM | Freq: Once | OROMUCOSAL | Status: AC
  Administered 2024-01-01: 2 mg via ORAL
  Filled 2024-01-01: qty 1

## 2024-01-01 MED ORDER — PANTOPRAZOLE SODIUM 40 MG PO TBEC
80.0000 mg | DELAYED_RELEASE_TABLET | Freq: Every day | ORAL | Status: DC
  Administered 2024-01-01 – 2024-01-04 (×4): 80 mg via ORAL
  Filled 2024-01-01 (×4): qty 2

## 2024-01-01 MED ORDER — DIVALPROEX SODIUM 500 MG PO DR TAB
500.0000 mg | DELAYED_RELEASE_TABLET | Freq: Two times a day (BID) | ORAL | Status: DC
  Administered 2024-01-01 – 2024-01-03 (×4): 500 mg via ORAL
  Filled 2024-01-01 (×4): qty 1

## 2024-01-01 MED ORDER — LORATADINE 10 MG PO TABS: 10.0000 mg | ORAL_TABLET | Freq: Every day | ORAL | Status: DC | PRN

## 2024-01-01 MED ORDER — DIVALPROEX SODIUM 500 MG PO DR TAB: 500.0000 mg | DELAYED_RELEASE_TABLET | Freq: Two times a day (BID) | ORAL | Status: DC

## 2024-01-01 NOTE — ED Notes (Signed)
 Pt presents at nurses station. Anxious affect.  Pt denied current SI plan and intent.  Pt reports malaise, nausea, diarrhea and anxiety.  Ativan  taper for ETOH withdrawal continues Q 15 minute observation for safety continue.

## 2024-01-01 NOTE — ED Notes (Signed)
 Patient is sleeping. Respirations equal and unlabored. No change in assessment or acuity. Routine safety checks conducted according to facility protocol.
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
## 2024-01-01 NOTE — ED Notes (Signed)
Patient attending AA meeting. 

## 2024-01-01 NOTE — ED Notes (Signed)
 Pt is sleeping . No acute distress noted.

## 2024-01-01 NOTE — ED Notes (Signed)
 Pt is currently attending AA group  Calm and cooperative. Q 15 minute observations for safety continue

## 2024-01-01 NOTE — Group Note (Signed)
 Group Topic: Relapse and Recovery  Group Date: 01/01/2024 Start Time: 1215 End Time: 1310 Facilitators: Stanly Stabile, RN  Department: [facility]  Number of Participants: 9  Group Focus: chemical dependency education, chemical dependency issues, clarity of thought, communication, coping skills, and dual diagnosis Treatment Modality:  Behavior Modification Therapy Interventions utilized were clarification, confrontation, exploration, group exercise, patient education, problem solving, reality testing, and story telling Purpose: enhance coping skills, explore maladaptive thinking, express feelings, express irrational fears, improve communication skills, increase insight, regain self-worth, reinforce self-care, relapse prevention strategies, and trigger / craving management  Name: Lisa [name] Date of Birth: 01-07-1976  MR: [id number]    Level of Participation: active Quality of Participation: attentive and cooperative Interactions with others: gave feedback Mood/Affect: appropriate Triggers (if applicable):   Cognition: coherent/clear and goal directed Progress: Gaining insight Response:   Plan: follow-up needed  Patients Problems:  Patient Active Problem List   Diagnosis Date Noted   Alcohol use disorder, moderate, dependence (HCC) 12/31/2023   Pelvic pain 06/22/2021   S/P laparoscopic hysterectomy 06/22/2021   Elevated LFTs 02/16/2021   Right foot pain 02/15/2021   Smoker 11/02/2020   Pulmonary emphysema (HCC) 03/15/2020   Iron  deficiency anemia 05/01/2017   Medication management 01/24/2016   Vitamin D  deficiency 12/13/2014   Hyperlipidemia LDL goal <70 12/13/2014   BMI 23.0-23.9, adult 09/07/2014   Anxiety    Dissociative identity disorder (HCC) 10/19/2011   History of gastric bypass (2012) 10/11/2011   Seizure disorder (HCC) 10/11/2011   Type 2 diabetes mellitus with hyperlipidemia (HCC) 10/11/2011   Hypothyroidism 10/11/2011    Panic attacks 10/11/2011   PTSD (post-traumatic stress disorder) 09/20/2011   Depression 08/17/2011

## 2024-01-01 NOTE — Group Note (Signed)
 Group Topic: Social Support  Group Date: 01/01/2024 Start Time: 1720 End Time: 1750 Facilitators: Celinda Suzen NOVAK, NT  Department: [facility]  Number of Participants: 10  Group Focus: relapse prevention Treatment Modality:  Psychoeducation Interventions utilized were support Purpose: increase insight and regain self-worth  Name: CLYDEAN POSAS Date of Birth: Nov 24, 1975  MR: [id number]    Level of Participation: active Quality of Participation: cooperative and engaged Interactions with others: gave feedback Mood/Affect: appropriate Triggers (if applicable): n/a Cognition: coherent/clear Progress: Gaining insight Response: PT stated that her issue was with alcohol (it didn't matter which) Plan: patient will be encouraged to attend group  Patients Problems:  Patient Active Problem List   Diagnosis Date Noted   Alcohol use disorder, moderate, dependence (HCC) 12/31/2023   Pelvic pain 06/22/2021   S/P laparoscopic hysterectomy 06/22/2021   Elevated LFTs 02/16/2021   Right foot pain 02/15/2021   Smoker 11/02/2020   Pulmonary emphysema (HCC) 03/15/2020   Iron  deficiency anemia 05/01/2017   Medication management 01/24/2016   Vitamin D  deficiency 12/13/2014   Hyperlipidemia LDL goal <70 12/13/2014   BMI 23.0-23.9, adult 09/07/2014   Anxiety    Dissociative identity disorder (HCC) 10/19/2011   History of gastric bypass (2012) 10/11/2011   Seizure disorder (HCC) 10/11/2011   Type 2 diabetes mellitus with hyperlipidemia (HCC) 10/11/2011   Hypothyroidism 10/11/2011   Panic attacks 10/11/2011   PTSD (post-traumatic stress disorder) 09/20/2011   Depression 08/17/2011

## 2024-01-01 NOTE — Discharge Planning (Signed)
 LCSW met with patient to assess current mood, affect, physical state, and inquire about needs/goals while here in [facility] and after discharge. Patient reports she presented due to alchol detox. She stated that she was drinking 1.75 liters of vodka daily and began drinking in the morning at times and at lunch.  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 She lives independently in her own place.  Patient drives and reports to have a great support system of her family and boyfriend.   Patient is followed by a psychiatrist, Dr. Pearlene for last 15 years and reports to take medications and has remained compliant with them. She also see's a therapist virtually twice weekly with Randine Collet.   Patient reports her current goal is to seek SAIOP or DCIOP outpatient resources for substance use and is agreeable to come upstairs for this program here. SW will assist with scheduling her assessment post discharge. She anticipates being here for no more than 3 days due to work obligations.   Patient denies any prior history of outpatient or inpatient substance abuse treatment. Patient currently denies any SI/HI/AVH and reports mood as calm and cooperative.   Patient aware that LCSW will send referrals out for review and will follow up to provide updates as received. Patient expressed appreciation of LCSW assistance. No other needs were reported at this time by patient.

## 2024-01-01 NOTE — ED Notes (Signed)
 Patient was meeting with doctor and was not able to attend social work group.

## 2024-01-02 MED ORDER — NALTREXONE HCL 50 MG PO TABS: 50.0000 mg | ORAL_TABLET | Freq: Every day | ORAL | Status: DC

## 2024-01-02 MED ORDER — TRAZODONE HCL 50 MG PO TABS
50.0000 mg | ORAL_TABLET | Freq: Every evening | ORAL | Status: DC | PRN
  Administered 2024-01-02 – 2024-01-03 (×2): 50 mg via ORAL
  Filled 2024-01-02 (×2): qty 1

## 2024-01-02 MED ORDER — IBUPROFEN 400 MG PO TABS
400.0000 mg | ORAL_TABLET | Freq: Four times a day (QID) | ORAL | Status: DC | PRN
  Administered 2024-01-03: 400 mg via ORAL
  Filled 2024-01-02: qty 1

## 2024-01-02 NOTE — ED Notes (Signed)
 Patient was given vistaril  for anxiety and several minutes later reported that she threw up her dinner and the pill that was given.  Patient wanted another pill.  Patient informed that it would not be possible or recommended that she take any other medication at this time.  RN suggested she chew on some ice chips.  She reports that she feels better  after throwing up.  She states she is less nauseous.  Will monitor and provide support and treatment as needed.  Dr Zouev made aware.

## 2024-01-02 NOTE — Group Note (Signed)
 Group Topic: Healthy Self Image and Positive Change  Group Date: 01/02/2024 Start Time: 1245 End Time: 1315 Facilitators: Stanly Stabile, RN  Department: [facility]  Number of Participants: 10  Group Focus: affirmation, coping skills, problem solving, and self-esteem Treatment Modality:  Behavior Modification Therapy Interventions utilized were assignment, clarification, exploration, group exercise, patient education, problem solving, and support Purpose: enhance coping skills, explore maladaptive thinking, express irrational fears, increase insight, regain self-worth, reinforce self-care, and relapse prevention strategies  Name: Lisa [name] Date of Birth: 01-Nov-1975  MR: [id number]    Level of Participation: active Quality of Participation: attentive and cooperative Interactions with others: gave feedback Mood/Affect: appropriate Triggers (if applicable):   Cognition: coherent/clear Progress: Gaining insight Response:   Plan: follow-up needed  Patients Problems:  Patient Active Problem List   Diagnosis Date Noted   Alcohol use disorder, moderate, dependence (HCC) 12/31/2023   Pelvic pain 06/22/2021   S/P laparoscopic hysterectomy 06/22/2021   Elevated LFTs 02/16/2021   Right foot pain 02/15/2021   Smoker 11/02/2020   Pulmonary emphysema (HCC) 03/15/2020   Iron  deficiency anemia 05/01/2017   Medication management 01/24/2016   Vitamin D  deficiency 12/13/2014   Hyperlipidemia LDL goal <70 12/13/2014   BMI 23.0-23.9, adult 09/07/2014   Anxiety    Dissociative identity disorder (HCC) 10/19/2011   History of gastric bypass (2012) 10/11/2011   Seizure disorder (HCC) 10/11/2011   Type 2 diabetes mellitus with hyperlipidemia (HCC) 10/11/2011   Hypothyroidism 10/11/2011   Panic attacks 10/11/2011   PTSD (post-traumatic stress disorder) 09/20/2011   Depression 08/17/2011

## 2024-01-02 NOTE — Discharge Planning (Signed)
 SW was able to get patients follow up appointment for SAIOP assessment scheduled upstairs for Thursday 7/17 @ 9:30 with Darice Simpler. Instructions will be provided on patients AVS upon DC.

## 2024-01-02 NOTE — ED Notes (Signed)
 Patient observed/assessed at bedside lying in bed asleep. Patient alert and oriented to self and location. Patient reports tiredness and has been sleeping most of the time. He denies A/V/H. He denies having any thoughts/plan of self harm and harm towards others.   Verbalizes no further complaints at this time.

## 2024-01-02 NOTE — ED Notes (Signed)
Pt is sleeping, no acute distress noted.

## 2024-01-02 NOTE — ED Provider Notes (Signed)
 Behavioral Health Progress Note  Date and Time: 01/02/2024 5:53 PM Name: Lisa [name] MRN:  [id number]  Subjective:  Patient reports good sleep and appetite and no longer reports feeling drowsy. Completing Ativan  taper and tolerating restarting Depakote  for epilepsy. Patient denies depression or anxiety. Denies SI, HI or AVH. Patient was asking about discharge tomorrow however I discussed that she would be at risk for seizure without finishing her taper which ends on Saturday. Denies side effects from medications. WE discussed starting naltrexone  versus acamprosate, will repeat LFTs in the AM.  Diagnosis:  Final diagnoses:  MDD (major depressive disorder), recurrent severe, without psychosis (HCC)  GAD (generalized anxiety disorder)  Alcohol dependence with uncomplicated withdrawal (HCC)    Total Time spent with patient: 30 minutes  Past Psychiatric History:  Outpatient: Wilbert Collet who is her therapist and Dr. Vincente is her psychiatrist Medications: she is prescribed Adderall, Valium  5 mg BID, and Prisiq. 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   Depression     Diabetes mellitus without complication Type 2 diet controlled     Epilepsy (HCC)      dx age 26   Habitual abortion history, antepartum     Headache(784.0)     History of diabetes mellitus type 2      prior to gastric bypass in 2012   history of migraine      none since dec 2022   History of recurrent miscarriages, not currently pregnant  10/11/2011   History of sleep apnea      no osa sonce 2013 weight loss after gastric bypass   Hypothyroidism     Infertility associated with anovulation     Obesity      BMi 33   Personal history of COVID-19 07/2020    fever, migraine, extreme lethargy x  1 week all symptoms resolved   Seasonal allergies 08/09/2021   Seizures (HCC)      spring 2022 twitching of right hand only   UTI (lower urinary tract infection) 06/2021    after 05-2022 surgery        Family History: denies Social History: patient lives with her parents  Additional Social History:                         Sleep: Fair  Appetite:  Fair  Current Medications:  Current Facility-Administered Medications  Medication Dose Route Frequency Provider Last Rate Last Admin   acetaminophen  (TYLENOL ) tablet 650 mg  650 mg Oral Q6H PRN Olasunkanmi, Oluwatosin, NP   650 mg at 01/02/24 1250   alum & mag hydroxide-simeth (MAALOX/MYLANTA) 200-200-20 MG/5ML suspension 30 mL  30 mL Oral Q4H PRN Olasunkanmi, Oluwatosin, NP       haloperidol  (HALDOL ) tablet 5 mg  5 mg Oral TID PRN Olasunkanmi, Oluwatosin, NP       And   diphenhydrAMINE  (BENADRYL ) capsule 50 mg  50 mg Oral TID  PRN Olasunkanmi, Oluwatosin, NP       haloperidol  lactate (HALDOL ) injection 5 mg  5 mg Intramuscular TID PRN Olasunkanmi, Oluwatosin, NP       And   diphenhydrAMINE  (BENADRYL ) injection 50 mg  50 mg Intramuscular TID PRN Olasunkanmi, Oluwatosin, NP       And   LORazepam  (ATIVAN ) injection 2 mg  2 mg Intramuscular TID PRN Olasunkanmi, Oluwatosin, NP       haloperidol  lactate (HALDOL ) injection 10 mg  10 mg Intramuscular TID PRN Olasunkanmi, Oluwatosin, NP       And   diphenhydrAMINE  (BENADRYL ) injection 50 mg  50 mg Intramuscular TID PRN Olasunkanmi, Oluwatosin, NP       And   LORazepam  (ATIVAN ) injection 2 mg  2 mg Intramuscular TID PRN Olasunkanmi, Oluwatosin, NP       divalproex  (DEPAKOTE ) DR tablet 500 mg  500 mg Oral Q12H Swayzee Wadley, MD    500 mg at 01/02/24 0843   fluticasone  (FLONASE ) 50 MCG/ACT nasal spray 2 spray  2 spray Each Nare Daily PRN Adrin Julian, MD       hydrOXYzine  (ATARAX ) tablet 25 mg  25 mg Oral Q6H PRN Olasunkanmi, Oluwatosin, NP   25 mg at 01/02/24 1725   levothyroxine  (SYNTHROID ) tablet 150 mcg  150 mcg Oral Q0600 Olasunkanmi, Oluwatosin, NP   150 mcg at 01/02/24 0627   loperamide  (IMODIUM ) capsule 2-4 mg  2-4 mg Oral PRN Olasunkanmi, Oluwatosin, NP   4 mg at 01/01/24 9096   loratadine  (CLARITIN ) tablet 10 mg  10 mg Oral Daily PRN Angelicia Lessner, MD       LORazepam  (ATIVAN ) tablet 1 mg  1 mg Oral TID Sona Nations, MD   1 mg at 01/02/24 1623   Followed by   [START ON 01/03/2024] LORazepam  (ATIVAN ) tablet 1 mg  1 mg Oral BID Koven Belinsky, MD       Followed by   NOREEN ON 01/04/2024] LORazepam  (ATIVAN ) tablet 1 mg  1 mg Oral Daily Oanh Devivo, MD       magnesium  hydroxide (MILK OF MAGNESIA) suspension 30 mL  30 mL Oral Daily PRN Olasunkanmi, Oluwatosin, NP       nicotine  (NICODERM CQ  - dosed in mg/24 hours) patch 21 mg  21 mg Transdermal Q0600 Olasunkanmi, Oluwatosin, NP   21 mg at 01/02/24 9372   ondansetron  (ZOFRAN -ODT) disintegrating tablet 4 mg  4 mg Oral Q6H PRN Olasunkanmi, Oluwatosin, NP   4 mg at 01/02/24 0907   pantoprazole  (PROTONIX ) EC tablet 80 mg  80 mg Oral Daily Davell Beckstead, MD   80 mg at 01/02/24 0843   thiamine  (VITAMIN B1) tablet 100 mg  100 mg Oral Daily Olasunkanmi, Oluwatosin, NP   100 mg at 01/02/24 0843   traZODone  (DESYREL ) tablet 50 mg  50 mg Oral QHS PRN Samer Dutton, MD       venlafaxine  XR (EFFEXOR -XR) 24 hr capsule 150 mg  150 mg Oral Q breakfast Olasunkanmi, Oluwatosin, NP   150 mg at 01/02/24 9156   Current Outpatient Medications  Medication Sig Dispense Refill   amphetamine-dextroamphetamine (ADDERALL XR) 10 MG 24 hr capsule Take 10 mg by mouth daily at 12 noon.     amphetamine-dextroamphetamine (ADDERALL XR) 20 MG 24 hr capsule Take 20 mg by mouth every morning.      desvenlafaxine  (PRISTIQ ) 100 MG 24 hr tablet Take 100 mg by mouth daily.     diazepam  (VALIUM ) 5 MG tablet Take 5 mg by mouth 2 (two)  times daily as needed for anxiety.     fluticasone  (FLONASE ) 50 MCG/ACT nasal spray Place 2 sprays into both nostrils daily as needed for allergies.     Levothyroxine  Sodium 150 MCG CAPS Take  1 tablet  Daily  on an empty stomach with only water for 30 minutes & no Antacid meds, Calcium or Magnesium  for 4 hours & avoid Biotin                                                                                                                                  /                                                                                 TAKE                                                         BY                                                        MOUTH (Patient taking differently: Take 150 mcg by mouth daily. Take on an empty stomach with only water for 30 minutes & no Antacid meds, Calcium or Magnesium  for 4 hours & avoid Biotin) 90 capsule 3   loratadine  (CLARITIN ) 10 MG tablet Take 10 mg by mouth daily as needed for allergies.     naproxen sodium (ALEVE) 220 MG tablet Take 440 mg by mouth 2 (two) times daily as needed (For pain).     traZODone  (DESYREL ) 50 MG tablet Take 1 tablet at Bedtime for Sleep (Patient taking differently: Take 50 mg by mouth at bedtime as needed for sleep.) 90 tablet 3   Esketamine HCl, 84 MG Dose, (SPRAVATO, 84 MG DOSE,) 28 MG/DEVICE SOPK Place 1 spray into the nose as needed (For depression). (Patient not taking: Reported on 01/01/2024)      Labs  Lab Results:  Admission on 12/31/2023  Component Date Value Ref Range Status   WBC 01/01/2024 6.1  4.0 - 10.5 K/uL Final   RBC 01/01/2024 3.75 (L)  3.87 - 5.11 MIL/uL Final   Hemoglobin 01/01/2024 13.2  12.0 - 15.0 g/dL Final   HCT 92/90/7974 38.8  36.0 - 46.0 % Final   MCV 01/01/2024 103.5 (H)  80.0 - 100.0 fL Final   MCH 01/01/2024 35.2 (H)  26.0 - 34.0 pg Final   MCHC 01/01/2024 34.0  30.0  - 36.0 g/dL Final   RDW 92/90/7974 12.1  11.5 - 15.5 % Final   Platelets 01/01/2024 213  150 - 400 K/uL Final   nRBC 01/01/2024 0.0  0.0 - 0.2 % Final   Neutrophils Relative % 01/01/2024 48  % Final   Neutro Abs 01/01/2024 2.9  1.7 - 7.7 K/uL Final   Lymphocytes Relative 01/01/2024 34  % Final   Lymphs Abs 01/01/2024 2.1  0.7 - 4.0 K/uL Final   Monocytes Relative 01/01/2024 12  % Final   Monocytes Absolute 01/01/2024 0.7  0.1 - 1.0 K/uL Final   Eosinophils Relative 01/01/2024 5  % Final   Eosinophils Absolute 01/01/2024 0.3  0.0 - 0.5 K/uL Final   Basophils Relative 01/01/2024 1  % Final   Basophils Absolute 01/01/2024 0.1  0.0 - 0.1 K/uL Final   Immature Granulocytes 01/01/2024 0  % Final   Abs Immature Granulocytes 01/01/2024 0.02  0.00 - 0.07 K/uL Final   Performed at [facility], [address], [place], [place] [address]   Sodium 01/01/2024 139  135 - 145 mmol/L Final   Potassium 01/01/2024 3.5  3.5 - 5.1 mmol/L Final   Chloride 01/01/2024 100  98 - 111 mmol/L Final   CO2 01/01/2024 25  22 - 32 mmol/L Final   Glucose, Bld 01/01/2024 106 (H)  70 - 99 mg/dL Final   Glucose reference range applies only to samples taken after fasting for at least 8 hours.   BUN 01/01/2024 11  6 - 20 mg/dL Final   Creatinine, Ser 01/01/2024 0.74  0.44 - 1.00 mg/dL Final   Calcium 92/90/7974 9.7  8.9 - 10.3 mg/dL Final   Total Protein 92/90/7974 7.0  6.5 - 8.1 g/dL Final   Albumin 92/90/7974 4.1  3.5 - 5.0 g/dL Final   AST 92/90/7974 139 (H)  15 - 41 U/L Final   ALT 01/01/2024 90 (H)  0 - 44 U/L Final   Alkaline Phosphatase 01/01/2024 93  38 - 126 U/L Final   Total Bilirubin 01/01/2024 1.7 (H)  0.0 - 1.2 mg/dL Final   GFR, Estimated 01/01/2024 >60  >60 mL/min Final   Comment: (NOTE) Calculated using the CKD-EPI Creatinine Equation (2021)    Anion gap 01/01/2024 14  5 - 15 Final   Performed at [facility], [address] [place], [place] [address]   Hgb A1c MFr Bld 01/01/2024 5.1   4.8 - 5.6 % Final   Comment: (NOTE)         Prediabetes: 5.7 - 6.4         Diabetes: >6.4         Glycemic control for adults with diabetes: <7.0    Mean Plasma Glucose 01/01/2024 100  mg/dL Final   Comment: (NOTE) Performed At: [facility] [address] [place], [place] [id number] Jennette Shorter MD Ey:[id number]    Magnesium  01/01/2024 2.0  1.7 - 2.4 mg/dL Final   Performed at [facility], [address], [place], [place] [address]   Alcohol, Ethyl (B) 01/01/2024 <15  <15 mg/dL Final   Comment: (NOTE) For medical purposes only. Performed at [facility], [address] [place], [place] [address]    Cholesterol 01/01/2024 225 (H)  0 - 200 mg/dL Final   Triglycerides 92/90/7974 51  <150 mg/dL Final  HDL 01/01/2024 122  >40 mg/dL Final   Total CHOL/HDL Ratio 01/01/2024 1.8  RATIO Final   VLDL 01/01/2024 10  0 - 40 mg/dL Final   LDL Cholesterol 01/01/2024 93  0 - 99 mg/dL Final   Comment:        Total Cholesterol/HDL:CHD Risk Coronary Heart Disease Risk Table                     Men   Women  1/2 Average Risk   3.4   3.3  Average Risk       5.0   4.4  2 X Average Risk   9.6   7.1  3 X Average Risk  23.4   11.0        Use the calculated Patient Ratio above and the CHD Risk Table to determine the patient's CHD Risk.        ATP III CLASSIFICATION (LDL):  <100     mg/dL   Optimal  899-870  mg/dL   Near or Above                    Optimal  130-159  mg/dL   Borderline  839-810  mg/dL   High  >809     mg/dL   Very High Performed at [facility], [address], [place], [place] [address]    TSH 01/01/2024 30.258 (H)  0.350 - 4.500 uIU/mL Final   Comment: Performed by a 3rd Generation assay with a functional sensitivity of <=0.01 uIU/mL. Performed at [facility], [address] [place], [place] [address]    Valproic  Acid Lvl 01/01/2024 <10 (L)  50 - 100 ug/mL Final   Comment: RESULT CONFIRMED BY MANUAL DILUTION Performed at [facility], [address] [place], [place] [address]   Admission on 12/31/2023, Discharged on 12/31/2023  Component Date Value Ref Range Status   Preg Test, Ur 12/31/2023 Negative  Negative Final   POC Amphetamine UR 12/31/2023 None Detected  NONE DETECTED (Cut Off Level 1000 ng/mL) Final   POC Secobarbital (BAR) 12/31/2023 None Detected  NONE DETECTED (Cut Off Level 300 ng/mL) Final   POC Buprenorphine (BUP) 12/31/2023 None Detected  NONE DETECTED (Cut Off Level 10 ng/mL) Final   POC Oxazepam (BZO) 12/31/2023 Positive (A)  NONE DETECTED (Cut Off Level 300 ng/mL) Final   POC Cocaine UR 12/31/2023 None Detected  NONE DETECTED (Cut Off Level 300 ng/mL) Final   POC Methamphetamine UR 12/31/2023 None Detected  NONE DETECTED (Cut Off Level 1000 ng/mL) Final   POC Morphine 12/31/2023 None Detected  NONE DETECTED (Cut Off Level 300 ng/mL) Final   POC Methadone UR 12/31/2023 None Detected  NONE DETECTED (Cut Off Level 300 ng/mL) Final   POC Oxycodone  UR 12/31/2023 None Detected  NONE DETECTED (Cut Off Level 100 ng/mL) Final   POC Marijuana UR 12/31/2023 None Detected  NONE DETECTED (Cut Off Level 50 ng/mL) Final    Blood Alcohol level:  Lab Results  Component Value Date   [facility] <15 01/01/2024    Metabolic Disorder Labs: Lab Results  Component Value Date   HGBA1C 5.1 01/01/2024   MPG 100 01/01/2024   No results found for: PROLACTIN Lab Results  Component Value Date   CHOL 225 (H) 01/01/2024   TRIG 51 01/01/2024   HDL 122 01/01/2024   CHOLHDL 1.8 01/01/2024   VLDL 10 01/01/2024   LDLCALC 93 01/01/2024   LDLCALC 97 10/31/2022  Therapeutic Lab Levels: No results found for: LITHIUM Lab Results  Component Value Date   VALPROATE <10 (L) 01/01/2024   VALPROATE 33 (L) 01/29/2020   No results found for: CBMZ  Physical Findings   Flowsheet Row ED from 12/31/2023 in [facility] Most recent reading at 12/31/2023  6:13 PM ED from 12/31/2023 in [facility] Most recent reading at 12/31/2023  3:10 PM ED from 12/31/2023 in [facility] Most recent reading at 12/31/2023 11:32 AM  C-SSRS RISK CATEGORY No Risk No Risk No Risk     Musculoskeletal  Strength & Muscle Tone: within normal limits Gait & Station: normal Patient leans: N/A  Psychiatric Specialty Exam  Presentation  General Appearance:  Appropriate for Environment  Eye Contact: Fair  Speech: Clear and Coherent  Speech Volume: Normal  Handedness: Right   Mood and Affect  Mood: Euthymic  Affect: Congruent   Thought Process  Thought Processes: Coherent  Descriptions of Associations:Intact  Orientation:Full (Time, Place and Person)  Thought Content:Logical  Diagnosis of Schizophrenia or Schizoaffective disorder in past: No    Hallucinations:Hallucinations: None  Ideas of Reference:None  Suicidal Thoughts:Suicidal Thoughts: No  Homicidal Thoughts:Homicidal Thoughts: No   Sensorium  Memory: Immediate Fair  Judgment: Fair  Insight: Fair   Chartered certified accountant: Fair  Attention Span: Fair  Recall: [employer] of Knowledge: Fair  Language: Fair   Psychomotor Activity  Psychomotor Activity: Psychomotor Activity: Normal   Assets  Assets: Communication Skills; Desire for Improvement; Housing; Physical Health; Resilience; Social Support   Sleep  Sleep: Sleep: Fair  Estimated Sleeping Duration (Last 24 Hours): 12.50-14.75 hours  No data recorded  Physical Exam  Physical exam:  General: Well developed, well nourished, Caucasian female Pupils: Normal at 3mm Respiratory: Breathing is unlabored.  Cardiovascular: No edema.  Language: No anomia, no aphasia Muscle strength and tone-pt moving all extremities.  Gait not assessed as pt remained in bed.  Neuro: Facial muscles are symmetric. Pt without tremor, no evidence of hyperarousal.    Review of Systems   Constitutional: Negative.   HENT: Negative.    Eyes: Negative.   Respiratory: Negative.    Cardiovascular: Negative.   Gastrointestinal: Negative.   Genitourinary: Negative.   Musculoskeletal: Negative.   Skin: Negative.   Neurological: Negative.   Endo/Heme/Allergies: Negative.   Psychiatric/Behavioral:  negative for depression and positive substance abuse. The patient denies feeling nervous/anxious and denies insomnia today   Blood pressure 119/87, pulse 90, temperature 98.4 F (36.9 C), temperature source Oral, resp. rate 17, last menstrual period 08/23/2020, SpO2 100%. There is no height or weight on file to calculate BMI.  Treatment Plan Summary: Daily contact with patient to assess and evaluate symptoms and progress in treatment and Medication management  01/01/24: Patient presents for alcohol detox and tolerating Ativan  taper well. Denies SI or HI. Presents with symptoms of MDD and GAD with likely worsening in setting of alcohol abuse.  Reports pristiq  was working but she does not have anyone to bring it in. Denies SI or HI. Denies wanting to be dead. Patient appears depressed however declines any medication changes stating she just presented to detox. Will continue ativan  taper.   The patient initially reported that she is taking valproic  acid for her seizure disorder but later stated that it may be Lamictal  instead. She was unable to recall the exact dosage of either medication. A call was placed to her pharmacy, which confirmed that there  have been no records of prescriptions for either valproic  acid or Lamictal  since 2024.   01/02/24:  Depakote  level in AM and repeat CBC, CMP. Patient tolerating all medications well and is euthymic. Denies SI or HI, denies AVH. Completing Ativan  taper due to seizure risk and history of epilepsy.   #MDD #GAD -cont Effexor  XR 150 mg qdaily as patient does not have Pristiq  with her -Trazodone  50mg  PO at bedtime - Insomnia   #Alcohol use disorder  severe with withdrawal -Ativan  taper + CIWA with PRN ATivan   - will repeat LFTS in AM patient denies any abdominal pain, nausea, vomitting, consider starting naltrexone  versus acamprosate   Meds Continued:  Pristiq  100mg  PO daily - Depression ; equivalent selected as ordered of Effexor  XR 150 mg qdaily and patient will switch back outpateint Levothryoxine 150mcg PO daily - Hypothyroidism    Hypothyroidism: restarted levothyroxine   Epilepsy: oriented x 4 but does not remember her AED regimen, I was able to verify she was taking Depakote  500 mg TID which was switched to Lamotrigine  100 mg BID, patient has not taken in setting of alcohol intoxication, discussed r/b/a and patient would like to restart Depakote  500 mg BID and increase to 500 mg TID as tolerated and then will followup with neurology,  due to risk of Steven's johnson syndrome with having missed doses and restarting at 100 mg BID     Imojean Yoshino, MD 01/02/2024 5:53 PM

## 2024-01-02 NOTE — Group Note (Signed)
 Group Topic: Positive Affirmations  Group Date: 01/01/2024 Start Time: 2000 End Time: 2110 Facilitators: Lenon Lenice SAUNDERS, NT  Department: [facility]  Number of Participants: 7  Group Focus: affirmation Treatment Modality:  Individual Therapy Interventions utilized were support Purpose: express feelings  Name: Lisa [name] Date of Birth: [birth year]  MR: [id number]    Level of Participation: active Quality of Participation: engaged Interactions with others: gave feedback Mood/Affect: appropriate Triggers (if applicable):  Cognition: coherent/clear Progress: Moderate Response:  Plan: follow-up needed  Patients Problems:  Patient Active Problem List   Diagnosis Date Noted   Alcohol use disorder, moderate, dependence (HCC) 12/31/2023   Pelvic pain 06/22/2021   S/P laparoscopic hysterectomy 06/22/2021   Elevated LFTs 02/16/2021   Right foot pain 02/15/2021   Smoker 11/02/2020   Pulmonary emphysema (HCC) 03/15/2020   Iron  deficiency anemia 05/01/2017   Medication management 01/24/2016   Vitamin D  deficiency 12/13/2014   Hyperlipidemia LDL goal <70 12/13/2014   BMI 23.0-23.9, adult 09/07/2014   Anxiety    Dissociative identity disorder (HCC) 10/19/2011   History of gastric bypass (2012) 10/11/2011   Seizure disorder (HCC) 10/11/2011   Type 2 diabetes mellitus with hyperlipidemia (HCC) 10/11/2011   Hypothyroidism 10/11/2011   Panic attacks 10/11/2011   PTSD (post-traumatic stress disorder) 09/20/2011   Depression 08/17/2011

## 2024-01-02 NOTE — Group Note (Signed)
 Group Topic: Social Support  Group Date: 01/02/2024 Start Time: 1705 End Time: 1715 Facilitators: Celinda Suzen NOVAK, NT  Department: [facility]  Number of Participants: 10  Group Focus: relapse prevention and self-esteem Treatment Modality:  Psychoeducation Interventions utilized were support Purpose: increase insight and regain self-worth  Name: Lisa [name] Date of Birth: 14-Dec-1975  MR: [id number]    Level of Participation: moderate Quality of Participation: cooperative Interactions with others: gave feedback Mood/Affect: appropriate Triggers (if applicable): n/a Cognition: coherent/clear Progress: Gaining insight Response: PT stated she wasn't feeling too well today, ready to leave Plan: patient will be encouraged to attend group  Patients Problems:  Patient Active Problem List   Diagnosis Date Noted   Alcohol use disorder, moderate, dependence (HCC) 12/31/2023   Pelvic pain 06/22/2021   S/P laparoscopic hysterectomy 06/22/2021   Elevated LFTs 02/16/2021   Right foot pain 02/15/2021   Smoker 11/02/2020   Pulmonary emphysema (HCC) 03/15/2020   Iron  deficiency anemia 05/01/2017   Medication management 01/24/2016   Vitamin D  deficiency 12/13/2014   Hyperlipidemia LDL goal <70 12/13/2014   BMI 23.0-23.9, adult 09/07/2014   Anxiety    Dissociative identity disorder (HCC) 10/19/2011   History of gastric bypass (2012) 10/11/2011   Seizure disorder (HCC) 10/11/2011   Type 2 diabetes mellitus with hyperlipidemia (HCC) 10/11/2011   Hypothyroidism 10/11/2011   Panic attacks 10/11/2011   PTSD (post-traumatic stress disorder) 09/20/2011   Depression 08/17/2011

## 2024-01-02 NOTE — Group Note (Signed)
 Group Topic: Change and Accountability  Group Date: 01/02/2024 Start Time: 2100 End Time: 2130 Facilitators: Joan Plowman B  Department: [facility]  Number of Participants: 5  Group Focus: abuse issues, check in, chemical dependency education, chemical dependency issues, clarity of thought, communication, community group, coping skills, daily focus, depression, and goals/reality orientation Treatment Modality:  Individual Therapy and Leisure Development Interventions utilized were leisure development and support Purpose: express feelings  Name: Lisa [name] Date of Birth: September 05, 1975  MR: [id number]    Level of Participation: PT DID NOT GO TO GROUPS Quality of Participation: cooperative Interactions with others: gave feedback Mood/Affect: appropriate Triggers (if applicable): NA Cognition: coherent/clear Progress: None Response: NA Plan: patient will be encouraged to go to groups.  Patients Problems:  Patient Active Problem List   Diagnosis Date Noted   Alcohol use disorder, moderate, dependence (HCC) 12/31/2023   Pelvic pain 06/22/2021   S/P laparoscopic hysterectomy 06/22/2021   Elevated LFTs 02/16/2021   Right foot pain 02/15/2021   Smoker 11/02/2020   Pulmonary emphysema (HCC) 03/15/2020   Iron  deficiency anemia 05/01/2017   Medication management 01/24/2016   Vitamin D  deficiency 12/13/2014   Hyperlipidemia LDL goal <70 12/13/2014   BMI 23.0-23.9, adult 09/07/2014   Anxiety    Dissociative identity disorder (HCC) 10/19/2011   History of gastric bypass (2012) 10/11/2011   Seizure disorder (HCC) 10/11/2011   Type 2 diabetes mellitus with hyperlipidemia (HCC) 10/11/2011   Hypothyroidism 10/11/2011   Panic attacks 10/11/2011   PTSD (post-traumatic stress disorder) 09/20/2011   Depression 08/17/2011

## 2024-01-02 NOTE — ED Notes (Signed)
 Patient observed/assessed at bedside lying in bed asleep. Patient alert and oriented to self and location. Patient reports having body aches,cold and hot chills. He denies A/V/H. He denies having any thoughts/plan of self harm and harm towards others.   Verbalizes no further complaints at this time.

## 2024-01-02 NOTE — Group Note (Signed)
 Group Topic: Recovery Basics  Group Date: 01/02/2024 Start Time: 1215 End Time: 1245 Facilitators: Carletha Iha, RN  Department: [facility]  Number of Participants: 10  Group Focus: nursing group Treatment Modality:  Psychoeducation Interventions utilized were patient education Purpose: increase insight  Name: Lisa [name] Date of Birth: 1976-01-19  MR: [id number]    Level of Participation: active Quality of Participation: initiates communication Interactions with others: gave feedback Mood/Affect: appropriate Triggers (if applicable):  Cognition: goal directed Progress: Gaining insight Response:  Plan  patient will be encouraged to identify triggers and create plan for sobriety   Patients Problems:  Patient Active Problem List   Diagnosis Date Noted   Alcohol use disorder, moderate, dependence (HCC) 12/31/2023   Pelvic pain 06/22/2021   S/P laparoscopic hysterectomy 06/22/2021   Elevated LFTs 02/16/2021   Right foot pain 02/15/2021   Smoker 11/02/2020   Pulmonary emphysema (HCC) 03/15/2020   Iron  deficiency anemia 05/01/2017   Medication management 01/24/2016   Vitamin D  deficiency 12/13/2014   Hyperlipidemia LDL goal <70 12/13/2014   BMI 23.0-23.9, adult 09/07/2014   Anxiety    Dissociative identity disorder (HCC) 10/19/2011   History of gastric bypass (2012) 10/11/2011   Seizure disorder (HCC) 10/11/2011   Type 2 diabetes mellitus with hyperlipidemia (HCC) 10/11/2011   Hypothyroidism 10/11/2011   Panic attacks 10/11/2011   PTSD (post-traumatic stress disorder) 09/20/2011   Depression 08/17/2011

## 2024-01-02 NOTE — Discharge Instructions (Addendum)
-  Schedule followup with neurology regarding which antiepileptic drug you should continue moving forward. -I Ordered outpatient labs for Depakote  level, CBC, CMP to be completed at 8 AM in the morning before taking your morning Depakote  dose. Followup and discuss lab results with PCP or neurologist (during your admission lab results were appropriate to continue taking Depakote ) -Discuss liver function with PCP and neurology and if they recommend continuation of Depakote  or switching to an alternative AED.  You are scheduled for your follow up SAIOP assessment upstairs on the second floor on Thursday, July 17 @ 10:00 AM to arrive at 9:30.    Non-Emergent / Urgent   [facility] [address] SECOND FLOOR [place], [place] [address] [phone] OUTPATIENT Walk-in information: Please note, all walk-ins are first come & first serve, with limited number of availability.     Please note that to be eligible for services you must bring: ID or a piece of mail with your name [facility] address   Therapist for therapy:  Monday & Wednesdays: Please ARRIVE at 7:15 AM for registration Will START at 8:00 AM Every 1st & 2nd Friday of the month: Please ARRIVE at 10:15 AM for registration Will START at 1 PM - 5 PM   Psychiatrist for medication management: Monday - Friday:  Please ARRIVE at 7:15 AM for registration Will START at 8:00 AM

## 2024-01-02 NOTE — ED Notes (Signed)
 Pt presented in dayroom, calm upon approach.  Pt reports feeling some better from yesterday.  Reports feeling less sleepy.  Denied current SI plan and intent, Q 15 minute observations for safety continue

## 2024-01-03 DIAGNOSIS — F10230 Alcohol dependence with withdrawal, uncomplicated: Secondary | ICD-10-CM | POA: Diagnosis not present

## 2024-01-03 LAB — COMPREHENSIVE METABOLIC PANEL WITH GFR
ALT: 67 U/L — ABNORMAL HIGH (ref 0–44)
AST: 62 U/L — ABNORMAL HIGH (ref 15–41)
Albumin: 3.8 g/dL (ref 3.5–5.0)
Alkaline Phosphatase: 72 U/L (ref 38–126)
Anion gap: 9 (ref 5–15)
BUN: 8 mg/dL (ref 6–20)
CO2: 26 mmol/L (ref 22–32)
Calcium: 9.5 mg/dL (ref 8.9–10.3)
Chloride: 103 mmol/L (ref 98–111)
Creatinine, Ser: 0.67 mg/dL (ref 0.44–1.00)
GFR, Estimated: >60 mL/min (ref >60)
Glucose, Bld: 132 mg/dL — ABNORMAL HIGH (ref 70–99)
Potassium: 4.9 mmol/L (ref 3.5–5.1)
Sodium: 138 mmol/L (ref 135–145)
Total Bilirubin: 1 mg/dL (ref 0.0–1.2)
Total Protein: 7.1 g/dL (ref 6.5–8.1)

## 2024-01-03 LAB — CBC WITH DIFFERENTIAL/PLATELET
Abs Immature Granulocytes: 0.03 K/uL (ref 0.00–0.07)
Basophils Absolute: 0.1 K/uL (ref 0.0–0.1)
Basophils Relative: 1 %
Eosinophils Absolute: 0.2 K/uL (ref 0.0–0.5)
Eosinophils Relative: 5 %
HCT: 40.4 % (ref 36.0–46.0)
Hemoglobin: 13.7 g/dL (ref 12.0–15.0)
Immature Granulocytes: 1 %
Lymphocytes Relative: 31 %
Lymphs Abs: 1.6 K/uL (ref 0.7–4.0)
MCH: 36 pg — ABNORMAL HIGH (ref 26.0–34.0)
MCHC: 33.9 g/dL (ref 30.0–36.0)
MCV: 106 fL — ABNORMAL HIGH (ref 80.0–100.0)
Monocytes Absolute: 0.6 K/uL (ref 0.1–1.0)
Monocytes Relative: 11 %
Neutro Abs: 2.8 K/uL (ref 1.7–7.7)
Neutrophils Relative %: 51 %
Platelets: 236 K/uL (ref 150–400)
RBC: 3.81 MIL/uL — ABNORMAL LOW (ref 3.87–5.11)
RDW: 12.1 % (ref 11.5–15.5)
WBC: 5.3 K/uL (ref 4.0–10.5)
nRBC: 0 % (ref 0.0–0.2)

## 2024-01-03 LAB — VALPROIC ACID LEVEL: Valproic Acid Lvl: 50 ug/mL (ref 50–100)

## 2024-01-03 MED ORDER — DIVALPROEX SODIUM 500 MG PO DR TAB
750.0000 mg | DELAYED_RELEASE_TABLET | Freq: Two times a day (BID) | ORAL | Status: DC
  Administered 2024-01-03 – 2024-01-04 (×2): 750 mg via ORAL
  Filled 2024-01-03 (×2): qty 1

## 2024-01-03 MED ORDER — NICOTINE POLACRILEX 2 MG MT GUM
2.0000 mg | CHEWING_GUM | OROMUCOSAL | Status: DC | PRN
  Administered 2024-01-03: 2 mg via ORAL
  Filled 2024-01-03: qty 1

## 2024-01-03 MED ORDER — ACAMPROSATE CALCIUM 333 MG PO TBEC
666.0000 mg | DELAYED_RELEASE_TABLET | Freq: Three times a day (TID) | ORAL | Status: DC
  Administered 2024-01-03 – 2024-01-04 (×4): 666 mg via ORAL
  Filled 2024-01-03 (×4): qty 2

## 2024-01-03 NOTE — [facility] Group Notes (Signed)
 SPIRITUALITY GROUP NOTE  Spirituality group facilitated by Chaplain Glennie Rodda, MDiv, [facility].  Group Description: Group focused on topic of hope. Patients participated in facilitated discussion around topic, connecting with one another around experiences and definitions for hope. Group members engaged with visual explorer photos, reflecting on what hope looks like for them today. Group engaged in discussion around how their definitions of hope are present today in hospital.  Modalities: Psycho-social ed, Adlerian, Narrative, MI  Patient Progress: Present throughout group.  Engaged with facilitator and peers in group discussion.  Comments appropriate to topic.

## 2024-01-03 NOTE — Group Note (Signed)
 Group Topic: Relapse and Recovery  Group Date: 01/03/2024 Start Time: 2000 End Time: 2100 Facilitators: Joan Plowman B  Department: [facility]  Number of Participants: 5  Group Focus: abuse issues, chemical dependency issues, and goals/reality orientation Treatment Modality:  Leisure Development and Psychoeducation Interventions utilized were group exercise, patient education, and support Purpose: enhance coping skills and express feelings  Name: NEIVA MAENZA Date of Birth: 1975/07/23  MR: [id number]    Level of Participation: PT DID NOT ATTEND GROUP Quality of Participation: cooperative Interactions with others: gave feedback Mood/Affect: appropriate Triggers (if applicable): NA Cognition: coherent/clear Progress: None Response: NA Plan: patient will be encouraged to go to groups.   Patients Problems:  Patient Active Problem List   Diagnosis Date Noted   Alcohol use disorder, moderate, dependence (HCC) 12/31/2023   Pelvic pain 06/22/2021   S/P laparoscopic hysterectomy 06/22/2021   Elevated LFTs 02/16/2021   Right foot pain 02/15/2021   Smoker 11/02/2020   Pulmonary emphysema (HCC) 03/15/2020   Iron  deficiency anemia 05/01/2017   Medication management 01/24/2016   Vitamin D  deficiency 12/13/2014   Hyperlipidemia LDL goal <70 12/13/2014   BMI 23.0-23.9, adult 09/07/2014   Anxiety    Dissociative identity disorder (HCC) 10/19/2011   History of gastric bypass (2012) 10/11/2011   Seizure disorder (HCC) 10/11/2011   Type 2 diabetes mellitus with hyperlipidemia (HCC) 10/11/2011   Hypothyroidism 10/11/2011   Panic attacks 10/11/2011   PTSD (post-traumatic stress disorder) 09/20/2011   Depression 08/17/2011

## 2024-01-03 NOTE — Group Note (Signed)
 Group Topic: Recovery Basics  Group Date: 01/03/2024 Start Time: 1205 End Time: 1300 Facilitators: Stanly Stabile, RN  Department: [facility]  Number of Participants: 13  Group Focus: chemical dependency education, chemical dependency issues, coping skills, and dual diagnosis Treatment Modality:  Behavior Modification Therapy Interventions utilized were clarification, confrontation, exploration, patient education, problem solving, and reality testing Purpose: enhance coping skills, explore maladaptive thinking, express feelings, express irrational fears, improve communication skills, increase insight, regain self-worth, reinforce self-care, relapse prevention strategies, and trigger / craving management  Name: Lisa [name] Date of Birth: [birth year]  MR: [id number]    Level of Participation: active Quality of Participation: attentive and cooperative Interactions with others: gave feedback Mood/Affect: appropriate Triggers (if applicable):   Cognition: coherent/clear and goal directed Progress: Gaining insight Response:   Plan: follow-up needed  Patients Problems:  Patient Active Problem List   Diagnosis Date Noted   Alcohol use disorder, moderate, dependence (HCC) 12/31/2023   Pelvic pain 06/22/2021   S/P laparoscopic hysterectomy 06/22/2021   Elevated LFTs 02/16/2021   Right foot pain 02/15/2021   Smoker 11/02/2020   Pulmonary emphysema (HCC) 03/15/2020   Iron  deficiency anemia 05/01/2017   Medication management 01/24/2016   Vitamin D  deficiency 12/13/2014   Hyperlipidemia LDL goal <70 12/13/2014   BMI 23.0-23.9, adult 09/07/2014   Anxiety    Dissociative identity disorder (HCC) 10/19/2011   History of gastric bypass (2012) 10/11/2011   Seizure disorder (HCC) 10/11/2011   Type 2 diabetes mellitus with hyperlipidemia (HCC) 10/11/2011   Hypothyroidism 10/11/2011   Panic attacks 10/11/2011   PTSD (post-traumatic stress disorder)  09/20/2011   Depression 08/17/2011

## 2024-01-03 NOTE — ED Notes (Signed)
 Pt sitting in dayroom watching television and interacting with peers. No acute distress noted. No concerns voiced. Informed pt to notify staff with any needs or assistance. Pt verbalized understanding and agreement. Will continue to monitor for safety.

## 2024-01-03 NOTE — ED Notes (Signed)
 Patient A&Ox4. Denies intent to harm self/others when asked. Denies A/VH. Patient denies any physical complaints when asked. No acute distress noted. Support and encouragement provided. Routine safety checks conducted according to facility protocol. Encouraged patient to notify staff if thoughts of harm toward self or others arise. Patient verbalize understanding and agreement. Will continue to monitor for safety.

## 2024-01-03 NOTE — ED Notes (Signed)
 Patient has substance abuse disorder. She sleeps well and eats adequate. She reported having pain rated 5/10, location head/headache. Denied suicidal ideation. Denied N/V and no sweating. She is alert and oriented to time, place, person and situation. BM remains as usual. Med compliant.

## 2024-01-03 NOTE — Group Note (Signed)
 Group Topic: Recovery Basics  Group Date: 01/03/2024 Start Time: 1000 End Time: 1045 Facilitators: Judi Monico RAMAN, NT  Department: [facility]  Number of Participants: 6  Group Focus: other peer support Treatment Modality:  Psychoeducation Interventions utilized were support Purpose: enhance coping skills, explore maladaptive thinking, express feelings, express irrational fears, improve communication skills, increase insight, regain self-worth, and reinforce self-care  Name: BENICIA BERGEVIN Date of Birth: 22-Jul-1975  MR: [id number]    Level of Participation: Pt did not attend  Patients Problems:  Patient Active Problem List   Diagnosis Date Noted   Alcohol use disorder, moderate, dependence (HCC) 12/31/2023   Pelvic pain 06/22/2021   S/P laparoscopic hysterectomy 06/22/2021   Elevated LFTs 02/16/2021   Right foot pain 02/15/2021   Smoker 11/02/2020   Pulmonary emphysema (HCC) 03/15/2020   Iron  deficiency anemia 05/01/2017   Medication management 01/24/2016   Vitamin D  deficiency 12/13/2014   Hyperlipidemia LDL goal <70 12/13/2014   BMI 23.0-23.9, adult 09/07/2014   Anxiety    Dissociative identity disorder (HCC) 10/19/2011   History of gastric bypass (2012) 10/11/2011   Seizure disorder (HCC) 10/11/2011   Type 2 diabetes mellitus with hyperlipidemia (HCC) 10/11/2011   Hypothyroidism 10/11/2011   Panic attacks 10/11/2011   PTSD (post-traumatic stress disorder) 09/20/2011   Depression 08/17/2011

## 2024-01-03 NOTE — ED Provider Notes (Signed)
 Behavioral Health Progress Note  Date and Time: 01/03/2024 10:35 AM Name: Lisa [name] MRN:  [id number]  Subjective:  Patient reports good sleep and appetite. States she feels tired. Denies significant depression or anxiety and is future oriented on discharge tomorrow and SA-IOP followup. Tolerating restarting Depakote  for epilepsy and denies any side effects. Patient denies depression or anxiety. Denies SI, HI or AVH. Denies side effects from medications. Discussed starting acamprosate  for alcohol cessation and she was agreeable.   Diagnosis:  Final diagnoses:  MDD (major depressive disorder), recurrent severe, without psychosis (HCC)  GAD (generalized anxiety disorder)  Alcohol dependence with uncomplicated withdrawal (HCC)    Total Time spent with patient: 30 minutes  Past Psychiatric History:  Outpatient: Wilbert Collet who is her therapist and Dr. Vincente is her psychiatrist Medications: she is prescribed Adderall, Valium  5 mg BID, and Prisiq. 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   Depression     Diabetes mellitus without complication Type 2 diet controlled     Epilepsy (HCC)      dx age 36   Habitual abortion history, antepartum     Headache(784.0)     History of diabetes mellitus type 2      prior to gastric bypass in 2012   history of migraine      none since dec 2022   History of recurrent miscarriages, not currently pregnant 10/11/2011   History of sleep apnea      no osa sonce 2013 weight  loss after gastric bypass   Hypothyroidism     Infertility associated with anovulation     Obesity      BMi 33   Personal history of COVID-19 07/2020    fever, migraine, extreme lethargy x  1 week all symptoms resolved   Seasonal allergies 08/09/2021   Seizures (HCC)      spring 2022 twitching of right hand only   UTI (lower urinary tract infection) 06/2021    after 05-2022 surgery        Family History: denies Social History: patient lives with her parents  Additional Social History:                         Sleep: Fair  Appetite:  Fair  Current Medications:  Current Facility-Administered Medications  Medication Dose Route Frequency Provider Last Rate Last Admin   acamprosate  (CAMPRAL ) tablet 666 mg  666 mg Oral TID Thu Baggett, MD       alum & mag hydroxide-simeth (MAALOX/MYLANTA) 200-200-20 MG/5ML suspension 30 mL  30 mL Oral Q4H PRN Olasunkanmi, Oluwatosin, NP       haloperidol  (HALDOL ) tablet 5 mg  5 mg Oral TID PRN Olasunkanmi, Oluwatosin, NP       And   diphenhydrAMINE  (BENADRYL ) capsule 50 mg  50 mg Oral TID PRN Olasunkanmi, Oluwatosin, NP       haloperidol  lactate (HALDOL ) injection 5 mg  5 mg Intramuscular TID PRN Olasunkanmi, Oluwatosin, NP       And   diphenhydrAMINE  (BENADRYL ) injection 50 mg  50 mg Intramuscular TID PRN Olasunkanmi, Oluwatosin, NP       And   LORazepam  (ATIVAN ) injection 2 mg  2 mg Intramuscular TID PRN Olasunkanmi, Oluwatosin, NP       haloperidol  lactate (HALDOL ) injection 10 mg  10 mg Intramuscular TID PRN Olasunkanmi, Oluwatosin, NP       And   diphenhydrAMINE  (BENADRYL ) injection 50 mg  50 mg Intramuscular TID PRN Olasunkanmi, Oluwatosin, NP       And   LORazepam  (ATIVAN ) injection 2 mg  2 mg Intramuscular TID PRN Olasunkanmi, Oluwatosin, NP       divalproex  (DEPAKOTE ) DR tablet 500 mg  500 mg Oral Q12H Tavita Eastham, MD   500 mg at 01/03/24 0946   fluticasone  (FLONASE ) 50 MCG/ACT nasal spray 2 spray  2 spray Each Nare Daily  PRN Dejanay Wamboldt, MD       hydrOXYzine  (ATARAX ) tablet 25 mg  25 mg Oral Q6H PRN Olasunkanmi, Oluwatosin, NP   25 mg at 01/02/24 1725   ibuprofen  (ADVIL ) tablet 400 mg  400 mg Oral Q6H PRN Trenton Verne, MD   400 mg at 01/03/24 0902   levothyroxine  (SYNTHROID ) tablet 150 mcg  150 mcg Oral Q0600 Olasunkanmi, Oluwatosin, NP   150 mcg at 01/03/24 0553   loperamide  (IMODIUM ) capsule 2-4 mg  2-4 mg Oral PRN Olasunkanmi, Oluwatosin, NP   4 mg at 01/01/24 9096   loratadine  (CLARITIN ) tablet 10 mg  10 mg Oral Daily PRN Merci Walthers, MD       LORazepam  (ATIVAN ) tablet 1 mg  1 mg Oral BID Avonlea Sima, MD   1 mg at 01/03/24 0946   Followed by   NOREEN ON 01/04/2024] LORazepam  (ATIVAN ) tablet 1 mg  1 mg Oral Daily Antwanette Wesche, MD       magnesium  hydroxide (MILK OF MAGNESIA) suspension 30 mL  30 mL Oral Daily PRN Olasunkanmi, Oluwatosin, NP       nicotine  (NICODERM CQ  - dosed in mg/24 hours) patch 21 mg  21 mg Transdermal Q0600 Olasunkanmi, Oluwatosin, NP   21 mg at 01/03/24 0901   ondansetron  (ZOFRAN -ODT) disintegrating tablet 4 mg  4 mg Oral Q6H PRN Olasunkanmi, Oluwatosin, NP   4 mg at 01/02/24 9092   pantoprazole  (PROTONIX ) EC tablet 80 mg  80 mg Oral Daily Issa Kosmicki, MD   80 mg at 01/03/24 0946   thiamine  (VITAMIN B1) tablet 100 mg  100 mg Oral Daily Olasunkanmi, Oluwatosin, NP   100 mg at 01/03/24 0946   traZODone  (DESYREL ) tablet 50 mg  50 mg Oral QHS PRN Bibi Economos, MD   50 mg at 01/02/24 2156   venlafaxine  XR (EFFEXOR -XR) 24 hr capsule 150 mg  150 mg Oral Q breakfast Olasunkanmi, Oluwatosin, NP   150 mg at 01/03/24 0902   Current Outpatient Medications  Medication Sig Dispense Refill   amphetamine-dextroamphetamine (ADDERALL XR) 10 MG 24 hr capsule Take 10 mg by mouth daily at 12 noon.     amphetamine-dextroamphetamine (ADDERALL XR) 20 MG 24 hr capsule Take 20 mg by mouth every morning.     desvenlafaxine  (PRISTIQ ) 100 MG 24 hr tablet Take 100 mg by mouth daily.     diazepam  (VALIUM ) 5  MG tablet Take 5 mg by mouth 2 (two) times daily as needed for anxiety.     fluticasone  (FLONASE ) 50 MCG/ACT nasal spray Place  2 sprays into both nostrils daily as needed for allergies.     Levothyroxine  Sodium 150 MCG CAPS Take  1 tablet  Daily  on an empty stomach with only water for 30 minutes & no Antacid meds, Calcium  or Magnesium  for 4 hours & avoid Biotin                                                                                                                                  /                                                                                 TAKE                                                         BY                                                        MOUTH (Patient taking differently: Take 150 mcg by mouth daily. Take on an empty stomach with only water for 30 minutes & no Antacid meds, Calcium  or Magnesium  for 4 hours & avoid Biotin) 90 capsule 3   loratadine  (CLARITIN ) 10 MG tablet Take 10 mg by mouth daily as needed for allergies.     naproxen sodium (ALEVE) 220 MG tablet Take 440 mg by mouth 2 (two) times daily as needed (For pain).     traZODone  (DESYREL ) 50 MG tablet Take 1 tablet at Bedtime for Sleep (Patient taking differently: Take 50 mg by mouth at bedtime as needed for sleep.) 90 tablet 3   Esketamine HCl, 84 MG Dose, (SPRAVATO, 84 MG DOSE,) 28 MG/DEVICE SOPK Place 1 spray into the nose as needed (For depression). (Patient not taking: Reported on 01/01/2024)      Labs  Lab Results:  Admission on 12/31/2023  Component Date Value Ref Range Status   WBC 01/01/2024 6.1  4.0 - 10.5 K/uL Final   RBC 01/01/2024 3.75 (L)  3.87 - 5.11 MIL/uL Final   Hemoglobin 01/01/2024 13.2  12.0 - 15.0 g/dL Final   HCT 92/90/7974 38.8  36.0 - 46.0 % Final   MCV 01/01/2024 103.5 (H)  80.0 - 100.0 fL Final   MCH 01/01/2024 35.2 (H)  26.0 - 34.0 pg  Final   MCHC 01/01/2024 34.0  30.0 - 36.0 g/dL Final   RDW 92/90/7974 12.1  11.5 - 15.5 % Final   Platelets 01/01/2024 213  150 -  400 K/uL Final   nRBC 01/01/2024 0.0  0.0 - 0.2 % Final   Neutrophils Relative % 01/01/2024 48  % Final   Neutro Abs 01/01/2024 2.9  1.7 - 7.7 K/uL Final   Lymphocytes Relative 01/01/2024 34  % Final   Lymphs Abs 01/01/2024 2.1  0.7 - 4.0 K/uL Final   Monocytes Relative 01/01/2024 12  % Final   Monocytes Absolute 01/01/2024 0.7  0.1 - 1.0 K/uL Final   Eosinophils Relative 01/01/2024 5  % Final   Eosinophils Absolute 01/01/2024 0.3  0.0 - 0.5 K/uL Final   Basophils Relative 01/01/2024 1  % Final   Basophils Absolute 01/01/2024 0.1  0.0 - 0.1 K/uL Final   Immature Granulocytes 01/01/2024 0  % Final   Abs Immature Granulocytes 01/01/2024 0.02  0.00 - 0.07 K/uL Final   Performed at [facility], [address], [place], [place] [address]   Sodium 01/01/2024 139  135 - 145 mmol/L Final   Potassium 01/01/2024 3.5  3.5 - 5.1 mmol/L Final   Chloride 01/01/2024 100  98 - 111 mmol/L Final   CO2 01/01/2024 25  22 - 32 mmol/L Final   Glucose, Bld 01/01/2024 106 (H)  70 - 99 mg/dL Final   Glucose reference range applies only to samples taken after fasting for at least 8 hours.   BUN 01/01/2024 11  6 - 20 mg/dL Final   Creatinine, Ser 01/01/2024 0.74  0.44 - 1.00 mg/dL Final   Calcium  01/01/2024 9.7  8.9 - 10.3 mg/dL Final   Total Protein 92/90/7974 7.0  6.5 - 8.1 g/dL Final   Albumin 92/90/7974 4.1  3.5 - 5.0 g/dL Final   AST 92/90/7974 139 (H)  15 - 41 U/L Final   ALT 01/01/2024 90 (H)  0 - 44 U/L Final   Alkaline Phosphatase 01/01/2024 93  38 - 126 U/L Final   Total Bilirubin 01/01/2024 1.7 (H)  0.0 - 1.2 mg/dL Final   GFR, Estimated 01/01/2024 >60  >60 mL/min Final   Comment: (NOTE) Calculated using the CKD-EPI Creatinine Equation (2021)    Anion gap 01/01/2024 14  5 - 15 Final   Performed at [facility], [address] [place], [place] [address]   Hgb A1c MFr Bld 01/01/2024 5.1  4.8 - 5.6 % Final   Comment: (NOTE)         Prediabetes: 5.7 - 6.4         Diabetes: >6.4          Glycemic control for adults with diabetes: <7.0    Mean Plasma Glucose 01/01/2024 100  mg/dL Final   Comment: (NOTE) Performed At: [facility] [address] [place], [place] [id number] Jennette Shorter MD Ey:[id number]    Magnesium  01/01/2024 2.0  1.7 - 2.4 mg/dL Final   Performed at [facility], [address] [place], [place] [address]   Alcohol, Ethyl (B) 01/01/2024 <15  <15 mg/dL Final   Comment: (NOTE) For medical purposes only. Performed at [facility], [address] [place], [place] [address]    Cholesterol 01/01/2024 225 (H)  0 - 200 mg/dL Final   Triglycerides 92/90/7974 51  <150 mg/dL Final   HDL 92/90/7974 122  >40 mg/dL Final   Total CHOL/HDL Ratio 01/01/2024 1.8  RATIO Final   VLDL 01/01/2024 10  0 - 40 mg/dL Final   LDL Cholesterol 01/01/2024 93  0 - 99 mg/dL Final   Comment:        Total Cholesterol/HDL:CHD Risk Coronary Heart Disease Risk Table                     Men   Women  1/2 Average Risk   3.4   3.3  Average Risk       5.0   4.4  2 X Average Risk   9.6   7.1  3 X Average Risk  23.4   11.0        Use the calculated Patient Ratio above and the CHD Risk Table to determine the patient's CHD Risk.        ATP III CLASSIFICATION (LDL):  <100     mg/dL   Optimal  899-870  mg/dL   Near or Above                    Optimal  130-159  mg/dL   Borderline  839-810  mg/dL   High  >809     mg/dL   Very High Performed at [facility], [address] [place], [place] [address]    TSH 01/01/2024 30.258 (H)  0.350 - 4.500 uIU/mL Final   Comment: Performed by a 3rd Generation assay with a functional sensitivity of <=0.01 uIU/mL. Performed at [facility], [address], [place], [place] [address]    Valproic  Acid Lvl 01/01/2024 <10 (L)  50 - 100 ug/mL Final   Comment: RESULT CONFIRMED BY MANUAL DILUTION Performed at [facility], [address], [place], [place] [address]    Valproic  Acid Lvl 01/03/2024 50  50 - 100  ug/mL Final   Performed at [facility], [address], [place], [place] [address]   WBC 01/03/2024 5.3  4.0 - 10.5 K/uL Final   RBC 01/03/2024 3.81 (L)  3.87 - 5.11 MIL/uL Final   Hemoglobin 01/03/2024 13.7  12.0 - 15.0 g/dL Final   HCT 92/88/7974 40.4  36.0 - 46.0 % Final   MCV 01/03/2024 106.0 (H)  80.0 - 100.0 fL Final   MCH 01/03/2024 36.0 (H)  26.0 - 34.0 pg Final   MCHC 01/03/2024 33.9  30.0 - 36.0 g/dL Final   RDW 92/88/7974 12.1  11.5 - 15.5 % Final   Platelets 01/03/2024 236  150 - 400 K/uL Final   nRBC 01/03/2024 0.0  0.0 - 0.2 % Final   Neutrophils Relative % 01/03/2024 51  % Final   Neutro Abs 01/03/2024 2.8  1.7 - 7.7 K/uL Final   Lymphocytes Relative 01/03/2024 31  % Final   Lymphs Abs 01/03/2024 1.6  0.7 - 4.0 K/uL Final   Monocytes Relative 01/03/2024 11  % Final   Monocytes Absolute 01/03/2024 0.6  0.1 - 1.0 K/uL Final   Eosinophils Relative 01/03/2024 5  % Final   Eosinophils Absolute 01/03/2024 0.2  0.0 - 0.5 K/uL Final   Basophils Relative 01/03/2024 1  % Final   Basophils Absolute 01/03/2024 0.1  0.0 - 0.1 K/uL Final   Immature Granulocytes 01/03/2024 1  % Final   Abs Immature Granulocytes 01/03/2024 0.03  0.00 - 0.07 K/uL Final   Performed at [facility], [address], [place], [place] [address]   Sodium 01/03/2024 138  135 - 145 mmol/L Final  Potassium 01/03/2024 4.9  3.5 - 5.1 mmol/L Final   DELTA CHECK NOTED   Chloride 01/03/2024 103  98 - 111 mmol/L Final   CO2 01/03/2024 26  22 - 32 mmol/L Final   Glucose, Bld 01/03/2024 132 (H)  70 - 99 mg/dL Final   Glucose reference range applies only to samples taken after fasting for at least 8 hours.   BUN 01/03/2024 8  6 - 20 mg/dL Final   Creatinine, Ser 01/03/2024 0.67  0.44 - 1.00 mg/dL Final   Calcium  01/03/2024 9.5  8.9 - 10.3 mg/dL Final   Total Protein 92/88/7974 7.1  6.5 - 8.1 g/dL Final   Albumin 92/88/7974 3.8  3.5 - 5.0 g/dL Final   AST 92/88/7974 62 (H)  15 - 41 U/L Final   ALT  01/03/2024 67 (H)  0 - 44 U/L Final   Alkaline Phosphatase 01/03/2024 72  38 - 126 U/L Final   Total Bilirubin 01/03/2024 1.0  0.0 - 1.2 mg/dL Final   GFR, Estimated 01/03/2024 >60  >60 mL/min Final   Comment: (NOTE) Calculated using the CKD-EPI Creatinine Equation (2021)    Anion gap 01/03/2024 9  5 - 15 Final   Performed at [facility], [address], [place], [place] [address]  Admission on 12/31/2023, Discharged on 12/31/2023  Component Date Value Ref Range Status   Preg Test, Ur 12/31/2023 Negative  Negative Final   POC Amphetamine UR 12/31/2023 None Detected  NONE DETECTED (Cut Off Level 1000 ng/mL) Final   POC Secobarbital (BAR) 12/31/2023 None Detected  NONE DETECTED (Cut Off Level 300 ng/mL) Final   POC Buprenorphine (BUP) 12/31/2023 None Detected  NONE DETECTED (Cut Off Level 10 ng/mL) Final   POC Oxazepam (BZO) 12/31/2023 Positive (A)  NONE DETECTED (Cut Off Level 300 ng/mL) Final   POC Cocaine UR 12/31/2023 None Detected  NONE DETECTED (Cut Off Level 300 ng/mL) Final   POC Methamphetamine UR 12/31/2023 None Detected  NONE DETECTED (Cut Off Level 1000 ng/mL) Final   POC Morphine 12/31/2023 None Detected  NONE DETECTED (Cut Off Level 300 ng/mL) Final   POC Methadone UR 12/31/2023 None Detected  NONE DETECTED (Cut Off Level 300 ng/mL) Final   POC Oxycodone  UR 12/31/2023 None Detected  NONE DETECTED (Cut Off Level 100 ng/mL) Final   POC Marijuana UR 12/31/2023 None Detected  NONE DETECTED (Cut Off Level 50 ng/mL) Final    Blood Alcohol level:  Lab Results  Component Value Date   [facility] <15 01/01/2024    Metabolic Disorder Labs: Lab Results  Component Value Date   HGBA1C 5.1 01/01/2024   MPG 100 01/01/2024   No results found for: PROLACTIN Lab Results  Component Value Date   CHOL 225 (H) 01/01/2024   TRIG 51 01/01/2024   HDL 122 01/01/2024   CHOLHDL 1.8 01/01/2024   VLDL 10 01/01/2024   LDLCALC 93 01/01/2024   LDLCALC 97 10/31/2022    Therapeutic Lab  Levels: No results found for: LITHIUM Lab Results  Component Value Date   VALPROATE 50 01/03/2024   VALPROATE <10 (L) 01/01/2024   No results found for: CBMZ  Physical Findings   PHQ2-9    Flowsheet Row ED from 12/31/2023 in [facility]  PHQ-2 Total Score 0   Flowsheet Row ED from 12/31/2023 in [facility] Most recent reading at 12/31/2023  6:13 PM ED from 12/31/2023 in [facility] Most recent reading at 12/31/2023  3:10  PM ED from 12/31/2023 in [facility] Most recent reading at 12/31/2023 11:32 AM  C-SSRS RISK CATEGORY No Risk No Risk No Risk     Musculoskeletal  Strength & Muscle Tone: within normal limits Gait & Station: normal Patient leans: N/A  Psychiatric Specialty Exam  Presentation  General Appearance:  Appropriate for Environment  Eye Contact: Fair  Speech: Clear and Coherent  Speech Volume: Normal  Handedness: Right   Mood and Affect  Mood: -- (tired)  Affect: Appropriate   Thought Process  Thought Processes: Goal Directed  Descriptions of Associations:Intact  Orientation:Full (Time, Place and Person)  Thought Content:Logical  Diagnosis of Schizophrenia or Schizoaffective disorder in past: No    Hallucinations:Hallucinations: None  Ideas of Reference:None  Suicidal Thoughts:Suicidal Thoughts: No  Homicidal Thoughts:Homicidal Thoughts: No   Sensorium  Memory: Immediate Fair  Judgment: Fair  Insight: Fair   Art therapist  Concentration: Fair  Attention Span: Fair  Recall: [employer] of Knowledge: Fair  Language: Fair   Psychomotor Activity  Psychomotor Activity: Psychomotor Activity: Normal   Assets  Assets: Communication Skills; Desire for Improvement; Financial Resources/Insurance; Housing; Leisure Time; Physical Health; Resilience; Social Support   Sleep  Sleep: Sleep: Fair   Estimated Sleeping Duration (Last 24 Hours): 11.25-13.00 hours  No data recorded  Physical Exam  Physical exam:  General: Well developed, well nourished, Caucasian female Pupils: Normal at 3mm Respiratory: Breathing is unlabored.  Cardiovascular: No edema.  Language: No anomia, no aphasia Muscle strength and tone-pt moving all extremities.  Gait not assessed as pt remained in bed.  Neuro: Facial muscles are symmetric. Pt without tremor, no evidence of hyperarousal.    Review of Systems  Constitutional: Negative.   HENT: Negative.    Eyes: Negative.   Respiratory: Negative.    Cardiovascular: Negative.   Gastrointestinal: Negative.   Genitourinary: Negative.   Musculoskeletal: Negative.   Skin: Negative.   Neurological: Negative.   Endo/Heme/Allergies: Negative.   Psychiatric/Behavioral:  negative for depression and positive substance abuse. The patient denies feeling nervous/anxious and denies insomnia today   Blood pressure 117/80, pulse 82, temperature 98.6 F (37 C), temperature source Oral, resp. rate 18, last menstrual period 08/23/2020, SpO2 100%. There is no height or weight on file to calculate BMI.  Treatment Plan Summary: Daily contact with patient to assess and evaluate symptoms and progress in treatment and Medication management  01/01/24: Patient presents for alcohol detox and tolerating Ativan  taper well. Denies SI or HI. Presents with symptoms of MDD and GAD with likely worsening in setting of alcohol abuse.  Reports pristiq  was working but she does not have anyone to bring it in. Denies SI or HI. Denies wanting to be dead. Patient appears depressed however declines any medication changes stating she just presented to detox. Will continue ativan  taper.   The patient initially reported that she is taking valproic  acid for her seizure disorder but later stated that it may be Lamictal  instead. She was unable to recall the exact dosage of either medication. A call was  placed to her pharmacy, which confirmed that there have been no records of prescriptions for either valproic  acid or Lamictal  since 2024.   01/02/24:  Depakote  level in AM and repeat CBC, CMP. Patient tolerating all medications well and is euthymic. Denies SI or HI, denies AVH. Completing Ativan  taper due to seizure risk and history of epilepsy.  7/11: Patient tolerating Ativan  taper. Agreeable to starting acamprosate . LFTs are trending down. Discussed need  to track LFTs and followup with her neurologist whether she wants to continue on Depakote  or switch to alternative AED. Patient has been poorly compliant with lamotrigine  thus increasing risk for Steven's Johnson's syndrome and requested to be switched back to Depakote .    #MDD #GAD -cont Effexor  XR 150 mg qdaily as patient does not have Pristiq  with her -Trazodone  50mg  PO at bedtime - Insomnia   #Alcohol use disorder severe with withdrawal -Ativan  taper + CIWA with PRN ATivan   - AST and ALT trending down patient denies any abdominal pain, nausea, vomiting - start acamprosate  666 mg TID   Meds Continued:  Pristiq  100mg  PO daily - Depression ; equivalent selected as ordered of Effexor  XR 150 mg qdaily and patient will switch back outpateint Levothryoxine 150mcg PO daily - Hypothyroidism    Hypothyroidism: restarted levothyroxine   Epilepsy: oriented x 4 but does not remember her AED regimen, I was able to verify she was taking Depakote  500 mg TID which was switched to Lamotrigine  100 mg BID, patient has not taken in setting of alcohol intoxication, discussed r/b/a and patient would like to restart Depakote  500 mg BID and increase to 500 mg TID as tolerated and then will followup with neurology,  due to risk of Steven's johnson syndrome with having missed doses and restarting at 100 mg BID     Kalliope Riesen, MD 01/03/2024 10:35 AM

## 2024-01-03 NOTE — ED Notes (Signed)
 Pt is pleasant. Denies SI/HI/AVH. Pt c/o fatigue. Voiced no other physical complaints. She is safe on the unit at this time with Q 15 min safety checks in place.SABRA

## 2024-01-04 ENCOUNTER — Other Ambulatory Visit: Payer: Self-pay | Admitting: Psychiatry

## 2024-01-04 DIAGNOSIS — F332 Major depressive disorder, recurrent severe without psychotic features: Secondary | ICD-10-CM | POA: Diagnosis not present

## 2024-01-04 DIAGNOSIS — F325 Major depressive disorder, single episode, in full remission: Secondary | ICD-10-CM

## 2024-01-04 DIAGNOSIS — F10230 Alcohol dependence with withdrawal, uncomplicated: Secondary | ICD-10-CM

## 2024-01-04 DIAGNOSIS — F411 Generalized anxiety disorder: Secondary | ICD-10-CM

## 2024-01-04 MED ORDER — NICOTINE 21 MG/24HR TD PT24: 21.0000 mg | MEDICATED_PATCH | Freq: Every day | TRANSDERMAL | 0 refills | Status: DC

## 2024-01-04 MED ORDER — PANTOPRAZOLE SODIUM 40 MG PO TBEC: 80.0000 mg | DELAYED_RELEASE_TABLET | Freq: Every day | ORAL | 0 refills | Status: AC

## 2024-01-04 MED ORDER — TRAZODONE HCL 50 MG PO TABS: 50.0000 mg | ORAL_TABLET | Freq: Every evening | ORAL | 0 refills | Status: AC | PRN

## 2024-01-04 MED ORDER — ACAMPROSATE CALCIUM 333 MG PO TBEC: 666.0000 mg | DELAYED_RELEASE_TABLET | Freq: Three times a day (TID) | ORAL | 0 refills | Status: DC

## 2024-01-04 MED ORDER — DIVALPROEX SODIUM 250 MG PO DR TAB: 750.0000 mg | DELAYED_RELEASE_TABLET | Freq: Two times a day (BID) | ORAL | 0 refills | Status: AC

## 2024-01-04 NOTE — ED Provider Notes (Signed)
 FBC/OBS ASAP Discharge Summary  Date and Time: 01/04/2024 9:54 AM  Name: Lisa [name]  MRN:  [id number]   Discharge Diagnoses:  Final diagnoses:  MDD (major depressive disorder), recurrent severe, without psychosis (HCC)  GAD (generalized anxiety disorder)  Alcohol dependence with uncomplicated withdrawal (HCC)    Subjective: Patient is euthymic and reports good mood. Patient reports good sleep and appetite. States she feels tired. Future oriented on  SA-IOP followup and declines inpatient rehab. Requesting discharge today having completed Ativan  taper. Tolerating restarting Depakote  for epilepsy and denies any side effects. I discussed her obtaining a Depakote  level in 5 days as well as to monitor liver function and platelets and ordered labs at [facility]. Patient instructed on importance of followup with neurology and 750 mg BID was restarted which was the dose patient was previously taking. LFTs trending down from admission and patient is asymptomatic. Tolerating acamprosate  without side effects. . Denies SI, HI or AVH. Denies side effects from medications.   Stay Summary:   Patient was admitted to inpatient psychiatry at [facility] for safety and stabilization. Patient was provided safe and therapeutic milieu, psychiatric and medical assessment, care and treatment, as well as support from nursing, behavioral health staff. Both psychotherapy and psychoeducation groups were provided. Different coping skills such as journaling, CBT and art therapy groups were offered. Additional consultation was provided by hospitalist for H&P and medical needs.  Patient was started on Ativan  taper and Effexor  (due to patient not being able to bring in her Pristiq ) during the admission for MDD. Patient was started on acamprosate  as FDA approved treatment for alcohol dependence. She completed Ativan  taper with minimal withdrawal symptoms and CIWA <5 every day (patient has history of epilepsy). Patient  tolerated without side effects and medications were titrated to therapeutic effect. As patient stabilized on medications and participated in therapeutic interventions, symptoms began to improve. Patient denied SI, HI or AVH throughout admission.  On the day of discharge, the chart was reviewed, case was discussed with staff and the patient was seen in person. Patient's overall mood has improved. Patient was calm and cooperative and did not appear anxious. Patient reported adequate sleep and stable mood. Patient was tolerating medications well without side effects reported or noted. Patient denied suicidal ideation, plan or intent, denied hopelessness, helplessness or worthlessness, and denied homicidal ideation. Insight and judgement have improved. Patient demonstrated future orientation and was motivated to follow-up with aftercare. Patient was encouraged to be adherent with medications. Patient was instructed to call 911, ask for help to go to the closest emergency room or crisis center, call crisis hotlines for help if in critical status or when symptoms were worsening. Patient voiced understanding of this information. At the time of discharge, patient had reached maximum benefit from hospitalization, was no longer considered to be dangerous to self or others, and was psychiatrically stable and otherwise appropriate for discharge to less restrictive care in the community.   Medical Hospital Course:  Medications for chronic conditions were continued. Patient had been noncompliant with antiseizure medications and could not remember doses or how often she is supposed to take them. Could not remember if she was taking Lamotrigine  or Depakote  and had not followed up with neurology for over a year. Patient reported he family thought she had a seizure the Monday before her admission. I emphasized the importance of compliance with AED regimen and not driving for 6 months. Reviewed her previous outpatient neurology  notes and given patient's concerns for  developing steven johnson's syndrome with lamotrigine  and intermittent compliance we discussed r//ba of restarting Depakote  750 mg BID which she had been taking previously. Patient aware of risk for sedation, weight gain, and risk for neural tube defects with pregnancy as well as liver failure and hepatitis. LFTs <2x ULN and downtrending throughout admisison. Patient was asymptomatic. Labs ordered for 5 days after discharge.  Medical hospital course was otherwise unremarkable.    Total Time spent with patient: 30 minutes  Past Psychiatric History:  Outpatient: Wilbert Collet who is her therapist and Dr. Vincente is her psychiatrist Medications: she is prescribed Adderall, Valium  5 mg BID, and Prisiq. She was restarted on Effexor  XR 150 mg qdaily due to Pristiq  being nonformulary Prior inpatient admission: denies Suicide attempts: denies Family History: denies Social History: patient lives with her parents, works as Public house manager Tobacco Cessation:  A prescription for an FDA-approved tobacco cessation medication provided at discharge  Current Medications:  Current Facility-Administered Medications  Medication Dose Route Frequency Provider Last Rate Last Admin   acamprosate  (CAMPRAL ) tablet 666 mg  666 mg Oral TID Menna Abeln, MD   666 mg at 01/04/24 0930   alum & mag hydroxide-simeth (MAALOX/MYLANTA) 200-200-20 MG/5ML suspension 30 mL  30 mL Oral Q4H PRN Olasunkanmi, Oluwatosin, NP       haloperidol  (HALDOL ) tablet 5 mg  5 mg Oral TID PRN Olasunkanmi, Oluwatosin, NP       And   diphenhydrAMINE  (BENADRYL ) capsule 50 mg  50 mg Oral TID PRN Olasunkanmi, Oluwatosin, NP       haloperidol  lactate (HALDOL ) injection 5 mg  5 mg Intramuscular TID PRN Olasunkanmi, Oluwatosin, NP       And   diphenhydrAMINE  (BENADRYL ) injection 50 mg  50 mg Intramuscular TID PRN Olasunkanmi, Oluwatosin, NP       And   LORazepam  (ATIVAN ) injection 2 mg  2 mg Intramuscular TID PRN Olasunkanmi,  Oluwatosin, NP       haloperidol  lactate (HALDOL ) injection 10 mg  10 mg Intramuscular TID PRN Olasunkanmi, Oluwatosin, NP       And   diphenhydrAMINE  (BENADRYL ) injection 50 mg  50 mg Intramuscular TID PRN Olasunkanmi, Oluwatosin, NP       And   LORazepam  (ATIVAN ) injection 2 mg  2 mg Intramuscular TID PRN Olasunkanmi, Oluwatosin, NP       divalproex  (DEPAKOTE ) DR tablet 750 mg  750 mg Oral Q12H Jamisen Duerson, MD   750 mg at 01/04/24 0930   fluticasone  (FLONASE ) 50 MCG/ACT nasal spray 2 spray  2 spray Each Nare Daily PRN Braylee Lal, MD       ibuprofen  (ADVIL ) tablet 400 mg  400 mg Oral Q6H PRN Trinity Haun, MD   400 mg at 01/03/24 0902   levothyroxine  (SYNTHROID ) tablet 150 mcg  150 mcg Oral Q0600 Olasunkanmi, Oluwatosin, NP   150 mcg at 01/04/24 0546   loratadine  (CLARITIN ) tablet 10 mg  10 mg Oral Daily PRN Annasofia Pohl, MD       magnesium  hydroxide (MILK OF MAGNESIA) suspension 30 mL  30 mL Oral Daily PRN Olasunkanmi, Oluwatosin, NP       nicotine  (NICODERM CQ  - dosed in mg/24 hours) patch 21 mg  21 mg Transdermal Q0600 Olasunkanmi, Oluwatosin, NP   21 mg at 01/04/24 0544   nicotine  polacrilex (NICORETTE ) gum 2 mg  2 mg Oral PRN Navneet Schmuck, MD   2 mg at 01/03/24 1719   pantoprazole  (PROTONIX ) EC tablet 80 mg  80 mg Oral Daily Symantha Steeber,  Carra Brindley, MD   80 mg at 01/04/24 0931   thiamine  (VITAMIN B1) tablet 100 mg  100 mg Oral Daily Olasunkanmi, Oluwatosin, NP   100 mg at 01/04/24 0931   traZODone  (DESYREL ) tablet 50 mg  50 mg Oral QHS PRN Delshawn Stech, MD   50 mg at 01/03/24 2108   venlafaxine  XR (EFFEXOR -XR) 24 hr capsule 150 mg  150 mg Oral Q breakfast Olasunkanmi, Oluwatosin, NP   150 mg at 01/04/24 0931   Current Outpatient Medications  Medication Sig Dispense Refill   amphetamine-dextroamphetamine (ADDERALL XR) 10 MG 24 hr capsule Take 10 mg by mouth daily at 12 noon.     amphetamine-dextroamphetamine (ADDERALL XR) 20 MG 24 hr capsule Take 20 mg by mouth every morning.      desvenlafaxine  (PRISTIQ ) 100 MG 24 hr tablet Take 100 mg by mouth daily.     diazepam  (VALIUM ) 5 MG tablet Take 5 mg by mouth 2 (two) times daily as needed for anxiety.     fluticasone  (FLONASE ) 50 MCG/ACT nasal spray Place 2 sprays into both nostrils daily as needed for allergies.     Levothyroxine  Sodium 150 MCG CAPS Take  1 tablet  Daily  on an empty stomach with only water for 30 minutes & no Antacid meds, Calcium  or Magnesium  for 4 hours & avoid Biotin                                                                                                                                  /                                                                                 TAKE                                                         BY                                                        MOUTH (Patient taking differently: Take 150 mcg by mouth daily. Take on an empty stomach with only water for 30 minutes & no Antacid meds, Calcium  or Magnesium  for 4 hours & avoid Biotin) 90 capsule 3   loratadine  (CLARITIN ) 10 MG tablet Take 10 mg by  mouth daily as needed for allergies.     naproxen sodium (ALEVE) 220 MG tablet Take 440 mg by mouth 2 (two) times daily as needed (For pain).     traZODone  (DESYREL ) 50 MG tablet Take 1 tablet at Bedtime for Sleep (Patient taking differently: Take 50 mg by mouth at bedtime as needed for sleep.) 90 tablet 3   Esketamine HCl, 84 MG Dose, (SPRAVATO, 84 MG DOSE,) 28 MG/DEVICE SOPK Place 1 spray into the nose as needed (For depression). (Patient not taking: Reported on 01/01/2024)      PTA Medications:  Facility Ordered Medications  Medication   alum & mag hydroxide-simeth (MAALOX/MYLANTA) 200-200-20 MG/5ML suspension 30 mL   magnesium  hydroxide (MILK OF MAGNESIA) suspension 30 mL   haloperidol  (HALDOL ) tablet 5 mg   And   diphenhydrAMINE  (BENADRYL ) capsule 50 mg   haloperidol  lactate (HALDOL ) injection 5 mg   And   diphenhydrAMINE  (BENADRYL ) injection 50 mg   And   LORazepam   (ATIVAN ) injection 2 mg   haloperidol  lactate (HALDOL ) injection 10 mg   And   diphenhydrAMINE  (BENADRYL ) injection 50 mg   And   LORazepam  (ATIVAN ) injection 2 mg   nicotine  (NICODERM CQ  - dosed in mg/24 hours) patch 21 mg   [COMPLETED] thiamine  (VITAMIN B1) injection 100 mg   thiamine  (VITAMIN B1) tablet 100 mg   [EXPIRED] hydrOXYzine  (ATARAX ) tablet 25 mg   [EXPIRED] loperamide  (IMODIUM ) capsule 2-4 mg   [EXPIRED] ondansetron  (ZOFRAN -ODT) disintegrating tablet 4 mg   venlafaxine  XR (EFFEXOR -XR) 24 hr capsule 150 mg   levothyroxine  (SYNTHROID ) tablet 150 mcg   [COMPLETED] LORazepam  (ATIVAN ) tablet 1 mg   Followed by   [COMPLETED] LORazepam  (ATIVAN ) tablet 1 mg   Followed by   [COMPLETED] LORazepam  (ATIVAN ) tablet 1 mg   Followed by   [COMPLETED] LORazepam  (ATIVAN ) tablet 1 mg   loratadine  (CLARITIN ) tablet 10 mg   fluticasone  (FLONASE ) 50 MCG/ACT nasal spray 2 spray   pantoprazole  (PROTONIX ) EC tablet 80 mg   [COMPLETED] nicotine  polacrilex (NICORETTE ) gum 2 mg   traZODone  (DESYREL ) tablet 50 mg   ibuprofen  (ADVIL ) tablet 400 mg   acamprosate  (CAMPRAL ) tablet 666 mg   divalproex  (DEPAKOTE ) DR tablet 750 mg   nicotine  polacrilex (NICORETTE ) gum 2 mg   PTA Medications  Medication Sig   traZODone  (DESYREL ) 50 MG tablet Take 1 tablet at Bedtime for Sleep (Patient taking differently: Take 50 mg by mouth at bedtime as needed for sleep.)   diazepam  (VALIUM ) 5 MG tablet Take 5 mg by mouth 2 (two) times daily as needed for anxiety.   Levothyroxine  Sodium 150 MCG CAPS Take  1 tablet  Daily  on an empty stomach with only water for 30 minutes & no Antacid meds, Calcium  or Magnesium  for 4 hours & avoid Biotin                                                                                                                                  /  TAKE                                                         BY                                                         MOUTH (Patient taking differently: Take 150 mcg by mouth daily. Take on an empty stomach with only water for 30 minutes & no Antacid meds, Calcium  or Magnesium  for 4 hours & avoid Biotin)   naproxen sodium (ALEVE) 220 MG tablet Take 440 mg by mouth 2 (two) times daily as needed (For pain).   fluticasone  (FLONASE ) 50 MCG/ACT nasal spray Place 2 sprays into both nostrils daily as needed for allergies.   loratadine  (CLARITIN ) 10 MG tablet Take 10 mg by mouth daily as needed for allergies.   amphetamine-dextroamphetamine (ADDERALL XR) 10 MG 24 hr capsule Take 10 mg by mouth daily at 12 noon.   amphetamine-dextroamphetamine (ADDERALL XR) 20 MG 24 hr capsule Take 20 mg by mouth every morning.   desvenlafaxine  (PRISTIQ ) 100 MG 24 hr tablet Take 100 mg by mouth daily.   Esketamine HCl, 84 MG Dose, (SPRAVATO, 84 MG DOSE,) 28 MG/DEVICE SOPK Place 1 spray into the nose as needed (For depression). (Patient not taking: Reported on 01/01/2024)       01/04/2024    9:54 AM 01/03/2024   10:35 AM  Depression screen PHQ 2/9  Decreased Interest 0 0  Down, Depressed, Hopeless 0 0  PHQ - 2 Score 0 0    Flowsheet Row ED from 12/31/2023 in [id number] Most recent reading at 12/31/2023  6:13 PM ED from 12/31/2023 in [facility] Most recent reading at 12/31/2023  3:10 PM ED from 12/31/2023 in [facility] Most recent reading at 12/31/2023 11:32 AM  C-SSRS RISK CATEGORY No Risk No Risk No Risk    Musculoskeletal  Strength & Muscle Tone: within normal limits Gait & Station: normal Patient leans: N/A  Psychiatric Specialty Exam  Presentation  General Appearance:  Appropriate for Environment  Eye Contact: Fair  Speech: Clear and Coherent  Speech Volume: Normal  Handedness: Right   Mood and Affect  Mood: Euthymic  Affect: Appropriate   Thought Process  Thought  Processes: Coherent  Descriptions of Associations:Intact  Orientation:Full (Time, Place and Person)  Thought Content:Logical  Diagnosis of Schizophrenia or Schizoaffective disorder in past: No    Hallucinations:Hallucinations: None  Ideas of Reference:None  Suicidal Thoughts:Suicidal Thoughts: No  Homicidal Thoughts:Homicidal Thoughts: No   Sensorium  Memory: Immediate Fair  Judgment: Fair  Insight: Fair   Chartered certified accountant: Fair  Attention Span: Fair  Recall: [employer] of Knowledge: Fair  Language: Fair   Psychomotor Activity  Psychomotor Activity: Psychomotor Activity: Normal   Assets  Assets: Communication Skills; Desire for Improvement; Housing; Physical Health; Resilience; Social Support   Sleep  Sleep: Sleep: Fair  Estimated Sleeping Duration (Last 24 Hours): 10.25-11.25 hours  No data recorded  Physical Exam  Physical exam:  General: Well developed, well nourished, Caucasian female Pupils: Normal at 3mm  Respiratory: Breathing is unlabored.  Cardiovascular: No edema.  Language: No anomia, no aphasia Muscle strength and tone-pt moving all extremities.  Gait not assessed as pt remained in bed.  Neuro: Facial muscles are symmetric. Pt without tremor, no evidence of hyperarousal.    Review of Systems  Constitutional: Negative.   HENT: Negative.    Eyes: Negative.   Respiratory: Negative.    Cardiovascular: Negative.   Gastrointestinal: Negative.   Genitourinary: Negative.   Musculoskeletal: Negative.   Skin: Negative.   Neurological: Negative.   Endo/Heme/Allergies: Negative.   Psychiatric/Behavioral:  negative for depression and positive substance abuse. The patient denies feeling nervous/anxious and denies insomnia today   Blood pressure 129/88, pulse 83, temperature 98.6 F (37 C), temperature source Oral, resp. rate 18, last menstrual period 08/23/2020, SpO2 100%. There is no height or weight on file to  calculate BMI.  Demographic Factors:  Low socioeconomic status  Loss Factors: Financial problems/change in socioeconomic status  Historical Factors: NA  Risk Reduction Factors:   Living with another person, especially a relative, Positive social support, Positive therapeutic relationship, and Positive coping skills or problem solving skills  Continued Clinical Symptoms:  Alcohol/Substance Abuse/Dependencies  Cognitive Features That Contribute To Risk:  None    Suicide Risk:  Minimal: No identifiable suicidal ideation.  Patients presenting with no risk factors but with morbid ruminations; may be classified as minimal risk based on the severity of the depressive symptoms  Patient denies SI or HI for >48 hours. Denies wanting to be dead and future oriented. SRA complete and acute risk for suicide is low.   [place] Suicide Risk assessment:  1. Do you wish to be dead? NONE REPORTED 2. Have you wished your dead or wished you could go to sleep and not wake up? NONE REPORTED 3.  Have you actually had thoughts of killing yourself?  NONE REPORTED 4.  Have you been thinking about how you might do this?  NONE REPORTED 5.  Have you had these thoughts and some intention of acting on them? NONE REPORTED 6.  Have you started to work out or worked out the details to kill yourself? NONE REPORTED 7.  Do you intend to carry out this plan? NONE REPORTED 8. On a scale of 1-5 with 1 being the least severe and 5 being the most severe answer the following questions place for intensity of ideation. ZERO 9. How many times have you had these thoughts? NONE REPORTED 10. When you have the thoughts how long to the last?  NONE REPORTED 11. Control ability.  Could you or can you stop thinking about killing herself or wanting to die if you want to?  YES 12. Are there any things anyone or anything family religion pain of death that stop you from wanting to die or acting on thoughts of committing suicide?   FAMILY 13.  What sort of reason to do have to think about wanting to die or killing yourself? NONE REPORTED 14.Was it to end the pain or stop the way you are feeling in other words you could not go on living with his pain or how you are feeling or was not to get attention revenge or reaction from others?  Or both?  NONE REPORTED  [place] Suicide Risk assessment:  1. Do you wish to be dead? NONE REPORTED 2. Have you wished your dead or wished you could go to sleep and not wake up? NONE REPORTED 3.  Have you actually had thoughts of killing  yourself?  NONE REPORTED 4.  Have you been thinking about how you might do this?  NONE REPORTED 5.  Have you had these thoughts and some intention of acting on them? NONE REPORTED 6.  Have you started to work out or worked out the details to kill yourself? NONE REPORTED 7.  Do you intend to carry out this plan? NONE REPORTED 8. On a scale of 1-5 with 1 being the least severe and 5 being the most severe answer the following questions place for intensity of ideation. ZERO 9. How many times have you had these thoughts? NONE REPORTED 10. When you have the thoughts how long to the last?  NONE REPORTED 11. Control ability.  Could you or can you stop thinking about killing herself or wanting to die if you want to?  YES 12. Are there any things anyone or anything family religion pain of death that stop you from wanting to die or acting on thoughts of committing suicide?  FAMILY 13.  What sort of reason to do have to think about wanting to die or killing yourself? NONE REPORTED 14.Was it to end the pain or stop the way you are feeling in other words you could not go on living with his pain or how you are feeling or was not to get attention revenge or reaction from others?  Or both?  NONE REPORTED   Plan Of Care/Follow-up recommendations:  IOP Neurology followup and patient needs to establish with PCP  Disposition: home with parents  Yuri Flener, MD 01/04/2024,  9:54 AM

## 2024-01-04 NOTE — ED Notes (Signed)
 Patient A&Ox4. Denies intent to harm self/others when asked. Denies A/VH. Patient denies any physical complaints when asked. No acute distress noted. Support and encouragement provided. Routine safety checks conducted according to facility protocol. Encouraged patient to notify staff if thoughts of harm toward self or others arise. Patient verbalize understanding and agreement. Will continue to monitor for safety.

## 2024-01-04 NOTE — Group Note (Signed)
 Group Topic: Relapse and Recovery  Group Date: 01/04/2024 Start Time: 1015 End Time: 1023 Facilitators: Herold Lajuana NOVAK, RN  Department: [facility]  Number of Participants: 1  Group Focus: discharge education Treatment Modality:  Individual Therapy Interventions utilized were patient education Purpose: relapse prevention strategies  Name: Lisa [name] Date of Birth: [birth year]  MR: [id number]    Level of Participation: active Quality of Participation: cooperative Interactions with others: gave feedback Mood/Affect: appropriate Triggers (if applicable): none identified Cognition: coherent/clear Progress: Significant Response: verbalized agreement to remain compliant with IOP program and tx Plan: patient will be encouraged to remain compliant with tx program  Patients Problems:  Patient Active Problem List   Diagnosis Date Noted   Alcohol use disorder, moderate, dependence (HCC) 12/31/2023   Pelvic pain 06/22/2021   S/P laparoscopic hysterectomy 06/22/2021   Elevated LFTs 02/16/2021   Right foot pain 02/15/2021   Smoker 11/02/2020   Pulmonary emphysema (HCC) 03/15/2020   Iron  deficiency anemia 05/01/2017   Medication management 01/24/2016   Vitamin D  deficiency 12/13/2014   Hyperlipidemia LDL goal <70 12/13/2014   BMI 23.0-23.9, adult 09/07/2014   Anxiety    Dissociative identity disorder (HCC) 10/19/2011   History of gastric bypass (2012) 10/11/2011   Seizure disorder (HCC) 10/11/2011   Type 2 diabetes mellitus with hyperlipidemia (HCC) 10/11/2011   Hypothyroidism 10/11/2011   Panic attacks 10/11/2011   PTSD (post-traumatic stress disorder) 09/20/2011   Depression 08/17/2011

## 2024-01-04 NOTE — ED Notes (Signed)
 Patient A&O x 4, ambulatory. Patient discharged in no acute distress. Patient denied SI/HI, A/VH upon discharge. Patient verbalized understanding of all discharge instructions reviewed on AVS via staff, to include follow up appointments, RX's and safety. Suicide safety plan completed and reviewed with Clinical research associate. A copy given to pt. Patient reported mood 10/10.  Pt belongings returned to patient from locker #23 intact. Patient escorted to lobby via staff for transport to destination. Safety maintained.

## 2024-01-04 NOTE — ED Notes (Signed)
 Patient resting quietly in bed with eyes closed with unlabored breathing. Q 15 minute safety checks remain in place.  Pt remains safe on the unit at this time.

## 2024-01-09 ENCOUNTER — Ambulatory Visit (HOSPITAL_COMMUNITY)

## 2024-01-09 ENCOUNTER — Telehealth (HOSPITAL_COMMUNITY): Payer: Self-pay

## 2024-01-09 NOTE — Telephone Encounter (Signed)
 Lisa [name] calls and leaves a message last evening saying she is downstairs for the walk in substance use IOP and asked for a return call.  This therapist calls this a.m. and Lisa [name] answers. Therapist confirms her identity by obtaining two verifiers.  Therapist informs Lisa [name] she received her message this a.m., and noted there was not a walk-in IOP for substance use. Lisa [name] says she must have misunderstood what Lisa [name] told her.    Therapist explains each person seeking to be in CD-IOP needs an assessment to determine what program they may need.  Therapist informs her she has an appointment for an assessment at 10 am this morning. She says she will have to reschedule this appointment.  Lisa [name] says she prefers an evening appointment as she does not want to take off work.  Therapist inquires if she will be able to attend CD-IOP given it's hours of operation.  Lisa [name] says she thought it started at 6pm.  Therapist informs her if she needs an evening program, then she could call the [facility] as they have an evening program.  Therapist provides her with the phone number.  Lisa [name] asks to schedule for next Wednesday, just in case the [facility] does not work out. She says she may have to work something out. Therapist offers her a 1:00pm appointment and she accepts.  Therapist asks Lisa [name] to please call and cancel if the [facility] works out for her.  Lisa Simpler, MS, LMFT, LCAS

## 2024-01-15 ENCOUNTER — Telehealth (HOSPITAL_COMMUNITY): Payer: Self-pay

## 2024-01-15 ENCOUNTER — Ambulatory Visit (HOSPITAL_COMMUNITY): Payer: Self-pay

## 2024-01-15 ENCOUNTER — Ambulatory Visit (HOSPITAL_COMMUNITY)

## 2024-01-15 NOTE — Telephone Encounter (Signed)
 Therapist rosey Sor as she is a No show for her [facility] for CD IOP today.  She had said she needed an evening group and this therapist provided the name of the agency in the community that offers an evening group. Sor had then asked this therapist to make her an appointment so if that did not work out, she could work something out with her work to come to the [facility] CD IOP program. She indicated she would call and cancelled if she were able to get in the [facility]'s program.  Therapist reaches her voice mail and leaves a HIPAA compliant voice mail requesting a return call.  Darice Simpler, MS, LMFT, LCAS

## 2024-01-16 ENCOUNTER — Ambulatory Visit (HOSPITAL_COMMUNITY)

## 2024-04-03 ENCOUNTER — Encounter: Payer: Managed Care, Other (non HMO) | Admitting: Nurse Practitioner

## 2024-04-20 ENCOUNTER — Encounter: Payer: Self-pay | Admitting: Hematology

## 2024-04-20 ENCOUNTER — Encounter: Payer: Managed Care, Other (non HMO) | Admitting: Nurse Practitioner

## 2024-04-29 ENCOUNTER — Encounter: Payer: Self-pay | Admitting: Physician Assistant

## 2024-04-29 ENCOUNTER — Ambulatory Visit: Admitting: Physician Assistant

## 2024-04-29 VITALS — BP 130/86 | HR 107 | Temp 98.1°F | Ht 67.25 in | Wt 165.0 lb

## 2024-04-29 DIAGNOSIS — R040 Epistaxis: Secondary | ICD-10-CM | POA: Diagnosis not present

## 2024-04-29 DIAGNOSIS — E785 Hyperlipidemia, unspecified: Secondary | ICD-10-CM

## 2024-04-29 DIAGNOSIS — E1169 Type 2 diabetes mellitus with other specified complication: Secondary | ICD-10-CM

## 2024-04-29 DIAGNOSIS — D509 Iron deficiency anemia, unspecified: Secondary | ICD-10-CM

## 2024-04-29 DIAGNOSIS — E039 Hypothyroidism, unspecified: Secondary | ICD-10-CM | POA: Diagnosis not present

## 2024-04-29 DIAGNOSIS — F411 Generalized anxiety disorder: Secondary | ICD-10-CM

## 2024-04-29 DIAGNOSIS — F331 Major depressive disorder, recurrent, moderate: Secondary | ICD-10-CM

## 2024-04-29 DIAGNOSIS — G43009 Migraine without aura, not intractable, without status migrainosus: Secondary | ICD-10-CM

## 2024-04-29 DIAGNOSIS — Z23 Encounter for immunization: Secondary | ICD-10-CM | POA: Diagnosis not present

## 2024-04-29 DIAGNOSIS — Z9884 Bariatric surgery status: Secondary | ICD-10-CM

## 2024-04-29 DIAGNOSIS — F1020 Alcohol dependence, uncomplicated: Secondary | ICD-10-CM

## 2024-04-29 DIAGNOSIS — G40309 Generalized idiopathic epilepsy and epileptic syndromes, not intractable, without status epilepticus: Secondary | ICD-10-CM

## 2024-04-29 DIAGNOSIS — Z832 Family history of diseases of the blood and blood-forming organs and certain disorders involving the immune mechanism: Secondary | ICD-10-CM

## 2024-04-29 DIAGNOSIS — Z1211 Encounter for screening for malignant neoplasm of colon: Secondary | ICD-10-CM

## 2024-04-29 DIAGNOSIS — G43909 Migraine, unspecified, not intractable, without status migrainosus: Secondary | ICD-10-CM | POA: Insufficient documentation

## 2024-04-29 DIAGNOSIS — K9049 Malabsorption due to intolerance, not elsewhere classified: Secondary | ICD-10-CM

## 2024-04-29 DIAGNOSIS — Z720 Tobacco use: Secondary | ICD-10-CM

## 2024-04-29 DIAGNOSIS — F4310 Post-traumatic stress disorder, unspecified: Secondary | ICD-10-CM

## 2024-04-29 DIAGNOSIS — F410 Panic disorder [episodic paroxysmal anxiety] without agoraphobia: Secondary | ICD-10-CM

## 2024-04-29 MED ORDER — CYCLOBENZAPRINE HCL 5 MG PO TABS: 5.0000 mg | ORAL_TABLET | Freq: Three times a day (TID) | ORAL | 1 refills | Status: AC | PRN

## 2024-04-29 MED ORDER — TIRZEPATIDE 2.5 MG/0.5ML ~~LOC~~ SOAJ: 2.5000 mg | SUBCUTANEOUS | 1 refills | Status: DC

## 2024-04-29 NOTE — Patient Instructions (Signed)
 It was great to see you!  Consider getting eye exam Trial Nurtec for migraines Refill flexeril  for migraines Message me about nosebleeds  Take thyroid  medication consistently and then get your labs done -- I will be in touch with results  Trial nasal saline gel  Referral for colonoscopy placed  Mounjaro sent to pharmacy    Let's follow-up in 2 months, sooner if you have concerns.  Take care,  Lucie Buttner PA-C

## 2024-04-29 NOTE — Progress Notes (Signed)
 Lisa [name] is a 48 y.o. female here to establish care and discuss management of chronic medical issues.  History of Present Illness:   Chief Complaint  Patient presents with   Establish Care   Medical Management of Chronic Issues    Pt has concerns about insomnia, stomach issues, Migraines, blood nose.    Discussed the use of AI scribe software for clinical note transcription with the patient, who gave verbal consent to proceed.  History of Present Illness   Lisa [name] is a 48 year old female who presents for evaluation of weight management, seizures, and mental health concerns.  She underwent Roux-en-Y gastric bypass surgery in 2012. Her highest weight prior to surgery was approx 275 pounds. She has history of DM. Most recently she took Ozempic , reaching 120 pounds. Her weight has since increased to approximately 165 pounds. She uses food as a coping mechanism after stopping alcohol consumption, resulting in a weight gain of 15-20 pounds. She has been without alcohol for > 40 days.  She has epilepsy, diagnosed at age 59, with no family history. Initially experiencing petit mal seizures triggered by lack of sleep, stress, or anxiety, she previously had grand mal seizures but not frequently now. She is on Depakote  and has not seen her neurologist recently.  She has hypothyroidism, diagnosed in high school, and takes levothyroxine  150 mcg daily. There is a family history of thyroid  issues. She has been more consistent with her medication recently. Her last TSH was checked in July and was around 30 -- she states that she was not consistently taking medication at that time.  She experiences migraines, sometimes presenting as sinus headaches or migraines with nausea, light, and sound sensitivity. She has used Flexeril  and sumatriptan  in the past, which helped but caused fatigue.   She has depression, anxiety, PTSD, and panic attacks. She sees a psychiatrist and a therapist for management.  She has used ketamine for seasonal affective disorder and depression. She abstains from alcohol for 2-3 weeks before a binge and is working on breaking this cycle. She was previously on acamprosate  for alcohol dependence but is currently out of the medication. She currently sees Dr Vincente for medication management and a therapist 1-2 times per week.  She reports frequent epistaxis, recently when her parents were away, lasting more than 15 minutes and affecting both nostrils. She has a history of easy bruising and anemia. She uses a humidifier and saline spray for dry nasal passages.  She reports dietary issues post-gastric bypass, with certain red meats and seafood causing gastrointestinal discomfort.      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   Depression    Diabetes mellitus without complication Type 2 diet controlled    Epilepsy (HCC)    dx age 32   Headache(784.0)    Hypothyroidism    Infertility associated with anovulation    Seasonal allergies 08/09/2021   Seizures (HCC)    spring 2022 twitching of right hand only     Social History   Tobacco Use   Smoking status: Every Day    Current packs/day: 0.50    Average packs/day: 0.5 packs/day for 5.0 years (2.5 ttl pk-yrs)  Types: Cigarettes   Smokeless tobacco: Never  Vaping Use   Vaping status: Never Used  Substance Use Topics   Alcohol use: Not Currently    Comment: Hx Alcoholic   Drug use: Not Currently    Types: Marijuana    Past Surgical History:  Procedure Laterality Date   CLAVICLE SURGERY Right 2022   Plate and screws   DILATION AND CURETTAGE OF UTERUS     yrs ago per pt on 08-09-2021   GASTRIC BYPASS  06/25/2010   Roux-n-Y -- highest weight around 275 lb    LAPAROSCOPY N/A 08/14/2021   Procedure: LAPAROSCOPY DIAGNOSTIC;  Surgeon: Bettina Muskrat, MD;  Location: [facility] [place];  Service: Gynecology;  Laterality: N/A;   REPAIR VAGINAL CUFF N/A 08/14/2021   Procedure: SECONDARY CLOSURE OF VAGINAL CUFF DEHISCENCE;  Surgeon: Bettina Muskrat, MD;  Location: [facility] [place];  Service: Gynecology;  Laterality: N/A;   TOTAL LAPAROSCOPIC HYSTERECTOMY WITH SALPINGECTOMY N/A 06/22/2021   Procedure: TOTAL LAPAROSCOPIC HYSTERECTOMY WITH BILATERAL SALPINGECTOMY;  Surgeon: Bettina Muskrat, MD;  Location: [facility];  Service: Gynecology;  Laterality: N/A;   WISDOM TOOTH EXTRACTION     as teenager    Family History  Problem Relation Age of Onset   Depression Mother    Diabetes Mother    Thalassemia Father    Anorexia nervosa Sister    Depression Maternal Grandmother    Alzheimer's disease Maternal Grandmother    Diabetes Maternal Grandmother    Alzheimer's disease Maternal Grandfather    Alzheimer's disease Paternal Grandmother    Alcohol abuse Maternal Uncle    Anxiety disorder Maternal Uncle    Depression Maternal Uncle     Allergies  Allergen Reactions   Injectafer  [Ferric Carboxymaltose ] Other (See Comments)    Pt had hypersensitivity rxn to Injectafer . See progress note from 12/15/21 at 1011. Pt able to complete infusion.    Venofer  [Iron  Sucrose] Anaphylaxis, Itching, Nausea Only, Other (See Comments) and Cough    Reaction to Venofer . See progress note 12/08/21. Unable to complete infusion. Extreme flushing; itching to body and throat, severe nausea.   Mango Flavor [Flavoring Agent] Itching   Codeine  Itching, Rash and Other (See Comments)    OPIOIDS - MORPHINE & RELATED CAN TAKE TRAMADOL  CAN TAKE DEMEROL CAN TAKE    Current Medications:   Current Outpatient Medications:    cyclobenzaprine  (FLEXERIL ) 5 MG tablet, Take 1 tablet (5 mg total) by mouth 3 (three) times daily as needed for muscle spasms., Disp: 30 tablet, Rfl: 1    desvenlafaxine  (PRISTIQ ) 100 MG 24 hr tablet, Take 100 mg by mouth daily., Disp: , Rfl:    diazepam  (VALIUM ) 5 MG tablet, Take 5 mg by mouth every 6 (six) hours as needed for anxiety., Disp: , Rfl:    divalproex  (DEPAKOTE ) 250 MG DR tablet, Take 3 tablets (750 mg total) by mouth every 12 (twelve) hours., Disp: 180 tablet, Rfl: 0   Ferrous Sulfate (IRON ) 325 (65 Fe) MG TABS, Take 1 tablet by mouth daily in the afternoon., Disp: , Rfl:    fluticasone  (FLONASE ) 50 MCG/ACT nasal spray, Place 2 sprays into both nostrils daily as needed for allergies., Disp: , Rfl:    Levothyroxine  Sodium 150 MCG CAPS, Take  1 tablet  Daily  on an empty stomach with only water for 30 minutes & no Antacid meds, Calcium  or Magnesium  for 4 hours & avoid Biotin                                                                                                                                  /  TAKE                                                         BY                                                        MOUTH (Patient taking differently: Take 150 mcg by mouth daily. Take on an empty stomach with only water for 30 minutes & no Antacid meds, Calcium  or Magnesium  for 4 hours & avoid Biotin), Disp: 90 capsule, Rfl: 3   loratadine  (CLARITIN ) 10 MG tablet, Take 10 mg by mouth daily as needed for allergies., Disp: , Rfl:    MAGNESIUM  PO, Take 1 tablet by mouth daily in the afternoon., Disp: , Rfl:    Multiple Vitamin (MULTIVITAMIN) tablet, Take 1 tablet by mouth daily., Disp: , Rfl:    naproxen sodium (ALEVE) 220 MG tablet, Take 440 mg by mouth 2 (two) times daily as needed (For pain)., Disp: , Rfl:    pantoprazole  (PROTONIX ) 40 MG tablet, Take 2 tablets (80 mg total) by mouth daily., Disp: 60 tablet, Rfl: 0   tirzepatide (MOUNJARO) 2.5 MG/0.5ML Pen, Inject 2.5 mg into the skin once a week., Disp: 2 mL, Rfl: 1   traZODone  (DESYREL ) 50 MG tablet, Take 1  tablet (50 mg total) by mouth at bedtime as needed for sleep., Disp: 30 tablet, Rfl: 0   Review of Systems:   Negative unless otherwise specified per HPI.  Vitals:   Vitals:   04/29/24 1353  BP: 130/86  Pulse: (!) 107  Temp: 98.1 F (36.7 C)  TempSrc: Temporal  SpO2: 99%  Weight: 165 lb (74.8 kg)  Height: 5' 7.25 (1.708 m)     Body mass index is 25.65 kg/m.  Physical Exam:   Physical Exam Vitals and nursing note reviewed.  Constitutional:      General: She is not in acute distress.    Appearance: She is well-developed. She is not ill-appearing or toxic-appearing.  Cardiovascular:     Rate and Rhythm: Normal rate and regular rhythm.     Pulses: Normal pulses.     Heart sounds: Normal heart sounds, S1 normal and S2 normal.  Pulmonary:     Effort: Pulmonary effort is normal.     Breath sounds: Normal breath sounds.  Skin:    General: Skin is warm and dry.  Neurological:     Mental Status: She is alert.     GCS: GCS eye subscore is 4. GCS verbal subscore is 5. GCS motor subscore is 6.  Psychiatric:        Speech: Speech normal.        Behavior: Behavior normal. Behavior is cooperative.     Assessment and Plan:   Assessment and Plan    Generalized idiopathic epilepsy and epileptic syndromes, not intractable, without status epilepticus (HCC)  Seizure management with Depakote . Stress and lack of sleep can trigger petit mal seizures. - Continue Depakote  as prescribed. - Follow up with neurologist for ongoing management.  Hypothyroidism Recent labs suggest underactive thyroid , possible dose adjustment needed. Reports dry mouth and lips. - Ordered TSH and thyroid   function tests. - Ensure consistent daily intake of levothyroxine .  Moderate episode of recurrent major depressive disorder (HCC), generalized anxiety disorder, panic disorder, and post-traumatic stress disorder Managed with Pristiq  and Spravato. Seasonal affective disorder and insomnia present. Ongoing  anxiety, depression, panic attacks, and PTSD symptoms. - Continue ketamine treatment seasonally. - Continue therapy with Randine Collet.  Alcohol use disorder, moderate, dependence (HCC)  Recent sobriety of 43 days. Stress triggers drinking. Discussed acamprosate  use. - Consulted with Dr. Vincente regarding resuming acamprosate . - Encouraged continued sobriety and stress management.  Migraine without aura and without status migrainosus, not intractable  Intermittent migraines with nausea, light, and sound sensitivity. Discussed newer migraine medications. - Refilled Flexeril  10 mg. - Provided sample of Nurtec for migraine management. - Consider newer migraine medications if Nurtec is effective.  Epistaxis Recurrent, possibly related to dry nasal passages and antihistamine use. Discussed nasal saline gel and potential ENT referral. - Recommended nasal saline gel ([place]) to maintain nasal moisture. - Will consider ENT referral if epistaxis persists.  Iron  deficiency anemia  History possibly related to gastric bypass and malabsorption. Reports bruising and fatigue. Discussed B12 supplementation and colonoscopy. - Ordered complete blood count and iron  studies. - Will consider B12 supplementation based on lab results. - Referred to gastroenterology for colonoscopy to rule out GI bleeding.  Tobacco abuse -Will consider Chantix  in the future, not ready to quit at this time  History of Roux-en-Y gastric bypass Weight fluctuations with recent gain. Discussed Mounjaro for weight management and stress-related eating. - Update B12, vitamin D , other vitamin/mineral levels for further evaluation - Low threshold to resume B12 injections - these have been helpful in the past - Encouraged continued exercise and healthy eating habits, regular intake of bariatric vitamin   Type 2 diabetes mellitus with hyperlipidemia (HCC)  Update Hemoglobin A1c Start 2.5 mg weekly Mounjaro to help with cravings of  alcohol, tobacco and for diabetes mellitus management  Family history of thalassemia - Will order appropriate testing per patient request  Food intolerance - Will order alpha-gal panel, per patient request  General Health Maintenance Due for colon cancer screening and mammogram. Discussed importance of regular screenings and eye exam. - Ordered colonoscopy referral. - Encouraged mammogram and eye exam.      Follow-up in 1-3 month(s)   I personally spent a total of 72 minutes in the care of the patient today including preparing to see the patient, getting/reviewing separately obtained history, performing a medically appropriate exam/evaluation, counseling and educating, placing orders, documenting clinical information in the EHR, independently interpreting results, and communicating results.   Lucie Buttner, PA-C

## 2024-04-30 ENCOUNTER — Other Ambulatory Visit: Payer: Self-pay | Admitting: *Deleted

## 2024-04-30 MED ORDER — TIRZEPATIDE 2.5 MG/0.5ML ~~LOC~~ SOAJ: 2.5000 mg | SUBCUTANEOUS | 1 refills | Status: AC

## 2024-07-01 ENCOUNTER — Ambulatory Visit: Admitting: Physician Assistant
